# Patient Record
Sex: Male | Born: 1960
Health system: Southern US, Community
[De-identification: ages and names within clinical notes are randomized; demographics above are authoritative.]

## PROBLEM LIST (undated history)

## (undated) ENCOUNTER — Inpatient Hospital Stay: Admission: EM | Payer: Self-pay | Source: Home / Self Care

## (undated) DIAGNOSIS — Z9889 Other specified postprocedural states: Secondary | ICD-10-CM

## (undated) DIAGNOSIS — J301 Allergic rhinitis due to pollen: Secondary | ICD-10-CM

## (undated) DIAGNOSIS — T7840XA Allergy, unspecified, initial encounter: Secondary | ICD-10-CM

## (undated) DIAGNOSIS — Z800 Family history of malignant neoplasm of digestive organs: Secondary | ICD-10-CM

## (undated) DIAGNOSIS — J449 Chronic obstructive pulmonary disease, unspecified: Secondary | ICD-10-CM

## (undated) DIAGNOSIS — R112 Nausea with vomiting, unspecified: Secondary | ICD-10-CM

## (undated) DIAGNOSIS — M1990 Unspecified osteoarthritis, unspecified site: Secondary | ICD-10-CM

## (undated) DIAGNOSIS — K921 Melena: Secondary | ICD-10-CM

## (undated) DIAGNOSIS — E119 Type 2 diabetes mellitus without complications: Secondary | ICD-10-CM

## (undated) DIAGNOSIS — Z803 Family history of malignant neoplasm of breast: Secondary | ICD-10-CM

## (undated) DIAGNOSIS — Z8739 Personal history of other diseases of the musculoskeletal system and connective tissue: Secondary | ICD-10-CM

## (undated) DIAGNOSIS — K219 Gastro-esophageal reflux disease without esophagitis: Secondary | ICD-10-CM

## (undated) DIAGNOSIS — G43909 Migraine, unspecified, not intractable, without status migrainosus: Secondary | ICD-10-CM

## (undated) DIAGNOSIS — Z860100 Personal history of colon polyps, unspecified: Secondary | ICD-10-CM

## (undated) HISTORY — DX: Family history of malignant neoplasm of breast: Z80.3

## (undated) HISTORY — DX: Type 2 diabetes mellitus without complications: E11.9

## (undated) HISTORY — DX: Allergic rhinitis due to pollen: J30.1

## (undated) HISTORY — DX: Migraine, unspecified, not intractable, without status migrainosus: G43.909

## (undated) HISTORY — DX: Personal history of colonic polyps: Z86.010

## (undated) HISTORY — PX: KNEE ARTHROSCOPY: SUR90

## (undated) HISTORY — PX: COLONOSCOPY: SHX174

## (undated) HISTORY — DX: Family history of malignant neoplasm of digestive organs: Z80.0

## (undated) HISTORY — PX: WISDOM TOOTH EXTRACTION: SHX21

## (undated) HISTORY — DX: Allergy, unspecified, initial encounter: T78.40XA

## (undated) HISTORY — DX: Gastro-esophageal reflux disease without esophagitis: K21.9

## (undated) HISTORY — DX: Personal history of colon polyps, unspecified: Z86.0100

## (undated) HISTORY — DX: Melena: K92.1

---

## 1982-06-26 HISTORY — PX: APPENDECTOMY: SHX54

## 2001-08-20 ENCOUNTER — Ambulatory Visit (HOSPITAL_COMMUNITY): Admission: RE | Admit: 2001-08-20 | Discharge: 2001-08-20 | Payer: Self-pay | Admitting: Internal Medicine

## 2003-07-23 ENCOUNTER — Emergency Department (HOSPITAL_COMMUNITY): Admission: EM | Admit: 2003-07-23 | Discharge: 2003-07-23 | Payer: Self-pay | Admitting: Emergency Medicine

## 2006-06-26 HISTORY — PX: NASAL SEPTUM SURGERY: SHX37

## 2008-12-16 ENCOUNTER — Encounter: Payer: Self-pay | Admitting: Family Medicine

## 2010-01-12 ENCOUNTER — Ambulatory Visit: Payer: Self-pay | Admitting: Family Medicine

## 2010-01-12 DIAGNOSIS — Z860100 Personal history of colon polyps, unspecified: Secondary | ICD-10-CM | POA: Insufficient documentation

## 2010-01-12 DIAGNOSIS — J45909 Unspecified asthma, uncomplicated: Secondary | ICD-10-CM | POA: Insufficient documentation

## 2010-01-12 DIAGNOSIS — K219 Gastro-esophageal reflux disease without esophagitis: Secondary | ICD-10-CM

## 2010-01-13 LAB — CONVERTED CEMR LAB
AST: 32 units/L (ref 0–37)
Albumin: 4.5 g/dL (ref 3.5–5.2)
Alkaline Phosphatase: 73 units/L (ref 39–117)
Basophils Relative: 2.1 % (ref 0.0–3.0)
CO2: 26 meq/L (ref 19–32)
Calcium: 9.4 mg/dL (ref 8.4–10.5)
Eosinophils Absolute: 0.2 10*3/uL (ref 0.0–0.7)
Glucose, Bld: 103 mg/dL — ABNORMAL HIGH (ref 70–99)
HCT: 46.1 % (ref 39.0–52.0)
Hemoglobin: 16 g/dL (ref 13.0–17.0)
Lymphocytes Relative: 27.9 % (ref 12.0–46.0)
Lymphs Abs: 1.4 10*3/uL (ref 0.7–4.0)
MCHC: 34.8 g/dL (ref 30.0–36.0)
Monocytes Relative: 9 % (ref 3.0–12.0)
Neutro Abs: 3 10*3/uL (ref 1.4–7.7)
Potassium: 4.5 meq/L (ref 3.5–5.1)
RBC: 5.11 M/uL (ref 4.22–5.81)
Sodium: 136 meq/L (ref 135–145)
TSH: 0.45 microintl units/mL (ref 0.35–5.50)
Total CHOL/HDL Ratio: 6
Total Protein: 7.5 g/dL (ref 6.0–8.3)
Triglycerides: 322 mg/dL — ABNORMAL HIGH (ref 0.0–149.0)

## 2010-02-24 ENCOUNTER — Encounter: Payer: Self-pay | Admitting: Internal Medicine

## 2010-02-25 ENCOUNTER — Ambulatory Visit: Payer: Self-pay | Admitting: Internal Medicine

## 2010-02-25 ENCOUNTER — Ambulatory Visit (HOSPITAL_COMMUNITY): Admission: RE | Admit: 2010-02-25 | Discharge: 2010-02-25 | Payer: Self-pay | Admitting: Internal Medicine

## 2010-03-01 ENCOUNTER — Encounter: Payer: Self-pay | Admitting: Internal Medicine

## 2010-07-26 NOTE — Letter (Signed)
Summary: Records from [facility]   Records from [facility]   Imported By: Maryln Gottron 02/02/2010 12:25:47  _____________________________________________________________________  External Attachment:    Type:   Image     Comment:   External Document

## 2010-07-26 NOTE — Letter (Signed)
Summary: Patient Notice, Colon Biopsy Results  [facility]  [address]   [place], [place] [address]   Phone: [phone]  Fax: [phone]       March 01, 2010   ABDULLA [name] [address] [place], [place]  [address] 02-Jan-1961    Dear [name],  I am pleased to inform you that the biopsies taken during your recent colonoscopy did not show any evidence of cancer upon pathologic examination.  Additional information/recommendations:  You should have a repeat colonoscopy examination  in 5 years.  Please call us if you are having persistent problems or have questions about your condition that have not been fully answered at this time.  Sincerely,    R. Roetta Sessions MD, FACP [facility] Ph: [phone]    Fax: [phone]   Appended Document: Patient Notice, Colon Biopsy Results Letter mailed to pt.  Appended Document: Patient Notice, Colon Biopsy Results Reminder placed in idx.

## 2010-07-26 NOTE — Assessment & Plan Note (Signed)
Summary: BRAND NEW PT/TO EST/PT REQ CPX/COMING IN FASTING/CJR   Vital Signs:  Patient profile:   50 year old male Height:      73.25 inches Weight:      306 pounds BMI:     40.24 Temp:     97.8 degrees F oral Pulse rate:   80 / minute Pulse rhythm:   regular Resp:     12 per minute BP sitting:   120 / 88  (left arm) Cuff size:   large  Vitals Entered By: Sid Falcon LPN (January 12, 2010 10:26 AM)  Nutrition Counseling: Patient's BMI is greater than 25 and therefore counseled on weight management options. CC: New to establish, fasting for labs   History of Present Illness: Patient here to establish care and for complete physical examination.  Past medical history reviewed. History of rare migraine headaches. Reported history of colon polyps. Positive family history of colon cancer in his mother. History of reflux and he takes omeprazole irregularly. Reported history of asthma in childhood. Possibly mild intermittent asthma now. Prior appendectomy 1984. Seasonal allergic rhinitis.  Family history significant for mother colon cancer and breast cancer. Father with type 2 diabetes.  Patient is divorced. Nonsmoker. Occasional alcohol use. Works in [employer]  Alcohol-Tobacco     Smoking Status: quit > 6 months     Year Started: 1975     Year Quit: 1985  Caffeine-Diet-Exercise     Does Patient Exercise: no  Allergies (verified): No Known Drug Allergies  Past History:  Family History: Last updated: 01/12/2010 Mother colon CA, breast CA Maternal grandmother breast CA, colon CA Mothers brothers 6 out of 8 died of colon CA Father diabetes, type ll Paternal grandfather, diabetes brother, type ll diabetes  Social History: Last updated: 01/12/2010 Occupation: Divorced Alcohol use-yes Regular exercise-no past smoker  Risk Factors: Exercise: no (01/12/2010)  Risk Factors: Smoking Status: quit > 6 months  (01/12/2010)  Past Medical History: Asthma Colonic polyps, hx of GERD blood in stool hay fever/allergies migraines  Past Surgical History: Appendectomy  1984 Deviated septum 2008 PMH-FH-SH reviewed for relevance  Family History: Mother colon CA, breast CA Maternal grandmother breast CA, colon CA Mothers brothers 6 out of 8 died of colon CA Father diabetes, type ll Paternal grandfather, diabetes brother, type ll diabetes  Social History: Occupation: Divorced Alcohol use-yes Regular exercise-no past smoker Occupation:  employed Does Patient Exercise:  no Smoking Status:  quit > 6 months  Review of Systems  The patient denies anorexia, fever, weight loss, weight gain, vision loss, decreased hearing, hoarseness, chest pain, syncope, dyspnea on exertion, peripheral edema, prolonged cough, headaches, hemoptysis, abdominal pain, melena, hematochezia, severe indigestion/heartburn, hematuria, incontinence, genital sores, muscle weakness, suspicious skin lesions, transient blindness, difficulty walking, depression, unusual weight change, abnormal bleeding, enlarged lymph nodes, and testicular masses.         patient has some daytime fatigue. Frequent symptoms of dribbling urinary stream. Some nocturia. Frequent knee pain.  Physical Exam  General:  Well-developed,well-nourished,in no acute distress; alert,appropriate and cooperative throughout examination Head:  Normocephalic and atraumatic without obvious abnormalities. No apparent alopecia or balding. Eyes:  No corneal or conjunctival inflammation noted. EOMI. Perrla. Funduscopic exam benign, without hemorrhages, exudates or papilledema. Vision grossly normal. Ears:  External ear exam shows no significant lesions or deformities.  Otoscopic examination reveals clear canals, tympanic membranes are intact bilaterally without bulging, retraction, inflammation or discharge. Hearing is grossly normal bilaterally. Mouth:  Oral mucosa and  oropharynx without lesions or exudates.  Teeth in good repair. Neck:  No deformities, masses, or tenderness noted. Lungs:  Normal respiratory effort, chest expands symmetrically. Lungs are clear to auscultation, no crackles or wheezes. Heart:  Normal rate and regular rhythm. S1 and S2 normal without gallop, murmur, click, rub or other extra sounds. Abdomen:  Bowel sounds positive,abdomen soft and non-tender without masses, organomegaly or hernias noted. Rectal:  No external abnormalities noted. Normal sphincter tone. No rectal masses or tenderness. Prostate:  Prostate gland firm and smooth, no enlargement, nodularity, tenderness, mass, asymmetry or induration. Extremities:  No clubbing, cyanosis, edema, or deformity noted with normal full range of motion of all joints.   Neurologic:  alert & oriented X3, cranial nerves II-XII intact, strength normal in all extremities, and gait normal.   Skin:  multiple skin tags both axillary regions Cervical Nodes:  No lymphadenopathy noted Psych:  normally interactive, good eye contact, not anxious appearing, and not depressed appearing.     Impression & Recommendations:  Problem # 1:  Preventive Health Care (ICD-V70.0) obtain screening labs.  Work on weight loss.  Needs repeat colonoscopy (>5 years and VERY strong FH). Pt to schedule follow up for skin tag removal.  Complete Medication List: 1)  Omeprazole 40 Mg Cpdr (Omeprazole) .... One tab two times a day 2)  Allegra 180 Mg Tabs (Fexofenadine hcl) .... Once daily 3)  Fluticasone Propionate 50 Mcg/act Susp (Fluticasone propionate) .... Two puffs two times a day  Other Orders: TLB-Lipid Panel (80061-LIPID) TLB-BMP (Basic Metabolic Panel-BMET) (80048-METABOL) TLB-CBC Platelet - w/Differential (85025-CBCD) TLB-Hepatic/Liver Function Pnl (80076-HEPATIC) TLB-TSH (Thyroid Stimulating Hormone) (84443-TSH) TLB-PSA (Prostate Specific Antigen) (84153-PSA) Gastroenterology Referral (GI) Venipuncture  ([address]109) Specimen Handling (60454)  Patient Instructions: 1)  It is important that you exercise reguarly at least 20 minutes 5 times a week. If you develop chest pain, have severe difficulty breathing, or feel very tired, stop exercising immediately and seek medical attention.  2)  You need to lose weight. Consider a lower calorie diet and regular exercise.  3)  Schedule followup for skin tag removal  Preventive Care Screening  Last Tetanus Booster:    Date:  06/26/2006    Results:  Historical   Colonoscopy:    Date:  06/27/2003    Results:  Adenomatous Polyp

## 2010-07-26 NOTE — Letter (Signed)
Summary: [facility]  [facility]   Imported By: Cloria Spring LPN 04/54/0981 19:14:78  _____________________________________________________________________  External Attachment:    Type:   Image     Comment:   External Document

## 2010-10-21 ENCOUNTER — Encounter: Payer: Self-pay | Admitting: Family Medicine

## 2010-10-21 ENCOUNTER — Ambulatory Visit (INDEPENDENT_AMBULATORY_CARE_PROVIDER_SITE_OTHER): Payer: 59 | Admitting: Family Medicine

## 2010-10-21 VITALS — BP 122/86 | Temp 98.2°F | Wt 314.0 lb

## 2010-10-21 DIAGNOSIS — R1032 Left lower quadrant pain: Secondary | ICD-10-CM

## 2010-10-21 LAB — POCT URINALYSIS DIPSTICK
Bilirubin, UA: NEGATIVE
Blood, UA: NEGATIVE
Glucose, UA: NEGATIVE
Spec Grav, UA: 1.025

## 2010-10-21 LAB — CBC WITH DIFFERENTIAL/PLATELET
Basophils Relative: 1.7 % (ref 0.0–3.0)
Eosinophils Absolute: 0.2 10*3/uL (ref 0.0–0.7)
HCT: 42.8 % (ref 39.0–52.0)
Hemoglobin: 15 g/dL (ref 13.0–17.0)
Lymphs Abs: 1.6 10*3/uL (ref 0.7–4.0)
MCHC: 35.1 g/dL (ref 30.0–36.0)
MCV: 89.8 fl (ref 78.0–100.0)
Monocytes Absolute: 0.7 10*3/uL (ref 0.1–1.0)
Neutro Abs: 4.5 10*3/uL (ref 1.4–7.7)
RBC: 4.77 Mil/uL (ref 4.22–5.81)

## 2010-10-21 MED ORDER — CIPROFLOXACIN HCL 500 MG PO TABS: 500.0000 mg | ORAL_TABLET | Freq: Two times a day (BID) | ORAL | Status: AC

## 2010-10-21 MED ORDER — OMEPRAZOLE 40 MG PO CPDR: 40.0000 mg | DELAYED_RELEASE_CAPSULE | Freq: Every day | ORAL | Status: DC

## 2010-10-21 MED ORDER — METRONIDAZOLE 500 MG PO TABS: 500.0000 mg | ORAL_TABLET | Freq: Three times a day (TID) | ORAL | Status: AC

## 2010-10-21 NOTE — Progress Notes (Signed)
  Subjective:    Patient ID: [name] [name], male    DOB: 05/26/1961, 50 y.o.   MRN: [id number]  HPI Patient seen with abdominal pain left mid to lower quadrant and started a few days ago. Somewhat progressive. Had colonoscopy last September with some diverticular changes. No prior history of diverticulitis. He has some generalized soreness of moderate severity. No exacerbating features. Had low-grade fever yesterday by palpation but did not take temperature. Had occasional chills. No vomiting or diarrhea. No stool changes. No bloody stools.  Also his history of GERD. Requests refills Prilosec. Not currently on Prilosec and has had recurrent symptoms off medication. Denies any recent appetite or weight changes.   Review of Systems  Constitutional: Positive for fever, chills and fatigue.  Respiratory: Negative for cough and shortness of breath.   Cardiovascular: Negative for chest pain, palpitations and leg swelling.  Gastrointestinal: Positive for abdominal pain. Negative for nausea, vomiting, diarrhea, constipation, blood in stool and rectal pain.  Genitourinary: Negative for dysuria and hematuria.       Objective:   Physical Exam  Constitutional: He appears well-developed and well-nourished. No distress.  HENT:  Head: Normocephalic and atraumatic.  Mouth/Throat: Oropharynx is clear and moist. No oropharyngeal exudate.  Cardiovascular: Normal rate and normal heart sounds.   No murmur heard. Pulmonary/Chest: Effort normal and breath sounds normal. No respiratory distress. He has no wheezes. He has no rales.  Abdominal: Soft. Bowel sounds are normal. He exhibits no distension and no mass. There is tenderness. There is no rebound and no guarding.       Mild tenderness left mid to lower quadrant. No guarding or rebound.  Musculoskeletal: He exhibits no edema.          Assessment & Plan:  New onset left mid to lower quadrant abdominal pain. Suspect acute diverticulitis. Check CBC  and UA. Start Flagyl 500 mg 3 times a day and Cipro 500 mg twice a day for 10 days. Follow up promptly for any worsening pain, fever, or any new symptoms

## 2010-10-21 NOTE — Patient Instructions (Signed)
Follow up promptly for any worsening abdominal pain, vomiting, or increased fever.

## 2010-10-25 NOTE — Progress Notes (Signed)
Quick Note:  Pt informed ______ 

## 2011-05-23 ENCOUNTER — Encounter: Payer: Self-pay | Admitting: Family Medicine

## 2011-05-24 ENCOUNTER — Ambulatory Visit (INDEPENDENT_AMBULATORY_CARE_PROVIDER_SITE_OTHER): Payer: 59 | Admitting: Family Medicine

## 2011-05-24 ENCOUNTER — Encounter: Payer: Self-pay | Admitting: Family Medicine

## 2011-05-24 DIAGNOSIS — R05 Cough: Secondary | ICD-10-CM

## 2011-05-24 DIAGNOSIS — J309 Allergic rhinitis, unspecified: Secondary | ICD-10-CM

## 2011-05-24 MED ORDER — AZELASTINE HCL 0.1 % NA SOLN: 1.0000 | Freq: Two times a day (BID) | NASAL | Status: DC

## 2011-05-24 MED ORDER — MONTELUKAST SODIUM 10 MG PO TABS: 10.0000 mg | ORAL_TABLET | Freq: Every day | ORAL | Status: DC

## 2011-05-24 NOTE — Progress Notes (Addendum)
  Subjective:    Patient ID: [name] [name], male    DOB: 08/09/1960, 50 y.o.   MRN: [id number]  HPI  Persistent cough and nasal congestion. History of allergic rhinitis. Takes Astelin and Singulair which generally control allergy symptoms. Has taken antihistamines previously without much success and also nasal steroids without much improvement. One-month history of mostly dry cough. No fever. Possible wheezing at night. Minimal if any postnasal drip symptoms. No GERD symptoms. Cough worse at night. Feels chest tightening with cough at night. No pleuritic pain. No hemoptysis. No appetite or weight changes. Nonsmoker.  Past Medical History  Diagnosis Date  . Asthma   . Hx of colonic polyps   . GERD (gastroesophageal reflux disease)   . Blood in stool   . Hay fever   . Migraines    Past Surgical History  Procedure Date  . Appendectomy 1984  . Nasal septum surgery 2008    reports that he has quit smoking. He does not have any smokeless tobacco history on file. He reports that he drinks alcohol. He reports that he does not use illicit drugs. family history includes Cancer in his maternal grandmother, maternal uncles, and mother and Diabetes in his brother, father, and paternal grandfather. No Known Allergies    Review of Systems  Constitutional: Negative for fever, chills, appetite change, fatigue and unexpected weight change.  HENT: Positive for congestion.   Respiratory: Positive for cough and wheezing. Negative for shortness of breath.   Cardiovascular: Negative for chest pain.  Neurological: Negative for dizziness and syncope.  Hematological: Negative for adenopathy.       Objective:   Physical Exam  Constitutional: He appears well-developed and well-nourished. No distress.  HENT:  Right Ear: External ear normal.  Left Ear: External ear normal.  Mouth/Throat: Oropharynx is clear and moist.  Neck: Neck supple.  Cardiovascular: Normal rate and regular rhythm.     Pulmonary/Chest: Effort normal and breath sounds normal. No respiratory distress. He has no wheezes. He has no rales.  Musculoskeletal: He exhibits no edema.  Lymphadenopathy:    He has no cervical adenopathy.          Assessment & Plan:  Persistent cough. Differential includes post viral bronchitis, allergy-related, or possible reactive airway component though normal lung exam at this time.  Sample of Ventolin inhaler 2 puffs every 4 hours as needed. Continue Mucinex. Continue Astelin and Singulair. Consider possibly short-term use of steroid inhaler if no improvement in 1-2 weeks.  Pulse oximetry 97% room air Allergic rhinitis. Refilled Singulair and Astelin

## 2011-08-16 ENCOUNTER — Encounter: Payer: Self-pay | Admitting: Family Medicine

## 2011-08-16 ENCOUNTER — Ambulatory Visit (INDEPENDENT_AMBULATORY_CARE_PROVIDER_SITE_OTHER): Payer: 59 | Admitting: Family Medicine

## 2011-08-16 VITALS — BP 115/68 | Temp 98.6°F | Wt 316.0 lb

## 2011-08-16 DIAGNOSIS — J069 Acute upper respiratory infection, unspecified: Secondary | ICD-10-CM

## 2011-08-16 DIAGNOSIS — R05 Cough: Secondary | ICD-10-CM

## 2011-08-16 DIAGNOSIS — R062 Wheezing: Secondary | ICD-10-CM

## 2011-08-16 MED ORDER — AZITHROMYCIN 250 MG PO TABS: ORAL_TABLET | ORAL | Status: AC

## 2011-08-16 NOTE — Progress Notes (Signed)
  Subjective:    Patient ID: [name] [name], male    DOB: 12-15-1960, 51 y.o.   MRN: [id number]  HPI  Patient is seen with cold-like symptoms over the past week. He relates cough off and on for several months. Especially at night.  Almost daily wheezing and dyspnea with activity.  No chest pain.  Never diagnosed with asthma. Recently had type of screening (spirometry) in retail store. He brings a copy. FEV1 48% predicted with FVC 78% predicted. He has significant nasal congestion and greenish nasal mucus and productive cough. He is planning to get married this Saturday. No body aches. No fever. Intermittent sore throat. Has taken over-the-counter Sudafed without much relief.  Past Medical History  Diagnosis Date  . Asthma   . Hx of colonic polyps   . GERD (gastroesophageal reflux disease)   . Blood in stool   . Hay fever   . Migraines    Past Surgical History  Procedure Date  . Appendectomy 1984  . Nasal septum surgery 2008    reports that he has quit smoking. He does not have any smokeless tobacco history on file. He reports that he drinks alcohol. He reports that he does not use illicit drugs. family history includes Cancer in his maternal grandmother, maternal uncles, and mother and Diabetes in his brother, father, and paternal grandfather. No Known Allergies    Review of Systems  Constitutional: Negative for fever, chills and unexpected weight change.  HENT: Positive for congestion and sore throat.   Respiratory: Positive for cough, shortness of breath and wheezing.   Cardiovascular: Negative for chest pain, palpitations and leg swelling.  Gastrointestinal: Negative for abdominal pain.  Skin: Negative for rash.  Neurological: Negative for dizziness and headaches.       Objective:   Physical Exam  Constitutional: He appears well-developed and well-nourished.  HENT:  Right Ear: External ear normal.  Left Ear: External ear normal.  Mouth/Throat: Oropharynx is clear  and moist.  Neck: Neck supple.  Cardiovascular: Normal rate and regular rhythm.   Pulmonary/Chest: Effort normal and breath sounds normal. No respiratory distress. He has no wheezes. He has no rales.  Musculoskeletal: He exhibits no edema.  Lymphadenopathy:    He has no cervical adenopathy.          Assessment & Plan:  #1 URI. Continue Sudafed. Continue plenty of fluids.  Progressive facial pain and productive cough. Start Zithromax #2 chronic cough. Probably secondary to reactive airway disease. Diminished FEV1 by recent testing as above. Start Qvar 80 mcg one puff twice daily and rinse after use. Reassess in one month and repeat spirometry then

## 2011-09-13 ENCOUNTER — Ambulatory Visit (INDEPENDENT_AMBULATORY_CARE_PROVIDER_SITE_OTHER): Payer: 59 | Admitting: Family Medicine

## 2011-09-13 ENCOUNTER — Encounter: Payer: Self-pay | Admitting: Family Medicine

## 2011-09-13 DIAGNOSIS — J45909 Unspecified asthma, uncomplicated: Secondary | ICD-10-CM | POA: Insufficient documentation

## 2011-09-13 MED ORDER — BECLOMETHASONE DIPROPIONATE 80 MCG/ACT IN AERS: 2.0000 | INHALATION_SPRAY | Freq: Two times a day (BID) | RESPIRATORY_TRACT | Status: DC

## 2011-09-13 NOTE — Progress Notes (Signed)
  Subjective:    Patient ID: [name] [name], male    DOB: [birth year], 51 y.o.   MRN: [id number]  HPI  Followup from visit one month ago. Patient had some chronic cough and wheezing mostly at night and almost nightly. He had screening spirometry outside this office with FEV1 of 48%. We started Qvar 80 mcg 2 puffs twice daily. His cough is resolved fully and is not wheezing any at this point. Less dyspnea with activity. No side effects. Continues to take anti-allergy medications as well. Nonsmoker.   Review of Systems  Constitutional: Negative for fever, appetite change and unexpected weight change.  Respiratory: Negative for cough, shortness of breath and wheezing.   Cardiovascular: Negative for chest pain.       Objective:   Physical Exam  Constitutional: He appears well-developed and well-nourished.  Neck: Neck supple. No thyromegaly present.  Cardiovascular: Normal rate and regular rhythm.   Pulmonary/Chest: Effort normal and breath sounds normal. No respiratory distress. He has no wheezes. He has no rales.  Musculoskeletal: He exhibits no edema.  Lymphadenopathy:    He has no cervical adenopathy.          Assessment & Plan:  Reactive airway disease. Symptomatically greatly improved with steroid inhaler. Repeat spirometry today. Continue Qvar and reminder for rinsing mouth after use.  Repeat spirometry today reveals FEV1 83% predicted which is greatly improved from 48%.

## 2011-12-19 ENCOUNTER — Encounter: Payer: Self-pay | Admitting: Family Medicine

## 2011-12-19 ENCOUNTER — Ambulatory Visit (INDEPENDENT_AMBULATORY_CARE_PROVIDER_SITE_OTHER): Payer: 59 | Admitting: Family Medicine

## 2011-12-19 ENCOUNTER — Ambulatory Visit (INDEPENDENT_AMBULATORY_CARE_PROVIDER_SITE_OTHER)
Admission: RE | Admit: 2011-12-19 | Discharge: 2011-12-19 | Disposition: A | Discharge status: Home / Self Care | Payer: 59 | Source: Ambulatory Visit | Attending: Family Medicine | Admitting: Family Medicine

## 2011-12-19 VITALS — BP 122/88 | Temp 97.8°F | Wt 312.0 lb

## 2011-12-19 DIAGNOSIS — M5412 Radiculopathy, cervical region: Secondary | ICD-10-CM

## 2011-12-19 MED ORDER — OXYCODONE-ACETAMINOPHEN 5-325 MG PO TABS: 1.0000 | ORAL_TABLET | Freq: Four times a day (QID) | ORAL | Status: DC | PRN

## 2011-12-19 MED ORDER — PREDNISONE 10 MG PO TABS: ORAL_TABLET | ORAL | Status: DC

## 2011-12-19 NOTE — Progress Notes (Signed)
  Subjective:    Patient ID: [name] [name], male    DOB: 05/04/1961, 51 y.o.   MRN: [id number]  HPI  Six-week history of progressive back pain. Initially had some lumbar back pain but more recently cervical and upper back pain. He has frequent sharp pain which radiates down left upper extremity with some intermittent numbness and tingling sensation left upper extremity all the way to the hand. Possibly some mild weakness.  Saw chiropractor but no prior x-rays. Had deep tissue massage, stretches, and TENS unit without any improvement. Pain is becoming progressive as well. No relief with over-the-counter medications. Has symptoms of shocklike sensation left upper extremity.  Past Medical History  Diagnosis Date  . Asthma   . Hx of colonic polyps   . GERD (gastroesophageal reflux disease)   . Blood in stool   . Hay fever   . Migraines    Past Surgical History  Procedure Date  . Appendectomy 1984  . Nasal septum surgery 2008    reports that he has quit smoking. He does not have any smokeless tobacco history on file. He reports that he drinks alcohol. He reports that he does not use illicit drugs. family history includes Cancer in his maternal grandmother, maternal uncles, and mother and Diabetes in his brother, father, and paternal grandfather. No Known Allergies    Review of Systems  HENT: Positive for neck pain.   Neurological: Positive for weakness and numbness.       Objective:   Physical Exam  Constitutional: He appears well-developed and well-nourished.  Cardiovascular: Normal rate and regular rhythm.   Pulmonary/Chest: Effort normal and breath sounds normal. No respiratory distress. He has no wheezes. He has no rales.  Musculoskeletal: He exhibits no edema.       No upper extremity atrophy. Full-strength. Full range of motion left shoulder. He has some pain with extension of neck and extreme flexion  Neurological:       Deep tendon reflexes symmetric upper  extremities. No sensory impairment. No definite weakness.          Assessment & Plan:  Left cervical radiculopathy pain. Nonfocal exam. Prednisone taper. Cervical spine films given duration of symptoms over 6 weeks. Limited Percocet 5 mg one every 6 hours as needed #30 with no refill. May need cervical MRI to further assess. Reassess in 2 weeks

## 2011-12-19 NOTE — Progress Notes (Signed)
Quick Note:  Pt informed on VM ______ 

## 2011-12-25 ENCOUNTER — Telehealth: Payer: Self-pay | Admitting: Family Medicine

## 2011-12-25 DIAGNOSIS — M5412 Radiculopathy, cervical region: Secondary | ICD-10-CM

## 2011-12-25 NOTE — Telephone Encounter (Signed)
Yes.  OK to see Dr Venetia Maxon.  May need MRI but OK to set up referral.

## 2011-12-25 NOTE — Telephone Encounter (Signed)
Pt was in to see Dr. Caryl Never for back pain. Discussed possible referral. He would like a referral to Dr. Maeola Harman. Wants to know if this is ok with Dr. Caryl Never. If so, please put in referral and have Terri call.

## 2011-12-26 NOTE — Telephone Encounter (Signed)
Patient is already taking Ibuprofen any other suggestions?

## 2011-12-26 NOTE — Telephone Encounter (Signed)
We can refill Percocet if needed.  He may take up to 2 tablets every 6 hours for severe pain.  May refill #40 until f/u with neurosurgeon.

## 2011-12-26 NOTE — Telephone Encounter (Signed)
I did set up referral and informed pt.  He has been experiencing increased sx and pain, "I've not slept 2-3 nights".  He has Percocet and it does help, however he is taking them sparingly as he only got a few. " Any suggestions for something to help me sleep"?

## 2011-12-26 NOTE — Telephone Encounter (Signed)
If not already doing, could supplement with Ibuprofen up to 800 mg q 8 hours prn- if able to tolerate.

## 2011-12-27 MED ORDER — OXYCODONE-ACETAMINOPHEN 5-325 MG PO TABS: 2.0000 | ORAL_TABLET | Freq: Four times a day (QID) | ORAL | Status: AC | PRN

## 2012-01-09 ENCOUNTER — Ambulatory Visit: Payer: 59 | Admitting: Family Medicine

## 2012-02-06 ENCOUNTER — Encounter (HOSPITAL_COMMUNITY): Payer: Self-pay | Admitting: Pharmacy Technician

## 2012-02-06 ENCOUNTER — Other Ambulatory Visit: Payer: Self-pay | Admitting: Neurosurgery

## 2012-02-15 ENCOUNTER — Other Ambulatory Visit (HOSPITAL_COMMUNITY): Payer: 59

## 2012-02-16 ENCOUNTER — Ambulatory Visit (HOSPITAL_COMMUNITY)
Admission: RE | Admit: 2012-02-16 | Discharge: 2012-02-16 | Disposition: A | Discharge status: Home / Self Care | Payer: 59 | Source: Ambulatory Visit | Attending: Neurosurgery | Admitting: Neurosurgery

## 2012-02-16 ENCOUNTER — Encounter (HOSPITAL_COMMUNITY)
Admission: RE | Admit: 2012-02-16 | Discharge: 2012-02-16 | Disposition: A | Discharge status: Home / Self Care | Payer: 59 | Source: Ambulatory Visit | Attending: Neurosurgery | Admitting: Neurosurgery

## 2012-02-16 ENCOUNTER — Encounter (HOSPITAL_COMMUNITY): Payer: Self-pay

## 2012-02-16 DIAGNOSIS — J4489 Other specified chronic obstructive pulmonary disease: Secondary | ICD-10-CM | POA: Insufficient documentation

## 2012-02-16 DIAGNOSIS — Z01810 Encounter for preprocedural cardiovascular examination: Secondary | ICD-10-CM | POA: Insufficient documentation

## 2012-02-16 DIAGNOSIS — J449 Chronic obstructive pulmonary disease, unspecified: Secondary | ICD-10-CM | POA: Insufficient documentation

## 2012-02-16 DIAGNOSIS — Z01812 Encounter for preprocedural laboratory examination: Secondary | ICD-10-CM | POA: Insufficient documentation

## 2012-02-16 DIAGNOSIS — Z01818 Encounter for other preprocedural examination: Secondary | ICD-10-CM | POA: Insufficient documentation

## 2012-02-16 HISTORY — DX: Chronic obstructive pulmonary disease, unspecified: J44.9

## 2012-02-16 HISTORY — DX: Other specified postprocedural states: R11.2

## 2012-02-16 HISTORY — DX: Unspecified osteoarthritis, unspecified site: M19.90

## 2012-02-16 HISTORY — DX: Other specified postprocedural states: Z98.890

## 2012-02-16 LAB — CBC
Hemoglobin: 15.6 g/dL (ref 13.0–17.0)
MCH: 30.2 pg (ref 26.0–34.0)
MCHC: 35 g/dL (ref 30.0–36.0)
MCV: 86.3 fL (ref 78.0–100.0)

## 2012-02-16 LAB — SURGICAL PCR SCREEN: MRSA, PCR: NEGATIVE

## 2012-02-16 NOTE — Pre-Procedure Instructions (Signed)
20 [name] [name]  02/16/2012   Your procedure is scheduled on:  Tuesday August 27  Report to [facility] at 11:15 AM.  Call this number if you have problems the morning of surgery: [phone]   Remember:   Do not eat or drink:After Midnight.    Take these medicines the morning of surgery with A SIP OF WATER: Allegra, Prilosec (omeprazole) if needed, Percocet (pain med) if needed. May use Inhaler and nasal spray.  Stop taking aspirin, Coumadin, Plavix, Effient, and herbal medications today.    Do not wear jewelry, make-up or nail polish.  Do not wear lotions, powders, or perfumes. You may wear deodorant.  Do not shave 48 hours prior to surgery. Men may shave face and neck.  Do not bring valuables to the hospital.  Contacts, dentures or bridgework may not be worn into surgery.  Leave suitcase in the car. After surgery it may be brought to your room.  For patients admitted to the hospital, checkout time is 11:00 AM the day of discharge.   Patients discharged the day of surgery will not be allowed to drive home.  Name and phone number of your driver: NA  Special Instructions: CHG Shower Use Special Wash: 1/2 bottle night before surgery and 1/2 bottle morning of surgery.   Please read over the following fact sheets that you were given: Pain Booklet, Coughing and Deep Breathing and Surgical Site Infection Prevention

## 2012-02-19 MED ORDER — DEXTROSE 5 % IV SOLN
3.0000 g | INTRAVENOUS | Status: AC
  Administered 2012-02-20: 3 g via INTRAVENOUS
  Filled 2012-02-19: qty 3000

## 2012-02-19 NOTE — H&P (Signed)
[name] [name]  #[id number]  DOB:  Dec 08, 1960  02/05/2012:  Marcial Pacas Melott returns today with his wife to review his situation.  He is no better in terms of pain relief with physical therapy.  He said physical therapy did help him with range of motion, but his pain persists.  He took only one Valium and he was scared of taking more than that.  He used [employer] which he says helps him and he uses them twice a day.  He ran out of Percocet that made him sleepy, but did not relieve all of his pain.    His motor examination is exactly the same as it was at the time of his visit with left triceps weakness and left wrist flexion weakness.  He has a positive Spurling maneuver to the left.    I reviewed his MRI and plain radiographs which show a spondylitic spur causing foraminal stenosis at C6-7 on the left.  This was interpreted by the radiologist as a mild disc bulge, but on further evaluation and review of sagittal images, there is clear occlusion of the neural foramen fairly laterally which is causing significant nerve root compression.    I have recommended based on the persistence of his symptoms and failure to improve with conservative management and persistent weakness that he undergo surgery.  This would consist of an anterior cervical decompression and fusion at the C6-7 level.  He will be fitted for a soft cervical collar.  He wishes to go ahead with surgery as soon as possible.  He did not do well with the Percocet and we are going to try Hydromorphone for pain relief.  Risks and benefits were discussed with the patient and he met with Georgiann Cocker who will review his surgery.  I gave him a booklet on surgery and also fitted him for a soft cervical collar.          Danae Orleans. Venetia Maxon, M.D./sv  Ziyad Dyar  #[id number]  DOB:  11-12-1960  01/01/2012:  Marcial Pacas Guinn returns today after his MRI of his cervical spine which shows small broad-based disc protrusions slightly asymmetric to the  left at C6-7 without focal nerve root impingement.  I am not certain that this is correct as he may have some foraminal stenosis on this level and is somewhat limited due to the patient's large body habitus and also the radiologist's interpretation there was some motion.   He worked with a Land and did not get a great deal of relief, but based on this MRI, I would like to see if he can do better with some physical therapy including home traction.  He is going to try that for the next 3 to 4 weeks, come back and see me in 3 to 4 weeks and he is going to work from home.          Danae Orleans. 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  He describes the pain in his left arm as unrelenting and it is far worse in the arm than in the neck. He says that he is not able to get any relief.  He is not able to sleep.  He notes  pain under his left shoulder blade to his left fourth and fifth digits. He does feel that he is weak in his left arm. He says this began on 11/14/2011 and he is not sure he can stand it much longer.  He says it is difficult to focus because of the pain.  He describes that his left biceps feels bruised. He describes the pain is at 7/10 at its worst and 4/10 at its best, but never really goes away.  He says that he was sitting in a chair at work and after about 30 minutes he began to get pain into his left shoulder and left arm.  He is taking Oxycodone 5/325 one after work, one at supper, and one at night, and using Lidoderm patches 5% two per day and he says this helps him more than anything, but the pain medicine does not really help him much.  He does not snoring at night. His wife says that he stops breathing at night.   He is in the process of getting evaluated for sleep apnea and is going to mention that to his primary physician.  He has been taking Aleve three in the morning, Ibuprofen a few twice daily.  He said he took a dosepak which only helped him for the first couple of days. He went to a chiropractor, has undergone massage and TENS unit.  He says none of these give him a great deal of relief.    REVIEW OF SYSTEMS:   A detailed Review of Systems sheet was reviewed with the patient.  Pertinent positives include under musculoskeletal - he notes arm weakness, arm pain, and neck pain.  All other systems are negative; this includes Constitutional symptoms, Eyes, Cardiovascular, Ears, nose, mouth, throat, Endocrine, Respiratory, Gastrointestinal, Genitourinary, Integumentary & Breast, Neurologic, Psychiatric, Hematologic/Lymphatic, Allergic/Immunologic.    PAST MEDICAL HISTORY:      Current Medical Conditions:    Significant for GERD, COPD, and asthma.      Prior Operations and Hospitalizations:   He had an appendectomy in 1984, nasal septum surgery in 2008.    Medications and Allergies:  He denies drug allergies.  Medications are Omeprazole 40 mg. daily for reflux, Montelukast 10 mg. daily for allergies, Patanase 665 mcg. two times daily for allergies, Qvar 80 mcg. daily for COPD, Oxycodone/Acetaminophen 5/325 as needed for neck pain, Lidoderm patches, Aleve, and Ibuprofen.      Height and Weight:     He is currently 6' tall and 312 lbs.  His BMI is 42.3.  FAMILY HISTORY:    Noncontributory.    SOCIAL HISTORY:    He does note a history in the past of smoking, but no longer smokes.  He denies drug use. He is a rare drinker of alcoholic beverages.    DIAGNOSTIC STUDIES:   He had cervical radiographs in the office today which show spondylosis and left-sided foraminal stenosis at C6-7.  The other foramina on the left appear to be patent and similarly on the right side of his neck.  He has a limited excursion with  flexion and extension views.    PHYSICAL EXAMINATION:      General Appearance:   On examination today, Mr. Masini is a pleasant and cooperative, obese man in no acute distress.     HEENT - normocephalic, atraumatic.  The pupils are equal, round and reactive to light.  The extraocular muscles are intact.  Sclerae - white.  Conjunctiva - pink.  Oropharynx benign.  Uvula midline.  Neck - there are no masses, meningismus, deformities, tracheal deviation, jugular vein distention or carotid bruits.  There is normal cervical range of motion.  He keeps his head turned to the right.  He has significant pain when he turns his head to the left.  He has a positive Spurlings' maneuver to the left and positive Lhermitte's sign with axial compression causing left arm pain.      Respiratory - there is normal respiratory effort with good intercostal function.  Lungs are clear to auscultation.  There are no rales, rhonchi or wheezes.      Cardiovascular - the heart has regular rate and rhythm to auscultation.  No murmurs are appreciated.  There is no extremity edema, cyanosis or clubbing.  There are palpable pedal pulses.   Abdomen - obese, soft, nontender, no hepatosplenomegaly appreciated or masses.  There are active bowel sounds.  No guarding or rebound.      Musculoskeletal Examination - he has pain and parascapular pain to the left and keeps his neck flexed.  He has difficulty extending his neck and has significant pain when he tries to do so.    Negative Tinel's and Phalen's signs at both wrists.    NEUROLOGICAL EXAMINATION: The patient is oriented to time, person and place and has good recall of both recent and remote memory with normal attention span and concentration.  The patient speaks with clear and fluent speech and exhibits normal language function and appropriate fund of knowledge.      Cranial Nerve Examination - pupils are equal, round and reactive to light.  Extraocular movements are full.   Visual fields are full to confrontational testing.  Facial sensation and facial movement are symmetric and intact.  Hearing is intact to finger rub.  Palate is upgoing.  Shoulder shrug is symmetric.  Tongue protrudes in the midline.      Motor Examination - motor strength is 5/5 in the bilateral deltoids, biceps, handgrips, wrist extensors, interosseous, 4/5 left triceps strength, 5/5 right triceps, 4/5 left wrist flexion strength.  In the lower extremities motor strength is 5/5 in hip flexion, extension, quadriceps, hamstrings, plantar flexion, dorsiflexion and extensor hallucis longus.      Sensory Examination - he has decreased pin sensation in his left first and second digits.     Deep Tendon Reflexes - trace in the biceps, triceps, and brachioradialis, trace in the knees, trace in the ankles.  The great toes are downgoing to plantar stimulation.    Cerebellar Examination - normal coordination in upper and lower extremities and normal rapid alternating movements.  Romberg test is negative.    IMPRESSION AND RECOMMENDATIONS: Heston Widener is a 51 year old man with a left C7 radiculopathy on examination. He has significant left arm pain and weakness.  He has a C7 radiculopathy on physical examination and has spondylosis at C6-7 on the left.   I have recommended we get an expedited MRI of his cervical spine. I will make further recommendations after that study has been done.  I told him he will likely need something done for this given the severity of his pain and his weakness.  I gave him prescriptions for Lidoderm patches and also for Valium. I advised him to speak with his primary physician, Dr. Caryl Never, about his likely sleep apnea.  He will followup with me after his MRI has been done.    [facility]    Danae Orleans. Venetia Maxon, M.D.

## 2012-02-20 ENCOUNTER — Ambulatory Visit (HOSPITAL_COMMUNITY)
Admission: RE | Admit: 2012-02-20 | Discharge: 2012-02-21 | Disposition: A | Discharge status: Home / Self Care | Payer: 59 | Source: Ambulatory Visit | Attending: Neurosurgery | Admitting: Neurosurgery

## 2012-02-20 ENCOUNTER — Encounter (HOSPITAL_COMMUNITY): Payer: Self-pay | Admitting: Anesthesiology

## 2012-02-20 ENCOUNTER — Encounter (HOSPITAL_COMMUNITY)
Admission: RE | Disposition: A | Discharge status: Home / Self Care | Payer: Self-pay | Source: Ambulatory Visit | Attending: Neurosurgery

## 2012-02-20 ENCOUNTER — Ambulatory Visit (HOSPITAL_COMMUNITY): Payer: 59 | Admitting: Anesthesiology

## 2012-02-20 ENCOUNTER — Encounter (HOSPITAL_COMMUNITY): Payer: Self-pay | Admitting: *Deleted

## 2012-02-20 ENCOUNTER — Ambulatory Visit (HOSPITAL_COMMUNITY): Payer: 59

## 2012-02-20 DIAGNOSIS — M47812 Spondylosis without myelopathy or radiculopathy, cervical region: Secondary | ICD-10-CM | POA: Insufficient documentation

## 2012-02-20 DIAGNOSIS — K219 Gastro-esophageal reflux disease without esophagitis: Secondary | ICD-10-CM | POA: Insufficient documentation

## 2012-02-20 DIAGNOSIS — M5020 Other cervical disc displacement, unspecified cervical region: Secondary | ICD-10-CM | POA: Insufficient documentation

## 2012-02-20 HISTORY — PX: ANTERIOR CERVICAL DECOMP/DISCECTOMY FUSION: SHX1161

## 2012-02-20 SURGERY — ANTERIOR CERVICAL DECOMPRESSION/DISCECTOMY FUSION 1 LEVEL
Anesthesia: General | Site: Neck | Wound class: Clean

## 2012-02-20 MED ORDER — CEFAZOLIN SODIUM 1-5 GM-% IV SOLN
1.0000 g | Freq: Three times a day (TID) | INTRAVENOUS | Status: AC
  Administered 2012-02-20 – 2012-02-21 (×2): 1 g via INTRAVENOUS
  Filled 2012-02-20 (×2): qty 50

## 2012-02-20 MED ORDER — VECURONIUM BROMIDE 10 MG IV SOLR
INTRAVENOUS | Status: DC | PRN
  Administered 2012-02-20: 3 mg via INTRAVENOUS
  Administered 2012-02-20: 1 mg via INTRAVENOUS

## 2012-02-20 MED ORDER — ONDANSETRON HCL 4 MG/2ML IJ SOLN
INTRAMUSCULAR | Status: DC | PRN
  Administered 2012-02-20 (×2): 4 mg via INTRAVENOUS

## 2012-02-20 MED ORDER — DROPERIDOL 2.5 MG/ML IJ SOLN
0.6250 mg | INTRAMUSCULAR | Status: DC | PRN
  Administered 2012-02-20: 0.625 mg via INTRAVENOUS

## 2012-02-20 MED ORDER — BUPIVACAINE HCL (PF) 0.5 % IJ SOLN
INTRAMUSCULAR | Status: DC | PRN
  Administered 2012-02-20: 50 mL

## 2012-02-20 MED ORDER — ROCURONIUM BROMIDE 100 MG/10ML IV SOLN
INTRAVENOUS | Status: DC | PRN
  Administered 2012-02-20: 50 mg via INTRAVENOUS

## 2012-02-20 MED ORDER — FLUTICASONE PROPIONATE HFA 44 MCG/ACT IN AERO
1.0000 | INHALATION_SPRAY | Freq: Two times a day (BID) | RESPIRATORY_TRACT | Status: DC
  Filled 2012-02-20: qty 10.6

## 2012-02-20 MED ORDER — SODIUM CHLORIDE 0.9 % IR SOLN
Status: DC | PRN
  Administered 2012-02-20: 16:00:00

## 2012-02-20 MED ORDER — ACETAMINOPHEN 325 MG PO TABS: 650.0000 mg | ORAL_TABLET | ORAL | Status: DC | PRN

## 2012-02-20 MED ORDER — MENTHOL 3 MG MT LOZG
1.0000 | LOZENGE | OROMUCOSAL | Status: DC | PRN
  Administered 2012-02-20: 3 mg via ORAL
  Filled 2012-02-20: qty 9

## 2012-02-20 MED ORDER — DOCUSATE SODIUM 100 MG PO CAPS
100.0000 mg | ORAL_CAPSULE | Freq: Two times a day (BID) | ORAL | Status: DC
  Administered 2012-02-20: 100 mg via ORAL
  Filled 2012-02-20: qty 1

## 2012-02-20 MED ORDER — PHENYLEPHRINE HCL 10 MG/ML IJ SOLN
INTRAMUSCULAR | Status: DC | PRN
  Administered 2012-02-20 (×3): 40 ug via INTRAVENOUS

## 2012-02-20 MED ORDER — SODIUM CHLORIDE 0.9 % IJ SOLN: 3.0000 mL | INTRAMUSCULAR | Status: DC | PRN

## 2012-02-20 MED ORDER — LORATADINE 10 MG PO TABS
10.0000 mg | ORAL_TABLET | Freq: Every day | ORAL | Status: DC
  Filled 2012-02-20: qty 1

## 2012-02-20 MED ORDER — OXYCODONE-ACETAMINOPHEN 5-325 MG PO TABS: 1.0000 | ORAL_TABLET | ORAL | Status: DC | PRN

## 2012-02-20 MED ORDER — OXYCODONE-ACETAMINOPHEN 5-325 MG PO TABS
1.0000 | ORAL_TABLET | ORAL | Status: DC | PRN
  Administered 2012-02-20 – 2012-02-21 (×3): 2 via ORAL
  Filled 2012-02-20 (×3): qty 2

## 2012-02-20 MED ORDER — METHOCARBAMOL 500 MG PO TABS
500.0000 mg | ORAL_TABLET | Freq: Four times a day (QID) | ORAL | Status: DC | PRN
  Administered 2012-02-21: 500 mg via ORAL
  Filled 2012-02-20 (×2): qty 1

## 2012-02-20 MED ORDER — ZOLPIDEM TARTRATE 5 MG PO TABS: 5.0000 mg | ORAL_TABLET | Freq: Every evening | ORAL | Status: DC | PRN

## 2012-02-20 MED ORDER — BACITRACIN 50000 UNITS IM SOLR
INTRAMUSCULAR | Status: AC
  Filled 2012-02-20: qty 1

## 2012-02-20 MED ORDER — HYDROCODONE-ACETAMINOPHEN 5-325 MG PO TABS
1.0000 | ORAL_TABLET | ORAL | Status: DC | PRN
Start: 2012-02-20 — End: 2012-02-21

## 2012-02-20 MED ORDER — KCL IN DEXTROSE-NACL 20-5-0.45 MEQ/L-%-% IV SOLN
INTRAVENOUS | Status: DC
  Filled 2012-02-20 (×2): qty 1000

## 2012-02-20 MED ORDER — DIAZEPAM 5 MG PO TABS
5.0000 mg | ORAL_TABLET | Freq: Every evening | ORAL | Status: DC | PRN
  Administered 2012-02-20: 5 mg via ORAL
  Filled 2012-02-20: qty 1

## 2012-02-20 MED ORDER — AZELASTINE HCL 0.1 % NA SOLN
1.0000 | Freq: Two times a day (BID) | NASAL | Status: DC
  Administered 2012-02-20: 1 via NASAL
  Filled 2012-02-20: qty 30

## 2012-02-20 MED ORDER — GLYCOPYRROLATE 0.2 MG/ML IJ SOLN
INTRAMUSCULAR | Status: DC | PRN
  Administered 2012-02-20: .8 mg via INTRAVENOUS

## 2012-02-20 MED ORDER — SODIUM CHLORIDE 0.9 % IJ SOLN
3.0000 mL | Freq: Two times a day (BID) | INTRAMUSCULAR | Status: DC
  Administered 2012-02-20: 3 mL via INTRAVENOUS

## 2012-02-20 MED ORDER — METHOCARBAMOL 100 MG/ML IJ SOLN
500.0000 mg | Freq: Four times a day (QID) | INTRAVENOUS | Status: DC | PRN
  Administered 2012-02-20: 500 mg via INTRAVENOUS
  Filled 2012-02-20: qty 5

## 2012-02-20 MED ORDER — THROMBIN 5000 UNITS EX SOLR
CUTANEOUS | Status: DC | PRN
  Administered 2012-02-20 (×2): 5000 [IU] via TOPICAL

## 2012-02-20 MED ORDER — ALBUTEROL SULFATE HFA 108 (90 BASE) MCG/ACT IN AERS
INHALATION_SPRAY | RESPIRATORY_TRACT | Status: DC | PRN
  Administered 2012-02-20: 3 via RESPIRATORY_TRACT

## 2012-02-20 MED ORDER — DEXAMETHASONE SODIUM PHOSPHATE 4 MG/ML IJ SOLN
4.0000 mg | Freq: Four times a day (QID) | INTRAMUSCULAR | Status: DC
  Administered 2012-02-20 – 2012-02-21 (×3): 4 mg via INTRAVENOUS
  Filled 2012-02-20 (×7): qty 1

## 2012-02-20 MED ORDER — ACETAMINOPHEN 650 MG RE SUPP: 650.0000 mg | RECTAL | Status: DC | PRN

## 2012-02-20 MED ORDER — FENTANYL CITRATE 0.05 MG/ML IJ SOLN
25.0000 ug | INTRAMUSCULAR | Status: DC | PRN
  Administered 2012-02-20: 25 ug via INTRAVENOUS

## 2012-02-20 MED ORDER — LACTATED RINGERS IV SOLN
INTRAVENOUS | Status: DC | PRN
  Administered 2012-02-20 (×2): via INTRAVENOUS

## 2012-02-20 MED ORDER — PHENOL 1.4 % MT LIQD
1.0000 | OROMUCOSAL | Status: DC | PRN
  Administered 2012-02-20: 1 via OROMUCOSAL
  Filled 2012-02-20: qty 177

## 2012-02-20 MED ORDER — MIDAZOLAM HCL 5 MG/5ML IJ SOLN
INTRAMUSCULAR | Status: DC | PRN
  Administered 2012-02-20: 2 mg via INTRAVENOUS

## 2012-02-20 MED ORDER — LIDOCAINE-EPINEPHRINE 1 %-1:100000 IJ SOLN
INTRAMUSCULAR | Status: DC | PRN
  Administered 2012-02-20: 20 mL

## 2012-02-20 MED ORDER — LIDOCAINE HCL (CARDIAC) 20 MG/ML IV SOLN
INTRAVENOUS | Status: DC | PRN
  Administered 2012-02-20: 100 mg via INTRAVENOUS

## 2012-02-20 MED ORDER — MORPHINE SULFATE 2 MG/ML IJ SOLN
1.0000 mg | INTRAMUSCULAR | Status: DC | PRN
  Administered 2012-02-20: 2 mg via INTRAVENOUS
  Administered 2012-02-20: 4 mg via INTRAVENOUS
  Filled 2012-02-20: qty 1
  Filled 2012-02-20: qty 2

## 2012-02-20 MED ORDER — PROPOFOL 10 MG/ML IV EMUL
INTRAVENOUS | Status: DC | PRN
  Administered 2012-02-20: 200 mg via INTRAVENOUS

## 2012-02-20 MED ORDER — MUPIROCIN 2 % EX OINT
TOPICAL_OINTMENT | Freq: Two times a day (BID) | CUTANEOUS | Status: DC
  Administered 2012-02-20: 22:00:00 via NASAL
  Filled 2012-02-20: qty 22

## 2012-02-20 MED ORDER — PANTOPRAZOLE SODIUM 40 MG PO TBEC: 40.0000 mg | DELAYED_RELEASE_TABLET | Freq: Every day | ORAL | Status: DC

## 2012-02-20 MED ORDER — FENTANYL CITRATE 0.05 MG/ML IJ SOLN
INTRAMUSCULAR | Status: DC | PRN
  Administered 2012-02-20: 50 ug via INTRAVENOUS
  Administered 2012-02-20: 100 ug via INTRAVENOUS
  Administered 2012-02-20: 50 ug via INTRAVENOUS
  Administered 2012-02-20: 150 ug via INTRAVENOUS
  Administered 2012-02-20: 100 ug via INTRAVENOUS

## 2012-02-20 MED ORDER — FENTANYL CITRATE 0.05 MG/ML IJ SOLN
INTRAMUSCULAR | Status: AC
  Filled 2012-02-20: qty 2

## 2012-02-20 MED ORDER — ONDANSETRON HCL 4 MG/2ML IJ SOLN: 4.0000 mg | INTRAMUSCULAR | Status: DC | PRN

## 2012-02-20 MED ORDER — HYDROMORPHONE HCL 2 MG PO TABS
2.0000 mg | ORAL_TABLET | ORAL | Status: DC | PRN
Start: 2012-02-20 — End: 2012-02-21

## 2012-02-20 MED ORDER — SODIUM CHLORIDE 0.9 % IV SOLN
INTRAVENOUS | Status: AC
  Filled 2012-02-20: qty 500

## 2012-02-20 MED ORDER — MONTELUKAST SODIUM 10 MG PO TABS
10.0000 mg | ORAL_TABLET | Freq: Every day | ORAL | Status: DC
  Filled 2012-02-20: qty 1

## 2012-02-20 MED ORDER — NEOSTIGMINE METHYLSULFATE 1 MG/ML IJ SOLN
INTRAMUSCULAR | Status: DC | PRN
  Administered 2012-02-20: 5 mg via INTRAVENOUS

## 2012-02-20 MED ORDER — 0.9 % SODIUM CHLORIDE (POUR BTL) OPTIME
TOPICAL | Status: DC | PRN
  Administered 2012-02-20: 1000 mL

## 2012-02-20 MED ORDER — SODIUM CHLORIDE 0.9 % IV SOLN: 250.0000 mL | INTRAVENOUS | Status: DC

## 2012-02-20 SURGICAL SUPPLY — 65 items
BAG DECANTER FOR FLEXI CONT (MISCELLANEOUS) ×2 IMPLANT
BANDAGE GAUZE ELAST BULKY 4 IN (GAUZE/BANDAGES/DRESSINGS) IMPLANT
BENZOIN TINCTURE PRP APPL 2/3 (GAUZE/BANDAGES/DRESSINGS) IMPLANT
BIT DRILL 14MM (INSTRUMENTS) ×1 IMPLANT
BIT DRILL NEURO 2X3.1 SFT TUCH (MISCELLANEOUS) ×1 IMPLANT
BLADE ULTRA TIP 2M (BLADE) IMPLANT
BONE CERV LORDOTIC 14.5X12X8 (Bone Implant) ×2 IMPLANT
BUR BARREL STRAIGHT FLUTE 4.0 (BURR) ×2 IMPLANT
CANISTER SUCTION 2500CC (MISCELLANEOUS) ×2 IMPLANT
CLOTH BEACON ORANGE TIMEOUT ST (SAFETY) ×2 IMPLANT
CONT SPEC 4OZ CLIKSEAL STRL BL (MISCELLANEOUS) ×2 IMPLANT
COVER MAYO STAND STRL (DRAPES) ×2 IMPLANT
DERMABOND ADVANCED (GAUZE/BANDAGES/DRESSINGS) ×1
DERMABOND ADVANCED .7 DNX12 (GAUZE/BANDAGES/DRESSINGS) ×1 IMPLANT
DRAPE LAPAROTOMY 100X72 PEDS (DRAPES) ×2 IMPLANT
DRAPE MICROSCOPE LEICA (MISCELLANEOUS) ×2 IMPLANT
DRAPE POUCH INSTRU U-SHP 10X18 (DRAPES) ×2 IMPLANT
DRAPE PROXIMA HALF (DRAPES) IMPLANT
DRESSING TELFA 8X3 (GAUZE/BANDAGES/DRESSINGS) IMPLANT
DRILL 14MM (INSTRUMENTS) ×2
DRILL NEURO 2X3.1 SOFT TOUCH (MISCELLANEOUS) ×2
DURAPREP 6ML APPLICATOR 50/CS (WOUND CARE) ×2 IMPLANT
ELECT COATED BLADE 2.86 ST (ELECTRODE) ×2 IMPLANT
ELECT REM PT RETURN 9FT ADLT (ELECTROSURGICAL) ×2
ELECTRODE REM PT RTRN 9FT ADLT (ELECTROSURGICAL) ×1 IMPLANT
GAUZE SPONGE 4X4 16PLY XRAY LF (GAUZE/BANDAGES/DRESSINGS) IMPLANT
GLOVE BIO SURGEON STRL SZ 6.5 (GLOVE) ×2 IMPLANT
GLOVE BIO SURGEON STRL SZ8 (GLOVE) ×2 IMPLANT
GLOVE BIOGEL PI IND STRL 8 (GLOVE) ×1 IMPLANT
GLOVE BIOGEL PI IND STRL 8.5 (GLOVE) ×1 IMPLANT
GLOVE BIOGEL PI INDICATOR 8 (GLOVE) ×1
GLOVE BIOGEL PI INDICATOR 8.5 (GLOVE) ×1
GLOVE ECLIPSE 8.0 STRL XLNG CF (GLOVE) ×2 IMPLANT
GLOVE EXAM NITRILE LRG STRL (GLOVE) IMPLANT
GLOVE EXAM NITRILE MD LF STRL (GLOVE) IMPLANT
GLOVE EXAM NITRILE XL STR (GLOVE) IMPLANT
GLOVE EXAM NITRILE XS STR PU (GLOVE) IMPLANT
GLOVE INDICATOR 6.5 STRL GRN (GLOVE) ×2 IMPLANT
GOWN BRE IMP SLV AUR LG STRL (GOWN DISPOSABLE) IMPLANT
GOWN BRE IMP SLV AUR XL STRL (GOWN DISPOSABLE) IMPLANT
GOWN STRL REIN 2XL LVL4 (GOWN DISPOSABLE) IMPLANT
HEAD HALTER (SOFTGOODS) ×2 IMPLANT
KIT BASIN OR (CUSTOM PROCEDURE TRAY) ×2 IMPLANT
KIT ROOM TURNOVER OR (KITS) ×2 IMPLANT
NEEDLE HYPO 18GX1.5 BLUNT FILL (NEEDLE) IMPLANT
NEEDLE HYPO 25X1 1.5 SAFETY (NEEDLE) ×2 IMPLANT
NS IRRIG 1000ML POUR BTL (IV SOLUTION) ×2 IMPLANT
PACK LAMINECTOMY NEURO (CUSTOM PROCEDURE TRAY) ×2 IMPLANT
PAD ARMBOARD 7.5X6 YLW CONV (MISCELLANEOUS) ×6 IMPLANT
PIN DISTRACTION 14MM (PIN) ×4 IMPLANT
PLATE 14MM (Plate) ×2 IMPLANT
RUBBERBAND STERILE (MISCELLANEOUS) ×4 IMPLANT
SCREW 14MM (Screw) ×8 IMPLANT
SPONGE GAUZE 4X4 12PLY (GAUZE/BANDAGES/DRESSINGS) IMPLANT
SPONGE INTESTINAL PEANUT (DISPOSABLE) ×2 IMPLANT
SPONGE SURGIFOAM ABS GEL SZ50 (HEMOSTASIS) ×2 IMPLANT
STAPLER SKIN PROX WIDE 3.9 (STAPLE) IMPLANT
STRIP CLOSURE SKIN 1/2X4 (GAUZE/BANDAGES/DRESSINGS) IMPLANT
SUT VIC AB 3-0 SH 8-18 (SUTURE) ×4 IMPLANT
SYR 20ML ECCENTRIC (SYRINGE) ×2 IMPLANT
SYR 3ML LL SCALE MARK (SYRINGE) IMPLANT
TOWEL OR 17X24 6PK STRL BLUE (TOWEL DISPOSABLE) ×2 IMPLANT
TOWEL OR 17X26 10 PK STRL BLUE (TOWEL DISPOSABLE) ×2 IMPLANT
TRAP SPECIMEN MUCOUS 40CC (MISCELLANEOUS) ×2 IMPLANT
WATER STERILE IRR 1000ML POUR (IV SOLUTION) ×2 IMPLANT

## 2012-02-20 NOTE — Progress Notes (Signed)
Patient ID: [name] [name], male   DOB: 08/06/1960, 51 y.o.   MRN: [id number] Called to check on cervical wound. trachea in midline, slight hoarseness, no sob. Slight  Swelling in both supraclavicular areas. No weakness. Plan, observation. Ice  To wound prn.see orders

## 2012-02-20 NOTE — Op Note (Signed)
02/20/2012  5:42 PM  PATIENT:  [name] [name]  51 y.o. male  PRE-OPERATIVE DIAGNOSIS:  Cervical hnp without myelopathy, Cervical spondylosis without myelopathy, Cervical degenerative disc disease, Cervical radiculopathy C6/7  POST-OPERATIVE DIAGNOSIS:  Cervical hnp without myelopathy, Cervical spondylosis without myelopathy, Cervical degenerative disc disease, Cervical radiculopathy C6/7  PROCEDURE:  Procedure(s) (LRB): ANTERIOR CERVICAL DECOMPRESSION/DISCECTOMY FUSION 1 LEVEL with allograft bone graft and anterior cervical plate (N/A)  SURGEON:  Surgeon(s) and Role:    * Maeola Harman, MD - Primary  PHYSICIAN ASSISTANT: Botero, MD  ASSISTANTS: none   ANESTHESIA:   general  EBL:  Total I/O In: 1750 [I.V.:1750] Out: 60 [Blood:60]  BLOOD ADMINISTERED:none  DRAINS: none   LOCAL MEDICATIONS USED:  LIDOCAINE   SPECIMEN:  No Specimen  DISPOSITION OF SPECIMEN:  N/A  COUNTS:  YES  TOURNIQUET:  * No tourniquets in log *  DICTATION: Patient was brought to operating room and following the smooth and uncomplicated induction of general endotracheal anesthesia his head was placed on a horseshoe head holder he was placed in 10 pounds of Holter traction and his anterior neck was prepped and draped in usual sterile fashion. An incision was made on the left side of midline after infiltrating the skin and subcutaneous tissues with local lidocaine. The platysmal layer was incised and subplatysmal dissection was performed exposing the anterior border sternocleidomastoid muscle. Using blunt dissection the carotid sheath was kept lateral and trachea and esophagus kept medial exposing the anterior cervical spine. A bent spinal needle was placed it was felt to be the C3/4 and C4/5 levels and this was confirmed on intraoperative x-ray. Longus coli muscles were taken down from the anterior cervical spine using electrocautery and key elevator and self-retaining retractor was placed. The interspace at  C6/7was incised and a thorough discectomy was performed. Distraction pins were placed. Uncinate spurs and central spondylitic ridges were drilled down with a high-speed drill. The spinal cord dura and both C7 nerve roots were widely decompressed. Multiple fragments of herniated disc material were removed from overlying the left C7 nerve root. Hemostasis was assured. After trial sizing an 8 mm bone allograft wedge was selected and tamped into position. Tamped into position and countersunk appropriately. Distraction weight was removed. A 14 mm trestle luxe anterior cervical plate was affixed to the cervical spine with 14 mm variable-angle screws 2 at C6 and 2 at C7. All screws were well-positioned and locking mechanisms were engaged. Soft tissues were inspected and found to be in good repair. The wound was irrigated. The platysma layer was closed with 3-0 Vicryl stitches and the skin was reapproximated with 3-0 Vicryl subcuticular stitches. The wound was dressed with Dermabond. Counts were correct at the end of the case. Patient was extubated and taken to recovery in stable and satisfactory condition.  PLAN OF CARE: Admit for overnight observation  PATIENT DISPOSITION:  PACU - hemodynamically stable.   Delay start of Pharmacological VTE agent (>24hrs) due to surgical blood loss or risk of bleeding: yes

## 2012-02-20 NOTE — Anesthesia Preprocedure Evaluation (Signed)
Anesthesia Evaluation  Patient identified by MRN, date of birth, ID band Patient awake    Reviewed: Allergy & Precautions, H&P , NPO status , Patient's Chart, lab work & pertinent test results  History of Anesthesia Complications (+) PONV  Airway Mallampati: II TM Distance: >3 FB Neck ROM: Full    Dental  (+) Teeth Intact and Dental Advisory Given   Pulmonary asthma ,  breath sounds clear to auscultation        Cardiovascular negative cardio ROS  Rhythm:Regular Rate:Normal     Neuro/Psych  Headaches,    GI/Hepatic Neg liver ROS, GERD-  Medicated and Controlled,  Endo/Other  Morbid obesity  Renal/GU negative Renal ROS     Musculoskeletal   Abdominal   Peds  Hematology   Anesthesia Other Findings   Reproductive/Obstetrics                           Anesthesia Physical Anesthesia Plan  ASA: II  Anesthesia Plan: General   Post-op Pain Management:    Induction: Intravenous  Airway Management Planned: Oral ETT  Additional Equipment:   Intra-op Plan:   Post-operative Plan: Extubation in OR  Informed Consent: I have reviewed the patients History and Physical, chart, labs and discussed the procedure including the risks, benefits and alternatives for the proposed anesthesia with the patient or authorized representative who has indicated his/her understanding and acceptance.   Dental advisory given  Plan Discussed with: Anesthesiologist, CRNA and Surgeon  Anesthesia Plan Comments:         Anesthesia Quick Evaluation

## 2012-02-20 NOTE — Transfer of Care (Signed)
Immediate Anesthesia Transfer of Care Note  Patient: [name] [name]  Procedure(s) Performed: Procedure(s) (LRB): ANTERIOR CERVICAL DECOMPRESSION/DISCECTOMY FUSION 1 LEVEL (N/A)  Patient Location: PACU  Anesthesia Type: General  Level of Consciousness: awake, alert  and oriented  Airway & Oxygen Therapy: Patient Spontanous Breathing and Patient connected to nasal cannula oxygen  Post-op Assessment: Report given to PACU RN and Post -op Vital signs reviewed and stable  Post vital signs: Reviewed and stable  Complications: No apparent anesthesia complications

## 2012-02-20 NOTE — Interval H&P Note (Signed)
History and Physical Interval Note:  02/20/2012 6:58 AM  [name] [name]  has presented today for surgery, with the diagnosis of Cervical hnp without myelopathy, Cervical spondylosis without myelopathy, Cervical degenerative disc disease, Cervical radiculopathy  The various methods of treatment have been discussed with the patient and family. After consideration of risks, benefits and other options for treatment, the patient has consented to  Procedure(s) (LRB): ANTERIOR CERVICAL DECOMPRESSION/DISCECTOMY FUSION 1 LEVEL (N/A) as a surgical intervention .  The patient's history has been reviewed, patient examined, no change in status, stable for surgery.  I have reviewed the patient's chart and labs.  Questions were answered to the patient's satisfaction.     [name]  Date of Initial H&P: 02/19/2012  History reviewed, patient examined, no change in status, stable for surgery.

## 2012-02-20 NOTE — Plan of Care (Signed)
Problem: Consults Goal: Diagnosis - Spinal Surgery Outcome: Completed/Met Date Met:  02/20/12 Cervical Spine Fusion

## 2012-02-20 NOTE — Progress Notes (Signed)
Awake, Alert, conversant.  Full strength bilateral biceps, triceps, HIs.  MAEW.

## 2012-02-20 NOTE — Anesthesia Procedure Notes (Signed)
Procedure Name: Intubation Date/Time: 02/20/2012 3:13 PM Performed by: Margaree Mackintosh Pre-anesthesia Checklist: Patient identified, Timeout performed, Emergency Drugs available, Suction available and Patient being monitored Patient Re-evaluated:Patient Re-evaluated prior to inductionOxygen Delivery Method: Circle system utilized Preoxygenation: Pre-oxygenation with 100% oxygen Intubation Type: IV induction Ventilation: Mask ventilation without difficulty and Oral airway inserted - appropriate to patient size Laryngoscope Size: Mac and 4 Grade View: Grade II Tube type: Oral Tube size: 7.5 mm Number of attempts: 1 Airway Equipment and Method: Stylet Placement Confirmation: ETT inserted through vocal cords under direct vision,  positive ETCO2 and breath sounds checked- equal and bilateral Secured at: 23 cm Tube secured with: Tape Dental Injury: Teeth and Oropharynx as per pre-operative assessment

## 2012-02-20 NOTE — Preoperative (Signed)
Beta Blockers   Reason not to administer Beta Blockers:Not Applicable 

## 2012-02-21 ENCOUNTER — Encounter (HOSPITAL_COMMUNITY): Payer: Self-pay | Admitting: Neurosurgery

## 2012-02-21 MED ORDER — OXYCODONE-ACETAMINOPHEN 5-325 MG PO TABS: 1.0000 | ORAL_TABLET | ORAL | Status: DC | PRN

## 2012-02-21 MED ORDER — DIAZEPAM 5 MG PO TABS: 5.0000 mg | ORAL_TABLET | Freq: Every evening | ORAL | Status: AC | PRN

## 2012-02-21 NOTE — Progress Notes (Signed)
Pt doing very well following surgery. Pt is OOB ambulating with minimal pain. Pt and wife given D/C instructions with Rx's, both verbalized understanding. Pt D/C'd home via wheelchair with wife @ [phone] per MD order. Rema Fendt, RN

## 2012-02-21 NOTE — Progress Notes (Signed)
Subjective: Patient reports doing great  Objective: Vital signs in last 24 hours: Temp:  [97.4 F (36.3 C)-98.6 F (37 C)] 98.4 F (36.9 C) (08/28 0339) Pulse Rate:  [84-117] 107  (08/28 0339) Resp:  [8-20] 18  (08/28 0339) BP: (96-178)/(51-102) 138/87 mmHg (08/28 0339) SpO2:  [81 %-97 %] 96 % (08/28 0339)  Intake/Output from previous day: 08/27 0701 - 08/28 0700 In: 2450 [P.O.:600; I.V.:1850] Out: 60 [Blood:60] Intake/Output this shift:    Physical Exam: Full strength bilateral upper extremities, no numbness.  Dressing CDI  Lab Results: No results found for this basename: WBC:2,HGB:2,HCT:2,PLT:2 in the last 72 hours BMET No results found for this basename: NA:2,K:2,CL:2,CO2:2,GLUCOSE:2,BUN:2,CREATININE:2,CALCIUM:2 in the last 72 hours  Studies/Results: Dg Cervical Spine 1 View  02/20/2012  *RADIOLOGY REPORT*  Clinical Data: C6-7 ACDF.  DG CERVICAL SPINE - 1 VIEW  Comparison: Cervical spine MRI 01/01/2012.  Findings: Single intraoperative view of the cervical spine in the lateral projection is provided.  Two probes are in place.  The more superior localizes the C3-4 level.  The more inferior appears directed at C4-5.  IMPRESSION: Localization as above.   Original Report Authenticated By: Bernadene Bell. Maricela Curet, M.D.     Assessment/Plan: D/C home    LOS: 1 day    Tahni Porchia D, MD 02/21/2012, 7:02 AM

## 2012-02-21 NOTE — Anesthesia Postprocedure Evaluation (Signed)
  Anesthesia Post-op Note  Patient: [name] [name]  Procedure(s) Performed: Procedure(s) (LRB): ANTERIOR CERVICAL DECOMPRESSION/DISCECTOMY FUSION 1 LEVEL (N/A)  Patient Location: PACU  Anesthesia Type: General  Level of Consciousness: awake and alert   Airway and Oxygen Therapy: Patient Spontanous Breathing  Post-op Pain: mild  Post-op Assessment: Post-op Vital signs reviewed, Patient's Cardiovascular Status Stable, Respiratory Function Stable, Patent Airway, No signs of Nausea or vomiting and Pain level controlled  Post-op Vital Signs: stable  Complications: No apparent anesthesia complications

## 2012-02-21 NOTE — Discharge Summary (Signed)
Physician Discharge Summary  Patient ID: [name] [name] MRN: [id number] DOB/AGE: 02-May-1961 50 y.o.  Admit date: 02/20/2012 Discharge date: 02/21/2012  Admission Diagnoses:  Discharge Diagnoses:  Active Problems:  * No active hospital problems. *    Discharged Condition: good  Hospital Course: Uncomplicated ACDF C6/7  Consults: None  Significant Diagnostic Studies: None  Treatments: surgery:ACDF C6/7   Discharge Exam: Blood pressure 138/87, pulse 107, temperature 98.4 F (36.9 C), temperature source Oral, resp. rate 18, SpO2 96.00%. Neurologic: Alert and oriented X 3, normal strength and tone. Normal symmetric reflexes. Normal coordination and gait Wound:CDI  Disposition: Home   Medication List  As of 02/21/2012  8:14 AM   STOP taking these medications         mupirocin ointment 2 %         TAKE these medications         azelastine 137 MCG/SPRAY nasal spray   Commonly known as: ASTELIN   Place 1 spray into the nose 2 (two) times daily. Use in each nostril as directed      beclomethasone 80 MCG/ACT inhaler   Commonly known as: QVAR   Inhale 2 puffs into the lungs 2 (two) times daily.      diazepam 5 MG tablet   Commonly known as: VALIUM   Take 1 tablet (5 mg total) by mouth at bedtime as needed.      fexofenadine 180 MG tablet   Commonly known as: ALLEGRA   Take 180 mg by mouth daily.      HYDROmorphone 2 MG tablet   Commonly known as: DILAUDID   Take 2 mg by mouth every 4 (four) hours as needed. Q4-6 PRN      lidocaine 5 %   Commonly known as: LIDODERM   Place 1 patch onto the skin daily. Remove & Discard patch within 12 hours or as directed by MD      montelukast 10 MG tablet   Commonly known as: SINGULAIR   Take 1 tablet (10 mg total) by mouth at bedtime.      omeprazole 40 MG capsule   Commonly known as: PRILOSEC   Take 40 mg by mouth daily as needed. For acid reflux      oxyCODONE-acetaminophen 5-325 MG per tablet   Commonly known as:  PERCOCET/ROXICET   Take 1 tablet by mouth every 4 (four) hours as needed.             Signed: Dorian Heckle, MD 02/21/2012, 8:14 AM

## 2012-06-10 ENCOUNTER — Other Ambulatory Visit: Payer: Self-pay | Admitting: Family Medicine

## 2012-09-19 ENCOUNTER — Other Ambulatory Visit: Payer: Self-pay | Admitting: *Deleted

## 2012-09-19 MED ORDER — OMEPRAZOLE 40 MG PO CPDR: 40.0000 mg | DELAYED_RELEASE_CAPSULE | Freq: Every day | ORAL | Status: DC | PRN

## 2013-03-11 ENCOUNTER — Other Ambulatory Visit (INDEPENDENT_AMBULATORY_CARE_PROVIDER_SITE_OTHER): Payer: 59

## 2013-03-11 DIAGNOSIS — Z0000 Encounter for general adult medical examination without abnormal findings: Secondary | ICD-10-CM

## 2013-03-11 LAB — LIPID PANEL
Cholesterol: 216 mg/dL — ABNORMAL HIGH (ref 0–200)
HDL: 34.9 mg/dL — ABNORMAL LOW (ref >39.00)
VLDL: 69.4 mg/dL — ABNORMAL HIGH (ref 0.0–40.0)

## 2013-03-11 LAB — HEPATIC FUNCTION PANEL
AST: 25 U/L (ref 0–37)
Albumin: 4.5 g/dL (ref 3.5–5.2)
Alkaline Phosphatase: 69 U/L (ref 39–117)
Bilirubin, Direct: 0.1 mg/dL (ref 0.0–0.3)

## 2013-03-11 LAB — POCT URINALYSIS DIPSTICK
Blood, UA: NEGATIVE
Glucose, UA: NEGATIVE
Ketones, UA: NEGATIVE
Protein, UA: NEGATIVE
Spec Grav, UA: 1.02
Urobilinogen, UA: 0.2

## 2013-03-11 LAB — BASIC METABOLIC PANEL
BUN: 14 mg/dL (ref 6–23)
Calcium: 9.5 mg/dL (ref 8.4–10.5)
Creatinine, Ser: 1.1 mg/dL (ref 0.4–1.5)
GFR: 72.77 mL/min (ref >60.00)
Glucose, Bld: 135 mg/dL — ABNORMAL HIGH (ref 70–99)
Sodium: 137 mEq/L (ref 135–145)

## 2013-03-11 LAB — CBC WITH DIFFERENTIAL/PLATELET
Basophils Absolute: 0.1 10*3/uL (ref 0.0–0.1)
Lymphocytes Relative: 27.1 % (ref 12.0–46.0)
Monocytes Relative: 7.5 % (ref 3.0–12.0)
Platelets: 198 10*3/uL (ref 150.0–400.0)
RDW: 13.7 % (ref 11.5–14.6)

## 2013-03-11 LAB — TSH: TSH: 0.49 u[IU]/mL (ref 0.35–5.50)

## 2013-03-18 ENCOUNTER — Ambulatory Visit (INDEPENDENT_AMBULATORY_CARE_PROVIDER_SITE_OTHER): Payer: 59 | Admitting: Family Medicine

## 2013-03-18 ENCOUNTER — Encounter: Payer: Self-pay | Admitting: Family Medicine

## 2013-03-18 VITALS — BP 140/70 | HR 97 | Temp 98.4°F | Ht 73.0 in | Wt 312.0 lb

## 2013-03-18 DIAGNOSIS — E8881 Metabolic syndrome: Secondary | ICD-10-CM

## 2013-03-18 DIAGNOSIS — E669 Obesity, unspecified: Secondary | ICD-10-CM | POA: Insufficient documentation

## 2013-03-18 DIAGNOSIS — R739 Hyperglycemia, unspecified: Secondary | ICD-10-CM

## 2013-03-18 DIAGNOSIS — Z23 Encounter for immunization: Secondary | ICD-10-CM

## 2013-03-18 DIAGNOSIS — E785 Hyperlipidemia, unspecified: Secondary | ICD-10-CM

## 2013-03-18 DIAGNOSIS — Z0000 Encounter for general adult medical examination without abnormal findings: Secondary | ICD-10-CM

## 2013-03-18 MED ORDER — BECLOMETHASONE DIPROPIONATE 80 MCG/ACT IN AERS: 2.0000 | INHALATION_SPRAY | Freq: Two times a day (BID) | RESPIRATORY_TRACT | Status: DC

## 2013-03-18 MED ORDER — DESOXIMETASONE 0.25 % EX CREA: TOPICAL_CREAM | Freq: Two times a day (BID) | CUTANEOUS | Status: DC

## 2013-03-18 MED ORDER — AZELASTINE HCL 0.1 % NA SOLN: 1.0000 | Freq: Two times a day (BID) | NASAL | Status: DC

## 2013-03-18 MED ORDER — MONTELUKAST SODIUM 10 MG PO TABS: 10.0000 mg | ORAL_TABLET | Freq: Every day | ORAL | Status: DC

## 2013-03-18 MED ORDER — OMEPRAZOLE 40 MG PO CPDR: 40.0000 mg | DELAYED_RELEASE_CAPSULE | Freq: Every day | ORAL | Status: DC | PRN

## 2013-03-18 MED ORDER — LEVOCETIRIZINE DIHYDROCHLORIDE 5 MG PO TABS: 5.0000 mg | ORAL_TABLET | Freq: Every evening | ORAL | Status: DC

## 2013-03-18 NOTE — Progress Notes (Signed)
Subjective:    Patient ID: [name] [name], male    DOB: June 13, 1961, 52 y.o.   MRN: [id number]  HPI Patient seen for complete physical He has recent upper respiratory infections had dry cough for 3 weeks. He feels is wheezing some off and on. He does have history of asthma and is currently out of some of his regular medications. He has perennial allergies and history of GERD. He's had previous colon polyps. He had colonoscopy 2011.  He takes several medications which are reviewed. He's requesting prescription for levocetirizine which his son had used with good success.  Excessive weight gain over recent years. No consistent exercise. Exercise limited by chronic back and arthritic difficulties.  He has eczema involving left hand and first through third digits. Tried over-the-counter creams without much improvement. Rash starts as blisters that become unroofed and then scaly  He complains of some nocturia. He does drink caffeine at night. No obstructive symptoms. No alcohol use. Recent PSA normal    Past Medical History  Diagnosis Date  . Asthma   . Hx of colonic polyps   . GERD (gastroesophageal reflux disease)   . Blood in stool   . Hay fever   . Migraines   . COPD (chronic obstructive pulmonary disease)   . Arthritis   . PONV (postoperative nausea and vomiting)     also history of motion sickness   Past Surgical History  Procedure Laterality Date  . Appendectomy  1984  . Nasal septum surgery  2008  . Knee arthroscopy      right  . Anterior cervical decomp/discectomy fusion  02/20/2012    Procedure: ANTERIOR CERVICAL DECOMPRESSION/DISCECTOMY FUSION 1 LEVEL;  Surgeon: Maeola Harman, MD;  Location: [facility];  Service: Neurosurgery;  Laterality: N/A;  Cervical six - seven  Anterior cervical decompression/diskectomy/fusion    reports that he has quit smoking. He does not have any smokeless tobacco history on file. He reports that  drinks alcohol. He reports that he does not  use illicit drugs. family history includes Cancer in his maternal grandmother, maternal uncle, maternal uncle, maternal uncle, maternal uncle, maternal uncle, maternal uncle, and mother; Diabetes in his brother, father, and paternal grandfather. No Known Allergies    Review of Systems  Constitutional: Negative for fever, activity change, appetite change and fatigue.  HENT: Positive for congestion. Negative for ear pain and trouble swallowing.   Eyes: Negative for pain and visual disturbance.  Respiratory: Positive for cough and wheezing. Negative for shortness of breath.   Cardiovascular: Negative for chest pain and palpitations.  Gastrointestinal: Negative for nausea, vomiting, abdominal pain, diarrhea, constipation, blood in stool, abdominal distention and rectal pain.  Genitourinary: Positive for frequency. Negative for dysuria, hematuria and testicular pain.  Musculoskeletal: Negative for joint swelling and arthralgias.  Skin: Positive for rash.  Neurological: Negative for dizziness, syncope and headaches.  Hematological: Negative for adenopathy.  Psychiatric/Behavioral: Negative for confusion and dysphoric mood.       Objective:   Physical Exam  Constitutional: He is oriented to person, place, and time. He appears well-developed and well-nourished. No distress.  HENT:  Head: Normocephalic and atraumatic.  Right Ear: External ear normal.  Left Ear: External ear normal.  Mouth/Throat: Oropharynx is clear and moist.  Eyes: Conjunctivae and EOM are normal. Pupils are equal, round, and reactive to light.  Neck: Normal range of motion. Neck supple. No thyromegaly present.  Cardiovascular: Normal rate, regular rhythm and normal heart sounds.   No murmur heard. Pulmonary/Chest:  No respiratory distress. He has no wheezes. He has no rales.  Abdominal: Soft. Bowel sounds are normal. He exhibits no distension and no mass. There is no tenderness. There is no rebound and no guarding.   Musculoskeletal: He exhibits no edema.  Lymphadenopathy:    He has no cervical adenopathy.  Neurological: He is alert and oriented to person, place, and time. He displays normal reflexes. No cranial nerve deficit.  Skin: Rash noted.  Patient has dry scaly rash left hand first second and third digits. No pustules.  Psychiatric: He has a normal mood and affect.          Assessment & Plan:  #1 health maintenance. Labs reviewed. He has significant abnormalities including dyslipidemia along with elevated blood sugar 135. He has evidence for metabolic syndrome with basically all 5 components. We discussed the significance of this with patient and wife. We've recommended weight loss and start regular exercise and reassess in 2 months. Obtain repeat labs then with A1c and fasting glucose along with repeat lipids. #2 dermatitis involving the hand- question dyshidrotic eczema. Topicort 0.25% cream twice a day no longer than 2 weeks continuously #3 perennial allergies. Refill medications. Add levocetirizine 5 mg daily #4 persistent asthma. Refill Qvar and Singulair. Flu vaccine given

## 2013-03-18 NOTE — Patient Instructions (Signed)
Hand Dermatitis Hand dermatitis (dyshidrotic eczema) is a skin condition in which small, itchy, raised dots or fluid-filled blisters form over the palms of the hands. Outbreaks of hand dermatitis can last 3 to 4 weeks. CAUSES  The cause of hand dermatitis is unknown. However, it occurs most often in patients with a history of allergies such as:  Hay fever.  Allergic asthma.  Allergies to latex. Chemical exposure, injuries, and environmental irritants can make hand dermatitis worse. Washing your hands too frequently can remove natural oils, which can dry out the skin and contribute to outbreaks of hand dermatitis. SYMPTOMS  The most common symptom of hand dermatitis is intense itching. Cracks or grooves (fissures) on the fingers can also develop. Affected areas can be painful, especially areas where large blisters have formed. DIAGNOSIS Your caregiver can usually tell what the problem is by doing a physical exam. PREVENTION  Avoid excessive hand washing.  Avoid the use of harsh chemicals.  Wear protective gloves when handling products that can irritate your skin. TREATMENT  Steroid creams and ointments, such as over-the-counter 1% hydrocortisone cream, can reduce inflammation and improve moisture retention. These should be applied at least 2 to 4 times per day. Your caregiver may ask you to use a stronger prescription steroid cream to help speed the healing of blistered and cracked skin. In severe cases, oral steroid medicine may be needed. If you have an infection, antibiotics may be needed. Your caregiver may also prescribe antihistamines. These medicines help reduce itching. HOME CARE INSTRUCTIONS  Only take over-the-counter or prescription medicines as directed by your caregiver.  You may use wet or cold compresses. This can help:  Alleviate itching.  Increase the effectiveness of topical creams.  Minimize blisters. SEEK MEDICAL CARE IF:  The rash is not better after 1 week of  treatment.  Signs of infection develop, such as redness, tenderness, or yellowish-white fluid (pus).  The rash is spreading. Document Released: 06/12/2005 Document Revised: 09/04/2011 Document Reviewed: 11/09/2010 [facility] Patient Information 2014 [place], [place]. Metabolic Syndrome, Adult Metabolic syndrome descibes a group of risk factors for heart disease and diabetes. This syndrome has other names including Insulin Resistance Syndrome. The more risk factors you have, the higher your risk of having a heart attack, stroke, or developing diabetes. These risk factors include:  High blood sugar.  High blood triglyceride (a fat found in the blood) level.  High blood pressure.  Abdominal obesity (your extra weight is around your waist instead of your hips).  Low levels of high-density lipoprotein, HDL (good blood cholesterol). If you have any three of these risk factors, you have metabolic syndrome. If you have even one of these factors, you should make lifestyle changes to improve your health in order to prevent serious health diseases.  In people with metabolic syndrome, the cells do not respond properly to insulin. This can lead to high levels of glucose in the blood, which can interfere with normal body processes. Eventually, this can cause high blood pressure and higher fat levels in the blood, and inflammation of your blood vessels. The result can be heart disease and stroke.  CAUSES   Eating a diet rich in calories and saturated fat.  Too little physical activity.  Being overweight. Other underlying causes are:  Family history (genetics).  Ethnicity (South Asians are at a higher risk).  Older age (your chances of developing metabolic syndrome are higher as you grow older).  Insulin resistance. SYMPTOMS  By itself, metabolic syndrome has no symptoms. However,  you might have symptoms of diabetes (high blood sugar) or high blood pressure, such as:  Increased thirst,  urination, and tiredness.  Dizzy spells.  Dull headaches that are unusual for you.  Blurred vision.  Nosebleeds. DIAGNOSIS  Your caregiver may make a diagnosis of metabolic syndrome if you have at least three of these factors:  If you are overweight mostly around the waist. This means a waistline greater than 40" in men and more than 35" in women. The waistline limits are 31 to 35 inches for women and 37 to 39 inches for men. In those who have certain genetic risk factors, such as having a family history of diabetes or being of Asian descent.  If you have a blood pressure of 130/85 mm Hg or more, or if you are being treated for high blood pressure.  If your blood triglyceride level is 150 mg/dL or more, or you are being treated for high levels of triglyceride.  If the level of HDL in your blood is below 40 mg/dL in men, less than 50 mg/dL in women, or you are receiving treatment for low levels of HDL.  If the level of sugar in your blood is high with fasting blood sugar level of 110 mg/dL or more, or you are under treatment for diabetes. TREATMENT  Your caregiver may have you make lifestyle changes, which may include:  Exercise.  Losing weight.  Maintaining a healthy diet.  Quitting smoking. The lifestyle changes listed above are key in reducing your risk for heart disease and stroke. Medicines may also be prescribed to help your body respond to insulin better and to reduce your blood pressure and blood fat levels. Aspirin may be recommended to reduce risks of heart disease or stroke.  HOME CARE INSTRUCTIONS   Exercise.  Measure your waist at regular intervals just above the hipbones after you have breathed out.  Maintain a healthy diet.  Eat fruits, such as apples, oranges, and pears.  Eat vegetables.  Eat legumes, such as kidney beans, peas, and lentils.  Eat food rich in soluble fiber, such as whole grain cereal, oatmeal, and oat bran.  Use olive or safflower oils  and avoid saturated fats.  Eat nuts.  Limit the amount of salt you eat or add to food.  Limit the amount of alcohol you drink.  Include fish in your diet, if possible.  Stop smoking if you are a smoker.  Maintain regular follow-up appointments.  Follow your caregiver's advice. SEEK MEDICAL CARE IF:   You feel very tired or fatigued.  You develop excessive thirst.  You pass large quantities of urine.  You are putting on weight around your waist rather than losing weight.  You develop headaches over and over again.  You have off-and-on dizzy spells. SEEK IMMEDIATE MEDICAL CARE IF:   You develop nosebleeds.  You develop sudden blurred vision.  You develop sudden dizzy spells.  You develop chest pains, trouble breathing, or feel an abnormal or irregular heart beat.  You have a fainting episode.  You develop any sudden trouble speaking and/or swallowing.  You develop sudden weakness in one arm and/or one leg. MAKE SURE YOU:   Understand these instructions.  Will watch your condition.  Will get help right away if you are not doing well or get worse. Document Released: 09/19/2007 Document Revised: 09/04/2011 Document Reviewed: 09/19/2007 [facility] Patient Information 2014 [place], [place].

## 2013-05-16 ENCOUNTER — Other Ambulatory Visit: Payer: 59

## 2013-07-14 ENCOUNTER — Telehealth: Payer: Self-pay | Admitting: Family Medicine

## 2013-07-14 ENCOUNTER — Ambulatory Visit (INDEPENDENT_AMBULATORY_CARE_PROVIDER_SITE_OTHER): Payer: 59 | Admitting: Family Medicine

## 2013-07-14 ENCOUNTER — Other Ambulatory Visit: Payer: Self-pay

## 2013-07-14 ENCOUNTER — Encounter: Payer: Self-pay | Admitting: Family Medicine

## 2013-07-14 VITALS — BP 130/80 | HR 102 | Temp 98.4°F | Wt 311.9 lb

## 2013-07-14 DIAGNOSIS — B349 Viral infection, unspecified: Secondary | ICD-10-CM

## 2013-07-14 DIAGNOSIS — B9789 Other viral agents as the cause of diseases classified elsewhere: Secondary | ICD-10-CM

## 2013-07-14 MED ORDER — HYDROCODONE-HOMATROPINE 5-1.5 MG/5ML PO SYRP: 5.0000 mL | ORAL_SOLUTION | Freq: Four times a day (QID) | ORAL | Status: AC | PRN

## 2013-07-14 MED ORDER — AZITHROMYCIN 250 MG PO TABS: ORAL_TABLET | ORAL | Status: AC

## 2013-07-14 MED ORDER — AZITHROMYCIN 250 MG PO TABS: ORAL_TABLET | ORAL | Status: DC

## 2013-07-14 NOTE — Telephone Encounter (Signed)
Patient Information:  Caller Name: [name]  Phone: [phone]  Patient: [name] [name]  Gender: Male  DOB: 1960/09/07  Age: 53 Years  PCP: Carolann Littler [facility])  Office Follow Up:  Does the office need to follow up with this patient?: No  Instructions For The Office: N/A  RN Note:  Reports intermittent wheezing.  Has no rescue inhaler. Uses Qvar 2 X/week with improvement to wheezing.  Symptoms  Reason For Call & Symptoms: Severe sore throat when coughs;  Has with chest pain only when coughs.  Reviewed Health History In EMR: Yes  Reviewed Medications In EMR: Yes  Reviewed Allergies In EMR: Yes  Reviewed Surgeries / Procedures: Yes  Date of Onset of Symptoms: 07/13/2013  Treatments Tried: chloreseptic, QVar 2X/week.  Treatments Tried Worked: No  Guideline(s) Used:  Cough  Disposition Per Guideline:   Go to Office Now  Reason For Disposition Reached:   Wheezing is present  Advice Given:  Reassurance  Coughing is the way that our lungs remove irritants and mucus. It helps protect our lungs from getting pneumonia.  Cough Medicines:  OTC Cough Syrups: The most common cough suppressant in OTC cough medications is dextromethorphan. Often the letters "DM" appear in the name.  Home Remedy - Honey: This old home remedy has been shown to help decrease coughing at night. The adult dosage is 2 teaspoons (10 ml) at bedtime. Honey should not be given to infants under one year of age.  Coughing Spasms:  Drink warm fluids. Inhale warm mist (Reason: both relax the airway and loosen up the phlegm).  Suck on cough drops or hard candy to coat the irritated throat.  Prevent Dehydration:  Drink adequate liquids.  This will help soothe an irritated or dry throat and loosen up the phlegm.  Expected Course:   The expected course depends on what is causing the cough.  Viral bronchitis (chest cold) causes a cough that lasts 1 to 3 weeks. Sometimes you may cough up lots of  phlegm (sputum, mucus). The mucus can normally be white, gray, yellow, or green.  Call Back If:  Difficulty breathing  Cough lasts more than 3 weeks  You become worse.  Patient Will Follow Care Advice:  YES  Appointment Scheduled:  07/14/2013 14:45:00 Appointment Scheduled Provider:  Carolann Littler [facility])

## 2013-07-14 NOTE — Patient Instructions (Signed)
Acute Bronchitis Bronchitis is inflammation of the airways that extend from the windpipe into the lungs (bronchi). The inflammation often causes mucus to develop. This leads to a cough, which is the most common symptom of bronchitis.  In acute bronchitis, the condition usually develops suddenly and goes away over time, usually in a couple weeks. Smoking, allergies, and asthma can make bronchitis worse. Repeated episodes of bronchitis may cause further lung problems.  CAUSES Acute bronchitis is most often caused by the same virus that causes a cold. The virus can spread from person to person (contagious).  SIGNS AND SYMPTOMS   Cough.   Fever.   Coughing up mucus.   Body aches.   Chest congestion.   Chills.   Shortness of breath.   Sore throat.  DIAGNOSIS  Acute bronchitis is usually diagnosed through a physical exam. Tests, such as chest X-rays, are sometimes done to rule out other conditions.  TREATMENT  Acute bronchitis usually goes away in a couple weeks. Often times, no medical treatment is necessary. Medicines are sometimes given for relief of fever or cough. Antibiotics are usually not needed but may be prescribed in certain situations. In some cases, an inhaler may be recommended to help reduce shortness of breath and control the cough. A cool mist vaporizer may also be used to help thin bronchial secretions and make it easier to clear the chest.  HOME CARE INSTRUCTIONS  Get plenty of rest.   Drink enough fluids to keep your urine clear or pale yellow (unless you have a medical condition that requires fluid restriction). Increasing fluids may help thin your secretions and will prevent dehydration.   Only take over-the-counter or prescription medicines as directed by your health care provider.   Avoid smoking and secondhand smoke. Exposure to cigarette smoke or irritating chemicals will make bronchitis worse. If you are a smoker, consider using nicotine gum or skin  patches to help control withdrawal symptoms. Quitting smoking will help your lungs heal faster.   Reduce the chances of another bout of acute bronchitis by washing your hands frequently, avoiding people with cold symptoms, and trying not to touch your hands to your mouth, nose, or eyes.   Follow up with your health care provider as directed.  SEEK MEDICAL CARE IF: Your symptoms do not improve after 1 week of treatment.  SEEK IMMEDIATE MEDICAL CARE IF:  You develop an increased fever or chills.   You have chest pain.   You have severe shortness of breath.  You have bloody sputum.   You develop dehydration.  You develop fainting.  You develop repeated vomiting.  You develop a severe headache. MAKE SURE YOU:   Understand these instructions.  Will watch your condition.  Will get help right away if you are not doing well or get worse. Document Released: 07/20/2004 Document Revised: 02/12/2013 Document Reviewed: 12/03/2012 [employer] Patient Information 2014 [employer].  

## 2013-07-14 NOTE — Telephone Encounter (Signed)
FYI! Pt coming to see BB.

## 2013-07-14 NOTE — Progress Notes (Signed)
Pre visit review using our clinic review tool, if applicable. No additional management support is needed unless otherwise documented below in the visit note. 

## 2013-07-14 NOTE — Telephone Encounter (Signed)
Noted  

## 2013-07-14 NOTE — Progress Notes (Signed)
   Subjective:    Patient ID: [name] [name], male    DOB: July 05, 1960, 53 y.o.   MRN: [id number]  HPI Acute illness. Onset yesterday of cough and body aches. Minimal nasal congestion. Has been chilled but no confirmed fevers. Denies any dyspnea. He has history of asthma which is been controlled with steroid inhaler. Has not had any wheezing or shortness of breath. No nausea or vomiting. Cough is been severe at night. Not relieved with over-the-counter medications.  Past Medical History  Diagnosis Date  . Asthma   . Hx of colonic polyps   . GERD (gastroesophageal reflux disease)   . Blood in stool   . Hay fever   . Migraines   . COPD (chronic obstructive pulmonary disease)   . Arthritis   . PONV (postoperative nausea and vomiting)     also history of motion sickness   Past Surgical History  Procedure Laterality Date  . Appendectomy  1984  . Nasal septum surgery  2008  . Knee arthroscopy      right  . Anterior cervical decomp/discectomy fusion  02/20/2012    Procedure: ANTERIOR CERVICAL DECOMPRESSION/DISCECTOMY FUSION 1 LEVEL;  Surgeon: Erline Levine, MD;  Location: [place] [facility];  Service: Neurosurgery;  Laterality: N/A;  Cervical six - seven  Anterior cervical decompression/diskectomy/fusion    reports that he has quit smoking. He does not have any smokeless tobacco history on file. He reports that he drinks alcohol. He reports that he does not use illicit drugs. family history includes Cancer in his maternal grandmother, maternal uncle, maternal uncle, maternal uncle, maternal uncle, maternal uncle, maternal uncle, and mother; Diabetes in his brother, father, and paternal grandfather. No Known Allergies    Review of Systems  Constitutional: Positive for chills and fatigue. Negative for fever.  HENT: Positive for congestion.   Respiratory: Positive for cough. Negative for shortness of breath and wheezing.        Objective:   Physical Exam  Constitutional: He appears  well-developed and well-nourished.  HENT:  Right Ear: External ear normal.  Left Ear: External ear normal.  Mouth/Throat: Oropharynx is clear and moist.  Neck: Neck supple.  Cardiovascular: Normal rate and regular rhythm.   Pulmonary/Chest: Effort normal and breath sounds normal. No respiratory distress. He has no wheezes. He has no rales.  Lymphadenopathy:    He has no cervical adenopathy.          Assessment & Plan:  Acute viral syndrome. Hycodan cough syrup for nighttime use as needed. Have not recommended antibiotics this point but if he develops any fever or worsening respiratory symptoms consider Zithromax.

## 2014-08-25 DIAGNOSIS — E119 Type 2 diabetes mellitus without complications: Secondary | ICD-10-CM

## 2014-08-25 HISTORY — DX: Type 2 diabetes mellitus without complications: E11.9

## 2014-09-03 ENCOUNTER — Other Ambulatory Visit: Payer: Self-pay | Admitting: Family Medicine

## 2014-09-03 ENCOUNTER — Telehealth: Payer: Self-pay | Admitting: Family Medicine

## 2014-09-03 DIAGNOSIS — Z0000 Encounter for general adult medical examination without abnormal findings: Secondary | ICD-10-CM

## 2014-09-03 NOTE — Telephone Encounter (Signed)
Lab is ordered.  

## 2014-09-03 NOTE — Telephone Encounter (Signed)
Yes.  Would think he should be getting that anyway as he is > 50.

## 2014-09-03 NOTE — Telephone Encounter (Signed)
Pt would like to add psa labs to his cpx on Monday. Is that ok?

## 2014-09-07 ENCOUNTER — Other Ambulatory Visit (INDEPENDENT_AMBULATORY_CARE_PROVIDER_SITE_OTHER): Payer: 59

## 2014-09-07 DIAGNOSIS — Z0000 Encounter for general adult medical examination without abnormal findings: Secondary | ICD-10-CM

## 2014-09-07 DIAGNOSIS — R7989 Other specified abnormal findings of blood chemistry: Secondary | ICD-10-CM

## 2014-09-07 LAB — BASIC METABOLIC PANEL
BUN: 13 mg/dL (ref 6–23)
CALCIUM: 10.2 mg/dL (ref 8.4–10.5)
CHLORIDE: 100 meq/L (ref 96–112)
CO2: 28 meq/L (ref 19–32)
Creatinine, Ser: 1.08 mg/dL (ref 0.40–1.50)
GFR: 75.46 mL/min (ref >60.00)
GLUCOSE: 336 mg/dL — AB (ref 70–99)
Potassium: 5.2 mEq/L — ABNORMAL HIGH (ref 3.5–5.1)
SODIUM: 135 meq/L (ref 135–145)

## 2014-09-07 LAB — CBC WITH DIFFERENTIAL/PLATELET
Basophils Absolute: 0.1 10*3/uL (ref 0.0–0.1)
Basophils Relative: 1.7 % (ref 0.0–3.0)
EOS PCT: 3.3 % (ref 0.0–5.0)
Eosinophils Absolute: 0.2 10*3/uL (ref 0.0–0.7)
HEMATOCRIT: 49.4 % (ref 39.0–52.0)
HEMOGLOBIN: 17.1 g/dL — AB (ref 13.0–17.0)
Lymphocytes Relative: 28.7 % (ref 12.0–46.0)
Lymphs Abs: 1.6 10*3/uL (ref 0.7–4.0)
MCHC: 34.6 g/dL (ref 30.0–36.0)
MCV: 87.5 fl (ref 78.0–100.0)
MONOS PCT: 7.2 % (ref 3.0–12.0)
Monocytes Absolute: 0.4 10*3/uL (ref 0.1–1.0)
NEUTROS PCT: 59.1 % (ref 43.0–77.0)
Neutro Abs: 3.4 10*3/uL (ref 1.4–7.7)
PLATELETS: 192 10*3/uL (ref 150.0–400.0)
RBC: 5.65 Mil/uL (ref 4.22–5.81)
RDW: 13.4 % (ref 11.5–15.5)
WBC: 5.7 10*3/uL (ref 4.0–10.5)

## 2014-09-07 LAB — LIPID PANEL
CHOL/HDL RATIO: 5
Cholesterol: 194 mg/dL (ref 0–200)
HDL: 39.3 mg/dL (ref >39.00)
NONHDL: 154.7
Triglycerides: 288 mg/dL — ABNORMAL HIGH (ref 0.0–149.0)
VLDL: 57.6 mg/dL — AB (ref 0.0–40.0)

## 2014-09-07 LAB — TSH: TSH: 0.58 u[IU]/mL (ref 0.35–4.50)

## 2014-09-07 LAB — PSA: PSA: 0.99 ng/mL (ref 0.10–4.00)

## 2014-09-07 LAB — HEPATIC FUNCTION PANEL
ALK PHOS: 95 U/L (ref 39–117)
ALT: 42 U/L (ref 0–53)
AST: 19 U/L (ref 0–37)
Albumin: 4.7 g/dL (ref 3.5–5.2)
BILIRUBIN DIRECT: 0.1 mg/dL (ref 0.0–0.3)
Total Bilirubin: 0.6 mg/dL (ref 0.2–1.2)
Total Protein: 7.3 g/dL (ref 6.0–8.3)

## 2014-09-07 LAB — LDL CHOLESTEROL, DIRECT: LDL DIRECT: 116 mg/dL

## 2014-09-10 ENCOUNTER — Other Ambulatory Visit: Payer: Self-pay | Admitting: Family Medicine

## 2014-09-10 ENCOUNTER — Encounter: Payer: Self-pay | Admitting: Family Medicine

## 2014-09-10 ENCOUNTER — Ambulatory Visit (INDEPENDENT_AMBULATORY_CARE_PROVIDER_SITE_OTHER): Payer: 59 | Admitting: Family Medicine

## 2014-09-10 ENCOUNTER — Other Ambulatory Visit (INDEPENDENT_AMBULATORY_CARE_PROVIDER_SITE_OTHER): Payer: 59

## 2014-09-10 VITALS — BP 126/80 | HR 78 | Temp 97.7°F | Ht 73.0 in | Wt 278.0 lb

## 2014-09-10 DIAGNOSIS — Z0000 Encounter for general adult medical examination without abnormal findings: Secondary | ICD-10-CM

## 2014-09-10 DIAGNOSIS — R739 Hyperglycemia, unspecified: Secondary | ICD-10-CM

## 2014-09-10 DIAGNOSIS — E1165 Type 2 diabetes mellitus with hyperglycemia: Secondary | ICD-10-CM

## 2014-09-10 DIAGNOSIS — N183 Chronic kidney disease, stage 3 unspecified: Secondary | ICD-10-CM | POA: Insufficient documentation

## 2014-09-10 DIAGNOSIS — E785 Hyperlipidemia, unspecified: Secondary | ICD-10-CM

## 2014-09-10 DIAGNOSIS — E119 Type 2 diabetes mellitus without complications: Secondary | ICD-10-CM | POA: Insufficient documentation

## 2014-09-10 DIAGNOSIS — M546 Pain in thoracic spine: Secondary | ICD-10-CM

## 2014-09-10 DIAGNOSIS — IMO0002 Reserved for concepts with insufficient information to code with codable children: Secondary | ICD-10-CM

## 2014-09-10 LAB — GLUCOSE, POCT (MANUAL RESULT ENTRY): POC Glucose: 251 mg/dl — AB (ref 70–99)

## 2014-09-10 LAB — HEMOGLOBIN A1C: Hgb A1c MFr Bld: 11.7 % — ABNORMAL HIGH (ref 4.6–6.5)

## 2014-09-10 MED ORDER — METFORMIN HCL 500 MG PO TABS: 500.0000 mg | ORAL_TABLET | Freq: Two times a day (BID) | ORAL | Status: DC

## 2014-09-10 MED ORDER — TRAMADOL HCL 50 MG PO TABS: 50.0000 mg | ORAL_TABLET | Freq: Four times a day (QID) | ORAL | Status: DC | PRN

## 2014-09-10 MED ORDER — GLUCOSE BLOOD VI STRP: ORAL_STRIP | Status: DC

## 2014-09-10 MED ORDER — ACCU-CHEK SOFTCLIX LANCETS MISC: Status: DC

## 2014-09-10 NOTE — Addendum Note (Signed)
Addended by: Marcina Millard on: 09/10/2014 10:22 AM   Modules accepted: Orders

## 2014-09-10 NOTE — Addendum Note (Signed)
Addended by: Marcina Millard on: 09/10/2014 10:24 AM   Modules accepted: Orders, Medications

## 2014-09-10 NOTE — Patient Instructions (Signed)
Diabetes Mellitus and Food It is important for you to manage your blood sugar (glucose) level. Your blood glucose level can be greatly affected by what you eat. Eating healthier foods in the appropriate amounts throughout the day at about the same time each day will help you control your blood glucose level. It can also help slow or prevent worsening of your diabetes mellitus. Healthy eating may even help you improve the level of your blood pressure and reach or maintain a healthy weight.  HOW CAN FOOD AFFECT ME? Carbohydrates Carbohydrates affect your blood glucose level more than any other type of food. Your dietitian will help you determine how many carbohydrates to eat at each meal and teach you how to count carbohydrates. Counting carbohydrates is important to keep your blood glucose at a healthy level, especially if you are using insulin or taking certain medicines for diabetes mellitus. Alcohol Alcohol can cause sudden decreases in blood glucose (hypoglycemia), especially if you use insulin or take certain medicines for diabetes mellitus. Hypoglycemia can be a life-threatening condition. Symptoms of hypoglycemia (sleepiness, dizziness, and disorientation) are similar to symptoms of having too much alcohol.  If your health care provider has given you approval to drink alcohol, do so in moderation and use the following guidelines:  Women should not have more than one drink per day, and men should not have more than two drinks per day. One drink is equal to:  12 oz of beer.  5 oz of wine.  1 oz of hard liquor.  Do not drink on an empty stomach.  Keep yourself hydrated. Have water, diet soda, or unsweetened iced tea.  Regular soda, juice, and other mixers might contain a lot of carbohydrates and should be counted. WHAT FOODS ARE NOT RECOMMENDED? As you make food choices, it is important to remember that all foods are not the same. Some foods have fewer nutrients per serving than other  foods, even though they might have the same number of calories or carbohydrates. It is difficult to get your body what it needs when you eat foods with fewer nutrients. Examples of foods that you should avoid that are high in calories and carbohydrates but low in nutrients include:  Trans fats (most processed foods list trans fats on the Nutrition Facts label).  Regular soda.  Juice.  Candy.  Sweets, such as cake, pie, doughnuts, and cookies.  Fried foods. WHAT FOODS CAN I EAT? Have nutrient-rich foods, which will nourish your body and keep you healthy. The food you should eat also will depend on several factors, including:  The calories you need.  The medicines you take.  Your weight.  Your blood glucose level.  Your blood pressure level.  Your cholesterol level. You also should eat a variety of foods, including:  Protein, such as meat, poultry, fish, tofu, nuts, and seeds (lean animal proteins are best).  Fruits.  Vegetables.  Dairy products, such as milk, cheese, and yogurt (low fat is best).  Breads, grains, pasta, cereal, rice, and beans.  Fats such as olive oil, trans fat-free margarine, canola oil, avocado, and olives. DOES EVERYONE WITH DIABETES MELLITUS HAVE THE SAME MEAL PLAN? Because every person with diabetes mellitus is different, there is not one meal plan that works for everyone. It is very important that you meet with a dietitian who will help you create a meal plan that is just right for you. Document Released: 03/09/2005 Document Revised: 06/17/2013 Document Reviewed: 05/09/2013 [employer] Patient Information 2015 [employer]. This   information is not intended to replace advice given to you by your health care provider. Make sure you discuss any questions you have with your health care provider. Type 2 Diabetes Mellitus Type 2 diabetes mellitus, often simply referred to as type 2 diabetes, is a long-lasting (chronic) disease. In type 2 diabetes, the  pancreas does not make enough insulin (a hormone), the cells are less responsive to the insulin that is made (insulin resistance), or both. Normally, insulin moves sugars from food into the tissue cells. The tissue cells use the sugars for energy. The lack of insulin or the lack of normal response to insulin causes excess sugars to build up in the blood instead of going into the tissue cells. As a result, high blood sugar (hyperglycemia) develops. The effect of high sugar (glucose) levels can cause many complications. Type 2 diabetes was also previously called adult-onset diabetes, but it can occur at any age.  RISK FACTORS  A person is predisposed to developing type 2 diabetes if someone in the family has the disease and also has one or more of the following primary risk factors:  Overweight.  An inactive lifestyle.  A history of consistently eating high-calorie foods. Maintaining a normal weight and regular physical activity can reduce the chance of developing type 2 diabetes. SYMPTOMS  A person with type 2 diabetes may not show symptoms initially. The symptoms of type 2 diabetes appear slowly. The symptoms include:  Increased thirst (polydipsia).  Increased urination (polyuria).  Increased urination during the night (nocturia).  Weight loss. This weight loss may be rapid.  Frequent, recurring infections.  Tiredness (fatigue).  Weakness.  Vision changes, such as blurred vision.  Fruity smell to your breath.  Abdominal pain.  Nausea or vomiting.  Cuts or bruises which are slow to heal.  Tingling or numbness in the hands or feet. DIAGNOSIS Type 2 diabetes is frequently not diagnosed until complications of diabetes are present. Type 2 diabetes is diagnosed when symptoms or complications are present and when blood glucose levels are increased. Your blood glucose level may be checked by one or more of the following blood tests:  A fasting blood glucose test. You will not be  allowed to eat for at least 8 hours before a blood sample is taken.  A random blood glucose test. Your blood glucose is checked at any time of the day regardless of when you ate.  A hemoglobin A1c blood glucose test. A hemoglobin A1c test provides information about blood glucose control over the previous 3 months.  An oral glucose tolerance test (OGTT). Your blood glucose is measured after you have not eaten (fasted) for 2 hours and then after you drink a glucose-containing beverage. TREATMENT   You may need to take insulin or diabetes medicine daily to keep blood glucose levels in the desired range.  If you use insulin, you may need to adjust the dosage depending on the carbohydrates that you eat with each meal or snack. The treatment goal is to maintain the before meal blood sugar (preprandial glucose) level at 70-130 mg/dL. HOME CARE INSTRUCTIONS   Have your hemoglobin A1c level checked twice a year.  Perform daily blood glucose monitoring as directed by your health care provider.  Monitor urine ketones when you are ill and as directed by your health care provider.  Take your diabetes medicine or insulin as directed by your health care provider to maintain your blood glucose levels in the desired range.  Never run out of diabetes  medicine or insulin. It is needed every day.  If you are using insulin, you may need to adjust the amount of insulin given based on your intake of carbohydrates. Carbohydrates can raise blood glucose levels but need to be included in your diet. Carbohydrates provide vitamins, minerals, and fiber which are an essential part of a healthy diet. Carbohydrates are found in fruits, vegetables, whole grains, dairy products, legumes, and foods containing added sugars.  Eat healthy foods. You should make an appointment to see a registered dietitian to help you create an eating plan that is right for you.  Lose weight if you are overweight.  Carry a medical alert  card or wear your medical alert jewelry.  Carry a 15-gram carbohydrate snack with you at all times to treat low blood glucose (hypoglycemia). Some examples of 15-gram carbohydrate snacks include:  Glucose tablets, 3 or 4.  Glucose gel, 15-gram tube.  Raisins, 2 tablespoons (24 grams).  Jelly beans, 6.  Animal crackers, 8.  Regular pop, 4 ounces (120 mL).  Gummy treats, 9.  Recognize hypoglycemia. Hypoglycemia occurs with blood glucose levels of 70 mg/dL and below. The risk for hypoglycemia increases when fasting or skipping meals, during or after intense exercise, and during sleep. Hypoglycemia symptoms can include:  Tremors or shakes.  Decreased ability to concentrate.  Sweating.  Increased heart rate.  Headache.  Dry mouth.  Hunger.  Irritability.  Anxiety.  Restless sleep.  Altered speech or coordination.  Confusion.  Treat hypoglycemia promptly. If you are alert and able to safely swallow, follow the 15:15 rule:  Take 15-20 grams of rapid-acting glucose or carbohydrate. Rapid-acting options include glucose gel, glucose tablets, or 4 ounces (120 mL) of fruit juice, regular soda, or low-fat milk.  Check your blood glucose level 15 minutes after taking the glucose.  Take 15-20 grams more of glucose if the repeat blood glucose level is still 70 mg/dL or below.  Eat a meal or snack within 1 hour once blood glucose levels return to normal.  Be alert to feeling very thirsty and urinating more frequently than usual, which are early signs of hyperglycemia. An early awareness of hyperglycemia allows for prompt treatment. Treat hyperglycemia as directed by your health care provider.  Engage in at least 150 minutes of moderate-intensity physical activity a week, spread over at least 3 days of the week or as directed by your health care provider. In addition, you should engage in resistance exercise at least 2 times a week or as directed by your health care provider.  Try to spend no more than 90 minutes at one time inactive.  Adjust your medicine and food intake as needed if you start a new exercise or sport.  Follow your sick-day plan anytime you are unable to eat or drink as usual.  Do not use any tobacco products including cigarettes, chewing tobacco, or electronic cigarettes. If you need help quitting, ask your health care provider.  Limit alcohol intake to no more than 1 drink per day for nonpregnant women and 2 drinks per day for men. You should drink alcohol only when you are also eating food. Talk with your health care provider whether alcohol is safe for you. Tell your health care provider if you drink alcohol several times a week.  Keep all follow-up visits as directed by your health care provider. This is important.  Schedule an eye exam soon after the diagnosis of type 2 diabetes and then annually.  Perform daily skin and foot care.  Examine your skin and feet daily for cuts, bruises, redness, nail problems, bleeding, blisters, or sores. A foot exam by a health care provider should be done annually.  Brush your teeth and gums at least twice a day and floss at least once a day. Follow up with your dentist regularly.  Share your diabetes management plan with your workplace or school.  Stay up-to-date with immunizations. It is recommended that people with diabetes who are over 39 years old get the pneumonia vaccine. In some cases, two separate shots may be given. Ask your health care provider if your pneumonia vaccination is up-to-date.  Learn to manage stress.  Obtain ongoing diabetes education and support as needed.  Participate in or seek rehabilitation as needed to maintain or improve independence and quality of life. Request a physical or occupational therapy referral if you are having foot or hand numbness, or difficulties with grooming, dressing, eating, or physical activity. SEEK MEDICAL CARE IF:   You are unable to eat food or drink  fluids for more than 6 hours.  You have nausea and vomiting for more than 6 hours.  Your blood glucose level is over 240 mg/dL.  There is a change in mental status.  You develop an additional serious illness.  You have diarrhea for more than 6 hours.  You have been sick or have had a fever for a couple of days and are not getting better.  You have pain during any physical activity.  SEEK IMMEDIATE MEDICAL CARE IF:  You have difficulty breathing.  You have moderate to large ketone levels. MAKE SURE YOU:  Understand these instructions.  Will watch your condition.  Will get help right away if you are not doing well or get worse. Document Released: 06/12/2005 Document Revised: 10/27/2013 Document Reviewed: 01/09/2012 [facility] Patient Information 2015 [place], [place]. This information is not intended to replace advice given to you by your health care provider. Make sure you discuss any questions you have with your health care provider.

## 2014-09-10 NOTE — Progress Notes (Signed)
Pre visit review using our clinic review tool, if applicable. No additional management support is needed unless otherwise documented below in the visit note. 

## 2014-09-10 NOTE — Progress Notes (Signed)
Subjective:    Patient ID: [name] [name], male    DOB: [birth year], 54 y.o.   MRN: [id number]  HPI Patient here for complete physical. He has history of metabolic syndrome and obesity. He just recently noted some increased thirst. He has lost some weight which he treated his efforts. He has some urine frequency. No blurred vision. Glucose 336 on recent fasting blood work for physical. Very strong family history of type 2 diabetes in both parents as well as siblings.  Family history of colon cancer in his mother. He had most recent colonoscopy September 2011. Will be due later this year.  He's had some ongoing back pains following neck surgery. He's tried to follow-up with neurosurgeon but his had difficulty getting appointment. His current pain that was not cervical but more thoracic area. Tried Aleve and over-the-counter medications without relief. He has significant night pain. No pleuritic pain. No cough. No fevers or chills. No chest pains.  Past Medical History  Diagnosis Date  . Asthma   . Hx of colonic polyps   . GERD (gastroesophageal reflux disease)   . Blood in stool   . Hay fever   . Migraines   . COPD (chronic obstructive pulmonary disease)   . Arthritis   . PONV (postoperative nausea and vomiting)     also history of motion sickness   Past Surgical History  Procedure Laterality Date  . Appendectomy  1984  . Nasal septum surgery  2008  . Knee arthroscopy      right  . Anterior cervical decomp/discectomy fusion  02/20/2012    Procedure: ANTERIOR CERVICAL DECOMPRESSION/DISCECTOMY FUSION 1 LEVEL;  Surgeon: Erline Levine, MD;  Location: [place] [facility];  Service: Neurosurgery;  Laterality: N/A;  Cervical six - seven  Anterior cervical decompression/diskectomy/fusion    reports that he has quit smoking. He does not have any smokeless tobacco history on file. He reports that he drinks alcohol. He reports that he does not use illicit drugs. family history includes Cancer in  his maternal grandmother, maternal uncle, maternal uncle, maternal uncle, maternal uncle, maternal uncle, maternal uncle, and mother; Diabetes in his brother, father, mother, and paternal grandfather. No Known Allergies    Review of Systems  Constitutional: Negative for fever, chills, activity change, appetite change and fatigue.  HENT: Negative for congestion, ear pain and trouble swallowing.   Eyes: Negative for pain and visual disturbance.  Respiratory: Negative for cough, shortness of breath and wheezing.   Cardiovascular: Negative for chest pain and palpitations.  Gastrointestinal: Negative for nausea, vomiting, abdominal pain, diarrhea, constipation, blood in stool, abdominal distention and rectal pain.  Endocrine: Positive for polydipsia and polyuria.  Genitourinary: Negative for dysuria, hematuria and testicular pain.  Musculoskeletal: Positive for back pain. Negative for joint swelling and arthralgias.  Skin: Negative for rash.  Neurological: Negative for dizziness, syncope and headaches.  Hematological: Negative for adenopathy.  Psychiatric/Behavioral: Negative for confusion and dysphoric mood.       Objective:   Physical Exam  Constitutional: He is oriented to person, place, and time. He appears well-developed and well-nourished. No distress.  HENT:  Head: Normocephalic and atraumatic.  Right Ear: External ear normal.  Left Ear: External ear normal.  Mouth/Throat: Oropharynx is clear and moist.  Eyes: Conjunctivae and EOM are normal. Pupils are equal, round, and reactive to light.  Neck: Normal range of motion. Neck supple. No thyromegaly present.  Cardiovascular: Normal rate, regular rhythm and normal heart sounds.   No murmur heard. Pulmonary/Chest:  No respiratory distress. He has no wheezes. He has no rales.  Abdominal: Soft. Bowel sounds are normal. He exhibits no distension and no mass. There is no tenderness. There is no rebound and no guarding.  Musculoskeletal:  He exhibits no edema.  Lymphadenopathy:    He has no cervical adenopathy.  Neurological: He is alert and oriented to person, place, and time. He displays normal reflexes. No cranial nerve deficit.  Skin: No rash noted.  Psychiatric: He has a normal mood and affect.          Assessment & Plan:  #1 Complete physical. Tetanus up-to-date. He will be due for repeat colonoscopy later this year. He is strongly encouraged to lose weight #2 dyslipidemia. Mildly elevated triglycerides. Should improve with blood sugar control #3 type 2 diabetes newly diagnosed. Poor control. Repeat fasting blood sugar today 251. Start metformin 500 mg twice daily. Offer diabetes educator but he declines. Diet discussed in some detail. Reassess one month. Get baseline A1c today #4 back pain. Poorly localized upper back. Not responsive to nonsteroidals. Short-term trial tramadol 50 mg 1-2 every 6 hours as needed.

## 2014-09-14 ENCOUNTER — Other Ambulatory Visit: Payer: Self-pay

## 2014-09-14 ENCOUNTER — Telehealth: Payer: Self-pay | Admitting: Family Medicine

## 2014-09-14 MED ORDER — ACCU-CHEK SOFTCLIX LANCETS MISC: Status: DC

## 2014-09-14 MED ORDER — MONTELUKAST SODIUM 10 MG PO TABS: 10.0000 mg | ORAL_TABLET | Freq: Every day | ORAL | Status: DC

## 2014-09-14 MED ORDER — AZELASTINE HCL 0.1 % NA SOLN: 1.0000 | Freq: Two times a day (BID) | NASAL | Status: DC

## 2014-09-14 MED ORDER — BECLOMETHASONE DIPROPIONATE 80 MCG/ACT IN AERS: 2.0000 | INHALATION_SPRAY | Freq: Two times a day (BID) | RESPIRATORY_TRACT | Status: DC

## 2014-09-14 MED ORDER — TRUEPLUS LANCETS 30G MISC: Status: DC

## 2014-09-14 MED ORDER — LEVOCETIRIZINE DIHYDROCHLORIDE 5 MG PO TABS: 5.0000 mg | ORAL_TABLET | Freq: Every evening | ORAL | Status: DC

## 2014-09-14 MED ORDER — GLUCOSE BLOOD VI STRP
ORAL_STRIP | Status: DC
Start: 2014-09-14 — End: 2016-05-08

## 2014-09-14 MED ORDER — GLUCOSE BLOOD VI STRP: ORAL_STRIP | Status: DC

## 2014-09-14 MED ORDER — OMEPRAZOLE 40 MG PO CPDR: 40.0000 mg | DELAYED_RELEASE_CAPSULE | Freq: Every day | ORAL | Status: DC | PRN

## 2014-09-14 NOTE — Telephone Encounter (Signed)
Pt states he went to pick up medications from [facility] and was informed they only had only recived the rx for metFORMIN (GLUCOPHAGE) 500 MG tablet.  Pt states all of his medications should have been sent to pharmacy.  Please contact pharmacy to confirm receipt of other refills.

## 2014-09-14 NOTE — Telephone Encounter (Signed)
Received a fax from [place] to changed to True Test strips and Lancets due to cheaper price.  Rx changed and sent to pharmacy.

## 2014-09-14 NOTE — Telephone Encounter (Signed)
Rx sent to pharmacy   

## 2014-09-26 ENCOUNTER — Ambulatory Visit: Payer: 59

## 2014-12-31 ENCOUNTER — Telehealth: Payer: Self-pay | Admitting: *Deleted

## 2014-12-31 DIAGNOSIS — E1165 Type 2 diabetes mellitus with hyperglycemia: Secondary | ICD-10-CM

## 2014-12-31 DIAGNOSIS — IMO0002 Reserved for concepts with insufficient information to code with codable children: Secondary | ICD-10-CM

## 2014-12-31 NOTE — Telephone Encounter (Signed)
Spoke with patient and he will call back to schedule a lab appointment. a1c ordered Diabetic bundle

## 2015-01-25 ENCOUNTER — Encounter: Payer: Self-pay | Admitting: Family Medicine

## 2015-01-25 ENCOUNTER — Ambulatory Visit: Payer: 59 | Admitting: Family Medicine

## 2015-01-25 ENCOUNTER — Ambulatory Visit (INDEPENDENT_AMBULATORY_CARE_PROVIDER_SITE_OTHER): Payer: 59 | Admitting: Family Medicine

## 2015-01-25 VITALS — BP 130/80 | HR 82 | Temp 98.4°F | Wt 288.0 lb

## 2015-01-25 DIAGNOSIS — H9192 Unspecified hearing loss, left ear: Secondary | ICD-10-CM

## 2015-01-25 DIAGNOSIS — IMO0002 Reserved for concepts with insufficient information to code with codable children: Secondary | ICD-10-CM

## 2015-01-25 DIAGNOSIS — E1165 Type 2 diabetes mellitus with hyperglycemia: Secondary | ICD-10-CM

## 2015-01-25 MED ORDER — PREDNISONE 10 MG PO TABS: ORAL_TABLET | ORAL | Status: DC

## 2015-01-25 NOTE — Progress Notes (Signed)
Pre visit review using our clinic review tool, if applicable. No additional management support is needed unless otherwise documented below in the visit note. 

## 2015-01-25 NOTE — Progress Notes (Signed)
Subjective:    Patient ID: [name] [name], male    DOB: 1961/02/25, 54 y.o.   MRN: [id number]  HPI Patient seen for acute visit. He states this past Saturday he noticed some acute worsening of ringing in the left ear along with subjective decrease loss of hearing the left ear. 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 He denies any recent vertigo. He has had some sinus congestion and nasal stuffiness past few days. Occasional dry cough. No fevers or chills. No ear pain. No drainage.  Type 2 diabetes diagnosed several months ago. Started metformin 500 milligrams twice daily. Compliant with therapy. Home blood sugars mostly ranging 120-130 fasting. No symptoms of polyuria or polydipsia.  Past Medical History  Diagnosis Date  . Asthma   . Hx of colonic polyps   . GERD (gastroesophageal reflux disease)   . Blood in stool   . Hay fever   . Migraines   . COPD (chronic obstructive pulmonary disease)   . Arthritis   . PONV (postoperative nausea and vomiting)     also history of motion sickness  . Diabetes mellitus without complication 3/54    type 2   Past Surgical History  Procedure Laterality Date  . Appendectomy  1984  . Nasal septum surgery  2008  . Knee arthroscopy      right  . Anterior cervical decomp/discectomy fusion  02/20/2012    Procedure: ANTERIOR CERVICAL DECOMPRESSION/DISCECTOMY FUSION 1 LEVEL;  Surgeon: Erline Levine, MD;  Location: [place] [facility];  Service: Neurosurgery;  Laterality: N/A;  Cervical six - seven  Anterior cervical decompression/diskectomy/fusion    reports that he has quit smoking. He does not have any smokeless tobacco history on file. He reports that he drinks alcohol. He reports that he does not use illicit drugs. family history includes Cancer in his maternal grandmother, maternal uncle, maternal uncle, maternal uncle, maternal uncle, maternal uncle, maternal uncle, and mother; Diabetes in his brother,  father, mother, and paternal grandfather. No Known Allergies    Review of Systems  Constitutional: Negative for fever and chills.  HENT: Positive for congestion and hearing loss. Negative for ear discharge and ear pain.   Respiratory: Positive for cough.   Cardiovascular: Negative for chest pain.  Endocrine: Negative for polydipsia and polyuria.  Neurological: Negative for dizziness, syncope and headaches.       Objective:   Physical Exam  Constitutional: He is oriented to person, place, and time. He appears well-developed and well-nourished.  HENT:  Right Ear: External ear normal.  Left Ear: External ear normal.  Neck: Neck supple. No thyromegaly present.  Lymphadenopathy:    He has no cervical adenopathy.  Neurological: He is alert and oriented to person, place, and time. No cranial nerve deficit.          Assessment & Plan:  #1 question of hearing loss left ear. Using hand-held device he had impairment at higher frequency of 4000 Hz both in the right and left ear but was able to hear at other frequencies-so no obvious changes between the right and left ear today. He has no cerumen and no obvious evidence for effusion. Doubt sudden sensorineural hearing loss but with his significant nasal congestion go ahead and start prednisone taper with caution in this may raise his blood sugars short-term. Consider ENT referral if symptoms not improving over the next few days #2 type 2 diabetes. Diagnosed several months ago. Started on metformin and  he has made some major dietary changes. He has limited sodas. Recheck A1c today and titrate metformin if necessary

## 2015-01-25 NOTE — Patient Instructions (Signed)
Please let me know if ringing and hearing changes are not better in one week.

## 2015-01-26 ENCOUNTER — Encounter: Payer: Self-pay | Admitting: Internal Medicine

## 2015-01-26 LAB — HEMOGLOBIN A1C: HEMOGLOBIN A1C: 7.7 % — AB (ref 4.6–6.5)

## 2015-01-27 ENCOUNTER — Other Ambulatory Visit: Payer: Self-pay | Admitting: Family Medicine

## 2015-01-27 ENCOUNTER — Other Ambulatory Visit: Payer: Self-pay

## 2015-01-27 DIAGNOSIS — IMO0002 Reserved for concepts with insufficient information to code with codable children: Secondary | ICD-10-CM

## 2015-01-27 DIAGNOSIS — E1165 Type 2 diabetes mellitus with hyperglycemia: Secondary | ICD-10-CM

## 2015-01-27 MED ORDER — METFORMIN HCL 500 MG PO TABS: 1000.0000 mg | ORAL_TABLET | Freq: Two times a day (BID) | ORAL | Status: DC

## 2015-03-18 ENCOUNTER — Ambulatory Visit: Payer: 59 | Admitting: Family Medicine

## 2015-04-01 ENCOUNTER — Ambulatory Visit (INDEPENDENT_AMBULATORY_CARE_PROVIDER_SITE_OTHER): Payer: 59 | Admitting: Family Medicine

## 2015-04-01 ENCOUNTER — Encounter: Payer: Self-pay | Admitting: Family Medicine

## 2015-04-01 VITALS — BP 122/84 | HR 94 | Temp 98.0°F | Ht 73.0 in | Wt 291.8 lb

## 2015-04-01 DIAGNOSIS — IMO0002 Reserved for concepts with insufficient information to code with codable children: Secondary | ICD-10-CM

## 2015-04-01 DIAGNOSIS — H8112 Benign paroxysmal vertigo, left ear: Secondary | ICD-10-CM

## 2015-04-01 DIAGNOSIS — H9319 Tinnitus, unspecified ear: Secondary | ICD-10-CM | POA: Diagnosis not present

## 2015-04-01 DIAGNOSIS — Z23 Encounter for immunization: Secondary | ICD-10-CM

## 2015-04-01 DIAGNOSIS — E1165 Type 2 diabetes mellitus with hyperglycemia: Secondary | ICD-10-CM

## 2015-04-01 DIAGNOSIS — IMO0001 Reserved for inherently not codable concepts without codable children: Secondary | ICD-10-CM

## 2015-04-01 MED ORDER — PREDNISONE 10 MG PO TABS: ORAL_TABLET | ORAL | Status: DC

## 2015-04-01 MED ORDER — BECLOMETHASONE DIPROPIONATE 80 MCG/ACT NA AERS: 2.0000 | INHALATION_SPRAY | Freq: Every day | NASAL | Status: DC

## 2015-04-01 MED ORDER — METFORMIN HCL ER 750 MG PO TB24: 750.0000 mg | ORAL_TABLET | Freq: Two times a day (BID) | ORAL | Status: DC

## 2015-04-01 NOTE — Progress Notes (Signed)
Pre visit review using our clinic review tool, if applicable. No additional management support is needed unless otherwise documented below in the visit note. 

## 2015-04-01 NOTE — Progress Notes (Signed)
Subjective:    Patient ID: [name] [name], male    DOB: 1960-09-07, 54 y.o.   MRN: [id number]  HPI Patient here with recurrent vertigo. He thinks this is sinus related. He had episode last week around Wednesday of fairly severe vertigo which was worse with head movement to the left. He's also had some recent ringing in both ears. He is not aware of any obvious hearing changes. His tinnitus has been relatively chronic and intermittent. He's had several year history of exposure to loud noises from [employer] and also from firearms.  He was seen back in August with question of some mild hearing loss left ear. We consider possible sudden sensorineural hearing loss but he was also having significant nasal congestion and sinus pressure. We started low-dose prednisone and his symptoms fully resolved. He states his ringing in the ears also fully resolved with the prednisone. He's tried nasal steroid in the past but had nosebleed with Flonase. He was placed about a year ago by ENT in [place] on [address] but does not recall this helping much. Patient has done Epley maneuvers in the past for his vertigo which have helped slightly  Type 2 diabetes. He takes metformin 1000 g twice a day but has had some problems with loose stools and tendency toward diarrhea and abdominal cramping with immediate release metformin. Last A1c 7.7%. CBGs been stable  Past Medical History  Diagnosis Date  . Asthma   . Hx of colonic polyps   . GERD (gastroesophageal reflux disease)   . Blood in stool   . Hay fever   . Migraines   . COPD (chronic obstructive pulmonary disease) ([place])   . Arthritis   . PONV (postoperative nausea and vomiting)     also history of motion sickness  . Diabetes mellitus without complication ([place]) 9/16    type 2   Past Surgical History  Procedure Laterality Date  . Appendectomy  1984  . Nasal septum surgery  2008  . Knee arthroscopy      right  . Anterior cervical  decomp/discectomy fusion  02/20/2012    Procedure: ANTERIOR CERVICAL DECOMPRESSION/DISCECTOMY FUSION 1 LEVEL;  Surgeon: Erline Levine, MD;  Location: [facility];  Service: Neurosurgery;  Laterality: N/A;  Cervical six - seven  Anterior cervical decompression/diskectomy/fusion    reports that he has quit smoking. He does not have any smokeless tobacco history on file. He reports that he drinks alcohol. He reports that he does not use illicit drugs. family history includes Cancer in his maternal grandmother, maternal uncle, maternal uncle, maternal uncle, maternal uncle, maternal uncle, maternal uncle, and mother; Diabetes in his brother, father, mother, and paternal grandfather. No Known Allergies    Review of Systems  Constitutional: Negative for fatigue.  HENT: Positive for congestion. Negative for hearing loss.   Eyes: Negative for visual disturbance.  Respiratory: Negative for cough, chest tightness and shortness of breath.   Cardiovascular: Negative for chest pain, palpitations and leg swelling.  Gastrointestinal: Negative for abdominal pain.  Neurological: Positive for dizziness. Negative for syncope, weakness, light-headedness and headaches.       Objective:   Physical Exam  Constitutional: He is oriented to person, place, and time. He appears well-developed and well-nourished.  HENT:  Right Ear: External ear normal.  Left Ear: External ear normal.  Neck: Neck supple. No thyromegaly present.  Cardiovascular: Normal rate and regular rhythm.   Pulmonary/Chest: Effort normal and breath sounds normal. No respiratory distress. He has no wheezes.  He has no rales.  Neurological: He is alert and oriented to person, place, and time. No cranial nerve deficit. Coordination normal.  Full strength throughout Patient had reproducible vertigo with lying supine with head to the left          Assessment & Plan:  #1 type 2 diabetes. History of fair control. Tendency toward loose stools  with metformin. Change to metformin extended release 750 mg twice daily. Reassess A1c 2-3 months #2 recurrent vertigo. Suspect benign peripheral positional vertigo to the left. Continue rehabilitation maneuvers as above #3 chronic intermittent tinnitus. Suspect related to chronic exposure to loud noises. Set up ENT referral

## 2015-04-02 ENCOUNTER — Encounter: Payer: Self-pay | Admitting: Family Medicine

## 2015-04-05 ENCOUNTER — Other Ambulatory Visit: Payer: Self-pay | Admitting: Family Medicine

## 2015-04-05 MED ORDER — MECLIZINE HCL 25 MG PO TABS
25.0000 mg | ORAL_TABLET | Freq: Three times a day (TID) | ORAL | Status: DC | PRN
Start: 2015-04-05 — End: 2016-05-08

## 2015-08-04 ENCOUNTER — Ambulatory Visit (INDEPENDENT_AMBULATORY_CARE_PROVIDER_SITE_OTHER): Payer: 59 | Admitting: Family Medicine

## 2015-08-04 VITALS — BP 110/80 | HR 85 | Temp 97.8°F | Ht 73.0 in | Wt 287.0 lb

## 2015-08-04 DIAGNOSIS — S39012A Strain of muscle, fascia and tendon of lower back, initial encounter: Secondary | ICD-10-CM

## 2015-08-04 MED ORDER — METFORMIN HCL ER 750 MG PO TB24: 750.0000 mg | ORAL_TABLET | Freq: Two times a day (BID) | ORAL | Status: DC

## 2015-08-04 MED ORDER — CYCLOBENZAPRINE HCL 5 MG PO TABS: ORAL_TABLET | ORAL | Status: DC

## 2015-08-04 NOTE — Progress Notes (Signed)
Subjective:    Patient ID: [name] [name], male    DOB: 1960/10/28, 55 y.o.   MRN: [id number]  HPI Patient seen for acute visit with right lower lumbar back pain. Onset Monday 2 days ago while sitting in his chair at work. No injury. He has achy pain which sometimes 8-9 out of 10 severity. Worse with sitting and also has noticed worse with sneezing and changing positions. No radiculopathy symptoms. No weakness. Has tried heat and Aleve with minimal relief. He's had previous cervical neck surgery but no history of lumbar problems.  He was initially concerned he may be having some type of hip problem but he has not had any anterior or medial or lateral hip pain.  Past Medical History  Diagnosis Date  . Asthma   . Hx of colonic polyps   . GERD (gastroesophageal reflux disease)   . Blood in stool   . Hay fever   . Migraines   . COPD (chronic obstructive pulmonary disease) ([place])   . Arthritis   . PONV (postoperative nausea and vomiting)     also history of motion sickness  . Diabetes mellitus without complication ([place]) 0000000    type 2   Past Surgical History  Procedure Laterality Date  . Appendectomy  1984  . Nasal septum surgery  2008  . Knee arthroscopy      right  . Anterior cervical decomp/discectomy fusion  02/20/2012    Procedure: ANTERIOR CERVICAL DECOMPRESSION/DISCECTOMY FUSION 1 LEVEL;  Surgeon: Erline Levine, MD;  Location: [facility];  Service: Neurosurgery;  Laterality: N/A;  Cervical six - seven  Anterior cervical decompression/diskectomy/fusion    reports that he has quit smoking. He does not have any smokeless tobacco history on file. He reports that he drinks alcohol. He reports that he does not use illicit drugs. family history includes Cancer in his maternal grandmother, maternal uncle, maternal uncle, maternal uncle, maternal uncle, maternal uncle, maternal uncle, and mother; Diabetes in his brother, father, mother, and paternal grandfather. No Known  Allergies    Review of Systems  Constitutional: Negative for fever, activity change, appetite change and unexpected weight change.  Respiratory: Negative for cough and shortness of breath.   Cardiovascular: Negative for chest pain and leg swelling.  Gastrointestinal: Negative for vomiting and abdominal pain.  Genitourinary: Negative for dysuria, hematuria and flank pain.  Musculoskeletal: Positive for back pain. Negative for joint swelling.  Neurological: Negative for weakness and numbness.       Objective:   Physical Exam  Constitutional: He is oriented to person, place, and time. He appears well-developed and well-nourished. No distress.  Neck: No thyromegaly present.  Cardiovascular: Normal rate, regular rhythm and normal heart sounds.   No murmur heard. Pulmonary/Chest: Effort normal and breath sounds normal. No respiratory distress. He has no wheezes. He has no rales.  Musculoskeletal: He exhibits no edema.  Straight leg raise is negative on the right. No spinal tenderness. He has full range of motion right hip  Neurological: He is alert and oriented to person, place, and time. He has normal reflexes. No cranial nerve deficit.  He has trace reflexes knee and ankle bilaterally. No weakness with plantar flexion or dorsiflexion  Skin: No rash noted.          Assessment & Plan:  Right lumbar back pain. Nonfocal exam neurologically. Suspect muscula strain and less likely disc pathology. We recommended continued heat and/or ice. Continue Aleve. Consider Flexeril 5-10 mg daily at bedtime when necessary.  Follow-up for any persistent pain or any new symptoms such as weakness or numbness or any progressive pain

## 2015-08-04 NOTE — Patient Instructions (Signed)

## 2015-08-04 NOTE — Progress Notes (Signed)
Pre visit review using our clinic review tool, if applicable. No additional management support is needed unless otherwise documented below in the visit note. 

## 2015-10-05 ENCOUNTER — Other Ambulatory Visit: Payer: Self-pay | Admitting: General Practice

## 2015-10-05 MED ORDER — LEVOCETIRIZINE DIHYDROCHLORIDE 5 MG PO TABS: 5.0000 mg | ORAL_TABLET | Freq: Every evening | ORAL | Status: DC

## 2015-10-24 DIAGNOSIS — H524 Presbyopia: Secondary | ICD-10-CM | POA: Diagnosis not present

## 2016-01-30 ENCOUNTER — Telehealth: Payer: 59 | Admitting: Nurse Practitioner

## 2016-01-30 DIAGNOSIS — J0101 Acute recurrent maxillary sinusitis: Secondary | ICD-10-CM

## 2016-01-30 MED ORDER — AZITHROMYCIN 250 MG PO TABS: ORAL_TABLET | ORAL | 0 refills | Status: DC

## 2016-01-30 NOTE — Progress Notes (Signed)

## 2016-05-04 ENCOUNTER — Other Ambulatory Visit (INDEPENDENT_AMBULATORY_CARE_PROVIDER_SITE_OTHER): Payer: 59

## 2016-05-04 DIAGNOSIS — R7989 Other specified abnormal findings of blood chemistry: Secondary | ICD-10-CM

## 2016-05-04 DIAGNOSIS — Z0000 Encounter for general adult medical examination without abnormal findings: Secondary | ICD-10-CM

## 2016-05-04 LAB — HEPATIC FUNCTION PANEL
ALBUMIN: 4.4 g/dL (ref 3.5–5.2)
ALK PHOS: 65 U/L (ref 39–117)
ALT: 50 U/L (ref 0–53)
AST: 27 U/L (ref 0–37)
BILIRUBIN TOTAL: 0.6 mg/dL (ref 0.2–1.2)
Bilirubin, Direct: 0.1 mg/dL (ref 0.0–0.3)
Total Protein: 6.9 g/dL (ref 6.0–8.3)

## 2016-05-04 LAB — CBC WITH DIFFERENTIAL/PLATELET
BASOS ABS: 0.1 10*3/uL (ref 0.0–0.1)
Basophils Relative: 2 % (ref 0.0–3.0)
EOS PCT: 3.9 % (ref 0.0–5.0)
Eosinophils Absolute: 0.2 10*3/uL (ref 0.0–0.7)
HCT: 46.3 % (ref 39.0–52.0)
HEMOGLOBIN: 16 g/dL (ref 13.0–17.0)
Lymphocytes Relative: 27.6 % (ref 12.0–46.0)
Lymphs Abs: 1.4 10*3/uL (ref 0.7–4.0)
MCHC: 34.5 g/dL (ref 30.0–36.0)
MCV: 88 fl (ref 78.0–100.0)
MONOS PCT: 7 % (ref 3.0–12.0)
Monocytes Absolute: 0.3 10*3/uL (ref 0.1–1.0)
NEUTROS PCT: 59.5 % (ref 43.0–77.0)
Neutro Abs: 2.9 10*3/uL (ref 1.4–7.7)
Platelets: 177 10*3/uL (ref 150.0–400.0)
RBC: 5.26 Mil/uL (ref 4.22–5.81)
RDW: 13 % (ref 11.5–15.5)
WBC: 4.9 10*3/uL (ref 4.0–10.5)

## 2016-05-04 LAB — BASIC METABOLIC PANEL
BUN: 13 mg/dL (ref 6–23)
CALCIUM: 9.5 mg/dL (ref 8.4–10.5)
CO2: 27 mEq/L (ref 19–32)
Chloride: 102 mEq/L (ref 96–112)
Creatinine, Ser: 1.05 mg/dL (ref 0.40–1.50)
GFR: 77.49 mL/min (ref >60.00)
Glucose, Bld: 241 mg/dL — ABNORMAL HIGH (ref 70–99)
POTASSIUM: 4.5 meq/L (ref 3.5–5.1)
SODIUM: 136 meq/L (ref 135–145)

## 2016-05-04 LAB — PSA: PSA: 1.14 ng/mL (ref 0.10–4.00)

## 2016-05-04 LAB — LIPID PANEL
CHOL/HDL RATIO: 5
CHOLESTEROL: 180 mg/dL (ref 0–200)
HDL: 36.9 mg/dL — ABNORMAL LOW (ref >39.00)
NonHDL: 143.22
Triglycerides: 253 mg/dL — ABNORMAL HIGH (ref 0.0–149.0)
VLDL: 50.6 mg/dL — AB (ref 0.0–40.0)

## 2016-05-04 LAB — MICROALBUMIN / CREATININE URINE RATIO
Creatinine,U: 176.5 mg/dL
MICROALB/CREAT RATIO: 0.7 mg/g (ref 0.0–30.0)
Microalb, Ur: 1.3 mg/dL (ref 0.0–1.9)

## 2016-05-04 LAB — TSH: TSH: 0.45 u[IU]/mL (ref 0.35–4.50)

## 2016-05-04 LAB — HEMOGLOBIN A1C: HEMOGLOBIN A1C: 9.9 % — AB (ref 4.6–6.5)

## 2016-05-04 LAB — LDL CHOLESTEROL, DIRECT: LDL DIRECT: 118 mg/dL

## 2016-05-08 ENCOUNTER — Encounter: Payer: Self-pay | Admitting: Family Medicine

## 2016-05-08 ENCOUNTER — Ambulatory Visit (INDEPENDENT_AMBULATORY_CARE_PROVIDER_SITE_OTHER): Payer: 59 | Admitting: Family Medicine

## 2016-05-08 VITALS — BP 132/78 | HR 98 | Temp 98.6°F | Ht 73.0 in | Wt 281.0 lb

## 2016-05-08 DIAGNOSIS — Z0000 Encounter for general adult medical examination without abnormal findings: Secondary | ICD-10-CM | POA: Diagnosis not present

## 2016-05-08 DIAGNOSIS — Z23 Encounter for immunization: Secondary | ICD-10-CM | POA: Diagnosis not present

## 2016-05-08 MED ORDER — EMPAGLIFLOZIN 10 MG PO TABS: 10.0000 mg | ORAL_TABLET | Freq: Every day | ORAL | 6 refills | Status: DC

## 2016-05-08 NOTE — Patient Instructions (Signed)
Discontinue the AM dose of Metformin. Continue with weight loss efforts Let's try to get the Jardiance started once daily.

## 2016-05-08 NOTE — Progress Notes (Signed)
Subjective:     Patient ID: [name] [name], male   DOB: 16-Jun-1961, 55 y.o.   MRN: [id number]  HPI  Patient seen for complete physical. His chronic medical problems include history of obesity, type 2 diabetes, dyslipidemia, metabolic syndrome, GERD, asthma, perennial allergies. He's not had prior Pneumovax. He had flu vaccine per work. Tetanus up-to-date. Colonoscopy up-to-date. He's had recent poor compliance with diet and was feeling very poorly several weeks ago and had blood sugars over 300. He scheduled follow-up with dietitian and has made some significant diet changes over recent weeks and now his fasting blood sugars are less than 200. He has lost some weight and feels better overall. He currently takes extended release metformin 750 mgs twice daily but has frequent daytime diarrhea symptoms. With taking this once daily has not seen any severe diarrhea. He is currently only taking metformin for his diabetes.  He is limited in exercise with walking or running or swimming because of arthritis issues. He is considering trying cycling  Past Medical History:  Diagnosis Date  . Arthritis   . Asthma   . Blood in stool   . COPD (chronic obstructive pulmonary disease) ([place])   . Diabetes mellitus without complication ([place]) 0000000   type 2  . GERD (gastroesophageal reflux disease)   . Hay fever   . Hx of colonic polyps   . Migraines   . PONV (postoperative nausea and vomiting)    also history of motion sickness   Past Surgical History:  Procedure Laterality Date  . ANTERIOR CERVICAL DECOMP/DISCECTOMY FUSION  02/20/2012   Procedure: ANTERIOR CERVICAL DECOMPRESSION/DISCECTOMY FUSION 1 LEVEL;  Surgeon: Erline Levine, MD;  Location: [facility];  Service: Neurosurgery;  Laterality: N/A;  Cervical six - seven  Anterior cervical decompression/diskectomy/fusion  . APPENDECTOMY  1984  . KNEE ARTHROSCOPY     right  . NASAL SEPTUM SURGERY  2008    reports that he has quit smoking. He does not  have any smokeless tobacco history on file. He reports that he drinks alcohol. He reports that he does not use drugs. family history includes Cancer in his maternal grandmother, maternal uncle, maternal uncle, maternal uncle, maternal uncle, maternal uncle, maternal uncle, and mother; Diabetes in his brother, father, mother, and paternal grandfather. No Known Allergies  Review of Systems  Constitutional: Negative for activity change, appetite change, fatigue, fever and unexpected weight change.  HENT: Negative for congestion, ear pain and trouble swallowing.   Eyes: Negative for pain and visual disturbance.  Respiratory: Negative for cough, shortness of breath and wheezing.   Cardiovascular: Negative for chest pain and palpitations.  Gastrointestinal: Negative for abdominal distention, abdominal pain, blood in stool, constipation, diarrhea, nausea, rectal pain and vomiting.  Endocrine: Negative for cold intolerance and heat intolerance.  Genitourinary: Negative for dysuria, hematuria and testicular pain.  Musculoskeletal: Positive for arthralgias. Negative for joint swelling.  Skin: Negative for rash.  Neurological: Negative for dizziness, syncope and headaches.  Hematological: Negative for adenopathy.  Psychiatric/Behavioral: Negative for confusion and dysphoric mood.       Objective:   Physical Exam  Constitutional: He is oriented to person, place, and time. He appears well-developed and well-nourished. No distress.  HENT:  Head: Normocephalic and atraumatic.  Right Ear: External ear normal.  Left Ear: External ear normal.  Mouth/Throat: Oropharynx is clear and moist.  Eyes: Conjunctivae and EOM are normal. Pupils are equal, round, and reactive to light.  Neck: Normal range of motion. Neck supple. No thyromegaly  present.  Cardiovascular: Normal rate, regular rhythm and normal heart sounds.   No murmur heard. Pulmonary/Chest: No respiratory distress. He has no wheezes. He has no  rales.  Abdominal: Soft. Bowel sounds are normal. He exhibits no distension and no mass. There is no tenderness. There is no rebound and no guarding.  Musculoskeletal: He exhibits no edema.  Lymphadenopathy:    He has no cervical adenopathy.  Neurological: He is alert and oriented to person, place, and time. He displays normal reflexes. No cranial nerve deficit.  Skin: No rash noted.  Psychiatric: He has a normal mood and affect.       Assessment:     Physical exam. Labs reviewed. He has elevated A1c of 9.9% and dyslipidemia with high triglyceride and low HDL cholesterol. LDL cholesterol greater than 100. He describes some intermittent diarrhea likely related to metformin.      Plan:     -Pneumovax given -Reduce metformin extended release 750 mg to once daily -Start Jardiance 10 mg once daily -Continue dietary changes above and weight loss efforts -We discussed current recommendations to consider statin regardless of cholesterol level in type II diabetics and at this point he declines. He does prefer to recheck lipids at follow-up fasting in a couple months  Eulas Post MD [facility]

## 2016-05-08 NOTE — Addendum Note (Signed)
Addended by: Agnes Lawrence on: 05/08/2016 05:24 PM   Modules accepted: Orders

## 2016-05-09 ENCOUNTER — Encounter: Payer: Self-pay | Admitting: Family Medicine

## 2016-05-10 ENCOUNTER — Other Ambulatory Visit: Payer: Self-pay

## 2016-05-10 MED ORDER — TRUEPLUS LANCETS 33G MISC: 0 refills | Status: DC

## 2016-05-15 ENCOUNTER — Other Ambulatory Visit: Payer: Self-pay

## 2016-05-15 MED ORDER — MONTELUKAST SODIUM 10 MG PO TABS: 10.0000 mg | ORAL_TABLET | Freq: Every day | ORAL | 2 refills | Status: DC

## 2016-05-15 MED ORDER — GLUCOSE BLOOD VI STRP
ORAL_STRIP | 3 refills | Status: DC
Start: 2016-05-15 — End: 2021-11-18

## 2016-05-15 MED ORDER — AZELASTINE-FLUTICASONE 137-50 MCG/ACT NA SUSP: NASAL | 2 refills | Status: DC

## 2016-05-15 MED ORDER — OMEPRAZOLE 40 MG PO CPDR: 40.0000 mg | DELAYED_RELEASE_CAPSULE | Freq: Every day | ORAL | 2 refills | Status: DC | PRN

## 2016-05-15 MED ORDER — METFORMIN HCL ER 750 MG PO TB24: 750.0000 mg | ORAL_TABLET | Freq: Two times a day (BID) | ORAL | 3 refills | Status: DC

## 2016-05-15 MED ORDER — TRUEPLUS LANCETS 33G MISC: 3 refills | Status: DC

## 2016-05-15 MED ORDER — LEVOCETIRIZINE DIHYDROCHLORIDE 5 MG PO TABS: 5.0000 mg | ORAL_TABLET | Freq: Every evening | ORAL | 3 refills | Status: DC

## 2016-05-16 ENCOUNTER — Other Ambulatory Visit: Payer: Self-pay

## 2016-05-16 MED ORDER — TRAMADOL HCL 50 MG PO TABS
50.0000 mg | ORAL_TABLET | Freq: Four times a day (QID) | ORAL | 1 refills | Status: DC | PRN
Start: 2016-05-16 — End: 2020-08-09

## 2016-05-16 MED ORDER — CYCLOBENZAPRINE HCL 5 MG PO TABS: ORAL_TABLET | ORAL | 1 refills | Status: DC

## 2016-07-02 ENCOUNTER — Telehealth: Payer: 59 | Admitting: Family

## 2016-07-02 DIAGNOSIS — J019 Acute sinusitis, unspecified: Secondary | ICD-10-CM | POA: Diagnosis not present

## 2016-07-02 MED ORDER — AMOXICILLIN-POT CLAVULANATE 875-125 MG PO TABS: 1.0000 | ORAL_TABLET | Freq: Two times a day (BID) | ORAL | 0 refills | Status: DC

## 2016-07-02 NOTE — Progress Notes (Signed)

## 2016-11-01 ENCOUNTER — Other Ambulatory Visit: Payer: Self-pay | Admitting: Family Medicine

## 2016-12-28 DIAGNOSIS — H524 Presbyopia: Secondary | ICD-10-CM | POA: Diagnosis not present

## 2017-02-14 ENCOUNTER — Other Ambulatory Visit: Payer: Self-pay | Admitting: Physician Assistant

## 2017-02-14 ENCOUNTER — Encounter: Payer: Self-pay | Admitting: Physician Assistant

## 2017-02-14 ENCOUNTER — Ambulatory Visit (INDEPENDENT_AMBULATORY_CARE_PROVIDER_SITE_OTHER): Payer: 59 | Admitting: Physician Assistant

## 2017-02-14 VITALS — BP 127/89 | HR 83 | Temp 98.4°F | Ht 73.0 in | Wt 276.0 lb

## 2017-02-14 DIAGNOSIS — IMO0001 Reserved for inherently not codable concepts without codable children: Secondary | ICD-10-CM

## 2017-02-14 DIAGNOSIS — J301 Allergic rhinitis due to pollen: Secondary | ICD-10-CM | POA: Diagnosis not present

## 2017-02-14 DIAGNOSIS — J4520 Mild intermittent asthma, uncomplicated: Secondary | ICD-10-CM

## 2017-02-14 DIAGNOSIS — N401 Enlarged prostate with lower urinary tract symptoms: Secondary | ICD-10-CM | POA: Diagnosis not present

## 2017-02-14 DIAGNOSIS — R3914 Feeling of incomplete bladder emptying: Secondary | ICD-10-CM | POA: Diagnosis not present

## 2017-02-14 DIAGNOSIS — E1165 Type 2 diabetes mellitus with hyperglycemia: Secondary | ICD-10-CM | POA: Diagnosis not present

## 2017-02-14 DIAGNOSIS — E8881 Metabolic syndrome: Secondary | ICD-10-CM

## 2017-02-14 DIAGNOSIS — E785 Hyperlipidemia, unspecified: Secondary | ICD-10-CM | POA: Diagnosis not present

## 2017-02-14 DIAGNOSIS — Z800 Family history of malignant neoplasm of digestive organs: Secondary | ICD-10-CM | POA: Diagnosis not present

## 2017-02-14 LAB — BAYER DCA HB A1C WAIVED: HB A1C: 11.2 % — AB (ref <7.0)

## 2017-02-14 MED ORDER — MONTELUKAST SODIUM 10 MG PO TABS: 10.0000 mg | ORAL_TABLET | Freq: Every day | ORAL | 3 refills | Status: DC

## 2017-02-14 MED ORDER — EMPAGLIFLOZIN 10 MG PO TABS: 10.0000 mg | ORAL_TABLET | Freq: Every day | ORAL | 3 refills | Status: DC

## 2017-02-14 MED ORDER — LEVOCETIRIZINE DIHYDROCHLORIDE 5 MG PO TABS: 5.0000 mg | ORAL_TABLET | Freq: Every evening | ORAL | 3 refills | Status: DC

## 2017-02-14 MED ORDER — METFORMIN HCL ER 750 MG PO TB24: 750.0000 mg | ORAL_TABLET | Freq: Two times a day (BID) | ORAL | 3 refills | Status: DC

## 2017-02-14 MED ORDER — TAMSULOSIN HCL 0.4 MG PO CAPS: 0.4000 mg | ORAL_CAPSULE | Freq: Every day | ORAL | 3 refills | Status: DC

## 2017-02-14 NOTE — Patient Instructions (Signed)
In a few days you may receive a survey in the mail or online from [employer] regarding your visit with us today. Please take a moment to fill this out. Your feedback is very important to our whole office. It can help us better understand your needs as well as improve your experience and satisfaction. Thank you for taking your time to complete it. We care about you.  Odai Wimmer, PA-C  

## 2017-02-15 LAB — CBC WITH DIFFERENTIAL/PLATELET
BASOS ABS: 0.1 10*3/uL (ref 0.0–0.2)
BASOS: 2 %
EOS (ABSOLUTE): 0.2 10*3/uL (ref 0.0–0.4)
Eos: 4 %
HEMATOCRIT: 49.6 % (ref 37.5–51.0)
HEMOGLOBIN: 17.1 g/dL (ref 13.0–17.7)
IMMATURE GRANS (ABS): 0 10*3/uL (ref 0.0–0.1)
Immature Granulocytes: 0 %
Lymphocytes Absolute: 1.4 10*3/uL (ref 0.7–3.1)
Lymphs: 26 %
MCH: 29.9 pg (ref 26.6–33.0)
MCHC: 34.5 g/dL (ref 31.5–35.7)
MCV: 87 fL (ref 79–97)
MONOCYTES: 6 %
Monocytes Absolute: 0.3 10*3/uL (ref 0.1–0.9)
NEUTROS ABS: 3.3 10*3/uL (ref 1.4–7.0)
Neutrophils: 62 %
Platelets: 180 10*3/uL (ref 150–379)
RBC: 5.71 x10E6/uL (ref 4.14–5.80)
RDW: 13.4 % (ref 12.3–15.4)
WBC: 5.4 10*3/uL (ref 3.4–10.8)

## 2017-02-15 LAB — CMP14+EGFR
A/G RATIO: 1.6 (ref 1.2–2.2)
ALBUMIN: 4.5 g/dL (ref 3.5–5.5)
ALK PHOS: 85 IU/L (ref 39–117)
ALT: 54 IU/L — ABNORMAL HIGH (ref 0–44)
AST: 36 IU/L (ref 0–40)
BILIRUBIN TOTAL: 0.6 mg/dL (ref 0.0–1.2)
BUN / CREAT RATIO: 13 (ref 9–20)
BUN: 13 mg/dL (ref 6–24)
CHLORIDE: 100 mmol/L (ref 96–106)
CO2: 22 mmol/L (ref 20–29)
Calcium: 9.7 mg/dL (ref 8.7–10.2)
Creatinine, Ser: 1.03 mg/dL (ref 0.76–1.27)
GFR calc non Af Amer: 80 mL/min/{1.73_m2} (ref >59)
GFR, EST AFRICAN AMERICAN: 93 mL/min/{1.73_m2} (ref >59)
GLOBULIN, TOTAL: 2.9 g/dL (ref 1.5–4.5)
GLUCOSE: 279 mg/dL — AB (ref 65–99)
POTASSIUM: 5 mmol/L (ref 3.5–5.2)
SODIUM: 137 mmol/L (ref 134–144)
TOTAL PROTEIN: 7.4 g/dL (ref 6.0–8.5)

## 2017-02-15 LAB — LIPID PANEL
CHOL/HDL RATIO: 5.2 ratio — AB (ref 0.0–5.0)
CHOLESTEROL TOTAL: 193 mg/dL (ref 100–199)
HDL: 37 mg/dL — ABNORMAL LOW (ref >39)
LDL Calculated: 96 mg/dL (ref 0–99)
Triglycerides: 298 mg/dL — ABNORMAL HIGH (ref 0–149)
VLDL CHOLESTEROL CAL: 60 mg/dL — AB (ref 5–40)

## 2017-02-15 LAB — TSH: TSH: 0.473 u[IU]/mL (ref 0.450–4.500)

## 2017-02-16 DIAGNOSIS — R3914 Feeling of incomplete bladder emptying: Secondary | ICD-10-CM

## 2017-02-16 DIAGNOSIS — N401 Enlarged prostate with lower urinary tract symptoms: Secondary | ICD-10-CM | POA: Insufficient documentation

## 2017-02-16 DIAGNOSIS — Z800 Family history of malignant neoplasm of digestive organs: Secondary | ICD-10-CM | POA: Insufficient documentation

## 2017-02-16 NOTE — Progress Notes (Signed)
BP 127/89   Pulse 83   Temp 98.4 F (36.9 C) (Oral)   Ht _0  (1.854 m)   Wt 276 lb (125.2 kg)   BMI 36.41 kg/m    Subjective:    Patient ID: [name] [name], male    DOB: 06-Dec-1960, 56 y.o.   MRN: [id number]  HPI: [name] [name] is a 56 y.o. male presenting on 02/14/2017 for New Patient (Initial Visit) and Establish Care  This patient comes in to to be established, He is a former patient of Dr. Jarold Song. He was in the evening area and works in [place]. This was more convenient area. He has multiple medical problems including type 2 diabetes, asthma, BPH, GERD, degenerative disc with surgical repair not requiring chronic pain medication, allergic rhinitis. He reports overall that he is feeling well. His nocturia is quite significant. He had not been started on any medication for his BPH symptoms. He also has a very strong family history of colon cancer. He has had 5 colonoscopies himself. He had polyps removed in some of them. He is overdue a couple of years for an update. We will plan a referral to Dr. Hilarie Fredrickson for his screening colonoscopy  Relevant past medical, surgical, family and social history reviewed and updated as indicated. Allergies and medications reviewed and updated.  Past Medical History:  Diagnosis Date  . Arthritis   . Asthma   . Blood in stool   . COPD (chronic obstructive pulmonary disease) ([place])   . Diabetes mellitus without complication ([place]) 1/15   type 2  . GERD (gastroesophageal reflux disease)   . Hay fever   . Hx of colonic polyps   . Migraines   . PONV (postoperative nausea and vomiting)    also history of motion sickness    Past Surgical History:  Procedure Laterality Date  . ANTERIOR CERVICAL DECOMP/DISCECTOMY FUSION  02/20/2012   Procedure: ANTERIOR CERVICAL DECOMPRESSION/DISCECTOMY FUSION 1 LEVEL;  Surgeon: Erline Levine, MD;  Location: [facility];  Service: Neurosurgery;  Laterality: N/A;  Cervical six - seven  Anterior cervical  decompression/diskectomy/fusion  . APPENDECTOMY  1984  . KNEE ARTHROSCOPY     right  . NASAL SEPTUM SURGERY  2008    Review of Systems  Constitutional: Negative.  Negative for appetite change and fatigue.  HENT: Negative.   Eyes: Negative.  Negative for pain and visual disturbance.  Respiratory: Negative.  Negative for cough, chest tightness, shortness of breath and wheezing.   Cardiovascular: Negative.  Negative for chest pain, palpitations and leg swelling.  Gastrointestinal: Negative.  Negative for abdominal pain, diarrhea, nausea and vomiting.  Endocrine: Negative.   Genitourinary: Negative.   Musculoskeletal: Negative.   Skin: Negative.  Negative for color change and rash.  Neurological: Negative.  Negative for weakness, numbness and headaches.  Psychiatric/Behavioral: Negative.     Allergies as of 02/14/2017   No Known Allergies     Medication List       Accurate as of 02/14/17 11:59 PM. Always use your most recent med list.          ALEVE 220 MG tablet Generic drug:  naproxen sodium Take 220 mg by mouth 2 (two) times daily with a meal.   Azelastine-Fluticasone 137-50 MCG/ACT Susp Use 2 sprays into each nostril once per day.   beclomethasone 80 MCG/ACT inhaler Commonly known as:  QVAR Inhale 2 puffs into the lungs 2 (two) times daily.   cyclobenzaprine 5 MG tablet Commonly known as:  FLEXERIL  Take one to two at night as needed.   empagliflozin 10 MG Tabs tablet Commonly known as:  JARDIANCE Take 10 mg by mouth daily.   glucose blood test strip True Test Strips. Check blood sugars three times per day. E11.65.   levocetirizine 5 MG tablet Commonly known as:  XYZAL Take 1 tablet (5 mg total) by mouth every evening.   metFORMIN 750 MG 24 hr tablet Commonly known as:  GLUCOPHAGE-XR Take 1 tablet (750 mg total) by mouth 2 (two) times daily.   montelukast 10 MG tablet Commonly known as:  SINGULAIR Take 1 tablet (10 mg total) by mouth at bedtime.     omeprazole 40 MG capsule Commonly known as:  PRILOSEC Take 1 capsule (40 mg total) by mouth daily as needed. For acid reflux   tamsulosin 0.4 MG Caps capsule Commonly known as:  FLOMAX Take 1 capsule (0.4 mg total) by mouth daily after supper.   traMADol 50 MG tablet Commonly known as:  ULTRAM Take 1 tablet (50 mg total) by mouth every 6 (six) hours as needed.   TRUEPLUS LANCETS 33G Misc True Lancets. Check Blood Sugars Three times  Per Day. DX: E11.65            Discharge Care Instructions        Start     Ordered   02/14/17 0000  CBC with Differential/Platelet     02/14/17 1012   02/14/17 0000  CMP14+EGFR     02/14/17 1012   02/14/17 0000  Lipid panel     02/14/17 1012   02/14/17 0000  TSH     02/14/17 1012   02/14/17 0000  Bayer DCA Hb A1c Waived     02/14/17 1012   02/14/17 0000  empagliflozin (JARDIANCE) 10 MG TABS tablet  Daily    Question:  Supervising Provider  Answer:  Timmothy Euler   02/14/17 1012   02/14/17 0000  metFORMIN (GLUCOPHAGE-XR) 750 MG 24 hr tablet  2 times daily    Question:  Supervising Provider  Answer:  Timmothy Euler   02/14/17 1012   02/14/17 0000  montelukast (SINGULAIR) 10 MG tablet  Daily at bedtime    Comments:  Generic OZD:GUYQIHKVQ '10MG'$  (TAB) Generic QVZ:DGLOVFIEP '10MG'$  (TAB)  Question:  Supervising Provider  Answer:  Timmothy Euler   02/14/17 1012   02/14/17 0000  levocetirizine (XYZAL) 5 MG tablet  Every evening    Question:  Supervising Provider  Answer:  Timmothy Euler   02/14/17 1012   02/14/17 0000  tamsulosin (FLOMAX) 0.4 MG CAPS capsule  Daily after supper    Question:  Supervising Provider  Answer:  Timmothy Euler   02/14/17 1012         Objective:    BP 127/89   Pulse 83   Temp 98.4 F (36.9 C) (Oral)   Ht '6\' 1"'$  (1.854 m)   Wt 276 lb (125.2 kg)   BMI 36.41 kg/m   No Known Allergies  Physical Exam  Constitutional: He appears well-developed and well-nourished.  HENT:  Head:  Normocephalic and atraumatic.  Eyes: Pupils are equal, round, and reactive to light. Conjunctivae and EOM are normal.  Neck: Normal range of motion. Neck supple.  Cardiovascular: Normal rate, regular rhythm and normal heart sounds.   Pulmonary/Chest: Effort normal and breath sounds normal.  Abdominal: Soft. Bowel sounds are normal.  Musculoskeletal: Normal range of motion.  Skin: Skin is warm and dry.  Nursing note and vitals reviewed.  Results for orders placed or performed in visit on 02/14/17  CBC with Differential/Platelet  Result Value Ref Range   WBC 5.4 3.4 - 10.8 x10E3/uL   RBC 5.71 4.14 - 5.80 x10E6/uL   Hemoglobin 17.1 13.0 - 17.7 g/dL   Hematocrit 49.6 37.5 - 51.0 %   MCV 87 79 - 97 fL   MCH 29.9 26.6 - 33.0 pg   MCHC 34.5 31.5 - 35.7 g/dL   RDW 13.4 12.3 - 15.4 %   Platelets 180 150 - 379 x10E3/uL   Neutrophils 62 Not Estab. %   Lymphs 26 Not Estab. %   Monocytes 6 Not Estab. %   Eos 4 Not Estab. %   Basos 2 Not Estab. %   Neutrophils Absolute 3.3 1.4 - 7.0 x10E3/uL   Lymphocytes Absolute 1.4 0.7 - 3.1 x10E3/uL   Monocytes Absolute 0.3 0.1 - 0.9 x10E3/uL   EOS (ABSOLUTE) 0.2 0.0 - 0.4 x10E3/uL   Basophils Absolute 0.1 0.0 - 0.2 x10E3/uL   Immature Granulocytes 0 Not Estab. %   Immature Grans (Abs) 0.0 0.0 - 0.1 x10E3/uL  CMP14+EGFR  Result Value Ref Range   Glucose 279 (H) 65 - 99 mg/dL   BUN 13 6 - 24 mg/dL   Creatinine, Ser 1.03 0.76 - 1.27 mg/dL   GFR calc non Af Amer 80 >59 mL/min/1.73   GFR calc Af Amer 93 >59 mL/min/1.73   BUN/Creatinine Ratio 13 9 - 20   Sodium 137 134 - 144 mmol/L   Potassium 5.0 3.5 - 5.2 mmol/L   Chloride 100 96 - 106 mmol/L   CO2 22 20 - 29 mmol/L   Calcium 9.7 8.7 - 10.2 mg/dL   Total Protein 7.4 6.0 - 8.5 g/dL   Albumin 4.5 3.5 - 5.5 g/dL   Globulin, Total 2.9 1.5 - 4.5 g/dL   Albumin/Globulin Ratio 1.6 1.2 - 2.2   Bilirubin Total 0.6 0.0 - 1.2 mg/dL   Alkaline Phosphatase 85 39 - 117 IU/L   AST 36 0 - 40 IU/L   ALT 54  (H) 0 - 44 IU/L  Lipid panel  Result Value Ref Range   Cholesterol, Total 193 100 - 199 mg/dL   Triglycerides 298 (H) 0 - 149 mg/dL   HDL 37 (L) >39 mg/dL   VLDL Cholesterol Cal 60 (H) 5 - 40 mg/dL   LDL Calculated 96 0 - 99 mg/dL   Chol/HDL Ratio 5.2 (H) 0.0 - 5.0 ratio  TSH  Result Value Ref Range   TSH 0.473 0.450 - 4.500 uIU/mL  Bayer DCA Hb A1c Waived  Result Value Ref Range   Bayer DCA Hb A1c Waived 11.2 (H) <7.0 %      Assessment & Plan:   1. Metabolic syndrome - CBC with Differential/Platelet - CMP14+EGFR - Lipid panel - TSH - Bayer DCA Hb A1c Waived  2. Uncontrolled type 2 diabetes mellitus without complication, without long-term current use of insulin (H[facility]) - CBC with Differential/Platelet - CMP14+EGFR - Lipid panel - TSH - Bayer DCA Hb A1c Waived - empagliflozin (JARDIANCE) 10 MG TABS tablet; Take 10 mg by mouth daily.  Dispense: 90 tablet; Refill: 3 - metFORMIN (GLUCOPHAGE-XR) 750 MG 24 hr tablet; Take 1 tablet (750 mg total) by mouth 2 (two) times daily.  Dispense: 180 tablet; Refill: 3  3. Mild intermittent asthma, unspecified whether complicated - beclomethasone (QVAR) 80 MCG/ACT inhaler; Inhale 2 puffs into the lungs 2 (two) times daily. - montelukast (SINGULAIR) 10 MG tablet; Take 1 tablet (  10 mg total) by mouth at bedtime.  Dispense: 90 tablet; Refill: 3  4. Allergic rhinitis due to pollen, unspecified seasonality - levocetirizine (XYZAL) 5 MG tablet; Take 1 tablet (5 mg total) by mouth every evening.  Dispense: 90 tablet; Refill: 3  5. Dyslipidemia - Lipid panel  6. Benign prostatic hyperplasia with incomplete bladder emptying - tamsulosin (FLOMAX) 0.4 MG CAPS capsule; Take 1 capsule (0.4 mg total) by mouth daily after supper.  Dispense: 90 capsule; Refill: 3    Current Outpatient Prescriptions:  .  Azelastine-Fluticasone 137-50 MCG/ACT SUSP, Use 2 sprays into each nostril once per day., Disp: 23 g, Rfl: 2 .  beclomethasone (QVAR) 80 MCG/ACT  inhaler, Inhale 2 puffs into the lungs 2 (two) times daily., Disp: , Rfl:  .  cyclobenzaprine (FLEXERIL) 5 MG tablet, Take one to two at night as needed., Disp: 30 tablet, Rfl: 1 .  empagliflozin (JARDIANCE) 10 MG TABS tablet, Take 10 mg by mouth daily., Disp: 90 tablet, Rfl: 3 .  glucose blood test strip, True Test Strips. Check blood sugars three times per day. E11.65., Disp: 300 each, Rfl: 3 .  levocetirizine (XYZAL) 5 MG tablet, Take 1 tablet (5 mg total) by mouth every evening., Disp: 90 tablet, Rfl: 3 .  metFORMIN (GLUCOPHAGE-XR) 750 MG 24 hr tablet, Take 1 tablet (750 mg total) by mouth 2 (two) times daily., Disp: 180 tablet, Rfl: 3 .  montelukast (SINGULAIR) 10 MG tablet, Take 1 tablet (10 mg total) by mouth at bedtime., Disp: 90 tablet, Rfl: 3 .  naproxen sodium (ALEVE) 220 MG tablet, Take 220 mg by mouth 2 (two) times daily with a meal., Disp: , Rfl:  .  omeprazole (PRILOSEC) 40 MG capsule, Take 1 capsule (40 mg total) by mouth daily as needed. For acid reflux, Disp: 90 capsule, Rfl: 2 .  traMADol (ULTRAM) 50 MG tablet, Take 1 tablet (50 mg total) by mouth every 6 (six) hours as needed., Disp: 60 tablet, Rfl: 1 .  TRUEPLUS LANCETS 33G MISC, True Lancets. Check Blood Sugars Three times  Per Day. DX: E11.65, Disp: 300 each, Rfl: 3 .  tamsulosin (FLOMAX) 0.4 MG CAPS capsule, Take 1 capsule (0.4 mg total) by mouth daily after supper., Disp: 90 capsule, Rfl: 3 Continue all other maintenance medications as listed above.  Follow up plan: Return in about 3 months (around 05/17/2017) for recheck.  Educational handout given for Cook PA-C [facility] [address]  [place], [place] [address] [phone]   02/16/2017, 8:10 AM

## 2017-02-20 ENCOUNTER — Telehealth: Payer: Self-pay | Admitting: Internal Medicine

## 2017-03-06 ENCOUNTER — Encounter: Payer: Self-pay | Admitting: Internal Medicine

## 2017-03-06 NOTE — Telephone Encounter (Signed)
Dr.Pyrtle reviewed records and accepted patient, stating pt is overdue for next colon. Called patient and schedule procedure.

## 2017-03-12 ENCOUNTER — Telehealth: Payer: 59 | Admitting: Family

## 2017-03-12 DIAGNOSIS — J329 Chronic sinusitis, unspecified: Secondary | ICD-10-CM

## 2017-03-12 DIAGNOSIS — B9789 Other viral agents as the cause of diseases classified elsewhere: Secondary | ICD-10-CM | POA: Diagnosis not present

## 2017-03-12 MED ORDER — FLUTICASONE PROPIONATE 50 MCG/ACT NA SUSP: 2.0000 | Freq: Every day | NASAL | 6 refills | Status: DC

## 2017-03-12 NOTE — Progress Notes (Signed)
We are sorry that you are not feeling well.  Here is how we plan to help!  Based on what you have shared with me it looks like you have sinusitis.  Sinusitis is inflammation and infection in the sinus cavities of the head.  Based on your presentation I believe you most likely have Acute Viral Sinusitis.This is an infection most likely caused by a virus. There is not specific treatment for viral sinusitis other than to help you with the symptoms until the infection runs its course.  You may use an oral decongestant such as Mucinex D or if you have glaucoma or high blood pressure use plain Mucinex. Saline nasal spray help and can safely be used as often as needed for congestion, I have prescribed: Fluticasone nasal spray two sprays in each nostril twice a day   Providers prescribe antibiotics to treat infections caused by bacteria. Antibiotics are very powerful in treating bacterial infections when they are used properly. To maintain their effectiveness, they should be used only when necessary. Overuse of antibiotics has resulted in the development of superbugs that are resistant to treatment!    After careful review of your answers, I would not recommend an antibiotic for your condition.  Antibiotics are not effective against viruses and therefore should not be used to treat them. Common examples of infections caused by viruses include colds and flu    Some authorities believe that zinc sprays or the use of Echinacea may shorten the course of your symptoms.  Sinus infections are not as easily transmitted as other respiratory infection, however we still recommend that you avoid close contact with loved ones, especially the very young and elderly.  Remember to wash your hands thoroughly throughout the day as this is the number one way to prevent the spread of infection!  Home Care:  Only take medications as instructed by your medical team.  Complete the entire course of an antibiotic.  Do not take  these medications with alcohol.  A steam or ultrasonic humidifier can help congestion.  You can place a towel over your head and breathe in the steam from hot water coming from a faucet.  Avoid close contacts especially the very young and the elderly.  Cover your mouth when you cough or sneeze.  Always remember to wash your hands.  Get Help Right Away If:  You develop worsening fever or sinus pain.  You develop a severe head ache or visual changes.  Your symptoms persist after you have completed your treatment plan.  Make sure you  Understand these instructions.  Will watch your condition.  Will get help right away if you are not doing well or get worse.  Your e-visit answers were reviewed by a board certified advanced clinical practitioner to complete your personal care plan.  Depending on the condition, your plan could have included both over the counter or prescription medications.  If there is a problem please reply  once you have received a response from your provider.  Your safety is important to [place].  If you have drug allergies check your prescription carefully.    You can use MyChart to ask questions about today's visit, request a non-urgent call back, or ask for a work or school excuse for 24 hours related to this e-Visit. If it has been greater than 24 hours you will need to follow up with your provider, or enter a new e-Visit to address those concerns.  You will get an e-mail in the next  two days asking about your experience.  I hope that your e-visit has been valuable and will speed your recovery. Thank you for using e-visits.

## 2017-03-15 ENCOUNTER — Ambulatory Visit (INDEPENDENT_AMBULATORY_CARE_PROVIDER_SITE_OTHER): Payer: 59 | Admitting: Pediatrics

## 2017-03-15 ENCOUNTER — Encounter: Payer: Self-pay | Admitting: Pediatrics

## 2017-03-15 VITALS — BP 140/91 | HR 89 | Temp 98.3°F | Ht 73.0 in | Wt 274.0 lb

## 2017-03-15 DIAGNOSIS — J189 Pneumonia, unspecified organism: Secondary | ICD-10-CM

## 2017-03-15 MED ORDER — AZITHROMYCIN 250 MG PO TABS: ORAL_TABLET | ORAL | 0 refills | Status: DC

## 2017-03-15 NOTE — Progress Notes (Signed)
  Subjective:   Patient ID: [name], male    DOB: May 26, 1961, 56 y.o.   MRN: [id number] CC: URI  HPI: [name] [name] is a 55 y.o. male presenting for URI  Sick for about 7 days Started with congestion Ears feeling full Now feels "like he has the flu" Several other colleagues at work with URI symptoms around him No sore throat Appetite has been fine Subjective chills at times, no fevers Starting to have more sharp pains with deep breaths middle upper chest No SOB Some , sometimes productive Thinks the cough has gotten slightly worse over the last couple of days Pain not associated with coughing  Feels like he is having a harder time getting air in and out with deep breaths  No h/o blood clots Taking allergy pill daily Has flonase at home, not taking last few days Does have nasal congestion, no pain in face/sinuses  Relevant past medical, surgical, family and social history reviewed. Allergies and medications reviewed and updated. History  Smoking Status  . Former Smoker  Smokeless Tobacco  . Never Used   ROS: Per HPI   Objective:    BP (!) 140/91 (BP Location: Right Arm, Cuff Size: Large)   Pulse 89   Temp 98.3 F (36.8 C) (Oral)   Ht 6\' 1"  (1.854 m)   Wt 274 lb (124.3 kg)   SpO2 90%   BMI 36.15 kg/m   Wt Readings from Last 3 Encounters:  03/15/17 274 lb (124.3 kg)  02/14/17 276 lb (125.2 kg)  05/08/16 281 lb (127.5 kg)    Gen: NAD, alert, cooperative with exam, NCAT EYES: EOMI, no conjunctival injection, or no icterus ENT:  Milky fluid R TM effusion, L TM slightly pink, OP with mild erythema LYMPH: small < 1cm b/l ant cervical LAD CV: NRRR, normal S1/S2, no murmur, distal pulses 2+ b/l Resp: CTABL, no wheezes or rales, normal WOB Abd: +BS, soft, NTND. no guarding or organomegaly Ext: No edema, warm Neuro: Alert and oriented, strength equal b/l UE and LE, coordination grossly normal MSK: normal muscle bulk  Assessment & Plan:  [name]  was seen today for uri.  Diagnoses and all orders for this visit:  Atypical pneumonia Return precautions discussed HR normal No SOB No recent surgeries/travel -     azithromycin (ZITHROMAX) 250 MG tablet; Take 2 the first day and then one each day after.   Follow up plan: Return if symptoms worsen or fail to improve. Assunta Found, MD [place]

## 2017-05-11 ENCOUNTER — Encounter: Payer: Self-pay | Admitting: Physician Assistant

## 2017-05-11 ENCOUNTER — Ambulatory Visit (INDEPENDENT_AMBULATORY_CARE_PROVIDER_SITE_OTHER): Payer: 59 | Admitting: Physician Assistant

## 2017-05-11 ENCOUNTER — Other Ambulatory Visit: Payer: Self-pay | Admitting: Physician Assistant

## 2017-05-11 VITALS — BP 138/85 | HR 83 | Temp 98.5°F | Ht 73.0 in | Wt 274.0 lb

## 2017-05-11 DIAGNOSIS — E785 Hyperlipidemia, unspecified: Principal | ICD-10-CM

## 2017-05-11 DIAGNOSIS — E1169 Type 2 diabetes mellitus with other specified complication: Secondary | ICD-10-CM

## 2017-05-11 DIAGNOSIS — S29019A Strain of muscle and tendon of unspecified wall of thorax, initial encounter: Secondary | ICD-10-CM

## 2017-05-11 DIAGNOSIS — S39012A Strain of muscle, fascia and tendon of lower back, initial encounter: Secondary | ICD-10-CM | POA: Diagnosis not present

## 2017-05-11 MED ORDER — PREDNISONE 10 MG (48) PO TBPK: ORAL_TABLET | ORAL | 0 refills | Status: DC

## 2017-05-11 NOTE — Patient Instructions (Signed)
In a few days you may receive a survey in the mail or online from [employer] regarding your visit with us today. Please take a moment to fill this out. Your feedback is very important to our whole office. It can help us better understand your needs as well as improve your experience and satisfaction. Thank you for taking your time to complete it. We care about you.  Alliah Boulanger, PA-C  

## 2017-05-14 DIAGNOSIS — M546 Pain in thoracic spine: Secondary | ICD-10-CM | POA: Diagnosis not present

## 2017-05-14 DIAGNOSIS — S338XXA Sprain of other parts of lumbar spine and pelvis, initial encounter: Secondary | ICD-10-CM | POA: Diagnosis not present
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
## 2017-05-15 DIAGNOSIS — S338XXA Sprain of other parts of lumbar spine and pelvis, initial encounter: Secondary | ICD-10-CM | POA: Diagnosis not present

## 2017-05-15 DIAGNOSIS — M546 Pain in thoracic spine: Secondary | ICD-10-CM | POA: Diagnosis not present

## 2017-05-21 DIAGNOSIS — S338XXA Sprain of other parts of lumbar spine and pelvis, initial encounter: Secondary | ICD-10-CM | POA: Diagnosis not present

## 2017-05-21 DIAGNOSIS — M546 Pain in thoracic spine: Secondary | ICD-10-CM | POA: Diagnosis not present

## 2017-05-22 ENCOUNTER — Encounter: Payer: Self-pay | Admitting: Physician Assistant

## 2017-05-22 ENCOUNTER — Ambulatory Visit (INDEPENDENT_AMBULATORY_CARE_PROVIDER_SITE_OTHER): Payer: 59 | Admitting: Physician Assistant

## 2017-05-22 VITALS — BP 122/82 | HR 79 | Temp 97.2°F | Ht 73.0 in | Wt 268.8 lb

## 2017-05-22 DIAGNOSIS — Z23 Encounter for immunization: Secondary | ICD-10-CM | POA: Diagnosis not present

## 2017-05-22 DIAGNOSIS — R3914 Feeling of incomplete bladder emptying: Secondary | ICD-10-CM

## 2017-05-22 DIAGNOSIS — Z1159 Encounter for screening for other viral diseases: Secondary | ICD-10-CM

## 2017-05-22 DIAGNOSIS — Z0000 Encounter for general adult medical examination without abnormal findings: Secondary | ICD-10-CM

## 2017-05-22 DIAGNOSIS — N401 Enlarged prostate with lower urinary tract symptoms: Secondary | ICD-10-CM | POA: Diagnosis not present

## 2017-05-22 DIAGNOSIS — Z114 Encounter for screening for human immunodeficiency virus [HIV]: Secondary | ICD-10-CM | POA: Diagnosis not present

## 2017-05-22 DIAGNOSIS — K219 Gastro-esophageal reflux disease without esophagitis: Secondary | ICD-10-CM | POA: Diagnosis not present

## 2017-05-22 DIAGNOSIS — E1165 Type 2 diabetes mellitus with hyperglycemia: Secondary | ICD-10-CM

## 2017-05-22 LAB — BAYER DCA HB A1C WAIVED: HB A1C: 7.6 % — AB (ref <7.0)

## 2017-05-22 MED ORDER — TAMSULOSIN HCL 0.4 MG PO CAPS: 0.8000 mg | ORAL_CAPSULE | Freq: Every day | ORAL | 3 refills | Status: DC

## 2017-05-22 NOTE — Patient Instructions (Signed)

## 2017-05-22 NOTE — Progress Notes (Signed)
BP 122/82   Pulse 79   Temp (!) 97.2 F (36.2 C) (Oral)   Ht 6' 1"  (1.854 m)   Wt 268 lb 12.8 oz (121.9 kg)   BMI 35.46 kg/m    Subjective:    Patient ID: [name] [name], male    DOB: July 02, 1960, 56 y.o.   MRN: [id number]  HPI: [name] [name] is a 56 y.o. male presenting on 05/22/2017 for Follow-up (3 month); Diabetes; and Hyperlipidemia  This patient is diabetes, BPH, GERD.  Overall states he is feeling better.  He has been trying to do very well with his diet.  Blood work today to check his A1c.  He is having increasing BPH symptoms.  Increasing his medications he also would like to the HIV and hepatitis C screening performed today.  Long discussion about diet and exercise for his diabetes.   Relevant past medical, surgical, family and social history reviewed and updated as indicated. Allergies and medications reviewed and updated.  Past Medical History:  Diagnosis Date  . Arthritis   . Asthma   . Blood in stool   . COPD (chronic obstructive pulmonary disease) ([place])   . Diabetes mellitus without complication ([place]) 5/53   type 2  . GERD (gastroesophageal reflux disease)   . Hay fever   . Hx of colonic polyps   . Migraines   . PONV (postoperative nausea and vomiting)    also history of motion sickness    Past Surgical History:  Procedure Laterality Date  . ANTERIOR CERVICAL DECOMP/DISCECTOMY FUSION  02/20/2012   Procedure: ANTERIOR CERVICAL DECOMPRESSION/DISCECTOMY FUSION 1 LEVEL;  Surgeon: Erline Levine, MD;  Location: [place] NEURO ORS;  Service: Neurosurgery;  Laterality: N/A;  Cervical six - seven  Anterior cervical decompression/diskectomy/fusion  . APPENDECTOMY  1984  . KNEE ARTHROSCOPY     right  . NASAL SEPTUM SURGERY  2008    Review of Systems  Constitutional: Positive for fatigue. Negative for appetite change.  HENT: Negative.   Eyes: Negative.  Negative for pain and visual disturbance.  Respiratory: Negative.  Negative for cough, chest tightness,  shortness of breath and wheezing.   Cardiovascular: Negative.  Negative for chest pain, palpitations and leg swelling.  Gastrointestinal: Negative.  Negative for abdominal pain, diarrhea, nausea and vomiting.  Endocrine: Negative.   Genitourinary: Positive for decreased urine volume and difficulty urinating. Negative for testicular pain and urgency.  Musculoskeletal: Positive for arthralgias.  Skin: Negative.  Negative for color change and rash.  Neurological: Negative.  Negative for weakness, numbness and headaches.  Psychiatric/Behavioral: Negative.     Allergies as of 05/22/2017   No Known Allergies     Medication List        Accurate as of 05/22/17 10:39 PM. Always use your most recent med list.          ALEVE 220 MG tablet Generic drug:  naproxen sodium Take 220 mg by mouth 2 (two) times daily with a meal.   Azelastine-Fluticasone 137-50 MCG/ACT Susp Use 2 sprays into each nostril once per day.   beclomethasone 80 MCG/ACT inhaler Commonly known as:  QVAR Inhale 2 puffs into the lungs 2 (two) times daily.   cyclobenzaprine 5 MG tablet Commonly known as:  FLEXERIL Take one to two at night as needed.   empagliflozin 10 MG Tabs tablet Commonly known as:  JARDIANCE Take 10 mg by mouth daily.   fluticasone 50 MCG/ACT nasal spray Commonly known as:  FLONASE Place 2 sprays into both nostrils  daily.   glucose blood test strip True Test Strips. Check blood sugars three times per day. E11.65.   levocetirizine 5 MG tablet Commonly known as:  XYZAL Take 1 tablet (5 mg total) by mouth every evening.   metFORMIN 750 MG 24 hr tablet Commonly known as:  GLUCOPHAGE-XR Take 1 tablet (750 mg total) by mouth 2 (two) times daily.   montelukast 10 MG tablet Commonly known as:  SINGULAIR Take 1 tablet (10 mg total) by mouth at bedtime.   omeprazole 40 MG capsule Commonly known as:  PRILOSEC Take 1 capsule (40 mg total) by mouth daily as needed. For acid reflux     tamsulosin 0.4 MG Caps capsule Commonly known as:  FLOMAX Take 2 capsules (0.8 mg total) by mouth daily.   traMADol 50 MG tablet Commonly known as:  ULTRAM Take 1 tablet (50 mg total) by mouth every 6 (six) hours as needed.   TRUEPLUS LANCETS 33G Misc True Lancets. Check Blood Sugars Three times  Per Day. DX: E11.65          Objective:    BP 122/82   Pulse 79   Temp (!) 97.2 F (36.2 C) (Oral)   Ht 6' 1"  (1.854 m)   Wt 268 lb 12.8 oz (121.9 kg)   BMI 35.46 kg/m   No Known Allergies  Physical Exam  Constitutional: He appears well-developed and well-nourished.  HENT:  Head: Normocephalic and atraumatic.  Eyes: Conjunctivae and EOM are normal. Pupils are equal, round, and reactive to light.  Neck: Normal range of motion. Neck supple.  Cardiovascular: Normal rate, regular rhythm and normal heart sounds.  Pulmonary/Chest: Effort normal and breath sounds normal.  Abdominal: Soft. Bowel sounds are normal.  Musculoskeletal: Normal range of motion.  Skin: Skin is warm and dry.    Diabetic Foot Exam - Simple   Simple Foot Form Diabetic Foot exam was performed with the following findings:  Yes 05/22/2017  9:42 AM  Visual Inspection No deformities, no ulcerations, no other skin breakdown bilaterally:  Yes Sensation Testing Intact to touch and monofilament testing bilaterally:  Yes Pulse Check Posterior Tibialis and Dorsalis pulse intact bilaterally:  Yes Comments         Assessment & Plan:   1. Uncontrolled type 2 diabetes mellitus with hyperglycemia (HCC) - CBC with Differential/Platelet - CMP14+EGFR - Lipid panel - Microalbumin / creatinine urine ratio - TSH - Bayer DCA Hb A1c Waived  2. Benign prostatic hyperplasia with incomplete bladder emptying - PSA - tamsulosin (FLOMAX) 0.4 MG CAPS capsule; Take 2 capsules (0.8 mg total) by mouth daily.  Dispense: 90 capsule; Refill: 3  3. Gastroesophageal reflux disease, esophagitis presence not specified  4. Well  adult exam - CBC with Differential/Platelet - Lipid panel - TSH - Hepatitis C antibody - HIV antibody  5. Screening for human immunodeficiency virus - HIV antibody  6. Encounter for hepatitis C screening test for low risk patient - Hepatitis C antibody  7. Severe obesity (BMI >= 40) (HCC)  8. Need for immunization against influenza - Flu Vaccine QUAD 36+ mos IM    Current Outpatient Medications:  .  Azelastine-Fluticasone 137-50 MCG/ACT SUSP, Use 2 sprays into each nostril once per day., Disp: 23 g, Rfl: 2 .  beclomethasone (QVAR) 80 MCG/ACT inhaler, Inhale 2 puffs into the lungs 2 (two) times daily., Disp: , Rfl:  .  cyclobenzaprine (FLEXERIL) 5 MG tablet, Take one to two at night as needed., Disp: 30 tablet, Rfl: 1 .  empagliflozin (JARDIANCE) 10 MG TABS tablet, Take 10 mg by mouth daily., Disp: 90 tablet, Rfl: 3 .  fluticasone (FLONASE) 50 MCG/ACT nasal spray, Place 2 sprays into both nostrils daily., Disp: 16 g, Rfl: 6 .  glucose blood test strip, True Test Strips. Check blood sugars three times per day. E11.65., Disp: 300 each, Rfl: 3 .  levocetirizine (XYZAL) 5 MG tablet, Take 1 tablet (5 mg total) by mouth every evening., Disp: 90 tablet, Rfl: 3 .  metFORMIN (GLUCOPHAGE-XR) 750 MG 24 hr tablet, Take 1 tablet (750 mg total) by mouth 2 (two) times daily., Disp: 180 tablet, Rfl: 3 .  montelukast (SINGULAIR) 10 MG tablet, Take 1 tablet (10 mg total) by mouth at bedtime., Disp: 90 tablet, Rfl: 3 .  naproxen sodium (ALEVE) 220 MG tablet, Take 220 mg by mouth 2 (two) times daily with a meal., Disp: , Rfl:  .  omeprazole (PRILOSEC) 40 MG capsule, Take 1 capsule (40 mg total) by mouth daily as needed. For acid reflux, Disp: 90 capsule, Rfl: 2 .  tamsulosin (FLOMAX) 0.4 MG CAPS capsule, Take 2 capsules (0.8 mg total) by mouth daily., Disp: 90 capsule, Rfl: 3 .  traMADol (ULTRAM) 50 MG tablet, Take 1 tablet (50 mg total) by mouth every 6 (six) hours as needed., Disp: 60 tablet, Rfl: 1 .   TRUEPLUS LANCETS 33G MISC, True Lancets. Check Blood Sugars Three times  Per Day. DX: E11.65, Disp: 300 each, Rfl: 3 Continue all other maintenance medications as listed above.  Follow up plan: Return in about 3 months (around 08/22/2017) for recheck and lab.  Educational handout given for carb counting  Terald Sleeper PA-C [facility] [place] [address]  [place], [place] [address] [phone]   05/22/2017, 10:39 PM

## 2017-05-23 LAB — CBC WITH DIFFERENTIAL/PLATELET
BASOS: 1 %
Basophils Absolute: 0.1 10*3/uL (ref 0.0–0.2)
EOS (ABSOLUTE): 0.1 10*3/uL (ref 0.0–0.4)
EOS: 1 %
HEMATOCRIT: 51.3 % — AB (ref 37.5–51.0)
Hemoglobin: 17.7 g/dL (ref 13.0–17.7)
Immature Grans (Abs): 0 10*3/uL (ref 0.0–0.1)
Immature Granulocytes: 1 %
LYMPHS ABS: 1.3 10*3/uL (ref 0.7–3.1)
Lymphs: 18 %
MCH: 30.5 pg (ref 26.6–33.0)
MCHC: 34.5 g/dL (ref 31.5–35.7)
MCV: 88 fL (ref 79–97)
MONOS ABS: 0.6 10*3/uL (ref 0.1–0.9)
Monocytes: 7 %
NEUTROS ABS: 5.5 10*3/uL (ref 1.4–7.0)
NEUTROS PCT: 72 %
PLATELETS: 190 10*3/uL (ref 150–379)
RBC: 5.8 x10E6/uL (ref 4.14–5.80)
RDW: 14.1 % (ref 12.3–15.4)
WBC: 7.5 10*3/uL (ref 3.4–10.8)

## 2017-05-23 LAB — CMP14+EGFR
A/G RATIO: 2.1 (ref 1.2–2.2)
ALT: 44 IU/L (ref 0–44)
AST: 16 IU/L (ref 0–40)
Albumin: 4.9 g/dL (ref 3.5–5.5)
Alkaline Phosphatase: 65 IU/L (ref 39–117)
BILIRUBIN TOTAL: 0.7 mg/dL (ref 0.0–1.2)
BUN/Creatinine Ratio: 18 (ref 9–20)
BUN: 20 mg/dL (ref 6–24)
CHLORIDE: 102 mmol/L (ref 96–106)
CO2: 20 mmol/L (ref 20–29)
Calcium: 9.7 mg/dL (ref 8.7–10.2)
Creatinine, Ser: 1.11 mg/dL (ref 0.76–1.27)
GFR calc Af Amer: 85 mL/min/{1.73_m2} (ref >59)
GFR, EST NON AFRICAN AMERICAN: 73 mL/min/{1.73_m2} (ref >59)
GLOBULIN, TOTAL: 2.3 g/dL (ref 1.5–4.5)
Glucose: 147 mg/dL — ABNORMAL HIGH (ref 65–99)
POTASSIUM: 4.8 mmol/L (ref 3.5–5.2)
SODIUM: 141 mmol/L (ref 134–144)
Total Protein: 7.2 g/dL (ref 6.0–8.5)

## 2017-05-23 LAB — LIPID PANEL
CHOL/HDL RATIO: 3.4 ratio (ref 0.0–5.0)
Cholesterol, Total: 186 mg/dL (ref 100–199)
HDL: 55 mg/dL (ref >39)
LDL Calculated: 99 mg/dL (ref 0–99)
TRIGLYCERIDES: 160 mg/dL — AB (ref 0–149)
VLDL Cholesterol Cal: 32 mg/dL (ref 5–40)

## 2017-05-23 LAB — MICROALBUMIN / CREATININE URINE RATIO
CREATININE, UR: 105.3 mg/dL
Microalb/Creat Ratio: 8.5 mg/g creat (ref 0.0–30.0)
Microalbumin, Urine: 8.9 ug/mL

## 2017-05-23 LAB — HIV ANTIBODY (ROUTINE TESTING W REFLEX): HIV SCREEN 4TH GENERATION: NONREACTIVE

## 2017-05-23 LAB — PSA: PROSTATE SPECIFIC AG, SERUM: 1.3 ng/mL (ref 0.0–4.0)

## 2017-05-23 LAB — TSH: TSH: 0.793 u[IU]/mL (ref 0.450–4.500)

## 2017-05-23 LAB — HEPATITIS C ANTIBODY: HEP C VIRUS AB: 0.1 {s_co_ratio} (ref 0.0–0.9)

## 2017-05-24 ENCOUNTER — Encounter: Payer: 59 | Admitting: Internal Medicine

## 2017-05-24 DIAGNOSIS — M546 Pain in thoracic spine: Secondary | ICD-10-CM | POA: Diagnosis not present

## 2017-05-24 DIAGNOSIS — S338XXA Sprain of other parts of lumbar spine and pelvis, initial encounter: Secondary | ICD-10-CM | POA: Diagnosis not present

## 2017-06-01 ENCOUNTER — Encounter: Payer: Self-pay | Admitting: Physician Assistant

## 2017-06-07 NOTE — Telephone Encounter (Signed)
Patient cancelled appointments and has not rescheduled as of yet. Records will be in "records reviewed" folder.

## 2017-07-11 ENCOUNTER — Ambulatory Visit (INDEPENDENT_AMBULATORY_CARE_PROVIDER_SITE_OTHER): Payer: 59 | Admitting: Physician Assistant

## 2017-07-11 ENCOUNTER — Encounter: Payer: Self-pay | Admitting: Physician Assistant

## 2017-07-11 VITALS — BP 131/89 | HR 84 | Temp 96.8°F | Ht 73.0 in | Wt 277.0 lb

## 2017-07-11 DIAGNOSIS — M545 Low back pain: Secondary | ICD-10-CM | POA: Diagnosis not present

## 2017-07-11 DIAGNOSIS — G8929 Other chronic pain: Secondary | ICD-10-CM

## 2017-07-11 DIAGNOSIS — J40 Bronchitis, not specified as acute or chronic: Secondary | ICD-10-CM | POA: Diagnosis not present

## 2017-07-11 MED ORDER — ALBUTEROL SULFATE HFA 108 (90 BASE) MCG/ACT IN AERS: 2.0000 | INHALATION_SPRAY | Freq: Four times a day (QID) | RESPIRATORY_TRACT | 2 refills | Status: DC | PRN

## 2017-07-11 MED ORDER — HYDROCODONE-HOMATROPINE 5-1.5 MG/5ML PO SYRP: 5.0000 mL | ORAL_SOLUTION | Freq: Four times a day (QID) | ORAL | 0 refills | Status: DC | PRN

## 2017-07-11 MED ORDER — DOXYCYCLINE HYCLATE 100 MG PO TABS: 100.0000 mg | ORAL_TABLET | Freq: Two times a day (BID) | ORAL | 0 refills | Status: DC

## 2017-07-11 MED ORDER — PREDNISONE 10 MG (21) PO TBPK: ORAL_TABLET | ORAL | 0 refills | Status: DC

## 2017-07-11 NOTE — Patient Instructions (Signed)
In a few days you may receive a survey in the mail or online from [employer] regarding your visit with us today. Please take a moment to fill this out. Your feedback is very important to our whole office. It can help us better understand your needs as well as improve your experience and satisfaction. Thank you for taking your time to complete it. We care about you.  Lucee Brissett, PA-C  

## 2017-07-13 DIAGNOSIS — G8929 Other chronic pain: Secondary | ICD-10-CM | POA: Insufficient documentation

## 2017-07-13 DIAGNOSIS — M545 Low back pain, unspecified: Secondary | ICD-10-CM | POA: Insufficient documentation
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
## 2017-07-16 ENCOUNTER — Ambulatory Visit (INDEPENDENT_AMBULATORY_CARE_PROVIDER_SITE_OTHER): Payer: 59

## 2017-07-16 ENCOUNTER — Encounter: Payer: Self-pay | Admitting: Family Medicine

## 2017-07-16 ENCOUNTER — Ambulatory Visit: Payer: 59 | Admitting: Family Medicine

## 2017-07-16 VITALS — BP 128/79 | HR 88 | Temp 97.5°F | Ht 73.0 in | Wt 272.0 lb

## 2017-07-16 DIAGNOSIS — R0602 Shortness of breath: Secondary | ICD-10-CM | POA: Diagnosis not present

## 2017-07-16 DIAGNOSIS — R05 Cough: Secondary | ICD-10-CM

## 2017-07-16 DIAGNOSIS — R059 Cough, unspecified: Secondary | ICD-10-CM

## 2017-07-16 NOTE — Progress Notes (Signed)
Subjective: CC: breathing concern PCP: Terald Sleeper, PA-C [id number] [name] [name] is a 57 y.o. male presenting to clinic today for:  1. Cough/ SOB Patient was seen on 07/11/2017 for URI symptoms.  He was discharged with Hycodan, doxycycline and prednisone taper.  Today he returns and notes that he continues to have a cough and sensation of shortness of breath but notes that he is able to take deep breaths in.  He has been using the albuterol inhaler with little improvement in symptoms.  He has been compliant with both oral medications that were prescribed.  He notes that he has not really noticed any difference in his symptoms since initiation of medications except for that he is physically feeling better but breathing is about the same.  He is not been using Mucinex because he was unsure of whether or not he could use this while on the other medications.  No fevers, chills.  No hemoptysis.  Denies orthopnea, lower extremity edema, heart palpitations, shortness of breath at rest.  PMH significant for COPD.   ROS: Per HPI  No Known Allergies Past Medical History:  Diagnosis Date  . Arthritis   . Asthma   . Blood in stool   . COPD (chronic obstructive pulmonary disease) ([place])   . Diabetes mellitus without complication ([place]) 3/71   type 2  . GERD (gastroesophageal reflux disease)   . Hay fever   . Hx of colonic polyps   . Migraines   . PONV (postoperative nausea and vomiting)    also history of motion sickness    Current Outpatient Medications:  .  albuterol (PROVENTIL HFA;VENTOLIN HFA) 108 (90 Base) MCG/ACT inhaler, Inhale 2 puffs into the lungs every 6 (six) hours as needed for wheezing or shortness of breath., Disp: 1 Inhaler, Rfl: 2 .  Azelastine-Fluticasone 137-50 MCG/ACT SUSP, Use 2 sprays into each nostril once per day., Disp: 23 g, Rfl: 2 .  beclomethasone (QVAR) 80 MCG/ACT inhaler, Inhale 2 puffs into the lungs 2 (two) times daily., Disp: , Rfl:  .  cyclobenzaprine  (FLEXERIL) 5 MG tablet, Take one to two at night as needed., Disp: 30 tablet, Rfl: 1 .  doxycycline (VIBRA-TABS) 100 MG tablet, Take 1 tablet (100 mg total) by mouth 2 (two) times daily., Disp: 20 tablet, Rfl: 0 .  empagliflozin (JARDIANCE) 10 MG TABS tablet, Take 10 mg by mouth daily., Disp: 90 tablet, Rfl: 3 .  fluticasone (FLONASE) 50 MCG/ACT nasal spray, Place 2 sprays into both nostrils daily., Disp: 16 g, Rfl: 6 .  glucose blood test strip, True Test Strips. Check blood sugars three times per day. E11.65., Disp: 300 each, Rfl: 3 .  HYDROcodone-homatropine (HYCODAN) 5-1.5 MG/5ML syrup, Take 5-10 mLs by mouth every 6 (six) hours as needed., Disp: 240 mL, Rfl: 0 .  levocetirizine (XYZAL) 5 MG tablet, Take 1 tablet (5 mg total) by mouth every evening., Disp: 90 tablet, Rfl: 3 .  metFORMIN (GLUCOPHAGE-XR) 750 MG 24 hr tablet, Take 1 tablet (750 mg total) by mouth 2 (two) times daily., Disp: 180 tablet, Rfl: 3 .  montelukast (SINGULAIR) 10 MG tablet, Take 1 tablet (10 mg total) by mouth at bedtime., Disp: 90 tablet, Rfl: 3 .  naproxen sodium (ALEVE) 220 MG tablet, Take 220 mg by mouth 2 (two) times daily with a meal., Disp: , Rfl:  .  omeprazole (PRILOSEC) 40 MG capsule, Take 1 capsule (40 mg total) by mouth daily as needed. For acid reflux, Disp: 90 capsule, Rfl: 2 .  predniSONE (STERAPRED UNI-PAK 21 TAB) 10 MG (21) TBPK tablet, Take as directed 6 days, Disp: 21 tablet, Rfl: 0 .  tamsulosin (FLOMAX) 0.4 MG CAPS capsule, Take 2 capsules (0.8 mg total) by mouth daily., Disp: 90 capsule, Rfl: 3 .  traMADol (ULTRAM) 50 MG tablet, Take 1 tablet (50 mg total) by mouth every 6 (six) hours as needed., Disp: 60 tablet, Rfl: 1 .  TRUEPLUS LANCETS 33G MISC, True Lancets. Check Blood Sugars Three times  Per Day. DX: E11.65, Disp: 300 each, Rfl: 3 Social History   Socioeconomic History  . Marital status: Married    Spouse name: Not on file  . Number of children: Not on file  . Years of education: Not on  file  . Highest education level: Not on file  Social Needs  . Financial resource strain: Not on file  . Food insecurity - worry: Not on file  . Food insecurity - inability: Not on file  . Transportation needs - medical: Not on file  . Transportation needs - non-medical: Not on file  Occupational History  . Not on file  Tobacco Use  . Smoking status: Former Research scientist (life sciences)  . Smokeless tobacco: Never Used  Substance and Sexual Activity  . Alcohol use: Yes    Comment: occasional  . Drug use: No  . Sexual activity: Not on file  Other Topics Concern  . Not on file  Social History Narrative  . Not on file   Family History  Problem Relation Age of Onset  . Cancer Maternal Uncle        colon  . Cancer Maternal Uncle   . Cancer Maternal Uncle   . Cancer Maternal Uncle   . Cancer Maternal Uncle   . Cancer Maternal Uncle   . Cancer Mother        colon, breast   . Diabetes Mother   . Diabetes Father        type II  . Cancer Maternal Grandmother        breast, colon   . Diabetes Brother   . Diabetes Paternal Grandfather     Objective: Office vital signs reviewed. BP 128/79   Pulse 88   Temp (!) 97.5 F (36.4 C) (Oral)   Ht 6\' 1"  (1.854 m)   Wt 272 lb (123.4 kg)   SpO2 96%   BMI 35.89 kg/m   Physical Examination:  General: Awake, alert, obese, No acute distress HEENT: Normal    Neck: No masses palpated. No lymphadenopathy; no JVD    Eyes: PERRLA, extraocular membranes intact, sclera white    Nose: nasal turbinates moist, no nasal discharge    Throat: moist mucus membranes, upper airway sounds appreciated Cardio: regular rate and rhythm, S1S2 heard, no murmurs appreciated Pulm: clear to auscultation bilaterally in the lower airways, wheezes appreciated in the upper airways with transmission appreciated upon auscultation to the throat.  No rhonchi or rales; normal work of breathing on room air Ext: No edema  Dg Chest 2 View  Result Date: 07/16/2017 CLINICAL DATA:  Cough.  EXAM: CHEST  2 VIEW COMPARISON:  02/16/2012 FINDINGS: Lateral film under penetrated. Frontal projection shows no focal airspace consolidation, pulmonary edema, or pleural effusion. The cardiopericardial silhouette is within normal limits for size. The visualized bony structures of the thorax are intact. IMPRESSION: No active cardiopulmonary disease. Electronically Signed   By: Misty Stanley M.D.   On: 07/16/2017 14:50   Assessment/ Plan: 57 y.o. male   1. Cough Patient is  well-appearing, afebrile with normal vital signs.  He has normal respiratory rate, effort and pulse ox on room air.  I suspect that he is having bronchospasm related to URI.  Nothing to suggest PE, ACS, CHF.  Chest x-ray was obtained to rule out atypical pneumonia.  This was negative.  I did recommend that he finish the medications that were prescribed by the previous provider.  Add Mucinex for chest decongestion.  Care instructions were reviewed with the patient.  Reasons for emergent evaluation in the emergency department were reviewed.  He voiced good understanding.  If no improvement, I did recommend that he follow-up with PCP, consider CT chest to rule out atypical pathologies. - DG Chest 2 View; Future  2. Shortness of breath   Orders Placed This Encounter  Procedures  . DG Chest 2 View    Standing Status:   Future    Number of Occurrences:   1    Standing Expiration Date:   09/14/2018    Order Specific Question:   Reason for Exam (SYMPTOM  OR DIAGNOSIS REQUIRED)    Answer:   cough    Order Specific Question:   Preferred imaging location?    Answer:   Internal    Order Specific Question:   Radiology Contrast Protocol - do NOT remove file path    Answer:   [email or web address]    Janora Norlander, [place] [phone]

## 2017-07-16 NOTE — Patient Instructions (Signed)
Your chest x-ray was negative for any evidence of pneumonia or acute abnormalities.  I think that you are having bronchospasm and irritation related to your upper respiratory infection.  The wheezes that have been heard appear to be coming from the upper airway rather than the lower lung fields.  I recommend that you continue the medications that is been prescribed and add Mucinex to decongest your chest.  If symptoms are not improving, or worsen, the next step would be consideration for CT scan of your chest.  Most cold symptoms resolve after about 2 weeks.  However, if you feel that symptoms are worsening, you are not able to catch your breath, you cough up blood, please seek immediate medical attention in the emergency department.   Bronchospasm, Adult Bronchospasm is when airways in the lungs get smaller. When this happens, it can be hard to breathe. You may cough. You may also make a whistling sound when you breathe (wheeze). Follow these instructions at home: Medicines  Take over-the-counter and prescription medicines only as told by your doctor.  If you need to use an inhaler or nebulizer to take your medicine, ask your doctor how to use it.  If you were given a spacer, always use it with your inhaler. Lifestyle  Change your heating and air conditioning filter. Do this at least once a month.  Try not to use fireplaces and wood stoves.  Do not  smoke. Do not  allow smoking in your home.  Try not to use things that have a strong smell, like perfume.  Get rid of pests (such as roaches and mice) and their poop.  Remove any mold from your home.  Keep your house clean. Get rid of dust.  Use cleaning products that have no smell.  Replace carpet with wood, tile, or vinyl flooring.  Use allergy-proof pillows, mattress covers, and box spring covers.  Wash bed sheets and blankets every week. Use hot water. Dry them in a dryer.  Use blankets that are made of polyester or  cotton.  Wash your hands often.  Keep pets out of your bedroom.  When you exercise, try not to breathe in cold air. General instructions  Have a plan for getting medical care. Know these things: ? When to call your doctor. ? When to call local emergency services (911 in the [place] ? Where to go in an emergency.  Stay up to date on your shots (immunizations).  When you have an episode: ? Stay calm. ? Relax. ? Breathe slowly. Contact a doctor if:  Your muscles ache.  Your chest hurts.  The color of the mucus you cough up (sputum) changes from clear or white to yellow, green, gray, or bloody.  The mucus you cough up gets thicker.  You have a fever. Get help right away if:  The whistling sound gets worse, even after you take your medicines.  Your coughing gets worse.  You find it even harder to breathe.  Your chest hurts very much. Summary  Bronchospasm is when airways in the lungs get smaller.  When this happens, it can be hard to breathe. You may cough. You may also make a whistling sound when you breathe.  Stay away from things that cause you to have episodes. These include smoke or dust. This information is not intended to replace advice given to you by your health care provider. Make sure you discuss any questions you have with your health care provider. Document Released: 04/09/2009 Document Revised: 06/15/2016  Document Reviewed: 06/15/2016 Elsevier Interactive Patient Education  2017 [employer].

## 2017-07-18 ENCOUNTER — Ambulatory Visit (HOSPITAL_COMMUNITY)
Admission: RE | Admit: 2017-07-18 | Discharge: 2017-07-18 | Disposition: A | Discharge status: Home / Self Care | Payer: 59 | Source: Ambulatory Visit | Attending: Physician Assistant | Admitting: Physician Assistant

## 2017-07-18 ENCOUNTER — Encounter (HOSPITAL_COMMUNITY): Payer: Self-pay

## 2017-07-18 DIAGNOSIS — M545 Low back pain: Principal | ICD-10-CM

## 2017-07-18 DIAGNOSIS — G8929 Other chronic pain: Secondary | ICD-10-CM

## 2017-08-21 ENCOUNTER — Other Ambulatory Visit: Payer: Self-pay | Admitting: Physician Assistant

## 2017-08-21 ENCOUNTER — Ambulatory Visit (HOSPITAL_COMMUNITY)
Admission: RE | Admit: 2017-08-21 | Discharge: 2017-08-21 | Disposition: A | Discharge status: Home / Self Care | Payer: 59 | Source: Ambulatory Visit | Attending: Physician Assistant | Admitting: Physician Assistant

## 2017-08-21 DIAGNOSIS — M5136 Other intervertebral disc degeneration, lumbar region: Secondary | ICD-10-CM | POA: Diagnosis not present

## 2017-08-21 DIAGNOSIS — M545 Low back pain: Secondary | ICD-10-CM | POA: Diagnosis not present

## 2017-08-21 DIAGNOSIS — M4306 Spondylolysis, lumbar region: Secondary | ICD-10-CM | POA: Insufficient documentation

## 2017-08-21 DIAGNOSIS — G8929 Other chronic pain: Secondary | ICD-10-CM | POA: Insufficient documentation

## 2017-08-21 DIAGNOSIS — M48061 Spinal stenosis, lumbar region without neurogenic claudication: Secondary | ICD-10-CM | POA: Diagnosis not present

## 2017-08-21 DIAGNOSIS — M2578 Osteophyte, vertebrae: Secondary | ICD-10-CM | POA: Insufficient documentation

## 2017-08-21 DIAGNOSIS — M5126 Other intervertebral disc displacement, lumbar region: Secondary | ICD-10-CM | POA: Insufficient documentation

## 2017-08-27 ENCOUNTER — Ambulatory Visit: Payer: 59 | Admitting: Physician Assistant

## 2017-08-27 ENCOUNTER — Encounter: Payer: Self-pay | Admitting: Physician Assistant

## 2017-08-27 VITALS — BP 126/87 | HR 81 | Temp 97.1°F | Ht 73.0 in | Wt 275.2 lb

## 2017-08-27 DIAGNOSIS — Z6836 Body mass index (BMI) 36.0-36.9, adult: Secondary | ICD-10-CM | POA: Diagnosis not present

## 2017-08-27 DIAGNOSIS — E1165 Type 2 diabetes mellitus with hyperglycemia: Secondary | ICD-10-CM | POA: Diagnosis not present

## 2017-08-27 DIAGNOSIS — J301 Allergic rhinitis due to pollen: Secondary | ICD-10-CM

## 2017-08-27 DIAGNOSIS — E119 Type 2 diabetes mellitus without complications: Secondary | ICD-10-CM | POA: Diagnosis not present

## 2017-08-27 DIAGNOSIS — E669 Obesity, unspecified: Secondary | ICD-10-CM | POA: Insufficient documentation

## 2017-08-27 NOTE — Patient Instructions (Signed)
In a few days you may receive a survey in the mail or online from [employer] regarding your visit with us today. Please take a moment to fill this out. Your feedback is very important to our whole office. It can help us better understand your needs as well as improve your experience and satisfaction. Thank you for taking your time to complete it. We care about you.  Dnya Hickle, PA-C  

## 2017-08-27 NOTE — Progress Notes (Signed)
BP 126/87   Pulse 81   Temp (!) 97.1 F (36.2 C) (Oral)   Ht _0  (1.854 m)   Wt 275 lb 3.2 oz (124.8 kg)   BMI 36.31 kg/m    Subjective:    Patient ID: [name] [name], male    DOB: Nov 16, 1960, 57 y.o.   MRN: [id number]  HPI: [name] [name] is a 57 y.o. male presenting on 08/27/2017 for Follow-up (3 month )  This patient comes in for periodic recheck on medications and conditions including DM, DDD, allergic rhinitis. No problems at this time.  He has back pain midline that is aggravating at times.  Has hard time taking any pain med because he gets too loopy, for days.  There was an acute finding of L4-5 disc protrusion On his MRI.  All medications are reviewed today. There are no reports of any problems with the medications. All of the medical conditions are reviewed and updated.  Lab work is reviewed and will be ordered as medically necessary. There are no new problems reported with today's visit.   Past Medical History:  Diagnosis Date  . Arthritis   . Asthma   . Blood in stool   . COPD (chronic obstructive pulmonary disease) ([place])   . Diabetes mellitus without complication ([place]) 2/83   type 2  . GERD (gastroesophageal reflux disease)   . Hay fever   . Hx of colonic polyps   . Migraines   . PONV (postoperative nausea and vomiting)    also history of motion sickness   Relevant past medical, surgical, family and social history reviewed and updated as indicated. Interim medical history since our last visit reviewed. Allergies and medications reviewed and updated. DATA REVIEWED: CHART IN EPIC  Family History reviewed for pertinent findings.  Review of Systems  Constitutional: Negative.  Negative for appetite change and fatigue.  HENT: Negative.   Eyes: Negative.  Negative for pain and visual disturbance.  Respiratory: Negative.  Negative for cough, chest tightness, shortness of breath and wheezing.   Cardiovascular: Negative.  Negative for chest pain,  palpitations and leg swelling.  Gastrointestinal: Negative.  Negative for abdominal pain, diarrhea, nausea and vomiting.  Endocrine: Negative.   Genitourinary: Negative.   Musculoskeletal: Positive for back pain and myalgias.  Skin: Negative.  Negative for color change and rash.  Neurological: Negative.  Negative for weakness, numbness and headaches.  Psychiatric/Behavioral: Negative.     Allergies as of 08/27/2017   No Known Allergies     Medication List        Accurate as of 08/27/17  8:26 AM. Always use your most recent med list.          albuterol 108 (90 Base) MCG/ACT inhaler Commonly known as:  PROVENTIL HFA;VENTOLIN HFA Inhale 2 puffs into the lungs every 6 (six) hours as needed for wheezing or shortness of breath.   ALEVE 220 MG tablet Generic drug:  naproxen sodium Take 220 mg by mouth 2 (two) times daily with a meal.   Azelastine-Fluticasone 137-50 MCG/ACT Susp Use 2 sprays into each nostril once per day.   beclomethasone 80 MCG/ACT inhaler Commonly known as:  QVAR Inhale 2 puffs into the lungs 2 (two) times daily.   cyclobenzaprine 5 MG tablet Commonly known as:  FLEXERIL Take one to two at night as needed.   empagliflozin 10 MG Tabs tablet Commonly known as:  JARDIANCE Take 10 mg by mouth daily.   fluticasone 50 MCG/ACT nasal spray Commonly known  as:  FLONASE Place 2 sprays into both nostrils daily.   glucose blood test strip True Test Strips. Check blood sugars three times per day. E11.65.   levocetirizine 5 MG tablet Commonly known as:  XYZAL Take 1 tablet (5 mg total) by mouth every evening.   metFORMIN 750 MG 24 hr tablet Commonly known as:  GLUCOPHAGE-XR Take 1 tablet (750 mg total) by mouth 2 (two) times daily.   montelukast 10 MG tablet Commonly known as:  SINGULAIR Take 1 tablet (10 mg total) by mouth at bedtime.   omeprazole 40 MG capsule Commonly known as:  PRILOSEC Take 1 capsule (40 mg total) by mouth daily as needed. For acid  reflux   tamsulosin 0.4 MG Caps capsule Commonly known as:  FLOMAX Take 2 capsules (0.8 mg total) by mouth daily.   traMADol 50 MG tablet Commonly known as:  ULTRAM Take 1 tablet (50 mg total) by mouth every 6 (six) hours as needed.   TRUEPLUS LANCETS 33G Misc True Lancets. Check Blood Sugars Three times  Per Day. DX: E11.65          Objective:    BP 126/87   Pulse 81   Temp (!) 97.1 F (36.2 C) (Oral)   Ht _0  (1.854 m)   Wt 275 lb 3.2 oz (124.8 kg)   BMI 36.31 kg/m   No Known Allergies  Wt Readings from Last 3 Encounters:  08/27/17 275 lb 3.2 oz (124.8 kg)  07/16/17 272 lb (123.4 kg)  07/11/17 277 lb (125.6 kg)    Physical Exam  Constitutional: He appears well-developed and well-nourished. No distress.  HENT:  Head: Normocephalic and atraumatic.  Eyes: Conjunctivae and EOM are normal. Pupils are equal, round, and reactive to light.  Cardiovascular: Normal rate, regular rhythm and normal heart sounds.  Pulmonary/Chest: Effort normal and breath sounds normal. No respiratory distress.  Skin: Skin is warm and dry.  Psychiatric: He has a normal mood and affect. His behavior is normal.  Nursing note and vitals reviewed.   Results for orders placed or performed in visit on 05/22/17  CBC with Differential/Platelet  Result Value Ref Range   WBC 7.5 3.4 - 10.8 x10E3/uL   RBC 5.80 4.14 - 5.80 x10E6/uL   Hemoglobin 17.7 13.0 - 17.7 g/dL   Hematocrit 51.3 (H) 37.5 - 51.0 %   MCV 88 79 - 97 fL   MCH 30.5 26.6 - 33.0 pg   MCHC 34.5 31.5 - 35.7 g/dL   RDW 14.1 12.3 - 15.4 %   Platelets 190 150 - 379 x10E3/uL   Neutrophils 72 Not Estab. %   Lymphs 18 Not Estab. %   Monocytes 7 Not Estab. %   Eos 1 Not Estab. %   Basos 1 Not Estab. %   Neutrophils Absolute 5.5 1.4 - 7.0 x10E3/uL   Lymphocytes Absolute 1.3 0.7 - 3.1 x10E3/uL   Monocytes Absolute 0.6 0.1 - 0.9 x10E3/uL   EOS (ABSOLUTE) 0.1 0.0 - 0.4 x10E3/uL   Basophils Absolute 0.1 0.0 - 0.2 x10E3/uL   Immature  Granulocytes 1 Not Estab. %   Immature Grans (Abs) 0.0 0.0 - 0.1 x10E3/uL  CMP14+EGFR  Result Value Ref Range   Glucose 147 (H) 65 - 99 mg/dL   BUN 20 6 - 24 mg/dL   Creatinine, Ser 1.11 0.76 - 1.27 mg/dL   GFR calc non Af Amer 73 >59 mL/min/1.73   GFR calc Af Amer 85 >59 mL/min/1.73   BUN/Creatinine Ratio 18 9 -  20   Sodium 141 134 - 144 mmol/L   Potassium 4.8 3.5 - 5.2 mmol/L   Chloride 102 96 - 106 mmol/L   CO2 20 20 - 29 mmol/L   Calcium 9.7 8.7 - 10.2 mg/dL   Total Protein 7.2 6.0 - 8.5 g/dL   Albumin 4.9 3.5 - 5.5 g/dL   Globulin, Total 2.3 1.5 - 4.5 g/dL   Albumin/Globulin Ratio 2.1 1.2 - 2.2   Bilirubin Total 0.7 0.0 - 1.2 mg/dL   Alkaline Phosphatase 65 39 - 117 IU/L   AST 16 0 - 40 IU/L   ALT 44 0 - 44 IU/L  Lipid panel  Result Value Ref Range   Cholesterol, Total 186 100 - 199 mg/dL   Triglycerides 160 (H) 0 - 149 mg/dL   HDL 55 >39 mg/dL   VLDL Cholesterol Cal 32 5 - 40 mg/dL   LDL Calculated 99 0 - 99 mg/dL   Chol/HDL Ratio 3.4 0.0 - 5.0 ratio  Microalbumin / creatinine urine ratio  Result Value Ref Range   Creatinine, Urine 105.3 Not Estab. mg/dL   Microalbumin, Urine 8.9 Not Estab. ug/mL   Microalb/Creat Ratio 8.5 0.0 - 30.0 mg/g creat  TSH  Result Value Ref Range   TSH 0.793 0.450 - 4.500 uIU/mL  Bayer DCA Hb A1c Waived  Result Value Ref Range   Bayer DCA Hb A1c Waived 7.6 (H) <7.0 %  PSA  Result Value Ref Range   Prostate Specific Ag, Serum 1.3 0.0 - 4.0 ng/mL  Hepatitis C antibody  Result Value Ref Range   Hep C Virus Ab 0.1 0.0 - 0.9 s/co ratio  HIV antibody  Result Value Ref Range   HIV Screen 4th Generation wRfx Non Reactive Non Reactive      Assessment & Plan:   1. Uncontrolled type 2 diabetes mellitus with hyperglycemia (HCC)  - CBC with Differential/Platelet - CMP14+EGFR  2. Allergic rhinitis due to pollen, unspecified seasonality   Continue all other maintenance medications as listed above.  Follow up plan: Return in about 6  months (around 02/27/2018) for recheck.  Educational handout given for [place] PA-C [facility] [address]  [place], [place] [address] [phone]   08/27/2017, 8:26 AM

## 2017-08-28 LAB — CMP14+EGFR
A/G RATIO: 2.1 (ref 1.2–2.2)
ALBUMIN: 4.7 g/dL (ref 3.5–5.5)
ALK PHOS: 61 IU/L (ref 39–117)
ALT: 47 IU/L — ABNORMAL HIGH (ref 0–44)
AST: 21 IU/L (ref 0–40)
BILIRUBIN TOTAL: 0.5 mg/dL (ref 0.0–1.2)
BUN / CREAT RATIO: 11 (ref 9–20)
BUN: 11 mg/dL (ref 6–24)
CO2: 19 mmol/L — AB (ref 20–29)
CREATININE: 0.98 mg/dL (ref 0.76–1.27)
Calcium: 9.5 mg/dL (ref 8.7–10.2)
Chloride: 103 mmol/L (ref 96–106)
GFR calc Af Amer: 99 mL/min/{1.73_m2} (ref >59)
GFR calc non Af Amer: 85 mL/min/{1.73_m2} (ref >59)
GLOBULIN, TOTAL: 2.2 g/dL (ref 1.5–4.5)
Glucose: 161 mg/dL — ABNORMAL HIGH (ref 65–99)
POTASSIUM: 5 mmol/L (ref 3.5–5.2)
SODIUM: 140 mmol/L (ref 134–144)
Total Protein: 6.9 g/dL (ref 6.0–8.5)

## 2017-08-28 LAB — CBC WITH DIFFERENTIAL/PLATELET
BASOS: 2 %
Basophils Absolute: 0.1 10*3/uL (ref 0.0–0.2)
EOS (ABSOLUTE): 0.2 10*3/uL (ref 0.0–0.4)
EOS: 4 %
HEMATOCRIT: 50 % (ref 37.5–51.0)
HEMOGLOBIN: 16.5 g/dL (ref 13.0–17.7)
Immature Grans (Abs): 0 10*3/uL (ref 0.0–0.1)
Immature Granulocytes: 0 %
LYMPHS ABS: 1.6 10*3/uL (ref 0.7–3.1)
Lymphs: 28 %
MCH: 30.1 pg (ref 26.6–33.0)
MCHC: 33 g/dL (ref 31.5–35.7)
MCV: 91 fL (ref 79–97)
MONOCYTES: 6 %
MONOS ABS: 0.3 10*3/uL (ref 0.1–0.9)
NEUTROS ABS: 3.3 10*3/uL (ref 1.4–7.0)
Neutrophils: 60 %
Platelets: 185 10*3/uL (ref 150–379)
RBC: 5.48 x10E6/uL (ref 4.14–5.80)
RDW: 14.3 % (ref 12.3–15.4)
WBC: 5.5 10*3/uL (ref 3.4–10.8)

## 2017-08-29 ENCOUNTER — Other Ambulatory Visit: Payer: Self-pay | Admitting: Physician Assistant

## 2017-08-29 ENCOUNTER — Other Ambulatory Visit: Payer: Self-pay | Admitting: Family Medicine

## 2017-08-29 DIAGNOSIS — R3914 Feeling of incomplete bladder emptying: Principal | ICD-10-CM

## 2017-08-29 DIAGNOSIS — N401 Enlarged prostate with lower urinary tract symptoms: Secondary | ICD-10-CM

## 2017-08-29 MED ORDER — OMEPRAZOLE 40 MG PO CPDR: 40.0000 mg | DELAYED_RELEASE_CAPSULE | Freq: Every day | ORAL | 1 refills | Status: DC | PRN

## 2017-09-02 ENCOUNTER — Encounter: Payer: Self-pay | Admitting: Physician Assistant

## 2017-09-03 ENCOUNTER — Other Ambulatory Visit: Payer: Self-pay | Admitting: *Deleted

## 2017-09-03 DIAGNOSIS — E1165 Type 2 diabetes mellitus with hyperglycemia: Secondary | ICD-10-CM

## 2017-09-04 LAB — SPECIMEN STATUS REPORT

## 2017-09-04 LAB — HGB A1C W/O EAG: Hgb A1c MFr Bld: 8.9 % — ABNORMAL HIGH (ref 4.8–5.6)

## 2017-09-06 ENCOUNTER — Encounter: Payer: Self-pay | Admitting: *Deleted

## 2017-09-07 ENCOUNTER — Other Ambulatory Visit: Payer: Self-pay | Admitting: *Deleted

## 2017-09-07 DIAGNOSIS — M5126 Other intervertebral disc displacement, lumbar region: Secondary | ICD-10-CM

## 2017-11-12 ENCOUNTER — Encounter: Payer: Self-pay | Admitting: *Deleted

## 2017-11-30 ENCOUNTER — Encounter: Payer: Self-pay | Admitting: Physician Assistant

## 2017-11-30 DIAGNOSIS — R3914 Feeling of incomplete bladder emptying: Principal | ICD-10-CM

## 2017-11-30 DIAGNOSIS — N401 Enlarged prostate with lower urinary tract symptoms: Secondary | ICD-10-CM

## 2017-11-30 MED ORDER — TAMSULOSIN HCL 0.4 MG PO CAPS: 0.8000 mg | ORAL_CAPSULE | Freq: Every day | ORAL | 0 refills | Status: DC

## 2018-02-27 ENCOUNTER — Ambulatory Visit: Payer: 59 | Admitting: Physician Assistant

## 2018-03-01 ENCOUNTER — Ambulatory Visit: Payer: 59 | Admitting: Physician Assistant

## 2018-03-04 ENCOUNTER — Ambulatory Visit: Payer: 59 | Admitting: Physician Assistant

## 2018-03-04 ENCOUNTER — Encounter: Payer: Self-pay | Admitting: Physician Assistant

## 2018-03-04 VITALS — BP 125/84 | HR 74 | Ht 73.0 in | Wt 274.6 lb

## 2018-03-04 DIAGNOSIS — E1165 Type 2 diabetes mellitus with hyperglycemia: Secondary | ICD-10-CM | POA: Diagnosis not present

## 2018-03-04 DIAGNOSIS — R3914 Feeling of incomplete bladder emptying: Secondary | ICD-10-CM

## 2018-03-04 DIAGNOSIS — J301 Allergic rhinitis due to pollen: Secondary | ICD-10-CM | POA: Diagnosis not present

## 2018-03-04 DIAGNOSIS — N401 Enlarged prostate with lower urinary tract symptoms: Secondary | ICD-10-CM | POA: Diagnosis not present

## 2018-03-04 DIAGNOSIS — IMO0001 Reserved for inherently not codable concepts without codable children: Secondary | ICD-10-CM

## 2018-03-04 LAB — CMP14+EGFR
ALT: 60 IU/L — AB (ref 0–44)
AST: 29 IU/L (ref 0–40)
Albumin/Globulin Ratio: 2 (ref 1.2–2.2)
Albumin: 4.5 g/dL (ref 3.5–5.5)
Alkaline Phosphatase: 71 IU/L (ref 39–117)
BILIRUBIN TOTAL: 0.5 mg/dL (ref 0.0–1.2)
BUN/Creatinine Ratio: 15 (ref 9–20)
BUN: 14 mg/dL (ref 6–24)
CHLORIDE: 101 mmol/L (ref 96–106)
CO2: 20 mmol/L (ref 20–29)
Calcium: 9.5 mg/dL (ref 8.7–10.2)
Creatinine, Ser: 0.96 mg/dL (ref 0.76–1.27)
GFR calc non Af Amer: 87 mL/min/{1.73_m2} (ref >59)
GFR, EST AFRICAN AMERICAN: 100 mL/min/{1.73_m2} (ref >59)
GLUCOSE: 206 mg/dL — AB (ref 65–99)
Globulin, Total: 2.3 g/dL (ref 1.5–4.5)
Potassium: 4.6 mmol/L (ref 3.5–5.2)
Sodium: 138 mmol/L (ref 134–144)
TOTAL PROTEIN: 6.8 g/dL (ref 6.0–8.5)

## 2018-03-04 LAB — LIPID PANEL
CHOL/HDL RATIO: 5.1 ratio — AB (ref 0.0–5.0)
CHOLESTEROL TOTAL: 203 mg/dL — AB (ref 100–199)
HDL: 40 mg/dL (ref >39)
LDL Calculated: 100 mg/dL — ABNORMAL HIGH (ref 0–99)
Triglycerides: 314 mg/dL — ABNORMAL HIGH (ref 0–149)
VLDL Cholesterol Cal: 63 mg/dL — ABNORMAL HIGH (ref 5–40)

## 2018-03-04 LAB — BAYER DCA HB A1C WAIVED: HB A1C: 9.3 % — AB (ref <7.0)

## 2018-03-04 MED ORDER — TAMSULOSIN HCL 0.4 MG PO CAPS: 0.8000 mg | ORAL_CAPSULE | Freq: Every day | ORAL | 3 refills | Status: DC

## 2018-03-04 MED ORDER — EMPAGLIFLOZIN 10 MG PO TABS: 10.0000 mg | ORAL_TABLET | Freq: Every day | ORAL | 3 refills | Status: DC

## 2018-03-04 MED ORDER — MONTELUKAST SODIUM 10 MG PO TABS: 10.0000 mg | ORAL_TABLET | Freq: Every day | ORAL | 3 refills | Status: DC

## 2018-03-04 MED ORDER — METFORMIN HCL ER 750 MG PO TB24: 750.0000 mg | ORAL_TABLET | Freq: Two times a day (BID) | ORAL | 3 refills | Status: DC

## 2018-03-04 MED ORDER — FLUTICASONE PROPIONATE 50 MCG/ACT NA SUSP: 2.0000 | Freq: Every day | NASAL | 6 refills | Status: DC

## 2018-03-04 NOTE — Progress Notes (Signed)
BP 125/84   Pulse 74   Ht 6' 1"  (1.854 m)   Wt 274 lb 9.6 oz (124.6 kg)   BMI 36.23 kg/m    Subjective:    Patient ID: [name] [name], male    DOB: 11-Dec-1960, 57 y.o.   MRN: [id number]  HPI: [name] [name] is a 57 y.o. male presenting on 03/04/2018 for Diabetes (58 month ) and Hyperlipidemia  This patient comes in for 50-monthfollow-up on his chronic medical conditions which do include type 2 diabetes uncontrolled, allergic rhinitis, BPH.  He does not report any problems at this time.  He states overall his medications are doing well.  He states he does not check his sugar very often and does have some dietary indiscretions.  He states the highest that he is in his sugar was in the 200s at one point.  But he knows is related to something he ate.  Past Medical History:  Diagnosis Date  . Arthritis   . Asthma   . Blood in stool   . [place] ([place] obstructive pulmonary disease) [place]   . Diabetes [place] ([place] 31/02  type 2  . GERD (gastroesophageal reflux disease)   . Hay fever   . Hx of colonic polyps   . Migraines   . PONV (postoperative nausea and vomiting)    also history of motion sickness   Relevant past medical, surgical, family and social history reviewed and updated as indicated. Interim medical history since our last visit reviewed. Allergies and medications reviewed and updated. DATA REVIEWED: CHART IN EPIC  Family History reviewed for pertinent findings.  Review of Systems  Constitutional: Negative.  Negative for appetite change and fatigue.  Eyes: Negative for pain and visual disturbance.  Respiratory: Negative.  Negative for cough, chest tightness, shortness of breath and wheezing.   Cardiovascular: Negative.  Negative for chest pain, palpitations and leg swelling.  Gastrointestinal: Negative.  Negative for abdominal pain, diarrhea, nausea and vomiting.  Genitourinary: Negative.   Skin: Negative.  Negative for color change  and rash.  Neurological: Negative.  Negative for weakness, numbness and headaches.  Psychiatric/Behavioral: Negative.     Allergies as of 03/04/2018   No Known Allergies     Medication List        Accurate as of 03/04/18  8:20 AM. Always use your most recent med list.          albuterol 108 (90 Base) MCG/ACT inhaler Commonly known as:  PROVENTIL HFA;VENTOLIN HFA Inhale 2 puffs into the lungs every 6 (six) hours as needed for wheezing or shortness of breath.   ALEVE 220 MG tablet Generic drug:  naproxen sodium Take 220 mg by mouth 2 (two) times daily with a meal.   Azelastine-Fluticasone 137-50 MCG/ACT Susp Use 2 sprays into each nostril once per day.   beclomethasone 80 MCG/ACT inhaler Commonly known as:  QVAR Inhale 2 puffs into the lungs 2 (two) times daily.   cyclobenzaprine 5 MG tablet Commonly known as:  FLEXERIL Take one to two at night as needed.   empagliflozin 10 MG Tabs tablet Commonly known as:  JARDIANCE Take 10 mg by mouth daily.   fluticasone 50 MCG/ACT nasal spray Commonly known as:  FLONASE Place 2 sprays into both nostrils daily.   glucose blood test strip True Test Strips. Check blood sugars three times per day. E11.65.   levocetirizine 5 MG tablet Commonly known as:  XYZAL Take 1 tablet (5 mg total)  by mouth every evening.   metFORMIN 750 MG 24 hr tablet Commonly known as:  GLUCOPHAGE-XR Take 1 tablet (750 mg total) by mouth 2 (two) times daily.   montelukast 10 MG tablet Commonly known as:  SINGULAIR Take 1 tablet (10 mg total) by mouth at bedtime.   omeprazole 40 MG capsule Commonly known as:  PRILOSEC Take 1 capsule (40 mg total) by mouth daily as needed. For acid reflux   tamsulosin 0.4 MG Caps capsule Commonly known as:  FLOMAX Take 2 capsules (0.8 mg total) by mouth daily.   traMADol 50 MG tablet Commonly known as:  ULTRAM Take 1 tablet (50 mg total) by mouth every 6 (six) hours as needed.   TRUEPLUS LANCETS 33G Misc True  Lancets. Check Blood Sugars Three times  Per Day. DX: E11.65          Objective:    BP 125/84   Pulse 74   Ht 6' 1"  (1.854 m)   Wt 274 lb 9.6 oz (124.6 kg)   BMI 36.23 kg/m   No Known Allergies  Wt Readings from Last 3 Encounters:  03/04/18 274 lb 9.6 oz (124.6 kg)  08/27/17 275 lb 3.2 oz (124.8 kg)  07/16/17 272 lb (123.4 kg)    Physical Exam  Constitutional: He appears well-developed and well-nourished. No distress.  HENT:  Head: Normocephalic and atraumatic.  Eyes: Pupils are equal, round, and reactive to light. Conjunctivae and EOM are normal.  Cardiovascular: Normal rate, regular rhythm and normal heart sounds.  Pulmonary/Chest: Effort normal and breath sounds normal. No respiratory distress.  Skin: Skin is warm and dry.  Psychiatric: He has a normal mood and affect. His behavior is normal.  Nursing note and vitals reviewed.   Results for orders placed or performed in visit on 08/27/17  CBC with Differential/Platelet  Result Value Ref Range   WBC 5.5 3.4 - 10.8 x10E3/uL   RBC 5.48 4.14 - 5.80 x10E6/uL   Hemoglobin 16.5 13.0 - 17.7 g/dL   Hematocrit 50.0 37.5 - 51.0 %   MCV 91 79 - 97 fL   MCH 30.1 26.6 - 33.0 pg   MCHC 33.0 31.5 - 35.7 g/dL   RDW 14.3 12.3 - 15.4 %   Platelets 185 150 - 379 x10E3/uL   Neutrophils 60 Not Estab. %   Lymphs 28 Not Estab. %   Monocytes 6 Not Estab. %   Eos 4 Not Estab. %   Basos 2 Not Estab. %   Neutrophils Absolute 3.3 1.4 - 7.0 x10E3/uL   Lymphocytes Absolute 1.6 0.7 - 3.1 x10E3/uL   Monocytes Absolute 0.3 0.1 - 0.9 x10E3/uL   EOS (ABSOLUTE) 0.2 0.0 - 0.4 x10E3/uL   Basophils Absolute 0.1 0.0 - 0.2 x10E3/uL   Immature Granulocytes 0 Not Estab. %   Immature Grans (Abs) 0.0 0.0 - 0.1 x10E3/uL  CMP14+EGFR  Result Value Ref Range   Glucose 161 (H) 65 - 99 mg/dL   BUN 11 6 - 24 mg/dL   Creatinine, Ser 0.98 0.76 - 1.27 mg/dL   GFR calc non Af Amer 85 >59 mL/min/1.73   GFR calc Af Amer 99 >59 mL/min/1.73   BUN/Creatinine  Ratio 11 9 - 20   Sodium 140 134 - 144 mmol/L   Potassium 5.0 3.5 - 5.2 mmol/L   Chloride 103 96 - 106 mmol/L   CO2 19 (L) 20 - 29 mmol/L   Calcium 9.5 8.7 - 10.2 mg/dL   Total Protein 6.9 6.0 - 8.5 g/dL  Albumin 4.7 3.5 - 5.5 g/dL   Globulin, Total 2.2 1.5 - 4.5 g/dL   Albumin/Globulin Ratio 2.1 1.2 - 2.2   Bilirubin Total 0.5 0.0 - 1.2 mg/dL   Alkaline Phosphatase 61 39 - 117 IU/L   AST 21 0 - 40 IU/L   ALT 47 (H) 0 - 44 IU/L  Hgb A1c w/o eAG  Result Value Ref Range   Hgb A1c MFr Bld 8.9 (H) 4.8 - 5.6 %  Specimen status report  Result Value Ref Range   specimen status report Comment       Assessment & Plan:   1. Uncontrolled type 2 diabetes mellitus with hyperglycemia (HCC) - Lipid panel - CMP14+EGFR - Bayer DCA Hb A1c Waived  2. Uncontrolled type 2 diabetes mellitus without complication, without long-term current use of insulin ([facility]) - metFORMIN (GLUCOPHAGE-XR) 750 MG 24 hr tablet; Take 1 tablet (750 mg total) by mouth 2 (two) times daily.  Dispense: 180 tablet; Refill: 3 - empagliflozin (JARDIANCE) 10 MG TABS tablet; Take 10 mg by mouth daily.  Dispense: 90 tablet; Refill: 3  3. Allergic rhinitis due to pollen, unspecified seasonality - montelukast (SINGULAIR) 10 MG tablet; Take 1 tablet (10 mg total) by mouth at bedtime.  Dispense: 90 tablet; Refill: 3  4. Benign prostatic hyperplasia with incomplete bladder emptying - tamsulosin (FLOMAX) 0.4 MG CAPS capsule; Take 2 capsules (0.8 mg total) by mouth daily.  Dispense: 180 capsule; Refill: 3   Continue all other maintenance medications as listed above.  Follow up plan: Return in about 3 months (around 06/03/2018).  Educational handout given for [place] PA-C [facility] [address]  [place], [place] [address] [phone]   03/04/2018, 8:20 AM

## 2018-05-01 DIAGNOSIS — R030 Elevated blood-pressure reading, without diagnosis of hypertension: Secondary | ICD-10-CM | POA: Diagnosis not present

## 2018-05-01 DIAGNOSIS — M5416 Radiculopathy, lumbar region: Secondary | ICD-10-CM | POA: Diagnosis not present

## 2018-05-01 DIAGNOSIS — M545 Low back pain: Secondary | ICD-10-CM | POA: Diagnosis not present

## 2018-05-01 DIAGNOSIS — M5126 Other intervertebral disc displacement, lumbar region: Secondary | ICD-10-CM | POA: Diagnosis not present

## 2018-05-01 DIAGNOSIS — Z6836 Body mass index (BMI) 36.0-36.9, adult: Secondary | ICD-10-CM | POA: Diagnosis not present

## 2018-05-27 ENCOUNTER — Encounter: Payer: Self-pay | Admitting: Family Medicine

## 2018-05-27 ENCOUNTER — Ambulatory Visit: Payer: 59 | Admitting: Family Medicine

## 2018-05-27 VITALS — BP 131/88 | HR 104 | Temp 97.9°F | Ht 73.0 in | Wt 261.0 lb

## 2018-05-27 DIAGNOSIS — J029 Acute pharyngitis, unspecified: Secondary | ICD-10-CM | POA: Diagnosis not present

## 2018-05-27 DIAGNOSIS — J208 Acute bronchitis due to other specified organisms: Secondary | ICD-10-CM

## 2018-05-27 DIAGNOSIS — J9801 Acute bronchospasm: Secondary | ICD-10-CM

## 2018-05-27 DIAGNOSIS — B9689 Other specified bacterial agents as the cause of diseases classified elsewhere: Secondary | ICD-10-CM | POA: Diagnosis not present

## 2018-05-27 LAB — RAPID STREP SCREEN (MED CTR MEBANE ONLY): Strep Gp A Ag, IA W/Reflex: NEGATIVE

## 2018-05-27 LAB — CULTURE, GROUP A STREP

## 2018-05-27 MED ORDER — GUAIFENESIN-CODEINE 100-10 MG/5ML PO SOLN: 5.0000 mL | Freq: Four times a day (QID) | ORAL | 0 refills | Status: DC | PRN

## 2018-05-27 MED ORDER — AZITHROMYCIN 250 MG PO TABS: ORAL_TABLET | ORAL | 0 refills | Status: DC

## 2018-05-27 MED ORDER — BENZONATATE 100 MG PO CAPS: 100.0000 mg | ORAL_CAPSULE | Freq: Three times a day (TID) | ORAL | 0 refills | Status: DC | PRN

## 2018-05-27 MED ORDER — ALBUTEROL SULFATE 108 (90 BASE) MCG/ACT IN AEPB: 2.0000 | INHALATION_SPRAY | RESPIRATORY_TRACT | 0 refills | Status: DC

## 2018-05-27 NOTE — Patient Instructions (Signed)
I have prescribed you a Z-Pak for the bronchitis.  I have also given you 2 different cough medications.  The benzonatate you can take during the day if you need to work and at night, you may use the Robitussin with codeine.  Again, we discussed that the codeine may cause sedation and therefore you should not operate any heavy machinery or drive while taking.  I have also renewed your albuterol.  Use this every 6 hours for the next 2 days then as needed as directed.   If your symptoms worsen, you develop blood in your cough, shortness of breath or high fevers, please seek immediate medical attention.   Acute Bronchitis, Adult Acute bronchitis is when air tubes (bronchi) in the lungs suddenly get swollen. The condition can make it hard to breathe. It can also cause these symptoms:  A cough.  Coughing up clear, yellow, or green mucus.  Wheezing.  Chest congestion.  Shortness of breath.  A fever.  Body aches.  Chills.  A sore throat.  Follow these instructions at home: Medicines  Take over-the-counter and prescription medicines only as told by your doctor.  If you were prescribed an antibiotic medicine, take it as told by your doctor. Do not stop taking the antibiotic even if you start to feel better. General instructions  Rest.  Drink enough fluids to keep your pee (urine) clear or pale yellow.  Avoid smoking and secondhand smoke. If you smoke and you need help quitting, ask your doctor. Quitting will help your lungs heal faster.  Use an inhaler, cool mist vaporizer, or humidifier as told by your doctor.  Keep all follow-up visits as told by your doctor. This is important. How is this prevented? To lower your risk of getting this condition again:  Wash your hands often with soap and water. If you cannot use soap and water, use hand sanitizer.  Avoid contact with people who have cold symptoms.  Try not to touch your hands to your mouth, nose, or eyes.  Make sure to get  the flu shot every year.  Contact a doctor if:  Your symptoms do not get better in 2 weeks. Get help right away if:  You cough up blood.  You have chest pain.  You have very bad shortness of breath.  You become dehydrated.  You faint (pass out) or keep feeling like you are going to pass out.  You keep throwing up (vomiting).  You have a very bad headache.  Your fever or chills gets worse. This information is not intended to replace advice given to you by your health care provider. Make sure you discuss any questions you have with your health care provider. Document Released: 11/29/2007 Document Revised: 01/19/2016 Document Reviewed: 12/01/2015 Elsevier Interactive Patient Education  [employer].

## 2018-05-27 NOTE — Progress Notes (Signed)
Subjective: CC: Cough PCP: Terald Sleeper, PA-C [name] [name] is a 57 y.o. male presenting to clinic today for:  1. Cough Patient reports onset of dry cough on Wednesday.  He reports associated sore throat.  Symptoms have worsened.  Denies any hemoptysis, purulence from the nares.  He does report wheezing and rattling in his chest.  No known sick contacts.  He has been using Mucinex, Delsym, NyQuil, DayQuil with little improvement in symptoms.  He has an associated headache as well.   ROS: Per HPI  No Known Allergies Past Medical History:  Diagnosis Date  . Arthritis   . Asthma   . Blood in stool   . COPD (chronic obstructive pulmonary disease) ([place])   . Diabetes mellitus without complication ([place]) 0/09   type 2  . GERD (gastroesophageal reflux disease)   . Hay fever   . Hx of colonic polyps   . Migraines   . PONV (postoperative nausea and vomiting)    also history of motion sickness    Current Outpatient Medications:  .  albuterol (PROVENTIL HFA;VENTOLIN HFA) 108 (90 Base) MCG/ACT inhaler, Inhale 2 puffs into the lungs every 6 (six) hours as needed for wheezing or shortness of breath., Disp: 1 Inhaler, Rfl: 2 .  cyclobenzaprine (FLEXERIL) 5 MG tablet, Take one to two at night as needed., Disp: 30 tablet, Rfl: 1 .  empagliflozin (JARDIANCE) 10 MG TABS tablet, Take 10 mg by mouth daily., Disp: 90 tablet, Rfl: 3 .  glucose blood test strip, True Test Strips. Check blood sugars three times per day. E11.65., Disp: 300 each, Rfl: 3 .  levocetirizine (XYZAL) 5 MG tablet, Take 1 tablet (5 mg total) by mouth every evening., Disp: 90 tablet, Rfl: 3 .  metFORMIN (GLUCOPHAGE-XR) 750 MG 24 hr tablet, Take 1 tablet (750 mg total) by mouth 2 (two) times daily., Disp: 180 tablet, Rfl: 3 .  montelukast (SINGULAIR) 10 MG tablet, Take 1 tablet (10 mg total) by mouth at bedtime., Disp: 90 tablet, Rfl: 3 .  naproxen sodium (ALEVE) 220 MG tablet, Take 220 mg by mouth 2 (two) times daily  with a meal., Disp: , Rfl:  .  omeprazole (PRILOSEC) 40 MG capsule, Take 1 capsule (40 mg total) by mouth daily as needed. For acid reflux, Disp: 90 capsule, Rfl: 1 .  tamsulosin (FLOMAX) 0.4 MG CAPS capsule, Take 2 capsules (0.8 mg total) by mouth daily., Disp: 180 capsule, Rfl: 3 .  traMADol (ULTRAM) 50 MG tablet, Take 1 tablet (50 mg total) by mouth every 6 (six) hours as needed., Disp: 60 tablet, Rfl: 1 .  TRUEPLUS LANCETS 33G MISC, True Lancets. Check Blood Sugars Three times  Per Day. DX: E11.65, Disp: 300 each, Rfl: 3 .  Azelastine-Fluticasone 137-50 MCG/ACT SUSP, Use 2 sprays into each nostril once per day. (Patient not taking: Reported on 05/27/2018), Disp: 23 g, Rfl: 2 .  beclomethasone (QVAR) 80 MCG/ACT inhaler, Inhale 2 puffs into the lungs 2 (two) times daily., Disp: , Rfl:  .  fluticasone (FLONASE) 50 MCG/ACT nasal spray, Place 2 sprays into both nostrils daily. (Patient not taking: Reported on 05/27/2018), Disp: 16 g, Rfl: 6 Social History   Socioeconomic History  . Marital status: Married    Spouse name: Not on file  . Number of children: Not on file  . Years of education: Not on file  . Highest education level: Not on file  Occupational History  . Not on file  Social Needs  . Financial  resource strain: Not on file  . Food insecurity:    Worry: Not on file    Inability: Not on file  . Transportation needs:    Medical: Not on file    Non-medical: Not on file  Tobacco Use  . Smoking status: Former Research scientist (life sciences)  . Smokeless tobacco: Never Used  Substance and Sexual Activity  . Alcohol use: Yes    Comment: occasional  . Drug use: No  . Sexual activity: Not on file  Lifestyle  . Physical activity:    Days per week: Not on file    Minutes per session: Not on file  . Stress: Not on file  Relationships  . Social connections:    Talks on phone: Not on file    Gets together: Not on file    Attends religious service: Not on file    Active member of club or organization: Not  on file    Attends meetings of clubs or organizations: Not on file    Relationship status: Not on file  . Intimate partner violence:    Fear of current or ex partner: Not on file    Emotionally abused: Not on file    Physically abused: Not on file    Forced sexual activity: Not on file  Other Topics Concern  . Not on file  Social History Narrative  . Not on file   Family History  Problem Relation Age of Onset  . Cancer Maternal Uncle        colon  . Cancer Maternal Uncle   . Cancer Maternal Uncle   . Cancer Maternal Uncle   . Cancer Maternal Uncle   . Cancer Maternal Uncle   . Cancer Mother        colon, breast   . Diabetes Mother   . Diabetes Father        type II  . Cancer Maternal Grandmother        breast, colon   . Diabetes Brother   . Diabetes Paternal Grandfather     Objective: Office vital signs reviewed. BP 131/88   Pulse (!) 104   Temp 97.9 F (36.6 C) (Oral)   Ht 6\' 1"  (1.854 m)   Wt 261 lb (118.4 kg)   SpO2 94%   BMI 34.43 kg/m   Physical Examination:  General: Awake, alert, nontoxic, No acute distress HEENT: Normal    Neck: No masses palpated. No lymphadenopathy    Ears: Tympanic membranes intact, normal light reflex, no erythema, no bulging; there is an effusion behind the right tympanic membrane that is nonpurulent.    Eyes: PERRLA, extraocular membranes intact, sclera white    Nose: nasal turbinates moist, opaque yellow nasal discharge    Throat: moist mucus membranes, moderate oropharyngeal erythema, no tonsillar exudate.  Airway is patent Cardio: regular rate and rhythm, S1S2 heard, no murmurs appreciated Pulm: Mild expiratory wheezes noted.  No rhonchi or rales; normal work of breathing on room air  Assessment/ Plan: 57 y.o. male   1. Acute bacterial bronchitis Patient is afebrile nontoxic-appearing.  Physical exam was notable for mild expiratory wheezes, what appears to be purulent nasal discharge.  I have placed him on an oral  antibiotic, prescribed Tessalon Perles and Robitussin-AC and refilled his albuterol.  Home care instructions were reviewed with the patient.  Reasons return discussed.  Work note provided.  Follow-up PRN. - Albuterol Sulfate (PROAIR RESPICLICK) 034 (90 Base) MCG/ACT AEPB; Inhale 2 puffs into the lungs every 6 (six) weeks.  Dispense: 1 each; Refill: 0  2. Bronchospasm  3. Sore throat Strep negative. - Rapid Strep Screen ([facility] ONLY) - Culture, Group A Strep   Orders Placed This Encounter  Procedures  . Rapid Strep Screen ([facility] ONLY)  . Culture, Group A Strep    Order Specific Question:   Source    Answer:   throat   Meds ordered this encounter  Medications  . guaiFENesin-codeine 100-10 MG/5ML syrup    Sig: Take 5 mLs by mouth every 6 (six) hours as needed for cough.    Dispense:  120 mL    Refill:  0  . azithromycin (ZITHROMAX) 250 MG tablet    Sig: Take 2 tablets today, then take 1 tablet daily until gone.    Dispense:  6 tablet    Refill:  0  . Albuterol Sulfate (PROAIR RESPICLICK) 026 (90 Base) MCG/ACT AEPB    Sig: Inhale 2 puffs into the lungs every 6 (six) weeks.    Dispense:  1 each    Refill:  0  . benzonatate (TESSALON PERLES) 100 MG capsule    Sig: Take 1 capsule (100 mg total) by mouth 3 (three) times daily as needed.    Dispense:  20 capsule    Refill:  0    The Narcotic Database has been reviewed.  There were no red flags.    Janora Norlander, DO [place] [phone]

## 2018-05-29 LAB — CULTURE, GROUP A STREP: Strep A Culture: NEGATIVE

## 2018-06-05 DIAGNOSIS — H524 Presbyopia: Secondary | ICD-10-CM | POA: Diagnosis not present

## 2018-06-11 ENCOUNTER — Encounter: Payer: Self-pay | Admitting: Physician Assistant

## 2018-06-11 ENCOUNTER — Ambulatory Visit: Payer: 59 | Admitting: Physician Assistant

## 2018-06-11 VITALS — BP 122/83 | HR 76 | Temp 98.1°F | Ht 73.0 in | Wt 264.0 lb

## 2018-06-11 DIAGNOSIS — Z23 Encounter for immunization: Secondary | ICD-10-CM

## 2018-06-11 DIAGNOSIS — E1165 Type 2 diabetes mellitus with hyperglycemia: Secondary | ICD-10-CM | POA: Diagnosis not present

## 2018-06-11 DIAGNOSIS — G8929 Other chronic pain: Secondary | ICD-10-CM

## 2018-06-11 DIAGNOSIS — M545 Low back pain, unspecified: Secondary | ICD-10-CM

## 2018-06-11 LAB — LIPID PANEL
CHOL/HDL RATIO: 4.2 ratio (ref 0.0–5.0)
Cholesterol, Total: 188 mg/dL (ref 100–199)
HDL: 45 mg/dL (ref >39)
LDL CALC: 105 mg/dL — AB (ref 0–99)
Triglycerides: 192 mg/dL — ABNORMAL HIGH (ref 0–149)
VLDL Cholesterol Cal: 38 mg/dL (ref 5–40)

## 2018-06-11 LAB — CMP14+EGFR
ALBUMIN: 4.6 g/dL (ref 3.5–5.5)
ALT: 37 IU/L (ref 0–44)
AST: 17 IU/L (ref 0–40)
Albumin/Globulin Ratio: 2.1 (ref 1.2–2.2)
Alkaline Phosphatase: 66 IU/L (ref 39–117)
BUN / CREAT RATIO: 16 (ref 9–20)
BUN: 16 mg/dL (ref 6–24)
Bilirubin Total: 0.6 mg/dL (ref 0.0–1.2)
CALCIUM: 9.6 mg/dL (ref 8.7–10.2)
CO2: 21 mmol/L (ref 20–29)
CREATININE: 1.03 mg/dL (ref 0.76–1.27)
Chloride: 100 mmol/L (ref 96–106)
GFR, EST AFRICAN AMERICAN: 92 mL/min/{1.73_m2} (ref >59)
GFR, EST NON AFRICAN AMERICAN: 80 mL/min/{1.73_m2} (ref >59)
GLOBULIN, TOTAL: 2.2 g/dL (ref 1.5–4.5)
Glucose: 135 mg/dL — ABNORMAL HIGH (ref 65–99)
Potassium: 4.6 mmol/L (ref 3.5–5.2)
SODIUM: 138 mmol/L (ref 134–144)
Total Protein: 6.8 g/dL (ref 6.0–8.5)

## 2018-06-11 LAB — CBC WITH DIFFERENTIAL/PLATELET
Basophils Absolute: 0.1 10*3/uL (ref 0.0–0.2)
Basos: 2 %
EOS (ABSOLUTE): 0.2 10*3/uL (ref 0.0–0.4)
EOS: 3 %
HEMATOCRIT: 51.8 % — AB (ref 37.5–51.0)
HEMOGLOBIN: 17.3 g/dL (ref 13.0–17.7)
Immature Grans (Abs): 0 10*3/uL (ref 0.0–0.1)
Immature Granulocytes: 0 %
LYMPHS ABS: 1.2 10*3/uL (ref 0.7–3.1)
Lymphs: 22 %
MCH: 29.3 pg (ref 26.6–33.0)
MCHC: 33.4 g/dL (ref 31.5–35.7)
MCV: 88 fL (ref 79–97)
MONOCYTES: 7 %
Monocytes Absolute: 0.4 10*3/uL (ref 0.1–0.9)
Neutrophils Absolute: 3.8 10*3/uL (ref 1.4–7.0)
Neutrophils: 66 %
Platelets: 205 10*3/uL (ref 150–450)
RBC: 5.9 x10E6/uL — AB (ref 4.14–5.80)
RDW: 13 % (ref 12.3–15.4)
WBC: 5.7 10*3/uL (ref 3.4–10.8)

## 2018-06-11 LAB — BAYER DCA HB A1C WAIVED: HB A1C (BAYER DCA - WAIVED): 7.6 % — ABNORMAL HIGH (ref <7.0)

## 2018-06-11 MED ORDER — ALBUTEROL SULFATE HFA 108 (90 BASE) MCG/ACT IN AERS: 2.0000 | INHALATION_SPRAY | Freq: Four times a day (QID) | RESPIRATORY_TRACT | 2 refills | Status: DC | PRN

## 2018-06-11 NOTE — Progress Notes (Signed)
BP 122/83   Pulse 76   Temp 98.1 F (36.7 C) (Oral)   Ht 6' 1"  (1.854 m)   Wt 264 lb (119.7 kg)   BMI 34.83 kg/m    Subjective:    Patient ID: [name] [name], male    DOB: February 29, 1961, 57 y.o.   MRN: [id number]  HPI: [name] is a 57 y.o. male presenting on 06/11/2018 for Diabetes (3 month follow up ) and Hyperlipidemia  This patient comes in for periodic recheck on medications and conditions including uncontrolled diabetes, chronic low back pain due to degenerative disc.  Most recent MRI did show some changes.  He needs to continue with his neurosurgeon.  All of his medications are reviewed.  We have discussed possibly adding something like Trulicity or Ozempic to his regimen after his labs come back..   Shallow left subarticular disc protrusion at L4-5, contacting and potentially irritating the descending left L5 nerve root. 2. Left eccentric disc osteophyte at L3-4 with resultant mild left lateral recess and L3 foraminal stenosis. 3. Additional mild degenerative spondylolysis at L1-2 and L2-3 without significant stenosis or neural impingement. All medications are reviewed today. There are no reports of any problems with the medications. All of the medical conditions are reviewed and updated.  Lab work is reviewed and will be ordered as medically necessary. There are no new problems reported with today's visit.   Past Medical History:  Diagnosis Date  . Arthritis   . Asthma   . Blood in stool   . COPD (chronic obstructive pulmonary disease) ([place])   . Diabetes mellitus without complication ([place]) 7/26   type 2  . GERD (gastroesophageal reflux disease)   . Hay fever   . Hx of colonic polyps   . Migraines   . PONV (postoperative nausea and vomiting)    also history of motion sickness   Relevant past medical, surgical, family and social history reviewed and updated as indicated. Interim medical history since our last visit reviewed. Allergies and  medications reviewed and updated. DATA REVIEWED: CHART IN EPIC  Family History reviewed for pertinent findings.  Review of Systems  Constitutional: Negative.  Negative for appetite change and fatigue.  HENT: Negative.   Eyes: Negative.  Negative for pain and visual disturbance.  Respiratory: Negative.  Negative for cough, chest tightness, shortness of breath and wheezing.   Cardiovascular: Negative.  Negative for chest pain, palpitations and leg swelling.  Gastrointestinal: Negative.  Negative for abdominal pain, diarrhea, nausea and vomiting.  Endocrine: Negative.   Genitourinary: Negative.   Musculoskeletal: Positive for arthralgias, back pain and gait problem.  Skin: Negative.  Negative for color change and rash.  Neurological: Negative for weakness, numbness and headaches.  Psychiatric/Behavioral: Negative.     Allergies as of 06/11/2018   No Known Allergies     Medication List       Accurate as of June 11, 2018 11:59 PM. Always use your most recent med list.        albuterol 108 (90 Base) MCG/ACT inhaler Commonly known as:  PROVENTIL HFA;VENTOLIN HFA Inhale 2 puffs into the lungs every 6 (six) hours as needed for wheezing or shortness of breath.   ALEVE 220 MG tablet Generic drug:  naproxen sodium Take 220 mg by mouth 2 (two) times daily with a meal.   Azelastine-Fluticasone 137-50 MCG/ACT Susp Use 2 sprays into each nostril once per day.   beclomethasone 80 MCG/ACT inhaler Commonly known as:  QVAR Inhale  2 puffs into the lungs 2 (two) times daily.   cyclobenzaprine 5 MG tablet Commonly known as:  FLEXERIL Take one to two at night as needed.   empagliflozin 10 MG Tabs tablet Commonly known as:  JARDIANCE Take 10 mg by mouth daily.   fluticasone 50 MCG/ACT nasal spray Commonly known as:  FLONASE Place 2 sprays into both nostrils daily.   glucose blood test strip True Test Strips. Check blood sugars three times per day. E11.65.   levocetirizine 5 MG  tablet Commonly known as:  XYZAL Take 1 tablet (5 mg total) by mouth every evening.   metFORMIN 750 MG 24 hr tablet Commonly known as:  GLUCOPHAGE-XR Take 1 tablet (750 mg total) by mouth 2 (two) times daily.   montelukast 10 MG tablet Commonly known as:  SINGULAIR Take 1 tablet (10 mg total) by mouth at bedtime.   omeprazole 40 MG capsule Commonly known as:  PRILOSEC Take 1 capsule (40 mg total) by mouth daily as needed. For acid reflux   tamsulosin 0.4 MG Caps capsule Commonly known as:  FLOMAX Take 2 capsules (0.8 mg total) by mouth daily.   traMADol 50 MG tablet Commonly known as:  ULTRAM Take 1 tablet (50 mg total) by mouth every 6 (six) hours as needed.   TRUEPLUS LANCETS 33G Misc True Lancets. Check Blood Sugars Three times  Per Day. DX: E11.65          Objective:    BP 122/83   Pulse 76   Temp 98.1 F (36.7 C) (Oral)   Ht 6' 1"  (1.854 m)   Wt 264 lb (119.7 kg)   BMI 34.83 kg/m   No Known Allergies  Wt Readings from Last 3 Encounters:  06/11/18 264 lb (119.7 kg)  05/27/18 261 lb (118.4 kg)  03/04/18 274 lb 9.6 oz (124.6 kg)    Physical Exam Vitals signs and nursing note reviewed.  Constitutional:      General: He is not in acute distress.    Appearance: He is well-developed.  HENT:     Head: Normocephalic and atraumatic.  Eyes:     Conjunctiva/sclera: Conjunctivae normal.     Pupils: Pupils are equal, round, and reactive to light.  Cardiovascular:     Rate and Rhythm: Normal rate and regular rhythm.     Heart sounds: Normal heart sounds.  Pulmonary:     Effort: Pulmonary effort is normal. No respiratory distress.     Breath sounds: Normal breath sounds.  Musculoskeletal:     Lumbar back: He exhibits decreased range of motion, pain and spasm.       Back:  Skin:    General: Skin is warm and dry.  Psychiatric:        Behavior: Behavior normal.     Results for orders placed or performed in visit on 06/11/18  CBC with Differential/Platelet   Result Value Ref Range   WBC 5.7 3.4 - 10.8 x10E3/uL   RBC 5.90 (H) 4.14 - 5.80 x10E6/uL   Hemoglobin 17.3 13.0 - 17.7 g/dL   Hematocrit 51.8 (H) 37.5 - 51.0 %   MCV 88 79 - 97 fL   MCH 29.3 26.6 - 33.0 pg   MCHC 33.4 31.5 - 35.7 g/dL   RDW 13.0 12.3 - 15.4 %   Platelets 205 150 - 450 x10E3/uL   Neutrophils 66 Not Estab. %   Lymphs 22 Not Estab. %   Monocytes 7 Not Estab. %   Eos 3 Not Estab. %  Basos 2 Not Estab. %   Neutrophils Absolute 3.8 1.4 - 7.0 x10E3/uL   Lymphocytes Absolute 1.2 0.7 - 3.1 x10E3/uL   Monocytes Absolute 0.4 0.1 - 0.9 x10E3/uL   EOS (ABSOLUTE) 0.2 0.0 - 0.4 x10E3/uL   Basophils Absolute 0.1 0.0 - 0.2 x10E3/uL   Immature Granulocytes 0 Not Estab. %   Immature Grans (Abs) 0.0 0.0 - 0.1 x10E3/uL  CMP14+EGFR  Result Value Ref Range   Glucose 135 (H) 65 - 99 mg/dL   BUN 16 6 - 24 mg/dL   Creatinine, Ser 1.03 0.76 - 1.27 mg/dL   GFR calc non Af Amer 80 >59 mL/min/1.73   GFR calc Af Amer 92 >59 mL/min/1.73   BUN/Creatinine Ratio 16 9 - 20   Sodium 138 134 - 144 mmol/L   Potassium 4.6 3.5 - 5.2 mmol/L   Chloride 100 96 - 106 mmol/L   CO2 21 20 - 29 mmol/L   Calcium 9.6 8.7 - 10.2 mg/dL   Total Protein 6.8 6.0 - 8.5 g/dL   Albumin 4.6 3.5 - 5.5 g/dL   Globulin, Total 2.2 1.5 - 4.5 g/dL   Albumin/Globulin Ratio 2.1 1.2 - 2.2   Bilirubin Total 0.6 0.0 - 1.2 mg/dL   Alkaline Phosphatase 66 39 - 117 IU/L   AST 17 0 - 40 IU/L   ALT 37 0 - 44 IU/L  Lipid panel  Result Value Ref Range   Cholesterol, Total 188 100 - 199 mg/dL   Triglycerides 192 (H) 0 - 149 mg/dL   HDL 45 >39 mg/dL   VLDL Cholesterol Cal 38 5 - 40 mg/dL   LDL Calculated 105 (H) 0 - 99 mg/dL   Chol/HDL Ratio 4.2 0.0 - 5.0 ratio  Bayer DCA Hb A1c Waived  Result Value Ref Range   HB A1C (BAYER DCA - WAIVED) 7.6 (H) <7.0 %      Assessment & Plan:   1. Uncontrolled type 2 diabetes mellitus with hyperglycemia (HCC) - CBC with Differential/Platelet - CMP14+EGFR - Lipid panel - Bayer DCA  Hb A1c Waived  2. Chronic low back pain, unspecified back pain laterality, unspecified whether sciatica present continue medications  3. Need for immunization against influenza - Flu Vaccine QUAD 36+ mos IM   Continue all other maintenance medications as listed above.  Follow up plan: Return in about 3 months (around 09/10/2018).  Educational handout given for [place] PA-C [facility] [address]  [place], [place] [address] [phone]   06/16/2018, 10:14 PM

## 2018-06-12 ENCOUNTER — Other Ambulatory Visit: Payer: Self-pay | Admitting: Physician Assistant

## 2018-06-12 MED ORDER — DULAGLUTIDE 0.75 MG/0.5ML ~~LOC~~ SOAJ: 0.7500 mg | SUBCUTANEOUS | 2 refills | Status: DC

## 2018-06-13 ENCOUNTER — Encounter: Payer: Self-pay | Admitting: Physician Assistant

## 2018-06-14 MED ORDER — DULAGLUTIDE 0.75 MG/0.5ML ~~LOC~~ SOAJ: 0.7500 mg | SUBCUTANEOUS | 2 refills | Status: DC

## 2018-06-14 NOTE — Addendum Note (Signed)
Addended by: Thana Ates on: 06/14/2018 10:27 AM   Modules accepted: Orders

## 2018-07-31 ENCOUNTER — Telehealth: Payer: Self-pay

## 2018-07-31 ENCOUNTER — Other Ambulatory Visit: Payer: Self-pay | Admitting: Physician Assistant

## 2018-07-31 DIAGNOSIS — M545 Low back pain, unspecified: Secondary | ICD-10-CM

## 2018-07-31 DIAGNOSIS — G8929 Other chronic pain: Secondary | ICD-10-CM

## 2018-07-31 DIAGNOSIS — M5126 Other intervertebral disc displacement, lumbar region: Secondary | ICD-10-CM

## 2018-07-31 NOTE — Telephone Encounter (Signed)
Order placed

## 2018-07-31 NOTE — Telephone Encounter (Signed)
Wife said he was suppose to be referred to [facility] about a month ago back pain

## 2018-08-07 DIAGNOSIS — M5136 Other intervertebral disc degeneration, lumbar region: Secondary | ICD-10-CM | POA: Insufficient documentation

## 2018-09-02 ENCOUNTER — Encounter: Payer: Self-pay | Admitting: Physician Assistant

## 2018-09-02 ENCOUNTER — Ambulatory Visit: Payer: No Typology Code available for payment source | Admitting: Physician Assistant

## 2018-09-02 VITALS — BP 115/80 | HR 85 | Ht 73.0 in | Wt 271.0 lb

## 2018-09-02 DIAGNOSIS — E1165 Type 2 diabetes mellitus with hyperglycemia: Secondary | ICD-10-CM | POA: Diagnosis not present

## 2018-09-02 DIAGNOSIS — J301 Allergic rhinitis due to pollen: Secondary | ICD-10-CM

## 2018-09-02 DIAGNOSIS — R3914 Feeling of incomplete bladder emptying: Secondary | ICD-10-CM

## 2018-09-02 DIAGNOSIS — N401 Enlarged prostate with lower urinary tract symptoms: Secondary | ICD-10-CM

## 2018-09-02 DIAGNOSIS — E785 Hyperlipidemia, unspecified: Secondary | ICD-10-CM

## 2018-09-02 DIAGNOSIS — Z0000 Encounter for general adult medical examination without abnormal findings: Secondary | ICD-10-CM

## 2018-09-02 LAB — BAYER DCA HB A1C WAIVED: HB A1C: 6.8 % (ref <7.0)

## 2018-09-02 MED ORDER — DULAGLUTIDE 0.75 MG/0.5ML ~~LOC~~ SOAJ: 0.7500 mg | SUBCUTANEOUS | 3 refills | Status: DC

## 2018-09-02 MED ORDER — LEVOCETIRIZINE DIHYDROCHLORIDE 5 MG PO TABS: 5.0000 mg | ORAL_TABLET | Freq: Every evening | ORAL | 3 refills | Status: DC

## 2018-09-02 MED ORDER — FLUTICASONE PROPIONATE 50 MCG/ACT NA SUSP: 2.0000 | Freq: Every day | NASAL | 3 refills | Status: DC

## 2018-09-02 NOTE — Progress Notes (Signed)
Notes recorded by Terald Sleeper, PA-C on 06/12/2018 at 4:17 PM EST Trulicity to the pharmacy. Start 0.75 mg injection once weekly. The pain comes with a preloaded needle. Please let us know if you have any questions. We will recheck labs in 3 months and at that time we will probably discontinue an oral medicine and increase the Trulicity.    BP 115/80   Pulse 85   Ht 6' 1" (1.854 m)   Wt 271 lb (122.9 kg)   BMI 35.75 kg/m    Subjective:    Patient ID: [name] [name], male    DOB: 07/27/1960, 58 y.o.   MRN: [id number]  HPI: [name] [name] is a 58 y.o. male presenting on 09/02/2018 for Diabetes (6 month ) and Hyperlipidemia  This patient comes in for three 55-monthrecheck on his diabetes and hyperlipidemia.  He was started on Trulicity 05.63mg at his last visit.  It is a once weekly injection.  He states he is tolerating it very well.  He has not had any change in his weight.  We will have labs drawn today to see if there is a change.  Reports overall he is feeling good.  His allergies are flaring up and he does need refills on some of his medications.  He will also come back in a few months and have labs performed for his annual labs.  Past Medical History:  Diagnosis Date  . Arthritis   . Asthma   . Blood in stool   . [place] disease) [place]   . Diabetes [place] complication ([place] 31/49  type 2  . GERD (gastroesophageal reflux disease)   . Hay fever   . Hx of colonic polyps   . Migraines   . PONV (postoperative nausea and vomiting)    also history of motion sickness   Relevant past medical, surgical, family and social history reviewed and updated as indicated. Interim medical history since our last visit reviewed. Allergies and medications reviewed and updated. DATA REVIEWED: CHART IN EPIC  Family History reviewed for pertinent findings.  Review of Systems  Constitutional: Negative.  Negative for appetite change and fatigue.    HENT: Negative.   Eyes: Negative.  Negative for pain and visual disturbance.  Respiratory: Negative.  Negative for cough, chest tightness, shortness of breath and wheezing.   Cardiovascular: Negative.  Negative for chest pain, palpitations and leg swelling.  Gastrointestinal: Negative.  Negative for abdominal pain, diarrhea, nausea and vomiting.  Endocrine: Negative.   Genitourinary: Negative.   Musculoskeletal: Positive for arthralgias and back pain.  Skin: Negative.  Negative for color change and rash.  Neurological: Negative.  Negative for weakness, numbness and headaches.  Psychiatric/Behavioral: Negative.     Allergies as of 09/02/2018   No Known Allergies     Medication List       Accurate as of September 02, 2018  9:58 PM. Always use your most recent med list.        albuterol 108 (90 Base) MCG/ACT inhaler Commonly known as:  PROVENTIL HFA;VENTOLIN HFA Inhale 2 puffs into the lungs every 6 (six) hours as needed for wheezing or shortness of breath.   Aleve 220 MG tablet Generic drug:  naproxen sodium Take 220 mg by mouth 2 (two) times daily with a meal.   Azelastine-Fluticasone 137-50 MCG/ACT Susp Use 2 sprays into each nostril once per day.   beclomethasone 80 MCG/ACT inhaler Commonly known as:  QVAR Inhale 2 puffs  into the lungs 2 (two) times daily.   cyclobenzaprine 5 MG tablet Commonly known as:  FLEXERIL Take one to two at night as needed.   Dulaglutide 0.75 MG/0.5ML Sopn Commonly known as:  Trulicity Inject 3.53 mg into the skin once a week.   empagliflozin 10 MG Tabs tablet Commonly known as:  Jardiance Take 10 mg by mouth daily.   fluticasone 50 MCG/ACT nasal spray Commonly known as:  FLONASE Place 2 sprays into both nostrils daily.   glucose blood test strip True Test Strips. Check blood sugars three times per day. E11.65.   levocetirizine 5 MG tablet Commonly known as:  Xyzal Take 1 tablet (5 mg total) by mouth every evening.   metFORMIN 750 MG  24 hr tablet Commonly known as:  GLUCOPHAGE-XR Take 1 tablet (750 mg total) by mouth 2 (two) times daily.   montelukast 10 MG tablet Commonly known as:  SINGULAIR Take 1 tablet (10 mg total) by mouth at bedtime.   omeprazole 40 MG capsule Commonly known as:  PRILOSEC Take 1 capsule (40 mg total) by mouth daily as needed. For acid reflux   tamsulosin 0.4 MG Caps capsule Commonly known as:  FLOMAX Take 2 capsules (0.8 mg total) by mouth daily.   traMADol 50 MG tablet Commonly known as:  ULTRAM Take 1 tablet (50 mg total) by mouth every 6 (six) hours as needed.   TRUEplus Lancets 33G Misc True Lancets. Check Blood Sugars Three times  Per Day. DX: E11.65          Objective:    BP 115/80   Pulse 85   Ht 6' 1" (1.854 m)   Wt 271 lb (122.9 kg)   BMI 35.75 kg/m   No Known Allergies  Wt Readings from Last 3 Encounters:  09/02/18 271 lb (122.9 kg)  06/11/18 264 lb (119.7 kg)  05/27/18 261 lb (118.4 kg)    Physical Exam Vitals signs and nursing note reviewed.  Constitutional:      General: He is not in acute distress.    Appearance: He is well-developed.  HENT:     Head: Normocephalic and atraumatic.  Eyes:     Conjunctiva/sclera: Conjunctivae normal.     Pupils: Pupils are equal, round, and reactive to light.  Cardiovascular:     Rate and Rhythm: Normal rate and regular rhythm.     Heart sounds: Normal heart sounds.  Pulmonary:     Effort: Pulmonary effort is normal. No respiratory distress.     Breath sounds: Normal breath sounds.  Skin:    General: Skin is warm and dry.  Psychiatric:        Behavior: Behavior normal.     Results for orders placed or performed in visit on 09/02/18  Bayer DCA Hb A1c Waived  Result Value Ref Range   HB A1C (BAYER DCA - WAIVED) 6.8 <7.0 %      Assessment & Plan:   1. Allergic rhinitis due to pollen, unspecified seasonality - fluticasone (FLONASE) 50 MCG/ACT nasal spray; Place 2 sprays into both nostrils daily.  Dispense:  48 g; Refill: 3 - levocetirizine (XYZAL) 5 MG tablet; Take 1 tablet (5 mg total) by mouth every evening.  Dispense: 90 tablet; Refill: 3  2. Uncontrolled type 2 diabetes mellitus with hyperglycemia (HCC) - Dulaglutide (TRULICITY) 6.14 ER/1.5QM SOPN; Inject 0.75 mg into the skin once a week.  Dispense: 12 pen; Refill: 3 - CMP14+EGFR - Lipid panel - Bayer DCA Hb A1c Waived - Microalbumin /  creatinine urine ratio - CMP14+EGFR; Standing - Lipid panel; Standing - Bayer DCA Hb A1c Waived; Standing - TSH; Standing - PSA; Standing - CBC with Differential; Standing  3. Dyslipidemia - CMP14+EGFR - Lipid panel - Bayer DCA Hb A1c Waived - Microalbumin / creatinine urine ratio - CMP14+EGFR; Standing - Lipid panel; Standing - Bayer DCA Hb A1c Waived; Standing  4. Benign prostatic hyperplasia with incomplete bladder emptying - PSA - PSA; Standing  5. Well adult exam - TSH; Standing - PSA; Standing - CBC with Differential; Standing   Continue all other maintenance medications as listed above.  Follow up plan: Return in about 4 months (around 01/02/2019) for recheck.  Educational handout given for [place] PA-C [facility] [address]  [place], [place] [address] [phone]   09/02/2018, 9:58 PM

## 2018-09-03 ENCOUNTER — Other Ambulatory Visit: Payer: Self-pay | Admitting: Physician Assistant

## 2018-09-03 ENCOUNTER — Encounter: Payer: Self-pay | Admitting: Physician Assistant

## 2018-09-03 DIAGNOSIS — E1165 Type 2 diabetes mellitus with hyperglycemia: Secondary | ICD-10-CM

## 2018-09-03 LAB — CMP14+EGFR
A/G RATIO: 1.7 (ref 1.2–2.2)
ALBUMIN: 4.5 g/dL (ref 3.8–4.9)
ALT: 52 IU/L — ABNORMAL HIGH (ref 0–44)
AST: 30 IU/L (ref 0–40)
Alkaline Phosphatase: 65 IU/L (ref 39–117)
BILIRUBIN TOTAL: 0.5 mg/dL (ref 0.0–1.2)
BUN / CREAT RATIO: 14 (ref 9–20)
BUN: 16 mg/dL (ref 6–24)
CHLORIDE: 102 mmol/L (ref 96–106)
CO2: 20 mmol/L (ref 20–29)
Calcium: 9.3 mg/dL (ref 8.7–10.2)
Creatinine, Ser: 1.13 mg/dL (ref 0.76–1.27)
GFR calc non Af Amer: 71 mL/min/{1.73_m2} (ref >59)
GFR, EST AFRICAN AMERICAN: 82 mL/min/{1.73_m2} (ref >59)
Globulin, Total: 2.7 g/dL (ref 1.5–4.5)
Glucose: 155 mg/dL — ABNORMAL HIGH (ref 65–99)
POTASSIUM: 4.9 mmol/L (ref 3.5–5.2)
Sodium: 139 mmol/L (ref 134–144)
TOTAL PROTEIN: 7.2 g/dL (ref 6.0–8.5)

## 2018-09-03 LAB — LIPID PANEL
Chol/HDL Ratio: 4.8 ratio (ref 0.0–5.0)
Cholesterol, Total: 189 mg/dL (ref 100–199)
HDL: 39 mg/dL — ABNORMAL LOW (ref >39)
LDL Calculated: 89 mg/dL (ref 0–99)
Triglycerides: 307 mg/dL — ABNORMAL HIGH (ref 0–149)
VLDL CHOLESTEROL CAL: 61 mg/dL — AB (ref 5–40)

## 2018-09-03 LAB — MICROALBUMIN / CREATININE URINE RATIO
CREATININE, UR: 172.9 mg/dL
MICROALB/CREAT RATIO: 7 mg/g{creat} (ref 0–29)
MICROALBUM., U, RANDOM: 12.2 ug/mL

## 2018-09-03 LAB — PSA: PROSTATE SPECIFIC AG, SERUM: 1.4 ng/mL (ref 0.0–4.0)

## 2018-09-03 MED ORDER — DULAGLUTIDE 1.5 MG/0.5ML ~~LOC~~ SOAJ: 1.5000 mg | SUBCUTANEOUS | 11 refills | Status: DC

## 2018-12-04 ENCOUNTER — Ambulatory Visit: Payer: No Typology Code available for payment source | Admitting: Physician Assistant

## 2019-01-22 ENCOUNTER — Encounter: Payer: Self-pay | Admitting: Physician Assistant

## 2019-01-23 ENCOUNTER — Other Ambulatory Visit: Payer: Self-pay | Admitting: Family

## 2019-01-23 MED ORDER — JARDIANCE 10 MG PO TABS: 10.0000 mg | ORAL_TABLET | Freq: Every day | ORAL | 1 refills | Status: DC

## 2019-01-24 ENCOUNTER — Telehealth: Payer: Self-pay | Admitting: Physician Assistant

## 2019-01-24 ENCOUNTER — Other Ambulatory Visit: Payer: Self-pay | Admitting: Physician Assistant

## 2019-01-24 MED ORDER — METFORMIN HCL 500 MG PO TABS: 500.0000 mg | ORAL_TABLET | Freq: Two times a day (BID) | ORAL | 3 refills | Status: DC

## 2019-01-24 NOTE — Telephone Encounter (Signed)
New script of immediate release has been sent Extend release was recalled Also restart Januvia Continue Trulicity

## 2019-01-24 NOTE — Telephone Encounter (Signed)
Patient aware.

## 2019-03-18 ENCOUNTER — Other Ambulatory Visit: Payer: Self-pay | Admitting: Family

## 2019-03-18 ENCOUNTER — Other Ambulatory Visit: Payer: Self-pay | Admitting: Physician Assistant

## 2019-03-18 NOTE — Telephone Encounter (Signed)
Dosage in chart is different then rx requested. Please advise

## 2019-03-19 ENCOUNTER — Ambulatory Visit (INDEPENDENT_AMBULATORY_CARE_PROVIDER_SITE_OTHER): Payer: Commercial Managed Care - PPO | Admitting: Physician Assistant

## 2019-03-19 DIAGNOSIS — E1165 Type 2 diabetes mellitus with hyperglycemia: Secondary | ICD-10-CM | POA: Diagnosis not present

## 2019-03-19 DIAGNOSIS — J301 Allergic rhinitis due to pollen: Secondary | ICD-10-CM | POA: Diagnosis not present

## 2019-03-19 DIAGNOSIS — R3914 Feeling of incomplete bladder emptying: Secondary | ICD-10-CM

## 2019-03-19 DIAGNOSIS — N401 Enlarged prostate with lower urinary tract symptoms: Secondary | ICD-10-CM | POA: Diagnosis not present

## 2019-03-19 DIAGNOSIS — E785 Hyperlipidemia, unspecified: Secondary | ICD-10-CM | POA: Diagnosis not present

## 2019-03-19 MED ORDER — FLUTICASONE PROPIONATE 50 MCG/ACT NA SUSP: 2.0000 | Freq: Every day | NASAL | 3 refills | Status: DC

## 2019-03-19 MED ORDER — METFORMIN HCL 500 MG PO TABS: 500.0000 mg | ORAL_TABLET | Freq: Two times a day (BID) | ORAL | 3 refills | Status: DC

## 2019-03-19 MED ORDER — OMEPRAZOLE 40 MG PO CPDR: 40.0000 mg | DELAYED_RELEASE_CAPSULE | Freq: Every day | ORAL | 1 refills | Status: DC | PRN

## 2019-03-19 MED ORDER — JARDIANCE 25 MG PO TABS: 25.0000 mg | ORAL_TABLET | Freq: Every day | ORAL | 3 refills | Status: DC

## 2019-03-19 MED ORDER — TAMSULOSIN HCL 0.4 MG PO CAPS: 0.8000 mg | ORAL_CAPSULE | Freq: Every day | ORAL | 3 refills | Status: DC

## 2019-03-19 MED ORDER — TRULICITY 1.5 MG/0.5ML ~~LOC~~ SOAJ: 1.5000 mg | SUBCUTANEOUS | 3 refills | Status: DC

## 2019-03-19 MED ORDER — MONTELUKAST SODIUM 10 MG PO TABS: 10.0000 mg | ORAL_TABLET | Freq: Every day | ORAL | 3 refills | Status: DC

## 2019-03-20 ENCOUNTER — Encounter: Payer: Self-pay | Admitting: Physician Assistant

## 2019-03-20 NOTE — Progress Notes (Signed)
Telephone visit  Subjective: CC: Recheck on chronic medical conditions PCP: Terald Sleeper, PA-C DTO:[name] [name] is a 58 y.o. male calls for telephone consult today. Patient provides verbal consent for consult held via phone.  Patient is identified with 2 separate identifiers.  At this time the entire area is on COVID-19 social distancing and stay home orders are in place.  Patient is of higher risk and therefore we are performing this by a virtual method.  Location of patient: Home Location of provider: HOME Others present for call: no  This patient is having.  Recheck on his chronic medical condition  Diabetes: He has had not very good control he admits he has not been doing very good with his food choices.  He wants to try to work on that.  He denies any lows happening whatsoever but he definitely has highs.  He is currently taking metformin, Jardiance, Trulicity  BPH: He has had good control with this medication.  He will continue with medications his PSA was normal when it was rechecked.  He reports that his allergies have been fairly well controlled and his medications are doing well he does need some refills.  He does have recurrent days of back and neck pain related to his severe degenerative disc disease and nerve impingement.   ROS: Per HPI  No Known Allergies Past Medical History:  Diagnosis Date  . Arthritis   . Asthma   . Blood in stool   . COPD (chronic obstructive pulmonary disease) ([place])   . Diabetes mellitus without complication ([place]) 0/99   type 2  . GERD (gastroesophageal reflux disease)   . Hay fever   . Hx of colonic polyps   . Migraines   . PONV (postoperative nausea and vomiting)    also history of motion sickness    Current Outpatient Medications:  .  albuterol (PROVENTIL HFA;VENTOLIN HFA) 108 (90 Base) MCG/ACT inhaler, Inhale 2 puffs into the lungs every 6 (six) hours as needed for wheezing or shortness of breath., Disp: 18 g, Rfl: 2  .  Azelastine-Fluticasone 137-50 MCG/ACT SUSP, Use 2 sprays into each nostril once per day., Disp: 23 g, Rfl: 2 .  beclomethasone (QVAR) 80 MCG/ACT inhaler, Inhale 2 puffs into the lungs 2 (two) times daily., Disp: , Rfl:  .  cyclobenzaprine (FLEXERIL) 5 MG tablet, Take one to two at night as needed., Disp: 30 tablet, Rfl: 1 .  Dulaglutide (TRULICITY) 1.5 IP/3.8SN SOPN, Inject 1.5 mg into the skin once a week., Disp: 12 pen, Rfl: 3 .  empagliflozin (JARDIANCE) 25 MG TABS tablet, Take 25 mg by mouth daily before breakfast., Disp: 90 tablet, Rfl: 3 .  fluticasone (FLONASE) 50 MCG/ACT nasal spray, Place 2 sprays into both nostrils daily., Disp: 48 g, Rfl: 3 .  glucose blood test strip, True Test Strips. Check blood sugars three times per day. E11.65., Disp: 300 each, Rfl: 3 .  levocetirizine (XYZAL) 5 MG tablet, Take 1 tablet (5 mg total) by mouth every evening., Disp: 90 tablet, Rfl: 3 .  metFORMIN (GLUCOPHAGE) 500 MG tablet, Take 1 tablet (500 mg total) by mouth 2 (two) times daily with a meal., Disp: 180 tablet, Rfl: 3 .  montelukast (SINGULAIR) 10 MG tablet, Take 1 tablet (10 mg total) by mouth at bedtime., Disp: 90 tablet, Rfl: 3 .  naproxen sodium (ALEVE) 220 MG tablet, Take 220 mg by mouth 2 (two) times daily with a meal., Disp: , Rfl:  .  omeprazole (PRILOSEC)  40 MG capsule, Take 1 capsule (40 mg total) by mouth daily as needed. For acid reflux, Disp: 90 capsule, Rfl: 1 .  tamsulosin (FLOMAX) 0.4 MG CAPS capsule, Take 2 capsules (0.8 mg total) by mouth daily., Disp: 180 capsule, Rfl: 3 .  traMADol (ULTRAM) 50 MG tablet, Take 1 tablet (50 mg total) by mouth every 6 (six) hours as needed., Disp: 60 tablet, Rfl: 1 .  TRUEPLUS LANCETS 33G MISC, True Lancets. Check Blood Sugars Three times  Per Day. DX: E11.65, Disp: 300 each, Rfl: 3  Assessment/ Plan: 58 y.o. male   1. Allergic rhinitis due to pollen, unspecified seasonality - montelukast (SINGULAIR) 10 MG tablet; Take 1 tablet (10 mg total) by  mouth at bedtime.  Dispense: 90 tablet; Refill: 3 - fluticasone (FLONASE) 50 MCG/ACT nasal spray; Place 2 sprays into both nostrils daily.  Dispense: 48 g; Refill: 3  2. Benign prostatic hyperplasia with incomplete bladder emptying - tamsulosin (FLOMAX) 0.4 MG CAPS capsule; Take 2 capsules (0.8 mg total) by mouth daily.  Dispense: 180 capsule; Refill: 3  3. Uncontrolled type 2 diabetes mellitus with hyperglycemia (HCC) - metFORMIN (GLUCOPHAGE) 500 MG tablet; Take 1 tablet (500 mg total) by mouth 2 (two) times daily with a meal.  Dispense: 180 tablet; Refill: 3 - Dulaglutide (TRULICITY) 1.5 AV/4.0JW SOPN; Inject 1.5 mg into the skin once a week.  Dispense: 12 pen; Refill: 3 - empagliflozin (JARDIANCE) 25 MG TABS tablet; Take 25 mg by mouth daily before breakfast.  Dispense: 90 tablet; Refill: 3 - CBC with Differential/Platelet; Future - CMP14+EGFR; Future - Lipid panel; Future - Bayer DCA Hb A1c Waived; Future  4. Dyslipidemia - CBC with Differential/Platelet; Future - CMP14+EGFR; Future - Lipid panel; Future   No follow-ups on file.  Continue all other maintenance medications as listed above.  Start time: 9:02 AM End time: 9:20 AM  Meds ordered this encounter  Medications  . montelukast (SINGULAIR) 10 MG tablet    Sig: Take 1 tablet (10 mg total) by mouth at bedtime.    Dispense:  90 tablet    Refill:  3    Generic [id number] '10MG'$  (TAB) Generic ZHY:QMVHQIONG '10MG'$  (TAB)    Order Specific Question:   Supervising Provider    Answer:   Janora Norlander [[id number]]  . tamsulosin (FLOMAX) 0.4 MG CAPS capsule    Sig: Take 2 capsules (0.8 mg total) by mouth daily.    Dispense:  180 capsule    Refill:  3    CAN WE GET SCRIPT FOR QTY 180/90 DAYS PLEASE    Order Specific Question:   Supervising Provider    Answer:   Janora Norlander [[id number]]  . metFORMIN (GLUCOPHAGE) 500 MG tablet    Sig: Take 1 tablet (500 mg total) by mouth 2 (two) times daily with a meal.    Dispense:   180 tablet    Refill:  3    Order Specific Question:   Supervising Provider    Answer:   Janora Norlander [[id number]]  . omeprazole (PRILOSEC) 40 MG capsule    Sig: Take 1 capsule (40 mg total) by mouth daily as needed. For acid reflux    Dispense:  90 capsule    Refill:  1    Order Specific Question:   Supervising Provider    Answer:   Janora Norlander [[id number]]  . Dulaglutide (TRULICITY) 1.5 QV/9.5GL SOPN    Sig: Inject 1.5 mg into the skin once a week.    Dispense:  12 pen    Refill:  3    Order Specific Question:   Supervising Provider    Answer:   Janora Norlander [[id number]]  . fluticasone (FLONASE) 50 MCG/ACT nasal spray    Sig: Place 2 sprays into both nostrils daily.    Dispense:  48 g    Refill:  3    Order Specific Question:   Supervising Provider    Answer:   Janora Norlander [[id number]]  . empagliflozin (JARDIANCE) 25 MG TABS tablet    Sig: Take 25 mg by mouth daily before breakfast.    Dispense:  90 tablet    Refill:  3    Order Specific Question:   Supervising Provider    Answer:   Janora Norlander [[id number]]    Particia Nearing PA-C [place] [phone]

## 2019-03-21 ENCOUNTER — Other Ambulatory Visit: Payer: Self-pay | Admitting: Physician Assistant

## 2019-03-21 NOTE — Telephone Encounter (Signed)
He is on regular, not XR

## 2019-04-02 DIAGNOSIS — M47816 Spondylosis without myelopathy or radiculopathy, lumbar region: Secondary | ICD-10-CM | POA: Insufficient documentation

## 2019-10-28 ENCOUNTER — Other Ambulatory Visit: Payer: Self-pay

## 2019-10-28 ENCOUNTER — Ambulatory Visit: Payer: Commercial Managed Care - PPO | Admitting: Nurse Practitioner

## 2019-10-28 ENCOUNTER — Encounter: Payer: Self-pay | Admitting: Nurse Practitioner

## 2019-10-28 VITALS — BP 116/77 | HR 88 | Temp 98.0°F | Resp 20 | Ht 73.0 in | Wt 269.0 lb

## 2019-10-28 DIAGNOSIS — J301 Allergic rhinitis due to pollen: Secondary | ICD-10-CM

## 2019-10-28 DIAGNOSIS — E785 Hyperlipidemia, unspecified: Secondary | ICD-10-CM

## 2019-10-28 DIAGNOSIS — N401 Enlarged prostate with lower urinary tract symptoms: Secondary | ICD-10-CM

## 2019-10-28 DIAGNOSIS — E1165 Type 2 diabetes mellitus with hyperglycemia: Secondary | ICD-10-CM

## 2019-10-28 DIAGNOSIS — R3914 Feeling of incomplete bladder emptying: Secondary | ICD-10-CM

## 2019-10-28 LAB — CMP14+EGFR
ALT: 33 IU/L (ref 0–44)
AST: 19 IU/L (ref 0–40)
Albumin/Globulin Ratio: 2 (ref 1.2–2.2)
Albumin: 4.7 g/dL (ref 3.8–4.9)
Alkaline Phosphatase: 70 IU/L (ref 39–117)
BUN/Creatinine Ratio: 13 (ref 10–24)
BUN: 14 mg/dL (ref 8–27)
Bilirubin Total: 0.6 mg/dL (ref 0.0–1.2)
CO2: 22 mmol/L (ref 20–29)
Calcium: 9.4 mg/dL (ref 8.6–10.2)
Chloride: 102 mmol/L (ref 96–106)
Creatinine, Ser: 1.06 mg/dL (ref 0.76–1.27)
GFR calc Af Amer: 88 mL/min/{1.73_m2} (ref >59)
GFR calc non Af Amer: 76 mL/min/{1.73_m2} (ref >59)
Globulin, Total: 2.3 g/dL (ref 1.5–4.5)
Glucose: 130 mg/dL — ABNORMAL HIGH (ref 65–99)
Potassium: 4.5 mmol/L (ref 3.5–5.2)
Sodium: 139 mmol/L (ref 134–144)
Total Protein: 7 g/dL (ref 6.0–8.5)

## 2019-10-28 LAB — LIPID PANEL
Chol/HDL Ratio: 4.5 ratio (ref 0.0–5.0)
Cholesterol, Total: 178 mg/dL (ref 100–199)
HDL: 40 mg/dL (ref >39)
LDL Chol Calc (NIH): 99 mg/dL (ref 0–99)
Triglycerides: 227 mg/dL — ABNORMAL HIGH (ref 0–149)
VLDL Cholesterol Cal: 39 mg/dL (ref 5–40)

## 2019-10-28 LAB — BAYER DCA HB A1C WAIVED: HB A1C (BAYER DCA - WAIVED): 7.1 % — ABNORMAL HIGH (ref <7.0)

## 2019-10-28 MED ORDER — FLUTICASONE PROPIONATE 50 MCG/ACT NA SUSP: 2.0000 | Freq: Every day | NASAL | 1 refills | Status: DC

## 2019-10-28 MED ORDER — JARDIANCE 25 MG PO TABS: 25.0000 mg | ORAL_TABLET | Freq: Every day | ORAL | 1 refills | Status: DC

## 2019-10-28 MED ORDER — METFORMIN HCL 500 MG PO TABS: 500.0000 mg | ORAL_TABLET | Freq: Two times a day (BID) | ORAL | 1 refills | Status: DC

## 2019-10-28 MED ORDER — LEVOCETIRIZINE DIHYDROCHLORIDE 5 MG PO TABS: 5.0000 mg | ORAL_TABLET | Freq: Every evening | ORAL | 1 refills | Status: DC

## 2019-10-28 MED ORDER — TRULICITY 1.5 MG/0.5ML ~~LOC~~ SOAJ: 1.5000 mg | SUBCUTANEOUS | 1 refills | Status: DC

## 2019-10-28 MED ORDER — MONTELUKAST SODIUM 10 MG PO TABS: 10.0000 mg | ORAL_TABLET | Freq: Every day | ORAL | 1 refills | Status: DC

## 2019-10-28 MED ORDER — TAMSULOSIN HCL 0.4 MG PO CAPS: 0.8000 mg | ORAL_CAPSULE | Freq: Every day | ORAL | 1 refills | Status: DC

## 2019-10-28 NOTE — Assessment & Plan Note (Signed)
Patients allergies are well controlled on current medication no changes to medication. Rx sent to pharmacy.

## 2019-10-28 NOTE — Assessment & Plan Note (Signed)
Patient is well managed on current medication no changes to current dose. Medication refill sent to pharmacy. Patient will follow up on next appointment in 3 months.

## 2019-10-28 NOTE — Assessment & Plan Note (Addendum)
Patient's diabetes is well controlled on current regimen no changes to current doses, patient has not been in clinic for one year and has not had blood work or macroalbumin checked. He is scheduled for a complete  Physical in 3 months. Labs collected today. Provided diabetes education and complete foot exam. Medication refill sent to pharmacy. Patient will continue to work on diet and exercise.

## 2019-10-28 NOTE — Progress Notes (Signed)
Established Patient Office Visit  Subjective:  Patient ID: [name] [name], male    DOB: 01/13/1961  Age: 59 y.o. MRN: [id number]  CC:  Chief Complaint  Patient presents with  . Medical Management of Chronic Issues  . Hyperlipidemia  . Diabetes    HPI [name] [name] presents for diabetes (DM2) and  Hyperlipidemia. Patient has not been seen by a PCP in the last one year, He did report that his diabetes has been stables and blood sugars range from 140-150 when checked at home, He has been compliant with medication and has not had any side effects from medication or signs and symptoms of hyper or hypo glycemia. Patient says he is able to tell when his blood sugars are low and knows when to come to clinic. No changes to diabetic neuropathy. Patient says he checks his feet daily at home. Concerning his hyperlipidemia there is no way to measure cholesterol levels however patient says he has been compliant with his medication and will schedule  appointment with his eye doctor next week.  Past Medical History:  Diagnosis Date  . Arthritis   . Asthma   . Blood in stool   . COPD (chronic obstructive pulmonary disease) ([place])   . Diabetes mellitus without complication ([place]) 4/48   type 2  . GERD (gastroesophageal reflux disease)   . Hay fever   . Hx of colonic polyps   . Migraines   . PONV (postoperative nausea and vomiting)    also history of motion sickness    Past Surgical History:  Procedure Laterality Date  . ANTERIOR CERVICAL DECOMP/DISCECTOMY FUSION  02/20/2012   Procedure: ANTERIOR CERVICAL DECOMPRESSION/DISCECTOMY FUSION 1 LEVEL;  Surgeon: Erline Levine, MD;  Location: [facility];  Service: Neurosurgery;  Laterality: N/A;  Cervical six - seven  Anterior cervical decompression/diskectomy/fusion  . APPENDECTOMY  1984  . KNEE ARTHROSCOPY     right  . NASAL SEPTUM SURGERY  2008    Family History  Problem Relation Age of Onset  . Cancer Maternal Uncle        colon  .  Cancer Maternal Uncle   . Cancer Maternal Uncle   . Cancer Maternal Uncle   . Cancer Maternal Uncle   . Cancer Maternal Uncle   . Cancer Mother        colon, breast   . Diabetes Mother   . Diabetes Father        type II  . Cancer Maternal Grandmother        breast, colon   . Diabetes Brother   . Diabetes Paternal Grandfather     Social History   Socioeconomic History  . Marital status: Married    Spouse name: Not on file  . Number of children: Not on file  . Years of education: Not on file  . Highest education level: Not on file  Occupational History  . Not on file  Tobacco Use  . Smoking status: Former Research scientist (life sciences)  . Smokeless tobacco: Never Used  Substance and Sexual Activity  . Alcohol use: Yes    Comment: occasional  . Drug use: No  . Sexual activity: Not on file  Other Topics Concern  . Not on file  Social History Narrative  . Not on file   Social Determinants of Health   Financial Resource Strain:   . Difficulty of Paying Living Expenses:   Food Insecurity:   . Worried About Charity fundraiser in the Last Year:   .  Ran Out of Food in the Last Year:   Transportation Needs:   . Film/video editor (Medical):   Marland Kitchen Lack of Transportation (Non-Medical):   Physical Activity:   . Days of Exercise per Week:   . Minutes of Exercise per Session:   Stress:   . Feeling of Stress :   Social Connections:   . Frequency of Communication with Friends and Family:   . Frequency of Social Gatherings with Friends and Family:   . Attends Religious Services:   . Active Member of Clubs or Organizations:   . Attends Archivist Meetings:   Marland Kitchen Marital Status:   Intimate Partner Violence:   . Fear of Current or Ex-Partner:   . Emotionally Abused:   Marland Kitchen Physically Abused:   . Sexually Abused:     Outpatient Medications Prior to Visit  Medication Sig Dispense Refill  . albuterol (PROVENTIL HFA;VENTOLIN HFA) 108 (90 Base) MCG/ACT inhaler Inhale 2 puffs into the lungs  every 6 (six) hours as needed for wheezing or shortness of breath. 18 g 2  . glucose blood test strip True Test Strips. Check blood sugars three times per day. E11.65. 300 each 3  . naproxen sodium (ALEVE) 220 MG tablet Take 220 mg by mouth 2 (two) times daily with a meal.    . omeprazole (PRILOSEC) 40 MG capsule Take 1 capsule (40 mg total) by mouth daily as needed. For acid reflux 90 capsule 1  . TRUEPLUS LANCETS 33G MISC True Lancets. Check Blood Sugars Three times  Per Day. DX: E11.65 300 each 3  . Dulaglutide (TRULICITY) 1.5 XT/0.5WP SOPN Inject 1.5 mg into the skin once a week. 12 pen 3  . empagliflozin (JARDIANCE) 25 MG TABS tablet Take 25 mg by mouth daily before breakfast. 90 tablet 3  . fluticasone (FLONASE) 50 MCG/ACT nasal spray Place 2 sprays into both nostrils daily. 48 g 3  . levocetirizine (XYZAL) 5 MG tablet Take 1 tablet (5 mg total) by mouth every evening. 90 tablet 3  . metFORMIN (GLUCOPHAGE) 500 MG tablet Take 1 tablet (500 mg total) by mouth 2 (two) times daily with a meal. 180 tablet 3  . montelukast (SINGULAIR) 10 MG tablet Take 1 tablet (10 mg total) by mouth at bedtime. 90 tablet 3  . tamsulosin (FLOMAX) 0.4 MG CAPS capsule Take 2 capsules (0.8 mg total) by mouth daily. 180 capsule 3  . beclomethasone (QVAR) 80 MCG/ACT inhaler Inhale 2 puffs into the lungs 2 (two) times daily.    . cyclobenzaprine (FLEXERIL) 5 MG tablet Take one to two at night as needed. (Patient not taking: Reported on 10/28/2019) 30 tablet 1  . traMADol (ULTRAM) 50 MG tablet Take 1 tablet (50 mg total) by mouth every 6 (six) hours as needed. (Patient not taking: Reported on 10/28/2019) 60 tablet 1  . Azelastine-Fluticasone 137-50 MCG/ACT SUSP Use 2 sprays into each nostril once per day. 23 g 2   No facility-administered medications prior to visit.    Allergies  Allergen Reactions  . Other Swelling    hayfever    ROS Review of Systems  Constitutional: Negative for activity change, appetite change  and fatigue.  Eyes: Negative for redness.  Respiratory: Negative.   Cardiovascular: Negative.   Endocrine: Negative for polydipsia, polyphagia and polyuria.  Genitourinary: Negative for decreased urine volume and difficulty urinating.  Musculoskeletal: Negative for arthralgias and myalgias.  Skin: Negative for color change.  Neurological: Negative.       Objective:  Physical Exam  Constitutional: He is oriented to person, place, and time. He appears well-developed and well-nourished.  HENT:  Head: Normocephalic.  Mouth/Throat: Oropharynx is clear and moist.  Eyes: Conjunctivae are normal.  Cardiovascular: Normal rate and regular rhythm.  Pulmonary/Chest: Effort normal and breath sounds normal.  Abdominal: Soft. Bowel sounds are normal.  Musculoskeletal:     Cervical back: Neck supple.  Neurological: He is alert and oriented to person, place, and time.  Skin: Skin is warm. No rash noted.  Psychiatric: He has a normal mood and affect.    BP 116/77   Pulse 88   Temp 98 F (36.7 C)   Resp 20   Ht 6' 1"  (1.854 m)   Wt 269 lb (122 kg)   SpO2 95%   BMI 35.49 kg/m  Wt Readings from Last 3 Encounters:  10/28/19 269 lb (122 kg)  09/02/18 271 lb (122.9 kg)  06/11/18 264 lb (119.7 kg)     Health Maintenance Due  Topic Date Due  . OPHTHALMOLOGY EXAM  Never done  . COVID-19 Vaccine (1) Never done  . FOOT EXAM  05/22/2018  . HEMOGLOBIN A1C  03/05/2019  . URINE MICROALBUMIN  09/02/2019    There are no preventive care reminders to display for this patient.  Lab Results  Component Value Date   TSH 0.793 05/22/2017   Lab Results  Component Value Date   WBC 5.7 06/11/2018   HGB 17.3 06/11/2018   HCT 51.8 (H) 06/11/2018   MCV 88 06/11/2018   PLT 205 06/11/2018   Lab Results  Component Value Date   NA 139 09/02/2018   K 4.9 09/02/2018   CO2 20 09/02/2018   GLUCOSE 155 (H) 09/02/2018   BUN 16 09/02/2018   CREATININE 1.13 09/02/2018   BILITOT 0.5 09/02/2018    ALKPHOS 65 09/02/2018   AST 30 09/02/2018   ALT 52 (H) 09/02/2018   PROT 7.2 09/02/2018   ALBUMIN 4.5 09/02/2018   CALCIUM 9.3 09/02/2018   GFR 77.49 05/04/2016   Lab Results  Component Value Date   CHOL 189 09/02/2018   Lab Results  Component Value Date   HDL 39 (L) 09/02/2018   Lab Results  Component Value Date   LDLCALC 89 09/02/2018   Lab Results  Component Value Date   TRIG 307 (H) 09/02/2018   Lab Results  Component Value Date   CHOLHDL 4.8 09/02/2018   Lab Results  Component Value Date   HGBA1C 6.8 09/02/2018      Assessment & Plan:   Problem List Items Addressed This Visit      Respiratory   Allergic rhinitis    Patients allergies are well controlled on current medication no changes to medication. Rx sent to pharmacy.         Endocrine   Diabetes type 2, uncontrolled ([place]) - Primary    Patient's diabetes is well controlled on current regimen no changes to current doses, patient has not been in clinic for one year and has not had blood work or macroalbumin checked. He is scheduled for a complete  Physical in 3 months. Labs collected today. Provided diabetes education and complete foot exam. Medication refill sent to pharmacy. Patient will continue to work on diet and exercise.      Relevant Orders   CMP14+EGFR   Bayer DCA Hb A1c Waived   Microalbumin / creatinine urine ratio     Genitourinary   Benign prostatic hyperplasia with incomplete bladder emptying  Patient is well managed on current medication no changes to current dose. Medication refill sent to pharmacy. Patient will follow up on next appointment in 3 months.        Other   Dyslipidemia    No changes to current medication, no labs drawn in the last year, patients medication is refilled on current dose pending lab results. Medication refill sent to [facility]      Relevant Orders   Lipid panel      Meds ordered this encounter  Medications  . montelukast (SINGULAIR) 10 MG tablet      Sig: Take 1 tablet (10 mg total) by mouth at bedtime.    Dispense:  90 tablet    Refill:  1    Generic [id number] 10MG (TAB) Generic [id number] 10MG (TAB)  . levocetirizine (XYZAL) 5 MG tablet    Sig: Take 1 tablet (5 mg total) by mouth every evening.    Dispense:  90 tablet    Refill:  1  . fluticasone (FLONASE) 50 MCG/ACT nasal spray    Sig: Place 2 sprays into both nostrils daily.    Dispense:  48 g    Refill:  1  . tamsulosin (FLOMAX) 0.4 MG CAPS capsule    Sig: Take 2 capsules (0.8 mg total) by mouth daily.    Dispense:  180 capsule    Refill:  1    CAN WE GET SCRIPT FOR QTY 180/90 DAYS PLEASE  . metFORMIN (GLUCOPHAGE) 500 MG tablet    Sig: Take 1 tablet (500 mg total) by mouth 2 (two) times daily with a meal.    Dispense:  180 tablet    Refill:  1  . empagliflozin (JARDIANCE) 25 MG TABS tablet    Sig: Take 25 mg by mouth daily before breakfast.    Dispense:  90 tablet    Refill:  1  . Dulaglutide (TRULICITY) 1.5 YS/1.6OH SOPN    Sig: Inject 1.5 mg into the skin once a week.    Dispense:  12 pen    Refill:  1    Follow-up: Return in about 3 months (around 01/28/2020) for Physical.    Ivy Lynn, NP

## 2019-10-28 NOTE — Patient Instructions (Addendum)
Patient's diabetes is well controlled on current regimen no changes to current doses, patient has not been in clinic for one year and has not had blood work or macroalbumin checked. He is scheduled for a complete  Physical in 3 months. Labs collected today. Provided diabetes education and complete foot exam.   Diabetes Basics  Diabetes (diabetes mellitus) is a long-term (chronic) disease. It occurs when the body does not properly use sugar (glucose) that is released from food after you eat. Diabetes may be caused by one or both of these problems:  Your pancreas does not make enough of a hormone called insulin.  Your body does not react in a normal way to insulin that it makes. Insulin lets sugars (glucose) go into cells in your body. This gives you energy. If you have diabetes, sugars cannot get into cells. This causes high blood sugar (hyperglycemia). Follow these instructions at home: How is diabetes treated? You may need to take insulin or other diabetes medicines daily to keep your blood sugar in balance. Take your diabetes medicines every day as told by your doctor. List your diabetes medicines here: Diabetes medicines  Name of medicine: ______________________________ ? Amount (dose): _______________ Time (a.m./p.m.): _______________ Notes: ___________________________________  Name of medicine: ______________________________ ? Amount (dose): _______________ Time (a.m./p.m.): _______________ Notes: ___________________________________  Name of medicine: ______________________________ ? Amount (dose): _______________ Time (a.m./p.m.): _______________ Notes: ___________________________________ If you use insulin, you will learn how to give yourself insulin by injection. You may need to adjust the amount based on the food that you eat. List the types of insulin you use here: Insulin  Insulin type: ______________________________ ? Amount (dose): _______________ Time (a.m./p.m.):  _______________ Notes: ___________________________________  Insulin type: ______________________________ ? Amount (dose): _______________ Time (a.m./p.m.): _______________ Notes: ___________________________________  Insulin type: ______________________________ ? Amount (dose): _______________ Time (a.m./p.m.): _______________ Notes: ___________________________________  Insulin type: ______________________________ ? Amount (dose): _______________ Time (a.m./p.m.): _______________ Notes: ___________________________________  Insulin type: ______________________________ ? Amount (dose): _______________ Time (a.m./p.m.): _______________ Notes: ___________________________________ How do I manage my blood sugar?  Check your blood sugar levels using a blood glucose monitor as directed by your doctor. Your doctor will set treatment goals for you. Generally, you should have these blood sugar levels:  Before meals (preprandial): 80-130 mg/dL (4.4-7.2 mmol/L).  After meals (postprandial): below 180 mg/dL (10 mmol/L).  A1c level: less than 7%. Write down the times that you will check your blood sugar levels: Blood sugar checks  Time: _______________ Notes: ___________________________________  Time: _______________ Notes: ___________________________________  Time: _______________ Notes: ___________________________________  Time: _______________ Notes: ___________________________________  Time: _______________ Notes: ___________________________________  Time: _______________ Notes: ___________________________________  What do I need to know about low blood sugar? Low blood sugar is called hypoglycemia. This is when blood sugar is at or below 70 mg/dL (3.9 mmol/L). Symptoms may include:  Feeling: ? Hungry. ? Worried or nervous (anxious). ? Sweaty and clammy. ? Confused. ? Dizzy. ? Sleepy. ? Sick to your stomach (nauseous).  Having: ? A fast heartbeat. ? A headache. ? A change in  your vision. ? Tingling or no feeling (numbness) around the mouth, lips, or tongue. ? Jerky movements that you cannot control (seizure).  Having trouble with: ? Moving (coordination). ? Sleeping. ? Passing out (fainting). ? Getting upset easily (irritability). Treating low blood sugar To treat low blood sugar, eat or drink something sugary right away. If you can think clearly and swallow safely, follow the 15:15 rule:  Take 15 grams of a fast-acting carb (carbohydrate). Talk with your  doctor about how much you should take.  Some fast-acting carbs are: ? Sugar tablets (glucose pills). Take 3-4 glucose pills. ? 6-8 pieces of hard candy. ? 4-6 oz (120-150 mL) of fruit juice. ? 4-6 oz (120-150 mL) of regular (not diet) soda. ? 1 Tbsp (15 mL) honey or sugar.  Check your blood sugar 15 minutes after you take the carb.  If your blood sugar is still at or below 70 mg/dL (3.9 mmol/L), take 15 grams of a carb again.  If your blood sugar does not go above 70 mg/dL (3.9 mmol/L) after 3 tries, get help right away.  After your blood sugar goes back to normal, eat a meal or a snack within 1 hour. Treating very low blood sugar If your blood sugar is at or below 54 mg/dL (3 mmol/L), you have very low blood sugar (severe hypoglycemia). This is an emergency. Do not wait to see if the symptoms will go away. Get medical help right away. Call your local emergency services (911 in the [place] Do not drive yourself to the hospital. Questions to ask your health care provider  Do I need to meet with a diabetes educator?  What equipment will I need to care for myself at home?  What diabetes medicines do I need? When should I take them?  How often do I need to check my blood sugar?  What number can I call if I have questions?  When is my next doctor's visit?  Where can I find a support group for people with diabetes? Where to find more information  American Diabetes Association:  www.diabetes.org  American Association of Diabetes Educators: www.diabeteseducator.org/patient-resources Contact a doctor if:  Your blood sugar is at or above 240 mg/dL (13.3 mmol/L) for 2 days in a row.  You have been sick or have had a fever for 2 days or more, and you are not getting better.  You have any of these problems for more than 6 hours: ? You cannot eat or drink. ? You feel sick to your stomach (nauseous). ? You throw up (vomit). ? You have watery poop (diarrhea). Get help right away if:  Your blood sugar is lower than 54 mg/dL (3 mmol/L).  You get confused.  You have trouble: ? Thinking clearly. ? Breathing. Summary  Diabetes (diabetes mellitus) is a long-term (chronic) disease. It occurs when the body does not properly use sugar (glucose) that is released from food after digestion.  Take insulin and diabetes medicines as told.  Check your blood sugar every day, as often as told.  Keep all follow-up visits as told by your doctor. This is important. This information is not intended to replace advice given to you by your health care provider. Make sure you discuss any questions you have with your health care provider. Document Revised: 03/05/2019 Document Reviewed: 09/14/2017 Elsevier Patient Education  [place].

## 2019-10-28 NOTE — Assessment & Plan Note (Signed)
No changes to current medication, no labs drawn in the last year, patients medication is refilled on current dose pending lab results. Medication refill sent to [facility]

## 2019-10-29 LAB — MICROALBUMIN / CREATININE URINE RATIO
Creatinine, Urine: 152.9 mg/dL
Microalb/Creat Ratio: 7 mg/g creat (ref 0–29)
Microalbumin, Urine: 10.8 ug/mL

## 2020-02-02 ENCOUNTER — Encounter: Payer: Commercial Managed Care - PPO | Admitting: Family

## 2020-02-03 ENCOUNTER — Encounter: Payer: Commercial Managed Care - PPO | Admitting: Family

## 2020-02-06 ENCOUNTER — Ambulatory Visit (INDEPENDENT_AMBULATORY_CARE_PROVIDER_SITE_OTHER): Payer: Commercial Managed Care - PPO | Admitting: Family

## 2020-02-06 ENCOUNTER — Encounter: Payer: Self-pay | Admitting: Family

## 2020-02-06 ENCOUNTER — Other Ambulatory Visit: Payer: Self-pay

## 2020-02-06 VITALS — BP 129/86 | HR 89 | Temp 97.6°F | Ht 73.0 in | Wt 266.0 lb

## 2020-02-06 DIAGNOSIS — N401 Enlarged prostate with lower urinary tract symptoms: Secondary | ICD-10-CM

## 2020-02-06 DIAGNOSIS — M5136 Other intervertebral disc degeneration, lumbar region: Secondary | ICD-10-CM

## 2020-02-06 DIAGNOSIS — G8929 Other chronic pain: Secondary | ICD-10-CM

## 2020-02-06 DIAGNOSIS — E785 Hyperlipidemia, unspecified: Secondary | ICD-10-CM

## 2020-02-06 DIAGNOSIS — Z6836 Body mass index (BMI) 36.0-36.9, adult: Secondary | ICD-10-CM

## 2020-02-06 DIAGNOSIS — Z5329 Procedure and treatment not carried out because of patient's decision for other reasons: Secondary | ICD-10-CM | POA: Insufficient documentation

## 2020-02-06 DIAGNOSIS — Z0000 Encounter for general adult medical examination without abnormal findings: Secondary | ICD-10-CM

## 2020-02-06 DIAGNOSIS — J4520 Mild intermittent asthma, uncomplicated: Secondary | ICD-10-CM

## 2020-02-06 DIAGNOSIS — Z5320 Procedure and treatment not carried out because of patient's decision for unspecified reasons: Secondary | ICD-10-CM | POA: Insufficient documentation

## 2020-02-06 DIAGNOSIS — E1165 Type 2 diabetes mellitus with hyperglycemia: Secondary | ICD-10-CM

## 2020-02-06 DIAGNOSIS — M545 Low back pain, unspecified: Secondary | ICD-10-CM

## 2020-02-06 DIAGNOSIS — K219 Gastro-esophageal reflux disease without esophagitis: Secondary | ICD-10-CM

## 2020-02-06 LAB — BAYER DCA HB A1C WAIVED: HB A1C (BAYER DCA - WAIVED): 7.1 % — ABNORMAL HIGH (ref <7.0)

## 2020-02-06 MED ORDER — LISINOPRIL 10 MG PO TABS: 10.0000 mg | ORAL_TABLET | Freq: Every day | ORAL | 3 refills | Status: DC

## 2020-02-06 NOTE — Patient Instructions (Signed)
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
## 2020-02-07 LAB — BMP8+EGFR
BUN/Creatinine Ratio: 15 (ref 10–24)
BUN: 15 mg/dL (ref 8–27)
CO2: 20 mmol/L (ref 20–29)
Calcium: 9.9 mg/dL (ref 8.6–10.2)
Chloride: 102 mmol/L (ref 96–106)
Creatinine, Ser: 0.99 mg/dL (ref 0.76–1.27)
GFR calc Af Amer: 95 mL/min/{1.73_m2} (ref >59)
GFR calc non Af Amer: 82 mL/min/{1.73_m2} (ref >59)
Glucose: 130 mg/dL — ABNORMAL HIGH (ref 65–99)
Potassium: 5 mmol/L (ref 3.5–5.2)
Sodium: 138 mmol/L (ref 134–144)

## 2020-02-07 LAB — PSA, TOTAL AND FREE
PSA, Free Pct: 22.4 %
PSA, Free: 0.38 ng/mL
Prostate Specific Ag, Serum: 1.7 ng/mL (ref 0.0–4.0)

## 2020-02-07 LAB — CBC WITH DIFFERENTIAL/PLATELET
Basophils Absolute: 0.1 10*3/uL (ref 0.0–0.2)
Basos: 3 %
EOS (ABSOLUTE): 0.2 10*3/uL (ref 0.0–0.4)
Eos: 4 %
Hematocrit: 54.5 % — ABNORMAL HIGH (ref 37.5–51.0)
Hemoglobin: 18.5 g/dL — ABNORMAL HIGH (ref 13.0–17.7)
Immature Grans (Abs): 0 10*3/uL (ref 0.0–0.1)
Immature Granulocytes: 0 %
Lymphocytes Absolute: 1.4 10*3/uL (ref 0.7–3.1)
Lymphs: 26 %
MCH: 30 pg (ref 26.6–33.0)
MCHC: 33.9 g/dL (ref 31.5–35.7)
MCV: 89 fL (ref 79–97)
Monocytes Absolute: 0.4 10*3/uL (ref 0.1–0.9)
Monocytes: 8 %
Neutrophils Absolute: 3.3 10*3/uL (ref 1.4–7.0)
Neutrophils: 59 %
Platelets: 194 10*3/uL (ref 150–450)
RBC: 6.16 x10E6/uL — ABNORMAL HIGH (ref 4.14–5.80)
RDW: 13 % (ref 11.6–15.4)
WBC: 5.5 10*3/uL (ref 3.4–10.8)

## 2020-02-07 LAB — TSH: TSH: 0.54 u[IU]/mL (ref 0.450–4.500)

## 2020-04-20 ENCOUNTER — Other Ambulatory Visit: Payer: Self-pay | Admitting: *Deleted

## 2020-04-20 DIAGNOSIS — E1165 Type 2 diabetes mellitus with hyperglycemia: Secondary | ICD-10-CM

## 2020-04-20 MED ORDER — TRULICITY 1.5 MG/0.5ML ~~LOC~~ SOAJ: 1.5000 mg | SUBCUTANEOUS | 0 refills | Status: DC

## 2020-05-11 ENCOUNTER — Other Ambulatory Visit: Payer: Self-pay | Admitting: Family

## 2020-05-11 DIAGNOSIS — E1165 Type 2 diabetes mellitus with hyperglycemia: Secondary | ICD-10-CM

## 2020-06-03 ENCOUNTER — Other Ambulatory Visit: Payer: Self-pay

## 2020-06-03 DIAGNOSIS — E1165 Type 2 diabetes mellitus with hyperglycemia: Secondary | ICD-10-CM

## 2020-06-03 MED ORDER — TRULICITY 1.5 MG/0.5ML ~~LOC~~ SOAJ: 1.5000 mg | SUBCUTANEOUS | 2 refills | Status: DC

## 2020-08-09 ENCOUNTER — Ambulatory Visit: Payer: Commercial Managed Care - PPO | Admitting: Family

## 2020-08-09 ENCOUNTER — Other Ambulatory Visit: Payer: Self-pay

## 2020-08-09 ENCOUNTER — Encounter: Payer: Self-pay | Admitting: Family

## 2020-08-09 VITALS — BP 125/82 | HR 83 | Temp 97.3°F | Ht 73.0 in | Wt 264.6 lb

## 2020-08-09 DIAGNOSIS — J4520 Mild intermittent asthma, uncomplicated: Secondary | ICD-10-CM

## 2020-08-09 DIAGNOSIS — J301 Allergic rhinitis due to pollen: Secondary | ICD-10-CM | POA: Diagnosis not present

## 2020-08-09 DIAGNOSIS — M5136 Other intervertebral disc degeneration, lumbar region: Secondary | ICD-10-CM

## 2020-08-09 DIAGNOSIS — E785 Hyperlipidemia, unspecified: Secondary | ICD-10-CM

## 2020-08-09 DIAGNOSIS — G8929 Other chronic pain: Secondary | ICD-10-CM

## 2020-08-09 DIAGNOSIS — E8881 Metabolic syndrome: Secondary | ICD-10-CM

## 2020-08-09 DIAGNOSIS — E1165 Type 2 diabetes mellitus with hyperglycemia: Secondary | ICD-10-CM

## 2020-08-09 DIAGNOSIS — N401 Enlarged prostate with lower urinary tract symptoms: Secondary | ICD-10-CM

## 2020-08-09 DIAGNOSIS — K219 Gastro-esophageal reflux disease without esophagitis: Secondary | ICD-10-CM | POA: Diagnosis not present

## 2020-08-09 DIAGNOSIS — Z23 Encounter for immunization: Secondary | ICD-10-CM | POA: Diagnosis not present

## 2020-08-09 DIAGNOSIS — R3914 Feeling of incomplete bladder emptying: Secondary | ICD-10-CM

## 2020-08-09 DIAGNOSIS — M545 Low back pain, unspecified: Secondary | ICD-10-CM

## 2020-08-09 LAB — BAYER DCA HB A1C WAIVED: HB A1C (BAYER DCA - WAIVED): 6.8 % (ref <7.0)

## 2020-08-09 MED ORDER — TRUEPLUS LANCETS 33G MISC: 3 refills | Status: DC

## 2020-08-09 MED ORDER — TAMSULOSIN HCL 0.4 MG PO CAPS: 0.8000 mg | ORAL_CAPSULE | Freq: Every day | ORAL | 1 refills | Status: DC

## 2020-08-09 MED ORDER — OMEPRAZOLE 40 MG PO CPDR: 40.0000 mg | DELAYED_RELEASE_CAPSULE | Freq: Every day | ORAL | 1 refills | Status: DC | PRN

## 2020-08-09 MED ORDER — MONTELUKAST SODIUM 10 MG PO TABS: 10.0000 mg | ORAL_TABLET | Freq: Every day | ORAL | 1 refills | Status: DC

## 2020-08-09 MED ORDER — METFORMIN HCL 500 MG PO TABS: 500.0000 mg | ORAL_TABLET | Freq: Two times a day (BID) | ORAL | 1 refills | Status: DC

## 2020-08-09 MED ORDER — FLUTICASONE PROPIONATE 50 MCG/ACT NA SUSP: 2.0000 | Freq: Every day | NASAL | 1 refills | Status: DC

## 2020-08-09 MED ORDER — TRULICITY 1.5 MG/0.5ML ~~LOC~~ SOAJ: 1.5000 mg | SUBCUTANEOUS | 2 refills | Status: DC

## 2020-08-09 MED ORDER — ALBUTEROL SULFATE HFA 108 (90 BASE) MCG/ACT IN AERS
2.0000 | INHALATION_SPRAY | Freq: Four times a day (QID) | RESPIRATORY_TRACT | 2 refills | Status: DC | PRN
Start: 2020-08-09 — End: 2022-10-03

## 2020-08-09 MED ORDER — BECLOMETHASONE DIPROPIONATE 80 MCG/ACT IN AERS: 2.0000 | INHALATION_SPRAY | Freq: Two times a day (BID) | RESPIRATORY_TRACT | 6 refills | Status: DC

## 2020-08-09 MED ORDER — EMPAGLIFLOZIN 25 MG PO TABS: 25.0000 mg | ORAL_TABLET | Freq: Every day | ORAL | 1 refills | Status: DC

## 2020-08-09 MED ORDER — LEVOCETIRIZINE DIHYDROCHLORIDE 5 MG PO TABS: 5.0000 mg | ORAL_TABLET | Freq: Every evening | ORAL | 1 refills | Status: DC

## 2020-08-09 MED ORDER — LISINOPRIL 10 MG PO TABS: 10.0000 mg | ORAL_TABLET | Freq: Every day | ORAL | 3 refills | Status: DC

## 2020-08-09 NOTE — Patient Instructions (Signed)
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
## 2020-08-10 ENCOUNTER — Other Ambulatory Visit: Payer: Self-pay | Admitting: Family

## 2020-08-10 ENCOUNTER — Telehealth: Payer: Self-pay | Admitting: *Deleted

## 2020-08-10 LAB — CBC WITH DIFFERENTIAL/PLATELET
Basophils Absolute: 0.1 10*3/uL (ref 0.0–0.2)
Basos: 2 %
EOS (ABSOLUTE): 0.2 10*3/uL (ref 0.0–0.4)
Eos: 4 %
Hematocrit: 51.7 % — ABNORMAL HIGH (ref 37.5–51.0)
Hemoglobin: 17.4 g/dL (ref 13.0–17.7)
Immature Grans (Abs): 0 10*3/uL (ref 0.0–0.1)
Immature Granulocytes: 0 %
Lymphocytes Absolute: 1.2 10*3/uL (ref 0.7–3.1)
Lymphs: 24 %
MCH: 30.3 pg (ref 26.6–33.0)
MCHC: 33.7 g/dL (ref 31.5–35.7)
MCV: 90 fL (ref 79–97)
Monocytes Absolute: 0.4 10*3/uL (ref 0.1–0.9)
Monocytes: 8 %
Neutrophils Absolute: 3.3 10*3/uL (ref 1.4–7.0)
Neutrophils: 62 %
Platelets: 194 10*3/uL (ref 150–450)
RBC: 5.74 x10E6/uL (ref 4.14–5.80)
RDW: 13.1 % (ref 11.6–15.4)
WBC: 5.2 10*3/uL (ref 3.4–10.8)

## 2020-08-10 LAB — CMP14+EGFR
ALT: 34 IU/L (ref 0–44)
AST: 20 IU/L (ref 0–40)
Albumin/Globulin Ratio: 2 (ref 1.2–2.2)
Albumin: 4.7 g/dL (ref 3.8–4.9)
Alkaline Phosphatase: 68 IU/L (ref 44–121)
BUN/Creatinine Ratio: 12 (ref 10–24)
BUN: 13 mg/dL (ref 8–27)
Bilirubin Total: 0.6 mg/dL (ref 0.0–1.2)
CO2: 19 mmol/L — ABNORMAL LOW (ref 20–29)
Calcium: 9.4 mg/dL (ref 8.6–10.2)
Chloride: 102 mmol/L (ref 96–106)
Creatinine, Ser: 1.11 mg/dL (ref 0.76–1.27)
GFR calc Af Amer: 83 mL/min/{1.73_m2} (ref >59)
GFR calc non Af Amer: 72 mL/min/{1.73_m2} (ref >59)
Globulin, Total: 2.3 g/dL (ref 1.5–4.5)
Glucose: 140 mg/dL — ABNORMAL HIGH (ref 65–99)
Potassium: 5 mmol/L (ref 3.5–5.2)
Sodium: 137 mmol/L (ref 134–144)
Total Protein: 7 g/dL (ref 6.0–8.5)

## 2020-08-10 LAB — LIPID PANEL
Chol/HDL Ratio: 5.1 ratio — ABNORMAL HIGH (ref 0.0–5.0)
Cholesterol, Total: 202 mg/dL — ABNORMAL HIGH (ref 100–199)
HDL: 40 mg/dL (ref >39)
LDL Chol Calc (NIH): 117 mg/dL — ABNORMAL HIGH (ref 0–99)
Triglycerides: 259 mg/dL — ABNORMAL HIGH (ref 0–149)
VLDL Cholesterol Cal: 45 mg/dL — ABNORMAL HIGH (ref 5–40)

## 2020-08-10 MED ORDER — ROSUVASTATIN CALCIUM 10 MG PO TABS: 10.0000 mg | ORAL_TABLET | Freq: Every day | ORAL | 3 refills | Status: DC

## 2020-08-10 MED ORDER — QVAR REDIHALER 80 MCG/ACT IN AERB: 2.0000 | INHALATION_SPRAY | Freq: Two times a day (BID) | RESPIRATORY_TRACT | 2 refills | Status: DC

## 2020-08-10 NOTE — Addendum Note (Signed)
Addended by: Evelina Dun A on: 08/10/2020 01:21 PM   Modules accepted: Orders

## 2020-08-10 NOTE — Telephone Encounter (Signed)
Fax from [facility] is no longer available - only available as Qvar Redihaler Please send for Rx for Redihaler if appropriate

## 2020-08-10 NOTE — Telephone Encounter (Signed)
Prescription sent to pharmacy.

## 2020-08-16 LAB — MICROALBUMIN / CREATININE URINE RATIO
Creatinine, Urine: 112.7 mg/dL
Microalb/Creat Ratio: 10 mg/g creat (ref 0–29)
Microalbumin, Urine: 11.1 ug/mL

## 2020-09-13 ENCOUNTER — Encounter: Payer: Self-pay | Admitting: *Deleted

## 2020-10-29 ENCOUNTER — Encounter: Payer: Self-pay | Admitting: Family Medicine

## 2020-11-03 ENCOUNTER — Encounter: Payer: Self-pay | Admitting: Family Medicine

## 2021-02-07 ENCOUNTER — Other Ambulatory Visit: Payer: Self-pay

## 2021-02-07 ENCOUNTER — Ambulatory Visit (INDEPENDENT_AMBULATORY_CARE_PROVIDER_SITE_OTHER): Payer: Commercial Managed Care - PPO | Admitting: Family

## 2021-02-07 ENCOUNTER — Encounter: Payer: Self-pay | Admitting: Family

## 2021-02-07 VITALS — BP 137/86 | HR 99 | Temp 97.6°F | Ht 73.0 in | Wt 267.8 lb

## 2021-02-07 DIAGNOSIS — Z1211 Encounter for screening for malignant neoplasm of colon: Secondary | ICD-10-CM

## 2021-02-07 DIAGNOSIS — Z0001 Encounter for general adult medical examination with abnormal findings: Secondary | ICD-10-CM

## 2021-02-07 DIAGNOSIS — E785 Hyperlipidemia, unspecified: Secondary | ICD-10-CM

## 2021-02-07 DIAGNOSIS — Z01818 Encounter for other preprocedural examination: Secondary | ICD-10-CM | POA: Diagnosis not present

## 2021-02-07 DIAGNOSIS — K219 Gastro-esophageal reflux disease without esophagitis: Secondary | ICD-10-CM | POA: Diagnosis not present

## 2021-02-07 DIAGNOSIS — E1165 Type 2 diabetes mellitus with hyperglycemia: Secondary | ICD-10-CM

## 2021-02-07 DIAGNOSIS — Z0000 Encounter for general adult medical examination without abnormal findings: Secondary | ICD-10-CM

## 2021-02-07 DIAGNOSIS — M545 Low back pain, unspecified: Secondary | ICD-10-CM

## 2021-02-07 DIAGNOSIS — E669 Obesity, unspecified: Secondary | ICD-10-CM

## 2021-02-07 DIAGNOSIS — G8929 Other chronic pain: Secondary | ICD-10-CM

## 2021-02-07 DIAGNOSIS — J4520 Mild intermittent asthma, uncomplicated: Secondary | ICD-10-CM | POA: Diagnosis not present

## 2021-02-07 LAB — BAYER DCA HB A1C WAIVED: HB A1C (BAYER DCA - WAIVED): 7.1 % — ABNORMAL HIGH (ref <7.0)

## 2021-02-07 NOTE — Progress Notes (Signed)
 Subjective:    Patient ID: [name] [name], male    DOB: 03/06/1961, 60 y.o.   MRN: [id number]  Chief Complaint  Patient presents with   Annual Exam   Pre-op Exam   Pt presents to the office today for CPE and surgerical clearance. He is scheduled for Spinal Cord stimulator.   Diabetes He presents for his follow-up diabetic visit. He has type 2 diabetes mellitus. There are no hypoglycemic associated symptoms. Pertinent negatives for diabetes include no blurred vision and no foot paresthesias. There are no hypoglycemic complications. Symptoms are stable. Diabetic complications include heart disease. Risk factors for coronary artery disease include dyslipidemia, diabetes mellitus, male sex, hypertension and sedentary lifestyle. He is following a generally healthy diet. He has had a previous visit with a dietitian. His overall blood glucose range is 110-130 mg/dl. An ACE inhibitor/angiotensin II receptor blocker is being taken. Eye exam is not current.  Asthma Associated symptoms include heartburn. His past medical history is significant for asthma.  Gastroesophageal Reflux He complains of belching and heartburn. This is a chronic problem. The current episode started more than 1 year ago. The problem occurs frequently. The problem has been waxing and waning. The symptoms are aggravated by certain foods. Risk factors include obesity. He has tried a PPI for the symptoms. The treatment provided moderate relief.  Back Pain The current episode started more than 1 year ago. The pain is present in the lumbar spine. The pain is at a severity of 8/10. The pain is moderate. He has tried analgesics, muscle relaxant and NSAIDs for the symptoms. The treatment provided mild relief.  Hyperlipidemia This is a chronic problem. The current episode started more than 1 year ago. Exacerbating diseases include obesity. Current antihyperlipidemic treatment includes statins. The current treatment provides moderate  improvement of lipids. Risk factors for coronary artery disease include dyslipidemia, diabetes mellitus, male sex, hypertension and a sedentary lifestyle.     Review of Systems  Eyes:  Negative for blurred vision.  Gastrointestinal:  Positive for heartburn.  Musculoskeletal:  Positive for back pain.  All other systems reviewed and are negative.     Objective:   Physical Exam Vitals reviewed.  Constitutional:      General: He is not in acute distress.    Appearance: He is well-developed. He is obese.  HENT:     Head: Normocephalic.     Right Ear: Tympanic membrane normal.     Left Ear: Tympanic membrane normal.  Eyes:     General:        Right eye: No discharge.        Left eye: No discharge.     Pupils: Pupils are equal, round, and reactive to light.  Neck:     Thyroid: No thyromegaly.  Cardiovascular:     Rate and Rhythm: Normal rate and regular rhythm.     Heart sounds: Normal heart sounds. No murmur heard. Pulmonary:     Effort: Pulmonary effort is normal. No respiratory distress.     Breath sounds: Normal breath sounds. No wheezing.  Abdominal:     General: Bowel sounds are normal. There is no distension.     Palpations: Abdomen is soft.     Tenderness: There is no abdominal tenderness.  Musculoskeletal:        General: No tenderness. Normal range of motion.     Cervical back: Normal range of motion and neck supple.  Skin:    General: Skin is warm and dry.       Findings: No erythema or rash.  Neurological:     Mental Status: He is alert and oriented to person, place, and time.     Cranial Nerves: No cranial nerve deficit.     Deep Tendon Reflexes: Reflexes are normal and symmetric.  Psychiatric:        Behavior: Behavior normal.        Thought Content: Thought content normal.        Judgment: Judgment normal.      BP 137/86   Pulse 99   Temp 97.6 F (36.4 C)   Ht 6' 1" (1.854 m)   Wt 267 lb 12.8 oz (121.5 kg)   SpO2 95%   BMI 35.33 kg/m       Assessment & Plan:  [name] [name] comes in today with chief complaint of Annual Exam and Pre-op Exam   Diagnosis and orders addressed:  1. Pre-op evaluation - EKG 12-Lead - CBC with Differential/Platelet - CMP14+EGFR  2. Mild intermittent asthma, unspecified whether complicated - CBC with Differential/Platelet - CMP14+EGFR  3. Gastroesophageal reflux disease, unspecified whether esophagitis present - CBC with Differential/Platelet - CMP14+EGFR  4. Uncontrolled type 2 diabetes mellitus with hyperglycemia (HCC) - Bayer DCA Hb A1c Waived - CBC with Differential/Platelet - CMP14+EGFR  5. Obesity (BMI 30-39.9) - CBC with Differential/Platelet - CMP14+EGFR  6. Dyslipidemia - CBC with Differential/Platelet - CMP14+EGFR - Lipid panel  7. Chronic low back pain, unspecified back pain laterality, unspecified whether sciatica present - CBC with Differential/Platelet - CMP14+EGFR  8. Annual physical exam - Bayer DCA Hb A1c Waived - CBC with Differential/Platelet - CMP14+EGFR - Lipid panel - TSH - Ambulatory referral to Gastroenterology  9. Colon cancer screening - CBC with Differential/Platelet - CMP14+EGFR - Ambulatory referral to Gastroenterology   Labs pending Health Maintenance reviewed Diet and exercise encouraged  Follow up plan: 3 months    Christy Hawks, FNP    

## 2021-02-07 NOTE — Patient Instructions (Signed)
Health Maintenance, Male Adopting a healthy lifestyle and getting preventive care are important in promoting health and wellness. Ask your health care provider about: The right schedule for you to have regular tests and exams. Things you can do on your own to prevent diseases and keep yourself healthy. What should I know about diet, weight, and exercise? Eat a healthy diet  Eat a diet that includes plenty of vegetables, fruits, low-fat dairy products, and lean protein. Do not eat a lot of foods that are high in solid fats, added sugars, or sodium.  Maintain a healthy weight Body mass index (BMI) is a measurement that can be used to identify possible weight problems. It estimates body fat based on height and weight. Your health care provider can help determine your BMI and help you achieve or maintain ahealthy weight. Get regular exercise Get regular exercise. This is one of the most important things you can do for your health. Most adults should: Exercise for at least 150 minutes each week. The exercise should increase your heart rate and make you sweat (moderate-intensity exercise). Do strengthening exercises at least twice a week. This is in addition to the moderate-intensity exercise. Spend less time sitting. Even light physical activity can be beneficial. Watch cholesterol and blood lipids Have your blood tested for lipids and cholesterol at 61 years of age, then havethis test every 5 years. You may need to have your cholesterol levels checked more often if: Your lipid or cholesterol levels are high. You are older than 61 years of age. You are at high risk for heart disease. What should I know about cancer screening? Many types of cancers can be detected early and may often be prevented. Depending on your health history and family history, you may need to have cancer screening at various ages. This may include screening for: Colorectal cancer. Prostate cancer. Skin cancer. Lung  cancer. What should I know about heart disease, diabetes, and high blood pressure? Blood pressure and heart disease High blood pressure causes heart disease and increases the risk of stroke. This is more likely to develop in people who have high blood pressure readings, are of African descent, or are overweight. Talk with your health care provider about your target blood pressure readings. Have your blood pressure checked: Every 3-5 years if you are 18-39 years of age. Every year if you are 40 years old or older. If you are between the ages of 65 and 75 and are a current or former smoker, ask your health care provider if you should have a one-time screening for abdominal aortic aneurysm (AAA). Diabetes Have regular diabetes screenings. This checks your fasting blood sugar level. Have the screening done: Once every three years after age 45 if you are at a normal weight and have a low risk for diabetes. More often and at a younger age if you are overweight or have a high risk for diabetes. What should I know about preventing infection? Hepatitis B If you have a higher risk for hepatitis B, you should be screened for this virus. Talk with your health care provider to find out if you are at risk forhepatitis B infection. Hepatitis C Blood testing is recommended for: Everyone born from 1945 through 1965. Anyone with known risk factors for hepatitis C. Sexually transmitted infections (STIs) You should be screened each year for STIs, including gonorrhea and chlamydia, if: You are sexually active and are younger than 61 years of age. You are older than 61 years of age   and your health care provider tells you that you are at risk for this type of infection. Your sexual activity has changed since you were last screened, and you are at increased risk for chlamydia or gonorrhea. Ask your health care provider if you are at risk. Ask your health care provider about whether you are at high risk for HIV.  Your health care provider may recommend a prescription medicine to help prevent HIV infection. If you choose to take medicine to prevent HIV, you should first get tested for HIV. You should then be tested every 3 months for as long as you are taking the medicine. Follow these instructions at home: Lifestyle Do not use any products that contain nicotine or tobacco, such as cigarettes, e-cigarettes, and chewing tobacco. If you need help quitting, ask your health care provider. Do not use street drugs. Do not share needles. Ask your health care provider for help if you need support or information about quitting drugs. Alcohol use Do not drink alcohol if your health care provider tells you not to drink. If you drink alcohol: Limit how much you have to 0-2 drinks a day. Be aware of how much alcohol is in your drink. In the [place] one drink equals one 12 oz bottle of beer (355 mL), one 5 oz glass of wine (148 mL), or one 1 oz glass of hard liquor (44 mL). General instructions Schedule regular health, dental, and eye exams. Stay current with your vaccines. Tell your health care provider if: You often feel depressed. You have ever been abused or do not feel safe at home. Summary Adopting a healthy lifestyle and getting preventive care are important in promoting health and wellness. Follow your health care provider's instructions about healthy diet, exercising, and getting tested or screened for diseases. Follow your health care provider's instructions on monitoring your cholesterol and blood pressure. This information is not intended to replace advice given to you by your health care provider. Make sure you discuss any questions you have with your healthcare provider. Document Revised: 06/05/2018 Document Reviewed: 06/05/2018 Elsevier Patient Education  2022 Elsevier Inc.  

## 2021-02-08 ENCOUNTER — Ambulatory Visit: Payer: Self-pay | Admitting: Orthopedic Surgery

## 2021-02-09 LAB — CBC WITH DIFFERENTIAL/PLATELET
Basophils Absolute: 0.1 10*3/uL (ref 0.0–0.2)
Basos: 2 %
EOS (ABSOLUTE): 0.3 10*3/uL (ref 0.0–0.4)
Eos: 5 %
Hematocrit: 52.4 % — ABNORMAL HIGH (ref 37.5–51.0)
Hemoglobin: 17.9 g/dL — ABNORMAL HIGH (ref 13.0–17.7)
Immature Grans (Abs): 0 10*3/uL (ref 0.0–0.1)
Immature Granulocytes: 0 %
Lymphocytes Absolute: 1.4 10*3/uL (ref 0.7–3.1)
Lymphs: 27 %
MCH: 29.7 pg (ref 26.6–33.0)
MCHC: 34.2 g/dL (ref 31.5–35.7)
MCV: 87 fL (ref 79–97)
Monocytes Absolute: 0.5 10*3/uL (ref 0.1–0.9)
Monocytes: 8 %
Neutrophils Absolute: 3.1 10*3/uL (ref 1.4–7.0)
Neutrophils: 58 %
Platelets: 171 10*3/uL (ref 150–450)
RBC: 6.03 x10E6/uL — ABNORMAL HIGH (ref 4.14–5.80)
RDW: 13.2 % (ref 11.6–15.4)
WBC: 5.4 10*3/uL (ref 3.4–10.8)

## 2021-02-09 LAB — CMP14+EGFR
ALT: 40 IU/L (ref 0–44)
AST: 23 IU/L (ref 0–40)
Albumin/Globulin Ratio: 2.1 (ref 1.2–2.2)
Albumin: 5 g/dL — ABNORMAL HIGH (ref 3.8–4.8)
Alkaline Phosphatase: 76 IU/L (ref 44–121)
BUN/Creatinine Ratio: 15 (ref 10–24)
BUN: 16 mg/dL (ref 8–27)
Bilirubin Total: 0.5 mg/dL (ref 0.0–1.2)
CO2: 20 mmol/L (ref 20–29)
Calcium: 9.8 mg/dL (ref 8.6–10.2)
Chloride: 102 mmol/L (ref 96–106)
Creatinine, Ser: 1.05 mg/dL (ref 0.76–1.27)
Globulin, Total: 2.4 g/dL (ref 1.5–4.5)
Glucose: 135 mg/dL — ABNORMAL HIGH (ref 65–99)
Potassium: 4.9 mmol/L (ref 3.5–5.2)
Sodium: 139 mmol/L (ref 134–144)
Total Protein: 7.4 g/dL (ref 6.0–8.5)
eGFR: 81 mL/min/{1.73_m2} (ref >59)

## 2021-02-09 LAB — LIPID PANEL
Chol/HDL Ratio: 3.5 ratio (ref 0.0–5.0)
Cholesterol, Total: 154 mg/dL (ref 100–199)
HDL: 44 mg/dL (ref >39)
LDL Chol Calc (NIH): 77 mg/dL (ref 0–99)
Triglycerides: 200 mg/dL — ABNORMAL HIGH (ref 0–149)
VLDL Cholesterol Cal: 33 mg/dL (ref 5–40)

## 2021-02-09 LAB — TSH: TSH: 0.66 u[IU]/mL (ref 0.450–4.500)

## 2021-02-17 DIAGNOSIS — E1165 Type 2 diabetes mellitus with hyperglycemia: Secondary | ICD-10-CM

## 2021-02-22 ENCOUNTER — Other Ambulatory Visit: Payer: Self-pay | Admitting: Family

## 2021-02-22 MED ORDER — FREESTYLE LIBRE 2 SENSOR MISC: 1.0000 | Freq: Four times a day (QID) | 2 refills | Status: DC

## 2021-02-22 NOTE — Progress Notes (Signed)
Surgical Instructions    Your procedure is scheduled on 03/10/21.  Report to [facility] Main Entrance "A" at 5:30 A.M., then check in with the Admitting office.  Call this number if you have problems the morning of surgery:  [phone]   If you have any questions prior to your surgery date call [phone]: Open Monday-Friday 8am-4pm    Remember:  Do not eat after midnight the night before your surgery  You may drink clear liquids until 4:30 the morning of your surgery.   Clear liquids allowed are: Water, Non-Citrus Juices (without pulp), Carbonated Beverages, Clear Tea, Black Coffee ONLY (NO MILK, CREAM OR POWDERED CREAMER of any kind), and Gatorade.  Please drink diet or low sugar drinks.    Take these medicines the morning of surgery with A SIP OF WATER:  rosuvastatin (CRESTOR) tamsulosin (FLOMAX)  If Needed: albuterol (VENTOLIN HFA) - bring day of surgery beclomethasone (QVAR REDIHALER) - bring day of surgery fluticasone (FLONASE) levocetirizine (XYZAL) omeprazole (PRILOSEC)   As of today, STOP taking any Aspirin (unless otherwise instructed by your surgeon) Aleve, Naproxen, Ibuprofen, Motrin, Advil, Goody's, BC's, all herbal medications, fish oil, and all vitamins.  WHAT DO I DO ABOUT MY DIABETES MEDICATION?   Do not take oral diabetes medicines (pills) the morning of surgery.  Do not take JARDIANCE the day before surgery or the morining of surgery.  Do not take METFORMIN the day of surgery.   The day of surgery, do not take other diabetes injectables, including Byetta (exenatide), Bydureon (exenatide ER), Victoza (liraglutide), or Trulicity (dulaglutide).  HOW TO MANAGE YOUR DIABETES BEFORE AND AFTER SURGERY  Why is it important to control my blood sugar before and after surgery? Improving blood sugar levels before and after surgery helps healing and can limit problems. A way of improving blood sugar control is eating a healthy diet by:  Eating less sugar and  carbohydrates  Increasing activity/exercise  Talking with your doctor about reaching your blood sugar goals High blood sugars (greater than 180 mg/dL) can raise your risk of infections and slow your recovery, so you will need to focus on controlling your diabetes during the weeks before surgery. Make sure that the doctor who takes care of your diabetes knows about your planned surgery including the date and location.  How do I manage my blood sugar before surgery? Check your blood sugar at least 4 times a day, starting 2 days before surgery, to make sure that the level is not too high or low.  Check your blood sugar the morning of your surgery when you wake up and every 2 hours until you get to the Short Stay unit.  If your blood sugar is less than 70 mg/dL, you will need to treat for low blood sugar: Do not take insulin. Treat a low blood sugar (less than 70 mg/dL) with  cup of clear juice (cranberry or apple), 4 glucose tablets, OR glucose gel. Recheck blood sugar in 15 minutes after treatment (to make sure it is greater than 70 mg/dL). If your blood sugar is not greater than 70 mg/dL on recheck, call [phone] for further instructions. Report your blood sugar to the short stay nurse when you get to Short Stay.  If you are admitted to the hospital after surgery: Your blood sugar will be checked by the staff and you will probably be given insulin after surgery (instead of oral diabetes medicines) to make sure you have good blood sugar levels. The goal for blood sugar control  after surgery is 80-180 mg/dL.           Do not wear jewelry  Do not wear lotions, powders, colognes, or deodorant. Do not shave 48 hours prior to surgery.  Men may shave face and neck. Do not bring valuables to the hospital.              [facility] is not responsible for any belongings or valuables.  Do NOT Smoke (Tobacco/Vaping) or drink Alcohol 24 hours prior to your procedure If you use a CPAP at night,  you may bring all equipment for your overnight stay.   Contacts, glasses, dentures or bridgework may not be worn into surgery, please bring cases for these belongings   For patients admitted to the hospital, discharge time will be determined by your treatment team.   Patients discharged the day of surgery will not be allowed to drive home, and someone needs to stay with them for 24 hours.  ONLY 1 SUPPORT PERSON MAY BE PRESENT WHILE YOU ARE IN SURGERY. IF YOU ARE TO BE ADMITTED ONCE YOU ARE IN YOUR ROOM YOU WILL BE ALLOWED TWO (2) VISITORS.  Minor children may have two parents present. Special consideration for safety and communication needs will be reviewed on a case by case basis.  Special instructions:    Oral Hygiene is also important to reduce your risk of infection.  Remember - BRUSH YOUR TEETH THE MORNING OF SURGERY WITH YOUR REGULAR TOOTHPASTE   [facility]- Preparing For Surgery  Before surgery, you can play an important role. Because skin is not sterile, your skin needs to be as free of germs as possible. You can reduce the number of germs on your skin by washing with CHG (chlorahexidine gluconate) Soap before surgery.  CHG is an antiseptic cleaner which kills germs and bonds with the skin to continue killing germs even after washing.     Please do not use if you have an allergy to CHG or antibacterial soaps. If your skin becomes reddened/irritated stop using the CHG.  Do not shave (including legs and underarms) for at least 48 hours prior to first CHG shower. It is OK to shave your face.  Please follow these instructions carefully.     Shower the NIGHT BEFORE SURGERY and the MORNING OF SURGERY with CHG Soap.   If you chose to wash your hair, wash your hair first as usual with your normal shampoo. After you shampoo, rinse your hair and body thoroughly to remove the shampoo.  Then [employer] and genitals (private parts) with your normal soap and rinse thoroughly to remove  soap.  After that Use CHG Soap as you would any other liquid soap. You can apply CHG directly to the skin and wash gently with a scrungie or a clean washcloth.   Apply the CHG Soap to your body ONLY FROM THE NECK DOWN.  Do not use on open wounds or open sores. Avoid contact with your eyes, ears, mouth and genitals (private parts). Wash Face and genitals (private parts)  with your normal soap.   Wash thoroughly, paying special attention to the area where your surgery will be performed.  Thoroughly rinse your body with warm water from the neck down.  DO NOT shower/wash with your normal soap after using and rinsing off the CHG Soap.  Pat yourself dry with a CLEAN TOWEL.  Wear CLEAN PAJAMAS to bed the night before surgery  Place CLEAN SHEETS on your bed the night before your  surgery  DO NOT SLEEP WITH PETS.   Day of Surgery:  Take a shower with CHG soap. Wear Clean/Comfortable clothing the morning of surgery Do not apply any deodorants/lotions.   Remember to brush your teeth WITH YOUR REGULAR TOOTHPASTE.   Please read over the following fact sheets that you were given.

## 2021-02-23 ENCOUNTER — Other Ambulatory Visit: Payer: Self-pay

## 2021-02-23 ENCOUNTER — Encounter (HOSPITAL_COMMUNITY): Payer: Self-pay

## 2021-02-23 ENCOUNTER — Encounter (HOSPITAL_COMMUNITY)
Admission: RE | Admit: 2021-02-23 | Discharge: 2021-02-23 | Disposition: A | Discharge status: Home / Self Care | Payer: Commercial Managed Care - PPO | Source: Ambulatory Visit | Attending: Orthopedic Surgery | Admitting: Orthopedic Surgery

## 2021-02-23 ENCOUNTER — Telehealth: Payer: Self-pay | Admitting: Family Medicine

## 2021-02-23 DIAGNOSIS — Z01812 Encounter for preprocedural laboratory examination: Secondary | ICD-10-CM | POA: Insufficient documentation

## 2021-02-23 LAB — CBC
HCT: 54.3 % — ABNORMAL HIGH (ref 39.0–52.0)
Hemoglobin: 17.5 g/dL — ABNORMAL HIGH (ref 13.0–17.0)
MCH: 30 pg (ref 26.0–34.0)
MCHC: 32.2 g/dL (ref 30.0–36.0)
MCV: 93 fL (ref 80.0–100.0)
Platelets: 157 10*3/uL (ref 150–400)
RBC: 5.84 MIL/uL — ABNORMAL HIGH (ref 4.22–5.81)
RDW: 13.2 % (ref 11.5–15.5)
WBC: 5.3 10*3/uL (ref 4.0–10.5)
nRBC: 0 % (ref 0.0–0.2)

## 2021-02-23 LAB — URINALYSIS, ROUTINE W REFLEX MICROSCOPIC
Bacteria, UA: NONE SEEN
Bilirubin Urine: NEGATIVE
Glucose, UA: >=500 mg/dL — AB
Hgb urine dipstick: NEGATIVE
Ketones, ur: NEGATIVE mg/dL
Leukocytes,Ua: NEGATIVE
Nitrite: NEGATIVE
Protein, ur: NEGATIVE mg/dL
Specific Gravity, Urine: 1.037 — ABNORMAL HIGH (ref 1.005–1.030)
pH: 5 (ref 5.0–8.0)

## 2021-02-23 LAB — BASIC METABOLIC PANEL
Anion gap: 9 (ref 5–15)
BUN: 14 mg/dL (ref 8–23)
CO2: 23 mmol/L (ref 22–32)
Calcium: 9.3 mg/dL (ref 8.9–10.3)
Chloride: 104 mmol/L (ref 98–111)
Creatinine, Ser: 0.93 mg/dL (ref 0.61–1.24)
GFR, Estimated: >60 mL/min (ref >60)
Glucose, Bld: 171 mg/dL — ABNORMAL HIGH (ref 70–99)
Potassium: 4.2 mmol/L (ref 3.5–5.1)
Sodium: 136 mmol/L (ref 135–145)

## 2021-02-23 LAB — PROTIME-INR
INR: 1 (ref 0.8–1.2)
Prothrombin Time: 13 seconds (ref 11.4–15.2)

## 2021-02-23 LAB — GLUCOSE, CAPILLARY: Glucose-Capillary: 210 mg/dL — ABNORMAL HIGH (ref 70–99)

## 2021-02-23 LAB — SURGICAL PCR SCREEN
MRSA, PCR: NEGATIVE
Staphylococcus aureus: POSITIVE — AB

## 2021-02-23 LAB — APTT: aPTT: 31 seconds (ref 24–36)

## 2021-02-23 NOTE — Progress Notes (Signed)
PCP - Dr. Loletha Grayer. [place] as Dealer - Denies  EP-Denies  Endocrine- Denies  Pulm-Denies  Chest x-ray - Denies  EKG - 02/07/21 (E)  Stress Test - Denies  ECHO - Denies  Cardiac Cath - Denies  AICD-na PM-na LOOP-na  Dialysis-Denies  Sleep Study - Denies CPAP - Denies  LABS- 02/23/21: CBC, BMP, PT, PCR, UA 03/07/21: COVID  ASA- Denies  ERAS- Yes- no drink  HA1C- 02/07/21 (E): 7.1 Fasting Blood Sugar - 90-210, today 210 Checks Blood Sugar _2-3____ times a month. Pt states his pcp has ordered the Freestyle continuous meter, and he should have it by the time of surgery.  Anesthesia- Yes- per order  Pt denies having chest pain, sob, or fever at this time. All instructions explained to the pt, with a verbal understanding of the material. Pt agrees to go over the instructions while at home for a better understanding. Pt also instructed to self quarantine after being tested for COVID-19. The opportunity to ask questions was provided.    Coronavirus Screening  Have you experienced the following symptoms:  Cough yes/no: No Fever (>100.9F)  yes/no: No Runny nose yes/no: No Sore throat yes/no: No Difficulty breathing/shortness of breath  yes/no: No  Have you or a family member traveled in the last 14 days and where? yes/no: No   If the patient indicates "YES" to the above questions, their PAT will be rescheduled to limit the exposure to others and, the surgeon will be notified. THE PATIENT WILL NEED TO BE ASYMPTOMATIC FOR 14 DAYS.   If the patient is not experiencing any of these symptoms, the PAT nurse will instruct them to NOT bring anyone with them to their appointment since they may have these symptoms or traveled as well.   Please remind your patients and families that hospital visitation restrictions are in effect and the importance of the restrictions.

## 2021-02-24 NOTE — Progress Notes (Signed)
Anesthesia Chart Review:  Case: [id number] Date/Time: 03/10/21 0715   Procedure: SPINAL CORD STIMULATOR PLACEMENT - 2.5 HRS 3C-BED   Anesthesia type: General   Pre-op diagnosis: Chronic lumbar back pain   Location: [facility] OR ROOM 04 / [facility]   Surgeons: Melina Schools, MD       DISCUSSION: Patient is a 60 year old male scheduled for the above procedure.  History includes former smoker, post-operative N/V, COPD, asthma (hay fever), GERD, DM2, spinal surgery (C6-7 ACDF 02/20/12).  BMI is consistent with obesity.  He had preoperative evaluation by PCP Sharion Balloon, FNP on 02/07/21.  She signed a clearance letter for surgery (see Media tab).  A1c 7.1% on 02/07/21. He has a Freestyle continuous glucose meter on order.  Preoperative COVID-19 testing is scheduled for 03/07/2021.  Anesthesia team to evaluate on the day of surgery.   VS: BP 129/82   Pulse 95   Temp 36.7 C (Oral)   Resp 18   Ht 6' (1.829 m)   Wt 122.7 kg   SpO2 97%   BMI 36.69 kg/m   PROVIDERS: Sharion Balloon, FNP is PCP    LABS: Labs reviewed: Acceptable for surgery.  A1c 7.1% and AST/ALT normal on 02/07/21 (all labs ordered are listed, but only abnormal results are displayed)  Labs Reviewed  SURGICAL PCR SCREEN - Abnormal; Notable for the following components:      Result Value   Staphylococcus aureus POSITIVE (*)    All other components within normal limits  GLUCOSE, CAPILLARY - Abnormal; Notable for the following components:   Glucose-Capillary 210 (*)    All other components within normal limits  CBC - Abnormal; Notable for the following components:   RBC 5.84 (*)    Hemoglobin 17.5 (*)    HCT 54.3 (*)    All other components within normal limits  BASIC METABOLIC PANEL - Abnormal; Notable for the following components:   Glucose, Bld 171 (*)    All other components within normal limits  URINALYSIS, ROUTINE W REFLEX MICROSCOPIC - Abnormal; Notable for the following components:   Specific Gravity, Urine  1.037 (*)    Glucose, UA >=500 (*)    All other components within normal limits  PROTIME-INR  APTT   Spirometry greater than 5 years ago.   EKG: 02/07/21:  Sinus  Rhythm  Low voltage in limb leads.    CV: N/A  Past Medical History:  Diagnosis Date   Arthritis    Asthma    from hayfever   Blood in stool    COPD (chronic obstructive pulmonary disease) (HCC)    Diabetes mellitus without complication ([facility]) 99991111   type 2   GERD (gastroesophageal reflux disease)    Hay fever    Hx of colonic polyps    Migraines    PONV (postoperative nausea and vomiting)    also history of motion sickness    Past Surgical History:  Procedure Laterality Date   ANTERIOR CERVICAL DECOMP/DISCECTOMY FUSION  02/20/2012   Procedure: ANTERIOR CERVICAL DECOMPRESSION/DISCECTOMY FUSION 1 LEVEL;  Surgeon: Erline Levine, MD;  Location: [facility] ORS;  Service: Neurosurgery;  Laterality: N/A;  Cervical six - seven  Anterior cervical decompression/diskectomy/fusion   APPENDECTOMY  1984   KNEE ARTHROSCOPY     right   NASAL SEPTUM SURGERY  2008    MEDICATIONS:  albuterol (VENTOLIN HFA) 108 (90 Base) MCG/ACT inhaler   beclomethasone (QVAR REDIHALER) 80 MCG/ACT inhaler   Continuous Blood Gluc Sensor (FREESTYLE LIBRE 2 SENSOR) MISC  Dulaglutide (TRULICITY) 1.5 0000000 SOPN   empagliflozin (JARDIANCE) 25 MG TABS tablet   fluticasone (FLONASE) 50 MCG/ACT nasal spray   glucose blood test strip   levocetirizine (XYZAL) 5 MG tablet   lisinopril (ZESTRIL) 10 MG tablet   metFORMIN (GLUCOPHAGE) 500 MG tablet   montelukast (SINGULAIR) 10 MG tablet   naproxen sodium (ALEVE) 220 MG tablet   omeprazole (PRILOSEC) 40 MG capsule   OVER THE COUNTER MEDICATION   rosuvastatin (CRESTOR) 10 MG tablet   tamsulosin (FLOMAX) 0.4 MG CAPS capsule   TRUEplus Lancets 33G MISC   No current facility-administered medications for this encounter.    Myra Gianotti, PA-C Surgical Short Stay/Anesthesiology [facility] Phone  [phone] [facility] Phone [phone] 02/24/2021 5:54 PM

## 2021-03-04 ENCOUNTER — Ambulatory Visit: Payer: Self-pay | Admitting: Orthopedic Surgery

## 2021-03-04 NOTE — H&P (Signed)
Subjective:  MRI Results for neck. Preop H&P for placement of spinal cord stimulator for chronic back pain.  Patient Active Problem List   Diagnosis Date Noted   Refusal of statin medication by patient 02/06/2020   Lumbar spondylosis 04/02/2019   Degeneration of lumbar intervertebral disc 08/07/2018   Obesity (BMI 30-39.9) 08/27/2017   Chronic low back pain 07/13/2017   Benign prostatic hyperplasia with incomplete bladder emptying 02/16/2017   Family history of colon cancer 02/16/2017   Diabetes type 2, uncontrolled ([place]) 09/10/2014   Dyslipidemia 123456   Metabolic syndrome [id number]   Asthma 09/13/2011   Allergic rhinitis 05/24/2011   GERD 01/12/2010   COLONIC POLYPS, HX OF 01/12/2010   Past Medical History:  Diagnosis Date   Arthritis    Asthma    from hayfever   Blood in stool    COPD (chronic obstructive pulmonary disease) ([place])    Diabetes mellitus without complication ([place]) 99991111   type 2   GERD (gastroesophageal reflux disease)    Hay fever    Hx of colonic polyps    Migraines    PONV (postoperative nausea and vomiting)    also history of motion sickness    Past Surgical History:  Procedure Laterality Date   ANTERIOR CERVICAL DECOMP/DISCECTOMY FUSION  02/20/2012   Procedure: ANTERIOR CERVICAL DECOMPRESSION/DISCECTOMY FUSION 1 LEVEL;  Surgeon: Erline Levine, MD;  Location: [facility];  Service: Neurosurgery;  Laterality: N/A;  Cervical six - seven  Anterior cervical decompression/diskectomy/fusion   APPENDECTOMY  1984   KNEE ARTHROSCOPY     right   NASAL SEPTUM SURGERY  2008    Current Outpatient Medications  Medication Sig Dispense Refill Last Dose   albuterol (VENTOLIN HFA) 108 (90 Base) MCG/ACT inhaler Inhale 2 puffs into the lungs every 6 (six) hours as needed for wheezing or shortness of breath. 18 g 2    beclomethasone (QVAR REDIHALER) 80 MCG/ACT inhaler Inhale 2 puffs into the lungs 2 (two) times daily. (Patient taking differently: Inhale 2  puffs into the lungs 2 (two) times daily as needed (shortness of breath or wheezing).) 1 each 2    Continuous Blood Gluc Sensor (FREESTYLE LIBRE 2 SENSOR) MISC 1 each by Does not apply route 4 (four) times daily. 1 each 2    Dulaglutide (TRULICITY) 1.5 0000000 SOPN Inject 1.5 mg into the skin once a week. 4 mL 2    empagliflozin (JARDIANCE) 25 MG TABS tablet Take 1 tablet (25 mg total) by mouth daily before breakfast. 90 tablet 1    fluticasone (FLONASE) 50 MCG/ACT nasal spray Place 2 sprays into both nostrils daily. (Patient taking differently: Place 2 sprays into both nostrils daily as needed for allergies.) 48 g 1    glucose blood test strip True Test Strips. Check blood sugars three times per day. E11.65. 300 each 3    levocetirizine (XYZAL) 5 MG tablet Take 1 tablet (5 mg total) by mouth every evening. (Patient taking differently: Take 5 mg by mouth daily as needed for allergies.) 90 tablet 1    lisinopril (ZESTRIL) 10 MG tablet Take 1 tablet (10 mg total) by mouth daily. (Patient not taking: Reported on 02/17/2021) 90 tablet 3    metFORMIN (GLUCOPHAGE) 500 MG tablet Take 1 tablet (500 mg total) by mouth 2 (two) times daily with a meal. 180 tablet 1    montelukast (SINGULAIR) 10 MG tablet Take 1 tablet (10 mg total) by mouth at bedtime. 90 tablet 1    naproxen sodium (ALEVE) 220  MG tablet Take 440 mg by mouth 2 (two) times daily with a meal.      omeprazole (PRILOSEC) 40 MG capsule Take 1 capsule (40 mg total) by mouth daily as needed. For acid reflux 90 capsule 1    OVER THE COUNTER MEDICATION Take 6 tablets by mouth daily. Balance of Nature Fruit and Veggie Capsules      rosuvastatin (CRESTOR) 10 MG tablet Take 1 tablet (10 mg total) by mouth daily. 90 tablet 3    tamsulosin (FLOMAX) 0.4 MG CAPS capsule Take 2 capsules (0.8 mg total) by mouth daily. 180 capsule 1    TRUEplus Lancets 33G MISC True Lancets. Check Blood Sugars Three times  Per Day. DX: E11.65 300 each 3    No current  facility-administered medications for this visit.   Allergies  Allergen Reactions   Other Swelling    hayfever    Social History   Tobacco Use   Smoking status: Former    Packs/day: 1.00    Types: Cigarettes   Smokeless tobacco: Never  Substance Use Topics   Alcohol use: Yes    Comment: occasional    Family History  Problem Relation Age of Onset   Cancer Maternal Uncle        colon   Cancer Maternal Uncle    Cancer Maternal Uncle    Cancer Maternal Uncle    Cancer Maternal Uncle    Cancer Maternal Uncle    Cancer Mother        colon, breast    Diabetes Mother    Diabetes Father        type II   Cancer Maternal Grandmother        breast, colon    Diabetes Brother    Diabetes Paternal Grandfather     Review of Systems Pertinent items are noted in HPI.  Objective:   Vitals: Ht: 6 ft 03/04/2021 02:26 pm Wt: 265.5 lbs 03/04/2021 02:28 pm BMI: 36 03/04/2021 02:28 pm  Clinical exam: Octavia Bruckner is a pleasant individual, who appears younger than their stated age. He is alert and orientated 3. No shortness of breath, chest pain. Abdomen is soft and non-tender, negative loss of bowel and bladder control, no rebound tenderness. Negative: skin lesions abrasions contusions Lungs: Clear to auscultation. No shortness of breath. Cardiac: Regular rate and rhythm no rubs gallops murmurs. No chest pain Peripheral pulses: 2+ dorsalis pedis/posterior tibialis pulses bilaterally. Compartment soft and nontender. Gait pattern: Normal Assistive devices: None Neuro: 5/5 motor strength in the lower extremity bilaterally. Some intermittent dysesthesias into the left upper extremity but normal motor strength in the upper extremity. Negative nerve root tension signs. Negative Babinski test, no clonus, negative Hoffman test. Musculoskeletal: Recent onset of significant neck pain radiating into the left upper extremity. Prior ACDF at C6-7 is noted. Chronic debilitating low back pain due to  degenerative disc disease. No SI joint pain. Cervical x-rays taken today in the office (AP/lateral) were reviewed: Demonstrate ACDF at C6-7 with adjacent segment degenerative changes C4-5 and C5-6. Patient has a questionable pseudoarthrosis with some reabsorption of the allograft spacer. Lumbar x-rays taken today in the office (AP/lateral/spot lateral) were reviewed: Demonstrate degenerative disc disease L3-5. No scoliosis. No fracture or spondylolisthesis. Thoracic MRI: completed on 01/20/21 was reviewed with the patient. It was completed at [facility]; I have independently reviewed the images as well as the radiology report. Mild foraminal narrowing and central stenosis at T8-9. No cord compression or intraspinal lesions. Mild degenerative changes T6-7 and T7-8.  No contraindications for plication of spinal cord stimulator Cervical MRI: At C4-5 there is severe left and mild to moderate right neural foraminal narrowing impinging upon the left C5 nerve root. At C5-6 there is mild to moderate spinal canal stenosis with moderate right and mild left neural foraminal narrowing  Assessment:   Octavia Bruckner is a very pleasant 60 year old gentleman who has chronic debilitating back pain as well as new onset of progressive neck and radicular left arm pain. The patient had a recent spinal cord stimulator trial and noted for the first time significant improvement in his quality-of-life. He presents now for definitive implantation. We have gone over the surgical procedure in great detail and all of his questions were encouraged and addressed.  We did also obtain an MRI of his cervical spine given the neck pain and radicular arm pain and the fact that after spinal cord stimulator implantation we would not be able to obtain an MRI. This does demonstrate severe left and mild to moderate right neural foraminal narrowing at C4-5 with impingement upon the exiting left C5 nerve root. There is also mild to moderate spinal canal  stenosis with moderate right and mild left neural foraminal narrowing at C5-6. There are no cord signal changes or intraspinal lesions and the patient does not have any upper extremity neurological deficits. Therefore, we will plan to proceed with a spinal cord stimulator placement and we will address the neck pain and radicular arm pain once the patient is 6 weeks out from a spinal cord stimulator placement. In the meantime, we will do a trial of gabapentin to help his nerve pain.   Plan:   Risks and benefits of surgery were discussed with the patient. These include: Infection, bleeding, death, stroke, paralysis, ongoing or worse pain, need for additional surgery, leak of spinal fluid, Failure of the battery requiring reoperation. Inability to place the paddle requiring the surgery to be aborted. Migration of the lead, failure to obtain results similar to the trial.  We have obtained preoperative medical clearance from the patient's primary care provider.  Patient is not on any blood thinners. Not using any aspirin. He does use anti-inflammatories as needed for pain he is holding these prior to surgery as instructed. He knows he is not to take any anti-inflammatories for 6 weeks after surgery.  We have also discussed the post-operative recovery period to include: bathing/showering restrictions, wound healing, activity (and driving) restrictions, medications/pain mangement.  We have also discussed post-operative redflags to include: signs and symptoms of postoperative infection, DVT/PE.  All patients questions were invited and answered

## 2021-03-04 NOTE — H&P (Deleted)
  The note originally documented on this encounter has been moved the the encounter in which it belongs.  

## 2021-03-07 ENCOUNTER — Other Ambulatory Visit (HOSPITAL_COMMUNITY)
Admission: RE | Admit: 2021-03-07 | Discharge: 2021-03-07 | Disposition: A | Discharge status: Home / Self Care | Payer: Commercial Managed Care - PPO | Source: Ambulatory Visit | Attending: Orthopedic Surgery | Admitting: Orthopedic Surgery

## 2021-03-07 DIAGNOSIS — Z01812 Encounter for preprocedural laboratory examination: Secondary | ICD-10-CM | POA: Diagnosis not present

## 2021-03-07 DIAGNOSIS — Z20822 Contact with and (suspected) exposure to covid-19: Secondary | ICD-10-CM | POA: Insufficient documentation

## 2021-03-07 LAB — SARS CORONAVIRUS 2 (TAT 6-24 HRS): SARS Coronavirus 2: NEGATIVE

## 2021-03-09 NOTE — Anesthesia Preprocedure Evaluation (Addendum)
Anesthesia Evaluation  Patient identified by MRN, date of birth, ID band Patient awake    Reviewed: Allergy & Precautions, NPO status , Patient's Chart, lab work & pertinent test results  History of Anesthesia Complications (+) PONV and history of anesthetic complications  Airway Mallampati: I       Dental no notable dental hx.    Pulmonary asthma , COPD,  COPD inhaler, former smoker,    Pulmonary exam normal        Cardiovascular Normal cardiovascular exam     Neuro/Psych negative psych ROS   GI/Hepatic GERD  Medicated,  Endo/Other  diabetes, Well Controlled, Type 2, Oral Hypoglycemic Agents  Renal/GU      Musculoskeletal   Abdominal (+) + obese,   Peds  Hematology negative hematology ROS (+)   Anesthesia Other Findings   Reproductive/Obstetrics                            Anesthesia Physical Anesthesia Plan  ASA: 2  Anesthesia Plan: General   Post-op Pain Management:    Induction: Intravenous  PONV Risk Score and Plan: 3 and Ondansetron, Midazolam, Scopolamine patch - Pre-op and Treatment may vary due to age or medical condition  Airway Management Planned: Oral ETT  Additional Equipment: None  Intra-op Plan:   Post-operative Plan: Extubation in OR  Informed Consent: I have reviewed the patients History and Physical, chart, labs and discussed the procedure including the risks, benefits and alternatives for the proposed anesthesia with the patient or authorized representative who has indicated his/her understanding and acceptance.     Dental advisory given  Plan Discussed with: CRNA  Anesthesia Plan Comments:        Anesthesia Quick Evaluation

## 2021-03-10 ENCOUNTER — Encounter (HOSPITAL_COMMUNITY): Payer: Self-pay | Admitting: Orthopedic Surgery

## 2021-03-10 ENCOUNTER — Other Ambulatory Visit: Payer: Self-pay

## 2021-03-10 ENCOUNTER — Observation Stay (HOSPITAL_COMMUNITY)
Admission: RE | Admit: 2021-03-10 | Discharge: 2021-03-10 | Disposition: A | Discharge status: Home / Self Care | Payer: Commercial Managed Care - PPO | Attending: Orthopedic Surgery | Admitting: Orthopedic Surgery

## 2021-03-10 ENCOUNTER — Ambulatory Visit (HOSPITAL_COMMUNITY): Payer: Commercial Managed Care - PPO | Admitting: Vascular Surgery

## 2021-03-10 ENCOUNTER — Ambulatory Visit (HOSPITAL_COMMUNITY): Payer: Commercial Managed Care - PPO

## 2021-03-10 ENCOUNTER — Encounter (HOSPITAL_COMMUNITY)
Admission: RE | Disposition: A | Discharge status: Home / Self Care | Payer: Self-pay | Source: Home / Self Care | Attending: Orthopedic Surgery

## 2021-03-10 ENCOUNTER — Ambulatory Visit (HOSPITAL_COMMUNITY): Payer: Commercial Managed Care - PPO | Admitting: Anesthesiology

## 2021-03-10 DIAGNOSIS — Z7984 Long term (current) use of oral hypoglycemic drugs: Secondary | ICD-10-CM | POA: Diagnosis not present

## 2021-03-10 DIAGNOSIS — M545 Low back pain, unspecified: Secondary | ICD-10-CM | POA: Diagnosis not present

## 2021-03-10 DIAGNOSIS — G894 Chronic pain syndrome: Secondary | ICD-10-CM | POA: Diagnosis present

## 2021-03-10 DIAGNOSIS — J45909 Unspecified asthma, uncomplicated: Secondary | ICD-10-CM | POA: Insufficient documentation

## 2021-03-10 DIAGNOSIS — Z87891 Personal history of nicotine dependence: Secondary | ICD-10-CM | POA: Insufficient documentation

## 2021-03-10 DIAGNOSIS — E119 Type 2 diabetes mellitus without complications: Secondary | ICD-10-CM | POA: Diagnosis not present

## 2021-03-10 DIAGNOSIS — Z79899 Other long term (current) drug therapy: Secondary | ICD-10-CM | POA: Diagnosis not present

## 2021-03-10 DIAGNOSIS — J449 Chronic obstructive pulmonary disease, unspecified: Secondary | ICD-10-CM | POA: Insufficient documentation

## 2021-03-10 DIAGNOSIS — Z419 Encounter for procedure for purposes other than remedying health state, unspecified: Secondary | ICD-10-CM

## 2021-03-10 DIAGNOSIS — G8929 Other chronic pain: Secondary | ICD-10-CM | POA: Diagnosis present

## 2021-03-10 HISTORY — PX: SPINAL CORD STIMULATOR INSERTION: SHX5378

## 2021-03-10 LAB — GLUCOSE, CAPILLARY
Glucose-Capillary: 151 mg/dL — ABNORMAL HIGH (ref 70–99)
Glucose-Capillary: 174 mg/dL — ABNORMAL HIGH (ref 70–99)

## 2021-03-10 SURGERY — INSERTION, SPINAL CORD STIMULATOR, LUMBAR
Anesthesia: General | Site: Spine Thoracic

## 2021-03-10 MED ORDER — ONDANSETRON HCL 4 MG/2ML IJ SOLN
4.0000 mg | Freq: Once | INTRAMUSCULAR | Status: AC | PRN
  Administered 2021-03-10: 4 mg via INTRAVENOUS

## 2021-03-10 MED ORDER — ONDANSETRON HCL 4 MG PO TABS: 4.0000 mg | ORAL_TABLET | Freq: Three times a day (TID) | ORAL | 0 refills | Status: DC | PRN

## 2021-03-10 MED ORDER — OXYCODONE HCL 5 MG PO TABS: 5.0000 mg | ORAL_TABLET | Freq: Once | ORAL | Status: DC | PRN

## 2021-03-10 MED ORDER — BUDESONIDE 0.25 MG/2ML IN SUSP: 0.2500 mg | Freq: Two times a day (BID) | RESPIRATORY_TRACT | Status: DC

## 2021-03-10 MED ORDER — ONDANSETRON HCL 4 MG/2ML IJ SOLN: 4.0000 mg | Freq: Four times a day (QID) | INTRAMUSCULAR | Status: DC | PRN

## 2021-03-10 MED ORDER — CHLORHEXIDINE GLUCONATE 0.12 % MT SOLN
15.0000 mL | Freq: Once | OROMUCOSAL | Status: AC
  Administered 2021-03-10: 15 mL via OROMUCOSAL
  Filled 2021-03-10: qty 15

## 2021-03-10 MED ORDER — ACETAMINOPHEN 650 MG RE SUPP: 650.0000 mg | RECTAL | Status: DC | PRN

## 2021-03-10 MED ORDER — ORAL CARE MOUTH RINSE
15.0000 mL | Freq: Once | OROMUCOSAL | Status: AC
Start: 2021-03-10 — End: 2021-03-10

## 2021-03-10 MED ORDER — MEPERIDINE HCL 25 MG/ML IJ SOLN: 6.2500 mg | INTRAMUSCULAR | Status: DC | PRN

## 2021-03-10 MED ORDER — SODIUM CHLORIDE 0.9% FLUSH: 3.0000 mL | Freq: Two times a day (BID) | INTRAVENOUS | Status: DC

## 2021-03-10 MED ORDER — FENTANYL CITRATE (PF) 250 MCG/5ML IJ SOLN
INTRAMUSCULAR | Status: AC
  Filled 2021-03-10: qty 5

## 2021-03-10 MED ORDER — PHENOL 1.4 % MT LIQD: 1.0000 | OROMUCOSAL | Status: DC | PRN

## 2021-03-10 MED ORDER — FENTANYL CITRATE (PF) 100 MCG/2ML IJ SOLN
INTRAMUSCULAR | Status: AC
  Filled 2021-03-10: qty 2

## 2021-03-10 MED ORDER — ROSUVASTATIN CALCIUM 5 MG PO TABS: 10.0000 mg | ORAL_TABLET | Freq: Every day | ORAL | Status: DC

## 2021-03-10 MED ORDER — MIDAZOLAM HCL 2 MG/2ML IJ SOLN
INTRAMUSCULAR | Status: AC
  Filled 2021-03-10: qty 2

## 2021-03-10 MED ORDER — FENTANYL CITRATE (PF) 250 MCG/5ML IJ SOLN
INTRAMUSCULAR | Status: DC | PRN
  Administered 2021-03-10: 100 ug via INTRAVENOUS
  Administered 2021-03-10: 50 ug via INTRAVENOUS
  Administered 2021-03-10: 100 ug via INTRAVENOUS

## 2021-03-10 MED ORDER — KETOROLAC TROMETHAMINE 30 MG/ML IJ SOLN: 30.0000 mg | Freq: Once | INTRAMUSCULAR | Status: DC | PRN

## 2021-03-10 MED ORDER — ACETAMINOPHEN 160 MG/5ML PO SOLN: 325.0000 mg | ORAL | Status: DC | PRN

## 2021-03-10 MED ORDER — DEXMEDETOMIDINE (PRECEDEX) IN NS 20 MCG/5ML (4 MCG/ML) IV SYRINGE
PREFILLED_SYRINGE | INTRAVENOUS | Status: DC | PRN
  Administered 2021-03-10: 8 ug via INTRAVENOUS

## 2021-03-10 MED ORDER — INSULIN ASPART 100 UNIT/ML IJ SOLN: 0.0000 [IU] | Freq: Every day | INTRAMUSCULAR | Status: DC

## 2021-03-10 MED ORDER — ROCURONIUM BROMIDE 10 MG/ML (PF) SYRINGE
PREFILLED_SYRINGE | INTRAVENOUS | Status: AC
  Filled 2021-03-10: qty 10

## 2021-03-10 MED ORDER — ONDANSETRON HCL 4 MG/2ML IJ SOLN
INTRAMUSCULAR | Status: AC
  Filled 2021-03-10: qty 2

## 2021-03-10 MED ORDER — ONDANSETRON HCL 4 MG PO TABS: 4.0000 mg | ORAL_TABLET | Freq: Four times a day (QID) | ORAL | Status: DC | PRN

## 2021-03-10 MED ORDER — DEXAMETHASONE SODIUM PHOSPHATE 10 MG/ML IJ SOLN
INTRAMUSCULAR | Status: AC
  Filled 2021-03-10: qty 1

## 2021-03-10 MED ORDER — CEFAZOLIN SODIUM-DEXTROSE 1-4 GM/50ML-% IV SOLN
1.0000 g | Freq: Three times a day (TID) | INTRAVENOUS | Status: DC
  Administered 2021-03-10: 1 g via INTRAVENOUS
  Filled 2021-03-10: qty 50

## 2021-03-10 MED ORDER — SUCCINYLCHOLINE CHLORIDE 200 MG/10ML IV SOSY
PREFILLED_SYRINGE | INTRAVENOUS | Status: AC
  Filled 2021-03-10: qty 10

## 2021-03-10 MED ORDER — ROCURONIUM BROMIDE 10 MG/ML (PF) SYRINGE
PREFILLED_SYRINGE | INTRAVENOUS | Status: DC | PRN
  Administered 2021-03-10: 100 mg via INTRAVENOUS

## 2021-03-10 MED ORDER — SODIUM CHLORIDE 0.9% FLUSH: 3.0000 mL | INTRAVENOUS | Status: DC | PRN

## 2021-03-10 MED ORDER — MENTHOL 3 MG MT LOZG: 1.0000 | LOZENGE | OROMUCOSAL | Status: DC | PRN

## 2021-03-10 MED ORDER — EMPAGLIFLOZIN 25 MG PO TABS: 25.0000 mg | ORAL_TABLET | Freq: Every day | ORAL | Status: DC

## 2021-03-10 MED ORDER — DEXMEDETOMIDINE (PRECEDEX) IN NS 20 MCG/5ML (4 MCG/ML) IV SYRINGE
PREFILLED_SYRINGE | INTRAVENOUS | Status: AC
  Filled 2021-03-10: qty 5

## 2021-03-10 MED ORDER — SUGAMMADEX SODIUM 200 MG/2ML IV SOLN
INTRAVENOUS | Status: DC | PRN
  Administered 2021-03-10: 200 mg via INTRAVENOUS

## 2021-03-10 MED ORDER — METFORMIN HCL 500 MG PO TABS: 500.0000 mg | ORAL_TABLET | Freq: Two times a day (BID) | ORAL | Status: DC

## 2021-03-10 MED ORDER — METHOCARBAMOL 500 MG PO TABS: 500.0000 mg | ORAL_TABLET | Freq: Three times a day (TID) | ORAL | 0 refills | Status: AC | PRN

## 2021-03-10 MED ORDER — ACETAMINOPHEN 325 MG PO TABS: 650.0000 mg | ORAL_TABLET | ORAL | Status: DC | PRN

## 2021-03-10 MED ORDER — ACETAMINOPHEN 325 MG PO TABS
325.0000 mg | ORAL_TABLET | ORAL | Status: DC | PRN
  Administered 2021-03-10: 650 mg via ORAL

## 2021-03-10 MED ORDER — METHOCARBAMOL 1000 MG/10ML IJ SOLN
500.0000 mg | Freq: Four times a day (QID) | INTRAVENOUS | Status: DC | PRN
  Filled 2021-03-10: qty 5

## 2021-03-10 MED ORDER — OXYCODONE HCL 5 MG PO TABS: 5.0000 mg | ORAL_TABLET | ORAL | Status: DC | PRN

## 2021-03-10 MED ORDER — SODIUM CHLORIDE 0.9 % IV SOLN: 250.0000 mL | INTRAVENOUS | Status: DC

## 2021-03-10 MED ORDER — LACTATED RINGERS IV SOLN: INTRAVENOUS | Status: DC

## 2021-03-10 MED ORDER — PROPOFOL 10 MG/ML IV BOLUS
INTRAVENOUS | Status: AC
  Filled 2021-03-10: qty 20

## 2021-03-10 MED ORDER — ONDANSETRON HCL 4 MG/2ML IJ SOLN
INTRAMUSCULAR | Status: DC | PRN
  Administered 2021-03-10: 4 mg via INTRAVENOUS

## 2021-03-10 MED ORDER — ACETAMINOPHEN 325 MG PO TABS
ORAL_TABLET | ORAL | Status: AC
  Filled 2021-03-10: qty 2

## 2021-03-10 MED ORDER — SCOPOLAMINE 1 MG/3DAYS TD PT72
MEDICATED_PATCH | TRANSDERMAL | Status: DC | PRN
  Administered 2021-03-10: 1 via TRANSDERMAL

## 2021-03-10 MED ORDER — LIDOCAINE 2% (20 MG/ML) 5 ML SYRINGE
INTRAMUSCULAR | Status: DC | PRN
  Administered 2021-03-10: 100 mg via INTRAVENOUS

## 2021-03-10 MED ORDER — ALBUTEROL SULFATE HFA 108 (90 BASE) MCG/ACT IN AERS: 2.0000 | INHALATION_SPRAY | Freq: Four times a day (QID) | RESPIRATORY_TRACT | Status: DC | PRN

## 2021-03-10 MED ORDER — BUPIVACAINE-EPINEPHRINE 0.25% -1:200000 IJ SOLN
INTRAMUSCULAR | Status: DC | PRN
  Administered 2021-03-10: 30 mL

## 2021-03-10 MED ORDER — CEFAZOLIN IN SODIUM CHLORIDE 3-0.9 GM/100ML-% IV SOLN
3.0000 g | INTRAVENOUS | Status: AC
  Administered 2021-03-10: 3 g via INTRAVENOUS
  Filled 2021-03-10: qty 100

## 2021-03-10 MED ORDER — MIDAZOLAM HCL 5 MG/5ML IJ SOLN
INTRAMUSCULAR | Status: DC | PRN
  Administered 2021-03-10: 2 mg via INTRAVENOUS

## 2021-03-10 MED ORDER — METHOCARBAMOL 500 MG PO TABS
500.0000 mg | ORAL_TABLET | Freq: Four times a day (QID) | ORAL | Status: DC | PRN
  Administered 2021-03-10: 500 mg via ORAL
  Filled 2021-03-10: qty 1

## 2021-03-10 MED ORDER — OXYCODONE HCL 5 MG/5ML PO SOLN: 5.0000 mg | Freq: Once | ORAL | Status: DC | PRN

## 2021-03-10 MED ORDER — HEMOSTATIC AGENTS (NO CHARGE) OPTIME
TOPICAL | Status: DC | PRN
Start: 2021-03-10 — End: 2021-03-10
  Administered 2021-03-10 (×2): 1 via TOPICAL

## 2021-03-10 MED ORDER — PROPOFOL 10 MG/ML IV BOLUS
INTRAVENOUS | Status: DC | PRN
  Administered 2021-03-10: 50 mg via INTRAVENOUS
  Administered 2021-03-10: 200 mg via INTRAVENOUS

## 2021-03-10 MED ORDER — SCOPOLAMINE 1 MG/3DAYS TD PT72
MEDICATED_PATCH | TRANSDERMAL | Status: AC
  Filled 2021-03-10: qty 1

## 2021-03-10 MED ORDER — BUPIVACAINE-EPINEPHRINE (PF) 0.25% -1:200000 IJ SOLN
INTRAMUSCULAR | Status: AC
  Filled 2021-03-10: qty 30

## 2021-03-10 MED ORDER — TAMSULOSIN HCL 0.4 MG PO CAPS: 0.8000 mg | ORAL_CAPSULE | Freq: Every day | ORAL | Status: DC

## 2021-03-10 MED ORDER — FENTANYL CITRATE (PF) 100 MCG/2ML IJ SOLN
25.0000 ug | INTRAMUSCULAR | Status: DC | PRN
  Administered 2021-03-10 (×2): 25 ug via INTRAVENOUS

## 2021-03-10 MED ORDER — DEXAMETHASONE SODIUM PHOSPHATE 10 MG/ML IJ SOLN
INTRAMUSCULAR | Status: DC | PRN
  Administered 2021-03-10: 10 mg via INTRAVENOUS

## 2021-03-10 MED ORDER — LIDOCAINE 2% (20 MG/ML) 5 ML SYRINGE
INTRAMUSCULAR | Status: AC
  Filled 2021-03-10: qty 5

## 2021-03-10 MED ORDER — PHENYLEPHRINE 40 MCG/ML (10ML) SYRINGE FOR IV PUSH (FOR BLOOD PRESSURE SUPPORT)
PREFILLED_SYRINGE | INTRAVENOUS | Status: DC | PRN
  Administered 2021-03-10: 120 ug via INTRAVENOUS

## 2021-03-10 MED ORDER — POLYETHYLENE GLYCOL 3350 17 G PO PACK: 17.0000 g | PACK | Freq: Every day | ORAL | Status: DC | PRN

## 2021-03-10 MED ORDER — INSULIN ASPART 100 UNIT/ML IJ SOLN: 0.0000 [IU] | Freq: Three times a day (TID) | INTRAMUSCULAR | Status: DC

## 2021-03-10 MED ORDER — OXYCODONE-ACETAMINOPHEN 10-325 MG PO TABS: 1.0000 | ORAL_TABLET | Freq: Four times a day (QID) | ORAL | 0 refills | Status: AC | PRN

## 2021-03-10 MED ORDER — 0.9 % SODIUM CHLORIDE (POUR BTL) OPTIME
TOPICAL | Status: DC | PRN
  Administered 2021-03-10: 1000 mL

## 2021-03-10 MED ORDER — OXYCODONE HCL 5 MG PO TABS
10.0000 mg | ORAL_TABLET | ORAL | Status: DC | PRN
  Administered 2021-03-10 (×2): 10 mg via ORAL
  Filled 2021-03-10 (×2): qty 2

## 2021-03-10 SURGICAL SUPPLY — 59 items
ANCHOR SWIFT LOCK ×4 IMPLANT
BAG COUNTER SPONGE SURGICOUNT (BAG) ×2 IMPLANT
CANISTER SUCT 3000ML PPV (MISCELLANEOUS) ×2 IMPLANT
CLSR STERI-STRIP ANTIMIC 1/2X4 (GAUZE/BANDAGES/DRESSINGS) ×2 IMPLANT
COVER MAYO STAND STRL (DRAPES) ×2 IMPLANT
COVER PROBE W GEL 5X96 (DRAPES) IMPLANT
COVER SURGICAL LIGHT HANDLE (MISCELLANEOUS) ×2 IMPLANT
DRAIN CHANNEL 15F RND FF W/TCR (WOUND CARE) IMPLANT
DRAPE C-ARM 42X72 X-RAY (DRAPES) ×2 IMPLANT
DRAPE SURG 17X23 STRL (DRAPES) ×2 IMPLANT
DRAPE U-SHAPE 47X51 STRL (DRAPES) ×2 IMPLANT
DRSG OPSITE POSTOP 4X6 (GAUZE/BANDAGES/DRESSINGS) ×2 IMPLANT
DURAPREP 26ML APPLICATOR (WOUND CARE) ×2 IMPLANT
ELECT BLADE 4.0 EZ CLEAN MEGAD (MISCELLANEOUS)
ELECT CAUTERY BLADE 6.4 (BLADE) IMPLANT
ELECT PENCIL ROCKER SW 15FT (MISCELLANEOUS) ×2 IMPLANT
ELECT REM PT RETURN 9FT ADLT (ELECTROSURGICAL) ×2
ELECTRODE BLDE 4.0 EZ CLN MEGD (MISCELLANEOUS) IMPLANT
ELECTRODE REM PT RTRN 9FT ADLT (ELECTROSURGICAL) ×1 IMPLANT
GENERATOR PULSE PROCLAIM 5ELIT (Neuro Prosthesis/Implant) ×1 IMPLANT
GLOVE SURG ENC MOIS LTX SZ7 (GLOVE) ×2 IMPLANT
GLOVE SURG GAMMEX LF SZ8.5 (GLOVE) ×2 IMPLANT
GLOVE SURG UNDER POLY LF SZ7 (GLOVE) ×2 IMPLANT
GLOVE SURG UNDER POLY LF SZ8.5 (GLOVE) ×2 IMPLANT
GOWN STRL REUS W/ TWL LRG LVL3 (GOWN DISPOSABLE) ×2 IMPLANT
GOWN STRL REUS W/TWL 2XL LVL3 (GOWN DISPOSABLE) ×2 IMPLANT
GOWN STRL REUS W/TWL LRG LVL3 (GOWN DISPOSABLE) ×2
GUIDE ROD (INSTRUMENTS) ×2 IMPLANT
KIT BASIN OR (CUSTOM PROCEDURE TRAY) ×2 IMPLANT
KIT TURNOVER KIT B (KITS) ×2 IMPLANT
LEAD OCTRODE GEN 8CH 60CM (Lead) ×4 IMPLANT
NEEDLE 22X1 1/2 (OR ONLY) (NEEDLE) ×2 IMPLANT
NEEDLE SPNL 18GX3.5 QUINCKE PK (NEEDLE) IMPLANT
NEEDLE SPNL 22GX3.5 QUINCKE BK (NEEDLE) ×4 IMPLANT
NS IRRIG 1000ML POUR BTL (IV SOLUTION) ×2 IMPLANT
PACK LAMINECTOMY ORTHO (CUSTOM PROCEDURE TRAY) ×2 IMPLANT
PACK UNIVERSAL I (CUSTOM PROCEDURE TRAY) ×2 IMPLANT
PAD ARMBOARD 7.5X6 YLW CONV (MISCELLANEOUS) ×6 IMPLANT
PROGRAMMER DBS W/MAGNET NMRI (MISCELLANEOUS) ×2 IMPLANT
PULSE GENERATOR PROCLAIM 5ELIT (Neuro Prosthesis/Implant) ×2 IMPLANT
SPATULA SILICONE BRAIN 10MM (MISCELLANEOUS) IMPLANT
SPONGE SURGIFOAM ABS GEL 100 (HEMOSTASIS) IMPLANT
SPONGE T-LAP 4X18 ~~LOC~~+RFID (SPONGE) ×4 IMPLANT
STAPLER VISISTAT 35W (STAPLE) ×2 IMPLANT
SURGIFLO W/THROMBIN 8M KIT (HEMOSTASIS) ×4 IMPLANT
SUT BONE WAX W31G (SUTURE) ×2 IMPLANT
SUT ETHIBOND 2 OS 4 DA (SUTURE) IMPLANT
SUT ETHIBOND 4 0 TF (SUTURE) IMPLANT
SUT MNCRL AB 3-0 PS2 18 (SUTURE) ×4 IMPLANT
SUT VIC AB 1 CT1 18XCR BRD 8 (SUTURE) ×1 IMPLANT
SUT VIC AB 1 CT1 8-18 (SUTURE) ×1
SUT VIC AB 2-0 CT1 18 (SUTURE) ×4 IMPLANT
SYR BULB IRRIG 60ML STRL (SYRINGE) ×2 IMPLANT
SYR CONTROL 10ML LL (SYRINGE) ×2 IMPLANT
TOOL TUNNELING 20 (MISCELLANEOUS) ×2 IMPLANT
TOWEL GREEN STERILE (TOWEL DISPOSABLE) ×2 IMPLANT
TOWEL GREEN STERILE FF (TOWEL DISPOSABLE) ×2 IMPLANT
WATER STERILE IRR 1000ML POUR (IV SOLUTION) ×2 IMPLANT
YANKAUER SUCT BULB TIP NO VENT (SUCTIONS) ×2 IMPLANT

## 2021-03-10 NOTE — Anesthesia Procedure Notes (Signed)
Procedure Name: Intubation Date/Time: 03/10/2021 7:43 AM Performed by: Geraldine Contras, CRNA Pre-anesthesia Checklist: Patient identified, Patient being monitored, Timeout performed, Emergency Drugs available and Suction available Patient Re-evaluated:Patient Re-evaluated prior to induction Oxygen Delivery Method: Circle system utilized Preoxygenation: Pre-oxygenation with 100% oxygen Induction Type: IV induction Ventilation: Mask ventilation without difficulty Laryngoscope Size: Mac, 3 and 4 Grade View: Grade I Tube type: Oral Tube size: 7.5 mm Number of attempts: 1 Airway Equipment and Method: Stylet Placement Confirmation: ETT inserted through vocal cords under direct vision, positive ETCO2 and breath sounds checked- equal and bilateral Secured at: 22 cm Tube secured with: Tape Dental Injury: Teeth and Oropharynx as per pre-operative assessment

## 2021-03-10 NOTE — Progress Notes (Signed)
50 mcg IV Fentanyl wasted in stericycle with Lancie RN after patient discharge.

## 2021-03-10 NOTE — Plan of Care (Signed)
WNL

## 2021-03-10 NOTE — Discharge Instructions (Signed)
 Spinal Cord Stimulator Replacement Care After This sheet gives you information about how to care for yourself after your procedure. Your health care provider may also give you more specific instructions. If you have problems or questions, contact your health care provider. What can I expect after the procedure? After the procedure, it is common to have: Soreness or pain. Some swelling in the area where the hardware was removed. A small amount of blood or clear fluid coming from your incision. Follow these instructions at home: If you have a cast: Do not stick anything inside the cast to scratch your skin. Doing that increases your risk of infection. Check the skin around the cast every day. Tell your health care provider about any concerns. You may put lotion on dry skin around the edges of the cast. Do not put lotion on the skin underneath the cast. Keep the cast clean and dry. Do not take baths, swim, or use a hot tub until your health care provider approves. Ask your health care provider if you may take showers. You may only be allowed to take sponge baths. Keep the bandage (dressing) dry until your health care provider says it can be removed. Ok to shower in 5 days.    Incision care  Follow instructions from your health care provider about how to take care of your incision. Make sure you: Wash your hands with soap and water before you change your dressing. If soap and water are not available, use hand sanitizer. Change your dressing as told by your health care provider. Leave stitches (sutures), skin glue, or adhesive strips in place. These skin closures may need to stay in place for 2 weeks or longer. If adhesive strip edges start to loosen and curl up, you may trim the loose edges. Do not remove adhesive strips completely unless your health care provider tells you to do that. Check your incision area every day for signs of infection. Check for: Redness. More swelling or pain. More  fluid or blood. Warmth. Pus or a bad smell. Managing pain, stiffness, and swelling  If directed, put ice on the affected area: Put ice in a plastic bag. Place a towel between your skin and the bag. Leave the ice on for 20 minutes, 2-3 times a day. Move your fingers or toes often to avoid stiffness and to lessen swelling.  Driving Do not drive or use heavy machinery while taking prescription pain medicine. Do not drive for 24 hours if you were given a medicine to help you relax (sedative) during your procedure. Ask your health care provider when it is safe to drive if you have a cast, splint, or boot on the affected limb. Activity Ask your health care provider what activities are safe for you during recovery, and ask what activities you need to avoid. Do not use the injured limb to support your body weight until your health care provider says that you can. Do not play contact sports until your health care provider approves. Do exercises as told by your health care provider. Avoid sitting for a long time without moving. Get up and move around at least every few hours. This will help prevent blood clots. General instructions Do not put pressure on any part of the cast or splint until it is fully hardened. This may take several hours. If you are taking prescription pain medicine, take actions to prevent or treat constipation. Your health care provider may recommend that you: Drink enough fluid to keep your   urine pale yellow. Eat foods that are high in fiber, such as fresh fruits and vegetables, whole grains, and beans. Limit foods that are high in fat and processed sugars, such as fried or sweet foods. Take an over-the-counter or prescription medicine for constipation. Do not use any products that contain nicotine or tobacco, such as cigarettes and e-cigarettes. These can delay bone healing after surgery. If you need help quitting, ask your health care provider. Take over-the-counter and  prescription medicines only as told by your health care provider. Keep all follow-up visits as told by your health care provider. This is important. Contact a health care provider if: You have lasting pain. You have redness around your incision. You have more swelling or pain around your incision. You have more fluid or blood coming from your incision. Your incision feels warm to the touch. You have pus or a bad smell coming from your incision. You are unable to do exercises or physical activity as told by your health care provider. Get help right away if: You have difficulty breathing. You have chest pain. You have severe pain. You have a fever or chills. You have numbness for more than 24 hours in the area where the hardware was removed. Summary After the procedure, it is common to have some pain and swelling in the area where the hardware was removed. Follow instructions from your health care provider about how to take care of your incision. Return to your normal activities as told by your health care provider. Ask your health care provider what activities are safe for you. This information is not intended to replace advice given to you by your health care provider. Make sure you discuss any questions you have with your health care provider. 

## 2021-03-10 NOTE — Progress Notes (Signed)
Physical Therapy Evaluation & Discharge  Patient Details Name: [name] [name] MRN: [id number] DOB: [birth year] Today's Date: 03/10/2021  History of Present Illness  Pt is a 60yo male presenting s/p thoracic spinal stimulator implantation. PMH: chronic pain, COPD, DM, C6-C7 fusion.  Clinical Impression  Pt presents s/p the procedure above. Mod I for transfers, supervision for ambulation and stairs. Pt completed DGI and scored 22 indicating he is not a fall risk. Reviewed precautions and educated pt on walking program, pt verbalized understanding. Wife will be available 24/7 for assist at discharge. No further skilled PT needs. We will sign off. Thank you for this consult.      Recommendations for follow up therapy are one component of a multi-disciplinary discharge planning process, led by the attending physician.  Recommendations may be updated based on patient status, additional functional criteria and insurance authorization.  Follow Up Recommendations No PT follow up    Equipment Recommendations  None recommended by PT    Recommendations for Other Services       Precautions / Restrictions Precautions Precautions: Back Precaution Booklet Issued: Yes (comment) Precaution Comments: reviewed precautions. Restrictions Weight Bearing Restrictions: No      Mobility  Bed Mobility               General bed mobility comments: Pt standing upon entry.    Transfers Overall transfer level: Modified independent                  Ambulation/Gait Ambulation/Gait assistance: Supervision Gait Distance (Feet): 400 Feet Assistive device: None Gait Pattern/deviations: WFL(Within Functional Limits) Gait velocity: decreased   General Gait Details: Pt ambulated with supervision for safety only; one minor LOB noted during DGI assessment but pt was able to self correct.  Stairs Stairs: Yes Stairs assistance: Supervision Stair Management: Two rails;Alternating  pattern;Forwards Number of Stairs: 10 General stair comments: Pt supervision for safety only. Encouraged pt to avoid stairs during recovery period for safety and pain management.  Wheelchair Mobility    Modified Rankin (Stroke Patients Only)       Balance Overall balance assessment: Modified Independent                               Standardized Balance Assessment Standardized Balance Assessment : Dynamic Gait Index   Dynamic Gait Index Level Surface: Normal Change in Gait Speed: Normal Gait with Horizontal Head Turns: Normal Gait with Vertical Head Turns: Mild Impairment Gait and Pivot Turn: Normal Step Over Obstacle: Normal Step Around Obstacles: Normal Steps: Mild Impairment Total Score: 22       Pertinent Vitals/Pain Pain Assessment: 0-10 Pain Score: 5  Pain Location: back Pain Descriptors / Indicators: Operative site guarding Pain Intervention(s): Monitored during session    Home Living Family/patient expects to be discharged to:: Private residence Living Arrangements: Spouse/significant other Available Help at Discharge: Family;Available 24 hours/day (Wife [name] [name]) Type of Home: House Home Access: Level entry     Home Layout: Multi-level;Laundry or work area in [employer]: Grab bars - toilet;Grab bars - tub/shower      Prior Function Level of Independence: Independent               Journalist, newspaper        Extremity/Trunk Assessment   Upper Extremity Assessment Upper Extremity Assessment: Defer to OT evaluation    Lower Extremity Assessment Lower Extremity Assessment: Overall WFL for tasks assessed    Cervical /  Trunk Assessment Cervical / Trunk Assessment: Other exceptions Cervical / Trunk Exceptions: s/p thoracic spinal stimulator implant 9/15. s/p C6-C7 fusion.  Communication   Communication: No difficulties  Cognition Arousal/Alertness: Awake/alert Behavior During Therapy: WFL for tasks  assessed/performed Overall Cognitive Status: Within Functional Limits for tasks assessed                                        General Comments General comments (skin integrity, edema, etc.): Thoracic and pelvic surgical sites inspected and appear clean and dry.    Exercises     Assessment/Plan    PT Assessment Patent does not need any further PT services  PT Problem List         PT Treatment Interventions      PT Goals (Current goals can be found in the Care Plan section)  Acute Rehab PT Goals Patient Stated Goal: to go home PT Goal Formulation: With patient/family Time For Goal Achievement: 03/10/21 Potential to Achieve Goals: Good    Frequency     Barriers to discharge        Co-evaluation               AM-PAC PT "6 Clicks" Mobility  Outcome Measure Help needed turning from your back to your side while in a flat bed without using bedrails?: None Help needed moving from lying on your back to sitting on the side of a flat bed without using bedrails?: None Help needed moving to and from a bed to a chair (including a wheelchair)?: None Help needed standing up from a chair using your arms (e.g., wheelchair or bedside chair)?: None Help needed to walk in hospital room?: None Help needed climbing 3-5 steps with a railing? : A Little 6 Click Score: 23    End of Session   Activity Tolerance: Patient tolerated treatment well Patient left: in bed;with call bell/phone within reach;with family/visitor present (Seated EOB.) Nurse Communication: Mobility status PT Visit Diagnosis: Pain;Difficulty in walking, not elsewhere classified (R26.2) Pain - part of body:  (Back)    Time: 1348-1400 PT Time Calculation (min) (ACUTE ONLY): 12 min   Charges:   PT Evaluation $PT Eval Low Complexity: 1 Low          Dawayne Cirri, SPT Dawayne Cirri 03/10/2021, 2:24 PM

## 2021-03-10 NOTE — Brief Op Note (Signed)
03/10/2021  9:37 AM  PATIENT:  [name] [name]  60 y.o. male  PRE-OPERATIVE DIAGNOSIS:  Chronic lumbar back pain  POST-OPERATIVE DIAGNOSIS:  Chronic lumbar back pain  PROCEDURE:  Procedure(s) with comments: SPINAL CORD STIMULATOR PLACEMENT (N/A) - 2.5 HRS 3C-BED  SURGEON:  Surgeon(s) and Role:    Melina Schools, MD - Primary  PHYSICIAN ASSISTANT:   ASSISTANTS: Nelson Chimes, PA  ANESTHESIA:   general  EBL:  50 mL   BLOOD ADMINISTERED:none  DRAINS: none   LOCAL MEDICATIONS USED:  MARCAINE     SPECIMEN:  No Specimen  DISPOSITION OF SPECIMEN:  N/A  COUNTS:  YES  TOURNIQUET:  * No tourniquets in log *  DICTATION: .Dragon Dictation  PLAN OF CARE: Admit for overnight observation  PATIENT DISPOSITION:  PACU - hemodynamically stable.

## 2021-03-10 NOTE — Progress Notes (Signed)
Patient alert and oriented, voiding adequately, skin clean, dry and intact without evidence of skin break down, or symptoms of complications - no redness or edema noted, only slight tenderness at site.  Patient states pain is manageable at time of discharge. Patient has an appointment with MD in 2 weeks 

## 2021-03-10 NOTE — Op Note (Signed)
OPERATIVE REPORT  DATE OF SURGERY: 03/10/2021  PATIENT NAME:  [name] [name] MRN: [id number] DOB: September 13, 1960  PCP: Sharion Balloon, FNP  PRE-OPERATIVE DIAGNOSIS: Chronic pain syndrome.  Successful trial spinal cord stimulator placement  POST-OPERATIVE DIAGNOSIS: Same  PROCEDURE:   Spinal cord stimulator placement  SURGEON:  Melina Schools, MD  PHYSICIAN ASSISTANT: Nelson Chimes, PA  ANESTHESIA:   General  EBL: 50 ml   Complications: None  Implants: Abbott spine: Proclaim XR battery.  2 percutaneous octrode leads  BRIEF HISTORY: [name] [name] is a 60 y.o. male who has had chronic debilitating back and neuropathic leg pain.  He recently had a trial spinal cord stimulator placement and noted significant improvement in his quality of life and decreased overall pain.  As result of the successful trial we elected to move forward with a permanent implant.  All appropriate risks, benefits, and alternatives to surgery were discussed with the patient and consent was obtained.  PROCEDURE DETAILS: Patient was brought into the operating room and was properly positioned on the operating room table.  After induction with general anesthesia the patient was endotracheally intubated.  A timeout was taken to confirm all important data: including patient, procedure, and the level. Teds, SCD's were applied.   Patient was turned prone onto the Wilson frame and all bony prominences were well-padded.  The thoracolumbar spine was prepped and draped in a standard fashion.  I then marked on the skin the T10 pedicle as well as the T12 and the midline incision.  Preoperatively I marked out the battery site incision in the holding area on the left gluteal region.  Both incision sites were infiltrated with quarter percent Marcaine with epinephrine.  A midline thoracic incision was made sharp dissection was carried out down to the fascia.  The fascia was sharply incised and I stripped the paraspinal  muscles to expose the T10, T11, and T12 spinous processes.  Self-retaining retractor was then placed and then I used fluoroscopy to confirm the T10 spinous process and pedicle.  I confirmed with the rep that the level was appropriate as maximum area of coverage was at the T9 vertebral body level.  Using a Leksell rongeur to remove the inferior third of the T10 minus process and then performed a laminotomy with a 2 mm Kerrison rongeur.  The ligamentum flavum was removed with the Kerrison rongeur to expose the dorsal surface of the thecal sac.  I then placed 2 percutaneous leads along the dorsal surface of the thecal sac.  They were able to be placed without resistance.  Final imaging demonstrated that they came to rest at the midportion of the T8 vertebral body and that they were spanning the appropriate level for coverage.  I confirmed this with the Abbott spine representative.  I then placed the Swift lock suture anchor over the lead and then secured it directly to the T11 spinous process with an Ethibond suture.  I then wrapped the leads around the T11 spinous process passing it through the T11-12 interspinous ligament.  With the lead now secured in place I then made a second incision over the battery site.  Incision was made and I dissected down bluntly to create a pocket approximately 2-1/2 cm deep.  I then used the submuscular passing device to position the leads from the thoracic wound To the battery incision site.  At this point I irrigated both wounds and made sure hemostasis using bipolar electrocautery and Floseal.  Once I confirmed hemostasis I disconnected  the Bovie and bipolar and then brought the battery on the surgical field.  The leads were then secured into the battery and locking screws tightened according manufacture standards.  The battery was then placed into the pocket and the excess lead was held up and placed on the undersurface of the battery the battery was then secured directly to the  deep fascia with two #1 Vicryl sutures.  The entire system was then tested by the representative and both leads were functioning appropriately as was the battery.  At this point both wounds were then closed in a layered fashion with interrupted #1 Vicryl sutures, 2-0 Vicryl sutures, and a 3-0 Monocryl for the skin.  Steri-Strips and a dry dressing were applied and the patient was ultimately extubated transferred to PACU without incident.  The end of the case all needle sponge counts were correct.  There were no adverse intraoperative events.  First assistant was Nelson Chimes, [place]: Instrumental in assisting with positioning, retraction and suction to provide visualization.  Aiding in manipulating the percutaneous leads while I inserted them and wound closure.  Melina Schools, MD 03/10/2021 9:23 AM

## 2021-03-10 NOTE — H&P (Signed)
Addendum H&P: there has been no change in the patient's clinical exam since his last office note of 03/04/2021.  Patient has chronic debilitating back and neuropathic leg pain which was significantly improved after a recent spinal cord stimulator trial.  As result of the successful trial he presents today for permanent implantation.  All risks, benefits, alternatives to surgery were discussed with the patient he is agreed to move forward with surgery.

## 2021-03-10 NOTE — Transfer of Care (Signed)
Immediate Anesthesia Transfer of Care Note  Patient: [name] [name]  Procedure(s) Performed: SPINAL CORD STIMULATOR PLACEMENT (Spine Thoracic)  Patient Location: PACU  Anesthesia Type:General  Level of Consciousness: awake  Airway & Oxygen Therapy: Patient Spontanous Breathing  Post-op Assessment: Report given to RN  Post vital signs: stable  Last Vitals:  Vitals Value Taken Time  BP 119/71 03/10/21 1010  Temp 36.1 C 03/10/21 1010  Pulse 90 03/10/21 1015  Resp 17 03/10/21 1015  SpO2 89 % 03/10/21 1015  Vitals shown include unvalidated device data.  Last Pain:  Vitals:   03/10/21 1010  TempSrc:   PainSc: Asleep         Complications: No notable events documented.

## 2021-03-10 NOTE — Evaluation (Signed)
Occupational Therapy Evaluation Patient Details Name: [name] [name] MRN: [id number] DOB: [birth year] Today's Date: 03/10/2021   History of Present Illness Pt is a 60yo male presenting s/p thoracic spinal stimulator implantation. PMH: chronic pain, COPD, DM, C6-C7 fusion.   Clinical Impression   Patient evaluated by Occupational Therapy with no further acute OT needs identified. All education has been completed and the patient has no further questions. All education has been completed.  See below for any follow-up Occupational Therapy or equipment needs. OT is signing off. Thank you for this referral.       Recommendations for follow up therapy are one component of a multi-disciplinary discharge planning process, led by the attending physician.  Recommendations may be updated based on patient status, additional functional criteria and insurance authorization.   Follow Up Recommendations  No OT follow up;Supervision - Intermittent    Equipment Recommendations  None recommended by OT    Recommendations for Other Services       Precautions / Restrictions Precautions Precautions: Back Precaution Booklet Issued: Yes (comment) Precaution Comments: Pt instructed in back precautions Restrictions Weight Bearing Restrictions: No      Mobility Bed Mobility               General bed mobility comments: Pt standing upon entry.    Transfers Overall transfer level: Modified independent                    Balance Overall balance assessment: Modified Independent                                       ADL either performed or assessed with clinical judgement   ADL Overall ADL's : Needs assistance/impaired Eating/Feeding: Independent   Grooming: Wash/dry hands;Wash/dry face;Oral care;Brushing hair;Supervision/safety;Standing Grooming Details (indicate cue type and reason): reviewed safe technique for oral care Upper Body Bathing: Set up;Supervision/  safety;Sitting   Lower Body Bathing: Supervison/ safety;Sit to/from stand Lower Body Bathing Details (indicate cue type and reason): discussed sitting to shower Upper Body Dressing : Set up;Sitting   Lower Body Dressing: Set up;Sit to/from stand;Supervision/safety Lower Body Dressing Details (indicate cue type and reason): able to perform figure 4 Toilet Transfer: Supervision/safety;Ambulation;Comfort height toilet;Grab bars   Toileting- Clothing Manipulation and Hygiene: Supervision/safety;Sit to/from Nurse, children's Details (indicate cue type and reason): reviewed safe tecnique Functional mobility during ADLs: Supervision/safety General ADL Comments: reviewed need to change positions every 45-60 mins and to use standing desk to work - avoid sitting for long periods     Vision         Perception     Praxis      Pertinent Vitals/Pain Pain Assessment: 0-10 Pain Score: 5  Pain Location: back Pain Descriptors / Indicators: Operative site guarding Pain Intervention(s): Monitored during session     Hand Dominance     Extremity/Trunk Assessment Upper Extremity Assessment Upper Extremity Assessment: Overall WFL for tasks assessed   Lower Extremity Assessment Lower Extremity Assessment: Overall WFL for tasks assessed   Cervical / Trunk Assessment Cervical / Trunk Assessment: Other exceptions Cervical / Trunk Exceptions: s/p thoracic spinal stimulator implant 9/15. s/p C6-C7 fusion.   Communication Communication Communication: No difficulties   Cognition Arousal/Alertness: Awake/alert Behavior During Therapy: WFL for tasks assessed/performed Overall Cognitive Status: Within Functional Limits for tasks assessed  General Comments  Thoracic and pelvic surgical sites inspected and appear clean and dry.    Exercises     Shoulder Instructions      Home Living Family/patient expects to be discharged to::  Private residence Living Arrangements: Spouse/significant other Available Help at Discharge: Family;Available 24 hours/day (Wife [name] [name]) Type of Home: House Home Access: Level entry     Home Layout: Multi-level;Laundry or work area in Building surveyor of Steps: 2 flights Alternate Level Stairs-Rails: Left;Right;Can reach both Bathroom Shower/Tub: Teacher, early years/pre: Handicapped height Bathroom Accessibility: Yes   Home Equipment: Grab bars - toilet;Grab bars - tub/shower;Shower seat   Additional Comments: wife available to assist as needed      Prior Functioning/Environment Level of Independence: Independent                 OT Problem List: Decreased activity tolerance;Pain      OT Treatment/Interventions:      OT Goals(Current goals can be found in the care plan section) Acute Rehab OT Goals Patient Stated Goal: to go home OT Goal Formulation: All assessment and education complete, DC therapy  OT Frequency:     Barriers to D/C:            Co-evaluation              AM-PAC OT "6 Clicks" Daily Activity     Outcome Measure Help from another person eating meals?: None Help from another person taking care of personal grooming?: A Little Help from another person toileting, which includes using toliet, bedpan, or urinal?: A Little Help from another person bathing (including washing, rinsing, drying)?: A Little Help from another person to put on and taking off regular upper body clothing?: A Little Help from another person to put on and taking off regular lower body clothing?: A Little 6 Click Score: 19   End of Session Nurse Communication: Mobility status  Activity Tolerance: Patient tolerated treatment well Patient left: in bed;with call bell/phone within reach;with family/visitor present  OT Visit Diagnosis: Pain Pain - part of body:  (back)                Time: [id number] OT Time Calculation (min): 11  min Charges:  OT General Charges $OT Visit: 1 Visit OT Evaluation $OT Eval Low Complexity: 1 Low  Nilsa Nutting., OTR/L Acute Rehabilitation Services Pager [phone] Office [phone]   Lucille Passy M 03/10/2021, 2:56 PM

## 2021-03-11 ENCOUNTER — Encounter (HOSPITAL_COMMUNITY): Payer: Self-pay | Admitting: Orthopedic Surgery

## 2021-03-15 NOTE — Discharge Summary (Signed)
Patient ID: [name] [name] MRN: [id number] DOB/AGE: 12-08-1960 60 y.o.  Admit date: 03/10/2021 Discharge date: 03/10/2021  Admission Diagnoses:  Active Problems:   Chronic pain   Discharge Diagnoses:  Active Problems:   Chronic pain  status post Procedure(s): SPINAL CORD STIMULATOR PLACEMENT  Past Medical History:  Diagnosis Date   Arthritis    Asthma    from hayfever   Blood in stool    COPD (chronic obstructive pulmonary disease) (HCC)    Diabetes mellitus without complication ([place]) 21/19/4174   type 2   GERD (gastroesophageal reflux disease)    Hay fever    Hx of colonic polyps    Migraines    PONV (postoperative nausea and vomiting)    also history of motion sickness    Surgeries: Procedure(s): SPINAL CORD STIMULATOR PLACEMENT on 03/10/2021   Consultants:   Discharged Condition: Improved  Hospital Course: [name] [name] is an 60 y.o. male who was admitted 03/10/2021 for operative treatment of Chronic lumbar back pain. Patient failed conservative treatments (please see the history and physical for the specifics) and had severe unremitting pain that affects sleep, daily activities and work/hobbies. After pre-op clearance, the patient was taken to the operating room on 03/10/2021 and underwent  Procedure(s): Crane.    Patient was given perioperative antibiotics:  Anti-infectives (From admission, onward)    Start     Dose/Rate Route Frequency Ordered Stop   03/10/21 1600  ceFAZolin (ANCEF) IVPB 1 g/50 mL premix  Status:  Discontinued        1 g 100 mL/hr over 30 Minutes Intravenous Every 8 hours 03/10/21 1141 03/10/21 2259   03/10/21 0600  ceFAZolin (ANCEF) IVPB 3g/100 mL premix        3 g 200 mL/hr over 30 Minutes Intravenous 30 min pre-op 03/10/21 0600 03/10/21 0736        Patient was given sequential compression devices and early ambulation to prevent DVT.   Patient benefited maximally from hospital stay and there  were no complications. At the time of discharge, the patient was urinating/moving their bowels without difficulty, tolerating a regular diet, pain is controlled with oral pain medications and they have been cleared by PT/OT.   Recent vital signs: No data found.   Recent laboratory studies: No results for input(s): WBC, HGB, HCT, PLT, NA, K, CL, CO2, BUN, CREATININE, GLUCOSE, INR, CALCIUM in the last 72 hours.  Invalid input(s): PT, 2   Discharge Medications:   Allergies as of 03/10/2021       Reactions   Other Swelling   hayfever        Medication List     STOP taking these medications    lisinopril 10 MG tablet Commonly known as: ZESTRIL   naproxen sodium 220 MG tablet Commonly known as: ALEVE   OVER THE COUNTER MEDICATION       TAKE these medications    albuterol 108 (90 Base) MCG/ACT inhaler Commonly known as: VENTOLIN HFA Inhale 2 puffs into the lungs every 6 (six) hours as needed for wheezing or shortness of breath.   empagliflozin 25 MG Tabs tablet Commonly known as: Jardiance Take 1 tablet (25 mg total) by mouth daily before breakfast.   fluticasone 50 MCG/ACT nasal spray Commonly known as: FLONASE Place 2 sprays into both nostrils daily. What changed:  when to take this reasons to take this   FreeStyle Libre 2 Sensor Misc 1 each by Does not apply route 4 (four) times daily.  glucose blood test strip True Test Strips. Check blood sugars three times per day. E11.65.   levocetirizine 5 MG tablet Commonly known as: Xyzal Take 1 tablet (5 mg total) by mouth every evening. What changed:  when to take this reasons to take this   metFORMIN 500 MG tablet Commonly known as: GLUCOPHAGE Take 1 tablet (500 mg total) by mouth 2 (two) times daily with a meal.   methocarbamol 500 MG tablet Commonly known as: Robaxin Take 1 tablet (500 mg total) by mouth every 8 (eight) hours as needed for up to 5 days for muscle spasms.   montelukast 10 MG  tablet Commonly known as: SINGULAIR Take 1 tablet (10 mg total) by mouth at bedtime.   omeprazole 40 MG capsule Commonly known as: PRILOSEC Take 1 capsule (40 mg total) by mouth daily as needed. For acid reflux   ondansetron 4 MG tablet Commonly known as: Zofran Take 1 tablet (4 mg total) by mouth every 8 (eight) hours as needed for nausea or vomiting.   oxyCODONE-acetaminophen 10-325 MG tablet Commonly known as: Percocet Take 1 tablet by mouth every 6 (six) hours as needed for up to 5 days for pain.   Qvar RediHaler 80 MCG/ACT inhaler Generic drug: beclomethasone Inhale 2 puffs into the lungs 2 (two) times daily. What changed:  when to take this reasons to take this   rosuvastatin 10 MG tablet Commonly known as: Crestor Take 1 tablet (10 mg total) by mouth daily.   tamsulosin 0.4 MG Caps capsule Commonly known as: FLOMAX Take 2 capsules (0.8 mg total) by mouth daily.   TRUEplus Lancets 33G Misc True Lancets. Check Blood Sugars Three times  Per Day. DX: E17.40   Trulicity 1.5 CX/4.4YJ Sopn Generic drug: Dulaglutide Inject 1.5 mg into the skin once a week.        Diagnostic Studies: DG THORACOLUMABAR SPINE  Result Date: 03/10/2021 CLINICAL DATA:  Spinal cord stimulator placement EXAM: THORACOLUMBAR SPINE 1V COMPARISON:  None. FINDINGS: Intraoperative spot images demonstrate placement of spinal cord stimulators in the region of the lower thoracic spine. IMPRESSION: Spinal stimulator placement in the lower thoracic spine region. Electronically Signed   By: Rolm Baptise M.D.   On: 03/10/2021 09:49   DG C-Arm 1-60 Min-No Report  Result Date: 03/10/2021 Fluoroscopy was utilized by the requesting physician.  No radiographic interpretation.    Discharge Instructions     Incentive spirometry RT   Complete by: As directed         Follow-up Information     Melina Schools, MD Follow up in 2 week(s).   Specialty: Orthopedic Surgery Why: If symptoms worsen, For suture  removal, For wound re-check Contact information: [address] [place]  [address] [phone]                 Discharge Plan:  discharge to home  Disposition: stable    Signed: Charlyne Petrin for [facility] PA-C [facility] [phone] 03/15/2021, 8:54 AM

## 2021-03-17 NOTE — Anesthesia Postprocedure Evaluation (Signed)
Anesthesia Post Note  Patient: [name] [name]  Procedure(s) Performed: SPINAL CORD STIMULATOR PLACEMENT (Spine Thoracic)     Patient location during evaluation: PACU Anesthesia Type: General Level of consciousness: awake Pain management: pain level controlled Vital Signs Assessment: post-procedure vital signs reviewed and stable Respiratory status: spontaneous breathing Cardiovascular status: stable Anesthetic complications: yes (PONV)   No notable events documented.  Last Vitals:  Vitals:   03/10/21 1203 03/10/21 1606  BP: 127/87 (!) 139/92  Pulse: 99 97  Resp: 20 18  Temp: 36.8 C 36.9 C  SpO2: 97% 97%    Last Pain:  Vitals:   03/10/21 1606  TempSrc: Oral  PainSc:                  Huston Foley

## 2021-03-21 ENCOUNTER — Telehealth: Payer: Self-pay | Admitting: Family

## 2021-03-21 DIAGNOSIS — J301 Allergic rhinitis due to pollen: Secondary | ICD-10-CM

## 2021-03-21 DIAGNOSIS — N401 Enlarged prostate with lower urinary tract symptoms: Secondary | ICD-10-CM

## 2021-03-21 DIAGNOSIS — E1165 Type 2 diabetes mellitus with hyperglycemia: Secondary | ICD-10-CM

## 2021-03-21 MED ORDER — METFORMIN HCL 500 MG PO TABS: 500.0000 mg | ORAL_TABLET | Freq: Two times a day (BID) | ORAL | 0 refills | Status: DC

## 2021-03-21 MED ORDER — TAMSULOSIN HCL 0.4 MG PO CAPS
0.8000 mg | ORAL_CAPSULE | Freq: Every day | ORAL | 1 refills | Status: DC
Start: 2021-03-21 — End: 2021-09-23

## 2021-03-21 MED ORDER — FLUTICASONE PROPIONATE 50 MCG/ACT NA SUSP: 2.0000 | Freq: Every day | NASAL | 1 refills | Status: DC

## 2021-03-21 MED ORDER — TRULICITY 1.5 MG/0.5ML ~~LOC~~ SOAJ
1.5000 mg | SUBCUTANEOUS | 0 refills | Status: DC
Start: 2021-03-21 — End: 2021-09-28

## 2021-03-21 MED ORDER — EMPAGLIFLOZIN 25 MG PO TABS: 25.0000 mg | ORAL_TABLET | Freq: Every day | ORAL | 0 refills | Status: DC

## 2021-03-21 MED ORDER — ROSUVASTATIN CALCIUM 10 MG PO TABS: 10.0000 mg | ORAL_TABLET | Freq: Every day | ORAL | 1 refills | Status: DC

## 2021-03-21 MED ORDER — OMEPRAZOLE 40 MG PO CPDR: 40.0000 mg | DELAYED_RELEASE_CAPSULE | Freq: Every day | ORAL | 1 refills | Status: DC | PRN

## 2021-03-21 MED ORDER — MONTELUKAST SODIUM 10 MG PO TABS: 10.0000 mg | ORAL_TABLET | Freq: Every day | ORAL | 1 refills | Status: DC

## 2021-03-21 NOTE — Telephone Encounter (Signed)
  Prescription Request  03/21/2021  What is the name of the medication or equipment? ALL  Have you contacted your pharmacy to request a refill? YES  Which pharmacy would you like this sent to? [facility]  Pt had CPE on 02/07/21 and no refills were called in for his meds. Please send refills to [facility].

## 2021-03-21 NOTE — Telephone Encounter (Signed)
Refills sent to pharmacy. 

## 2021-03-24 ENCOUNTER — Telehealth: Payer: Self-pay | Admitting: *Deleted

## 2021-03-24 NOTE — Telephone Encounter (Signed)
Key: [id number] Trulicity 1.5MG /0.5ML pen-injectors Per - pre prompted request: [employer] does not handle this review. Please visit [email or web address] to start a prior authorization or fax information to [phone]. Please include all supporting chart notes. You may contact [employer] at [phone]  I will initiate my own PA and see if we can get this covered. Continued answer  Will try [email or web address]   Answer there:  PromptPA  We are unable to find this patient in our records. Please make sure that you have entered the information exactly as it appears on the patient's insurance card. If you believe this is an error, please call customer service at [phone].  Calling pt for ins update: LM [id number]/29-jhb

## 2021-03-24 NOTE — Telephone Encounter (Signed)
Spoke with pt and [facility].  The Trulicity does not need a PA = they were trying to fill it too soon - due date is 03/27/21. All other meds are there ready for pick up - he will get them today.

## 2021-03-24 NOTE — Telephone Encounter (Signed)
Pt said he was rc for [employer]

## 2021-03-31 NOTE — Telephone Encounter (Signed)
Can you call patient and schedule him with me (telephone is fine) Purpose: to TRY & get freestyle libre monitor for his arm (not a guarantee but will submit)

## 2021-04-06 ENCOUNTER — Telehealth: Payer: Self-pay

## 2021-04-06 NOTE — Telephone Encounter (Signed)
I have scheduled patient for 05/06/2021. Pt states that he can not afford the refills for Merrilee Seashore, [place], [place], [place] [address] Direct Dial: [phone] [email or web address] Website: [email or web address]

## 2021-04-06 NOTE — Chronic Care Management (AMB) (Signed)
  Care Management   Note  04/06/2021 Name: CANDON CARAS MRN: [id number] DOB: 12-08-1960  Arty Baumgartner Labree is a 60 y.o. year old male who is a primary care patient of Sharion Balloon, FNP. I reached out to [employer] by phone today in response to a referral sent by Mr. Arty Baumgartner Macbeth's primary care provider.   Mr. Marchi was given information about care management services today including:  Care management services include personalized support from designated clinical staff supervised by his physician, including individualized plan of care and coordination with other care providers 24/7 contact phone numbers for assistance for urgent and routine care needs. The patient may stop care management services at any time by phone call to the office staff.  Patient agreed to services and verbal consent obtained.   Follow up plan: Telephone appointment with care management team member scheduled for:05/06/2021  Noreene Larsson, [place], [place], [place] [address] Direct Dial: [phone] [email or web address] Website: [email or web address]

## 2021-05-06 ENCOUNTER — Ambulatory Visit (INDEPENDENT_AMBULATORY_CARE_PROVIDER_SITE_OTHER): Payer: Commercial Managed Care - PPO | Admitting: Pharmacist

## 2021-05-06 DIAGNOSIS — E1169 Type 2 diabetes mellitus with other specified complication: Secondary | ICD-10-CM | POA: Diagnosis not present

## 2021-05-11 ENCOUNTER — Telehealth: Payer: Self-pay | Admitting: Pharmacist

## 2021-05-11 DIAGNOSIS — E1122 Type 2 diabetes mellitus with diabetic chronic kidney disease: Secondary | ICD-10-CM

## 2021-05-11 MED ORDER — FREESTYLE LIBRE 3 SENSOR MISC: 1.0000 | 11 refills | Status: DC

## 2021-05-11 NOTE — Telephone Encounter (Signed)
FS LIBRE 3 SENSORS CALLED IN TO [facility] PER PATIENT REQUEST

## 2021-05-11 NOTE — Progress Notes (Signed)
    05/06/2021 Name: [name] [name] MRN: [id number] DOB: 11/03/1960   S:  65 YOM Presents for diabetes evaluation, education, and management. Patient was referred and last seen by Primary Care Provider on 02/07/21.  Patient is interested in CGM technology, however insurance has been difficult.    Insurance coverage/medication affordability: [employer]  Patient reports adherence with medications. Current diabetes medications include:   METFORMIN 408 BID; GFR 81 TRULICITY 1.5MG  SQ WEEKLY JARDIANCE 25mg  daily Current hypertension medications include: N/A (ACEi was stopped at discharge in 02/2021) Goal 130/80 Current hyperlipidemia medications include: ROSUVASTATIN; LDL 77 --PUSH DOSE   Patient denies hypoglycemic events.   Discussed meal planning options and Plate method for healthy eating Avoid sugary drinks and desserts Incorporate balanced protein, non starchy veggies, 1 serving of carbohydrate with each meal Increase water intake Increase physical activity as able   Patient-reported exercise habits: N/.A   O:  Lab Results  Component Value Date   HGBA1C 7.1 (H) 02/07/2021     Lipid Panel     Component Value Date/Time   CHOL 154 02/07/2021 1006   TRIG 200 (H) 02/07/2021 1006   HDL 44 02/07/2021 1006   CHOLHDL 3.5 02/07/2021 1006   CHOLHDL 5 05/04/2016 0806   VLDL 50.6 (H) 05/04/2016 0806   LDLCALC 77 02/07/2021 1006   LDLDIRECT 118.0 05/04/2016 0806     Home fasting blood sugars:  WOULD LIKE A LIBRE TO ASSIST WITH CONTINUOUS GLUCOSE MONITORING   2 hour post-meal/random blood sugars: N/A.    Clinical Atherosclerotic Cardiovascular Disease (ASCVD): No   The 10-year ASCVD risk score (Arnett DK, et al., 2019) is: 17.3%   Values used to calculate the score:     Age: 60 years     Sex: Male     Is Non-Hispanic African American: No     Diabetic: Yes     Tobacco smoker: No     Systolic Blood Pressure: 144 mmHg     Is BP treated: No     HDL Cholesterol:  44 mg/dL     Total Cholesterol: 154 mg/dL    A/P:  Diabetes T2DM currently UNCONTROLLED. Patient is adherent with medication.   -CONTINUE MEDICATIONS AS PRESCRIBED  METFORMIN GFR 81  TRULICITY 1.5MG  SQ WEEKLY JARIDANCE 25mg  daily  -LIBRE 2 WAS CALLED IN PREVIOUSLY HOWEVER INSURANCE REFUSED TO PAY   -CALLED IN LIBRE 3 (MUST USE SMART PHONE; no reader available with libre 3)   VOUCHER GIVEN TO PATIENT (HE KNOWS TO CALL [phone]) AND COST WILL BE RIGHT AT $75 MONTH FOR 2 SENSORS  LIBRE 3 DOES NOT REQUIRE PATIENT TO SCAN  BLOOD SUGAR UPDATES REAL TIME EVERY MINUTE  Must get at chain stores most likely for voucher to work--sent to [facility] [place]  -Extensively discussed pathophysiology of diabetes, recommended lifestyle interventions, dietary effects on blood sugar control  -Counseled on s/sx of and management of hypoglycemia    Written patient instructions provided.  Total time in counseling 15 minutes.     Regina Eck, PharmD, BCPS Clinical Pharmacist, [place]  II Phone [phone]

## 2021-06-22 ENCOUNTER — Encounter: Payer: Self-pay | Admitting: Family

## 2021-06-23 ENCOUNTER — Other Ambulatory Visit: Payer: Self-pay | Admitting: *Deleted

## 2021-06-23 DIAGNOSIS — E1165 Type 2 diabetes mellitus with hyperglycemia: Secondary | ICD-10-CM

## 2021-06-23 MED ORDER — EMPAGLIFLOZIN 25 MG PO TABS: 25.0000 mg | ORAL_TABLET | Freq: Every day | ORAL | 0 refills | Status: DC

## 2021-06-23 MED ORDER — METFORMIN HCL 500 MG PO TABS: 500.0000 mg | ORAL_TABLET | Freq: Two times a day (BID) | ORAL | 0 refills | Status: DC

## 2021-07-28 ENCOUNTER — Encounter: Payer: Self-pay | Admitting: Internal Medicine

## 2021-08-11 ENCOUNTER — Ambulatory Visit (INDEPENDENT_AMBULATORY_CARE_PROVIDER_SITE_OTHER): Payer: Commercial Managed Care - PPO | Admitting: Family

## 2021-08-11 ENCOUNTER — Encounter: Payer: Self-pay | Admitting: Family

## 2021-08-11 VITALS — BP 134/91 | HR 84 | Temp 97.3°F | Ht 72.0 in | Wt 265.0 lb

## 2021-08-11 DIAGNOSIS — E1169 Type 2 diabetes mellitus with other specified complication: Secondary | ICD-10-CM | POA: Diagnosis not present

## 2021-08-11 DIAGNOSIS — N401 Enlarged prostate with lower urinary tract symptoms: Secondary | ICD-10-CM | POA: Diagnosis not present

## 2021-08-11 DIAGNOSIS — J4520 Mild intermittent asthma, uncomplicated: Secondary | ICD-10-CM | POA: Diagnosis not present

## 2021-08-11 DIAGNOSIS — R3914 Feeling of incomplete bladder emptying: Secondary | ICD-10-CM

## 2021-08-11 DIAGNOSIS — E785 Hyperlipidemia, unspecified: Secondary | ICD-10-CM

## 2021-08-11 DIAGNOSIS — K219 Gastro-esophageal reflux disease without esophagitis: Secondary | ICD-10-CM

## 2021-08-11 DIAGNOSIS — Z23 Encounter for immunization: Secondary | ICD-10-CM | POA: Diagnosis not present

## 2021-08-11 DIAGNOSIS — Z1211 Encounter for screening for malignant neoplasm of colon: Secondary | ICD-10-CM

## 2021-08-11 DIAGNOSIS — G8929 Other chronic pain: Secondary | ICD-10-CM

## 2021-08-11 DIAGNOSIS — M545 Low back pain, unspecified: Secondary | ICD-10-CM

## 2021-08-11 LAB — BAYER DCA HB A1C WAIVED: HB A1C (BAYER DCA - WAIVED): 7.4 % — ABNORMAL HIGH (ref 4.8–5.6)

## 2021-08-11 LAB — CBC WITH DIFFERENTIAL/PLATELET

## 2021-08-11 NOTE — Progress Notes (Signed)
Subjective:    Patient ID: [name] [name], male    DOB: February 27, 1961, 61 y.o.   MRN: [id number]  Chief Complaint  Patient presents with   Medical Management of Chronic Issues   Pt presents to the office today for chronic follow up. He had for Spinal Cord stimulator. He states this has been life changing.  Asthma There is no cough, sputum production or wheezing. This is a chronic problem. The current episode started more than 1 year ago. The problem occurs intermittently. The problem has been waxing and waning. Associated symptoms include heartburn. His symptoms are alleviated by rest. He reports moderate improvement on treatment. His past medical history is significant for asthma.  Gastroesophageal Reflux He complains of belching and heartburn. He reports no coughing or no wheezing. This is a chronic problem. The current episode started more than 1 year ago. The problem occurs rarely. The problem has been waxing and waning. He has tried a PPI for the symptoms. The treatment provided moderate relief.  Diabetes He presents for his follow-up diabetic visit. He has type 2 diabetes mellitus. Pertinent negatives for diabetes include no blurred vision and no foot paresthesias. Symptoms are stable. Pertinent negatives for diabetic complications include no heart disease. Risk factors for coronary artery disease include diabetes mellitus, hypertension, male sex and dyslipidemia. He is following a generally healthy diet. His overall blood glucose range is 140-180 mg/dl. Eye exam is current.  Back Pain This is a chronic problem. The current episode started more than 1 year ago. The problem occurs intermittently. The problem has been waxing and waning since onset. The pain is present in the lumbar spine. The quality of the pain is described as aching. The pain is at a severity of 2/10. The pain is mild. Risk factors include obesity. The treatment provided moderate relief.  Benign Prostatic Hypertrophy This  is a chronic problem. The current episode started more than 1 year ago. Irritative symptoms include nocturia (1-2 some times).  Hyperlipidemia This is a chronic problem. The current episode started more than 1 year ago. Exacerbating diseases include obesity. Current antihyperlipidemic treatment includes statins. The current treatment provides moderate improvement of lipids.     Review of Systems  Eyes:  Negative for blurred vision.  Respiratory:  Negative for cough, sputum production and wheezing.   Gastrointestinal:  Positive for heartburn.  Genitourinary:  Positive for nocturia (1-2 some times).  Musculoskeletal:  Positive for back pain.  All other systems reviewed and are negative.     Objective:   Physical Exam Vitals reviewed.  Constitutional:      General: He is not in acute distress.    Appearance: He is well-developed. He is obese.  HENT:     Head: Normocephalic.     Right Ear: Tympanic membrane normal.     Left Ear: Tympanic membrane normal.  Eyes:     General:        Right eye: No discharge.        Left eye: No discharge.     Pupils: Pupils are equal, round, and reactive to light.  Neck:     Thyroid: No thyromegaly.  Cardiovascular:     Rate and Rhythm: Normal rate and regular rhythm.     Heart sounds: Normal heart sounds. No murmur heard. Pulmonary:     Effort: Pulmonary effort is normal. No respiratory distress.     Breath sounds: Normal breath sounds. No wheezing.  Abdominal:     General: Bowel sounds are  normal. There is no distension.     Palpations: Abdomen is soft.     Tenderness: There is no abdominal tenderness.  Musculoskeletal:        General: No tenderness. Normal range of motion.     Cervical back: Normal range of motion and neck supple.  Skin:    General: Skin is warm and dry.     Findings: No erythema or rash.  Neurological:     Mental Status: He is alert and oriented to person, place, and time.     Cranial Nerves: No cranial nerve deficit.      Deep Tendon Reflexes: Reflexes are normal and symmetric.  Psychiatric:        Behavior: Behavior normal.        Thought Content: Thought content normal.        Judgment: Judgment normal.      BP (!) 134/91    Pulse 84    Temp (!) 97.3 F (36.3 C) (Temporal)    Ht 6' (1.829 m)    Wt 265 lb (120.2 kg)    BMI 35.94 kg/m      Assessment & Plan:  Arty Baumgartner Goodlin comes in today with chief complaint of Medical Management of Chronic Issues   Diagnosis and orders addressed:  1. Gastroesophageal reflux disease, unspecified whether esophagitis present - CMP14+EGFR - CBC with Differential/Platelet  2. Mild intermittent asthma, unspecified whether complicated - DSW97+VNRW - CBC with Differential/Platelet  3. Type 2 diabetes mellitus with other specified complication, without long-term current use of insulin (HCC) - Bayer DCA Hb A1c Waived - CMP14+EGFR - CBC with Differential/Platelet - Microalbumin / creatinine urine ratio  4. Benign prostatic hyperplasia with incomplete bladder emptying - CMP14+EGFR - CBC with Differential/Platelet  5. Chronic low back pain, unspecified back pain laterality, unspecified whether sciatica present - CMP14+EGFR - CBC with Differential/Platelet  6. Morbid obesity ([place]) - CMP14+EGFR - CBC with Differential/Platelet  7. Dyslipidemia - CMP14+EGFR - CBC with Differential/Platelet - Lipid panel  8. Colon cancer screening - Ambulatory referral to Gastroenterology - CMP14+EGFR - CBC with Differential/Platelet   Labs pending Health Maintenance reviewed Diet and exercise encouraged  Follow up plan: 6 months    Evelina Dun, FNP

## 2021-08-11 NOTE — Patient Instructions (Signed)

## 2021-08-12 LAB — CMP14+EGFR
ALT: 32 IU/L (ref 0–44)
AST: 20 IU/L (ref 0–40)
Albumin/Globulin Ratio: 2.4 — ABNORMAL HIGH (ref 1.2–2.2)
Albumin: 5 g/dL — ABNORMAL HIGH (ref 3.8–4.8)
Alkaline Phosphatase: 70 IU/L (ref 44–121)
BUN/Creatinine Ratio: 20 (ref 10–24)
BUN: 20 mg/dL (ref 8–27)
Bilirubin Total: 0.6 mg/dL (ref 0.0–1.2)
CO2: 21 mmol/L (ref 20–29)
Calcium: 9.6 mg/dL (ref 8.6–10.2)
Chloride: 103 mmol/L (ref 96–106)
Creatinine, Ser: 1 mg/dL (ref 0.76–1.27)
Globulin, Total: 2.1 g/dL (ref 1.5–4.5)
Glucose: 121 mg/dL — ABNORMAL HIGH (ref 70–99)
Potassium: 5.1 mmol/L (ref 3.5–5.2)
Sodium: 137 mmol/L (ref 134–144)
Total Protein: 7.1 g/dL (ref 6.0–8.5)
eGFR: 86 mL/min/{1.73_m2} (ref >59)

## 2021-08-12 LAB — CBC WITH DIFFERENTIAL/PLATELET
Basophils Absolute: 0.1 10*3/uL (ref 0.0–0.2)
Basos: 2 %
EOS (ABSOLUTE): 0.2 10*3/uL (ref 0.0–0.4)
Eos: 4 %
Hematocrit: 51.4 % — ABNORMAL HIGH (ref 37.5–51.0)
Hemoglobin: 17.3 g/dL (ref 13.0–17.7)
Immature Grans (Abs): 0 10*3/uL (ref 0.0–0.1)
Immature Granulocytes: 0 %
Lymphocytes Absolute: 1.4 10*3/uL (ref 0.7–3.1)
Lymphs: 24 %
MCH: 29.6 pg (ref 26.6–33.0)
MCHC: 33.7 g/dL (ref 31.5–35.7)
MCV: 88 fL (ref 79–97)
Monocytes Absolute: 0.4 10*3/uL (ref 0.1–0.9)
Monocytes: 8 %
Neutrophils Absolute: 3.5 10*3/uL (ref 1.4–7.0)
Neutrophils: 62 %
Platelets: 180 10*3/uL (ref 150–450)
RBC: 5.85 x10E6/uL — ABNORMAL HIGH (ref 4.14–5.80)
RDW: 13.3 % (ref 11.6–15.4)
WBC: 5.6 10*3/uL (ref 3.4–10.8)

## 2021-08-12 LAB — LIPID PANEL
Chol/HDL Ratio: 3.1 ratio (ref 0.0–5.0)
Cholesterol, Total: 134 mg/dL (ref 100–199)
HDL: 43 mg/dL (ref >39)
LDL Chol Calc (NIH): 59 mg/dL (ref 0–99)
Triglycerides: 197 mg/dL — ABNORMAL HIGH (ref 0–149)
VLDL Cholesterol Cal: 32 mg/dL (ref 5–40)

## 2021-08-12 LAB — MICROALBUMIN / CREATININE URINE RATIO
Creatinine, Urine: 100.6 mg/dL
Microalb/Creat Ratio: 14 mg/g creat (ref 0–29)
Microalbumin, Urine: 13.8 ug/mL

## 2021-08-31 LAB — HM DIABETES EYE EXAM

## 2021-09-08 ENCOUNTER — Other Ambulatory Visit: Payer: Self-pay

## 2021-09-08 ENCOUNTER — Ambulatory Visit (AMBULATORY_SURGERY_CENTER): Payer: Commercial Managed Care - PPO | Admitting: *Deleted

## 2021-09-08 VITALS — Ht 72.0 in | Wt 265.0 lb

## 2021-09-08 MED ORDER — NA SULFATE-K SULFATE-MG SULF 17.5-3.13-1.6 GM/177ML PO SOLN: 1.0000 | Freq: Once | ORAL | 0 refills | Status: AC

## 2021-09-08 NOTE — Progress Notes (Signed)

## 2021-09-22 ENCOUNTER — Encounter: Payer: Commercial Managed Care - PPO | Admitting: Internal Medicine

## 2021-09-23 ENCOUNTER — Other Ambulatory Visit: Payer: Self-pay | Admitting: *Deleted

## 2021-09-23 DIAGNOSIS — N401 Enlarged prostate with lower urinary tract symptoms: Secondary | ICD-10-CM

## 2021-09-23 DIAGNOSIS — E1165 Type 2 diabetes mellitus with hyperglycemia: Secondary | ICD-10-CM

## 2021-09-23 DIAGNOSIS — J301 Allergic rhinitis due to pollen: Secondary | ICD-10-CM

## 2021-09-23 MED ORDER — METFORMIN HCL 500 MG PO TABS: 500.0000 mg | ORAL_TABLET | Freq: Two times a day (BID) | ORAL | 1 refills | Status: DC

## 2021-09-23 MED ORDER — MONTELUKAST SODIUM 10 MG PO TABS: 10.0000 mg | ORAL_TABLET | Freq: Every day | ORAL | 1 refills | Status: DC

## 2021-09-23 MED ORDER — TAMSULOSIN HCL 0.4 MG PO CAPS
0.8000 mg | ORAL_CAPSULE | Freq: Every day | ORAL | 1 refills | Status: DC
Start: 2021-09-23 — End: 2022-03-31

## 2021-09-28 ENCOUNTER — Other Ambulatory Visit: Payer: Self-pay | Admitting: *Deleted

## 2021-09-28 DIAGNOSIS — E1165 Type 2 diabetes mellitus with hyperglycemia: Secondary | ICD-10-CM

## 2021-09-28 MED ORDER — TRULICITY 1.5 MG/0.5ML ~~LOC~~ SOAJ: 1.5000 mg | SUBCUTANEOUS | 1 refills | Status: DC

## 2021-09-28 MED ORDER — EMPAGLIFLOZIN 25 MG PO TABS: 25.0000 mg | ORAL_TABLET | Freq: Every day | ORAL | 1 refills | Status: DC

## 2021-10-18 ENCOUNTER — Encounter: Payer: Self-pay | Admitting: Internal Medicine

## 2021-10-24 DIAGNOSIS — C801 Malignant (primary) neoplasm, unspecified: Secondary | ICD-10-CM

## 2021-10-24 HISTORY — DX: Malignant (primary) neoplasm, unspecified: C80.1

## 2021-10-27 ENCOUNTER — Telehealth: Payer: Self-pay

## 2021-10-27 ENCOUNTER — Ambulatory Visit (AMBULATORY_SURGERY_CENTER): Payer: Commercial Managed Care - PPO | Admitting: Internal Medicine

## 2021-10-27 ENCOUNTER — Other Ambulatory Visit: Payer: Self-pay

## 2021-10-27 ENCOUNTER — Encounter: Payer: Self-pay | Admitting: Internal Medicine

## 2021-10-27 VITALS — BP 110/65 | HR 82 | Temp 97.8°F | Resp 22 | Ht 72.0 in | Wt 265.0 lb

## 2021-10-27 DIAGNOSIS — Z86010 Personal history of colonic polyps: Secondary | ICD-10-CM

## 2021-10-27 DIAGNOSIS — C19 Malignant neoplasm of rectosigmoid junction: Secondary | ICD-10-CM | POA: Diagnosis not present

## 2021-10-27 DIAGNOSIS — Z800 Family history of malignant neoplasm of digestive organs: Secondary | ICD-10-CM | POA: Diagnosis present

## 2021-10-27 DIAGNOSIS — D120 Benign neoplasm of cecum: Secondary | ICD-10-CM | POA: Diagnosis not present

## 2021-10-27 DIAGNOSIS — K6389 Other specified diseases of intestine: Secondary | ICD-10-CM

## 2021-10-27 MED ORDER — SODIUM CHLORIDE 0.9 % IV SOLN: 500.0000 mL | Freq: Once | INTRAVENOUS | Status: DC

## 2021-10-27 NOTE — Progress Notes (Signed)
Pt's states no medical or surgical changes since previsit or office visit. 

## 2021-10-27 NOTE — Patient Instructions (Signed)
Handouts on polyps and diverticulosis given. ? ?Contrast X 2 bottles/instructions given to patient.  Informed Dr. Vena Rua office will call to schedule CT scan and referrals. ? ?YOU HAD AN ENDOSCOPIC PROCEDURE TODAY AT [facility]:   Refer to the procedure report that was given to you for any specific questions about what was found during the examination.  If the procedure report does not answer your questions, please call your gastroenterologist to clarify.  If you requested that your care partner not be given the details of your procedure findings, then the procedure report has been included in a sealed envelope for you to review at your convenience later. ? ?YOU SHOULD EXPECT: Some feelings of bloating in the abdomen. Passage of more gas than usual.  Walking can help get rid of the air that was put into your GI tract during the procedure and reduce the bloating. If you had a lower endoscopy (such as a colonoscopy or flexible sigmoidoscopy) you may notice spotting of blood in your stool or on the toilet paper. If you underwent a bowel prep for your procedure, you may not have a normal bowel movement for a few days. ? ?Please Note:  You might notice some irritation and congestion in your nose or some drainage.  This is from the oxygen used during your procedure.  There is no need for concern and it should clear up in a day or so. ? ?SYMPTOMS TO REPORT IMMEDIATELY: ? ?Following lower endoscopy (colonoscopy or flexible sigmoidoscopy): ? Excessive amounts of blood in the stool ? Significant tenderness or worsening of abdominal pains ? Swelling of the abdomen that is new, acute ? Fever of 100?F or higher ? ?For urgent or emergent issues, a gastroenterologist can be reached at any hour by calling [phone]. ?Do not use MyChart messaging for urgent concerns.  ? ? ?DIET:  We do recommend a small meal at first, but then you may proceed to your regular diet.  Drink plenty of fluids but you should avoid  alcoholic beverages for 24 hours. ? ?ACTIVITY:  You should plan to take it easy for the rest of today and you should NOT DRIVE or use heavy machinery until tomorrow (because of the sedation medicines used during the test).   ? ?FOLLOW UP: ?Our staff will call the number listed on your records 48-72 hours following your procedure to check on you and address any questions or concerns that you may have regarding the information given to you following your procedure. If we do not reach you, we will leave a message.  We will attempt to reach you two times.  During this call, we will ask if you have developed any symptoms of COVID 19. If you develop any symptoms (ie: fever, flu-like symptoms, shortness of breath, cough etc.) before then, please call [phone].  If you test positive for Covid 19 in the 2 weeks post procedure, please call and report this information to [place].   ? ?If any biopsies were taken you will be contacted by phone or by letter within the next 1-3 weeks.  Please call us at [phone] if you have not heard about the biopsies in 3 weeks.  ? ? ?SIGNATURES/CONFIDENTIALITY: ?You and/or your care partner have signed paperwork which will be entered into your electronic medical record.  These signatures attest to the fact that that the information above on your After Visit Summary has been reviewed and is understood.  Full responsibility of the confidentiality of this  discharge information lies with you and/or your care-partner.  ?

## 2021-10-27 NOTE — Progress Notes (Signed)
A/ox3, pleased with MAC, report to RN 

## 2021-10-27 NOTE — Telephone Encounter (Signed)
CT of CAP ordered, rad scheduling will contact pt with appt date and time. ?

## 2021-10-27 NOTE — Op Note (Signed)
[facility] ?Patient Name: [name] Raisch ?Procedure Date: 10/27/2021 9:24 AM ?MRN: [id number] ?Endoscopist: Jerene Bears , MD ?Age: 61 ?Referring MD:  ?Date of Birth: 1961/02/06 ?Gender: Male ?Account #: [id number] ?Procedure:                Colonoscopy ?Indications:              High risk colon cancer surveillance: Personal  ?                          history of non-advanced adenoma (2011 with Dr.  Marland Kitchen                          Rourk), Family history of colon cancer in multiple  ?                          second-degree relatives ?Medicines:                Monitored Anesthesia Care ?Procedure:                Pre-Anesthesia Assessment: ?                          - Prior to the procedure, a History and Physical  ?                          was performed, and patient medications and  ?                          allergies were reviewed. The patient's tolerance of  ?                          previous anesthesia was also reviewed. The risks  ?                          and benefits of the procedure and the sedation  ?                          options and risks were discussed with the patient.  ?                          All questions were answered, and informed consent  ?                          was obtained. Prior Anticoagulants: The patient has  ?                          taken no previous anticoagulant or antiplatelet  ?                          agents. ASA Grade Assessment: II - A patient with  ?                          mild systemic disease. After reviewing the risks  ?  and benefits, the patient was deemed in  ?                          satisfactory condition to undergo the procedure. ?                          After obtaining informed consent, the colonoscope  ?                          was passed under direct vision. Throughout the  ?                          procedure, the patient's blood pressure, pulse, and  ?                          oxygen saturations were monitored  continuously. The  ?                          CF [id number] #[id number] was introduced through the anus  ?                          and advanced to the cecum, identified by  ?                          appendiceal orifice and ileocecal valve. The  ?                          colonoscopy was performed without difficulty. The  ?                          patient tolerated the procedure well. The quality  ?                          of the bowel preparation was good. The ileocecal  ?                          valve, appendiceal orifice, and rectum were  ?                          photographed. ?Scope In: 9:41:33 AM ?Scope Out: 10:15:20 AM ?Scope Withdrawal Time: 0 hours 30 minutes 36 seconds  ?Total Procedure Duration: 0 hours 33 minutes 47 seconds  ?Findings:                 The digital rectal exam was normal. ?                          A 2 mm polyp was found in the cecum. The polyp was  ?                          sessile. The polyp was removed with a cold biopsy  ?                          forceps. Resection and retrieval were complete. ?  A 6 mm polyp was found in the cecum. The polyp was  ?                          sessile. The polyp was removed with a cold snare.  ?                          Resection and retrieval were complete. ?                          A fungating and ulcerated non-obstructing large  ?                          mass was found in the recto-sigmoid colon. The mass  ?                          was partially circumferential (involving one-half  ?                          of the lumen circumference). The mass measured 4 cm  ?                          in length. In addition, its diameter measured 4 cm.  ?                          This was biopsied with a cold forceps for  ?                          histology. Area distal to the mass was tattooed  ?                          with injections on opposite walls totalling 3 mL of  ?                          Spot (carbon black). ?                           Multiple small and large-mouthed diverticula were  ?                          found in the sigmoid colon, descending colon,  ?                          transverse colon and ascending colon. ?                          Internal hemorrhoids were found during  ?                          retroflexion. The hemorrhoids were small. ?Complications:            No immediate complications. ?Estimated Blood Loss:     Estimated blood loss was minimal. ?Impression:               - One 2 mm polyp in the cecum,  removed with a cold  ?                          biopsy forceps. Resected and retrieved. ?                          - One 6 mm polyp in the cecum, removed with a cold  ?                          snare. Resected and retrieved. ?                          - Malignant tumor in the recto-sigmoid colon.  ?                          Biopsied. Tattooed distally. ?                          - Moderate diverticulosis in the sigmoid colon, in  ?                          the descending colon, in the transverse colon and  ?                          in the ascending colon. ?                          - Internal hemorrhoids. ?Recommendation:           - Patient has a contact number available for  ?                          emergencies. The signs and symptoms of potential  ?                          delayed complications were discussed with the  ?                          patient. Return to normal activities tomorrow.  ?                          Written discharge instructions were provided to the  ?                          patient. ?                          - Resume previous diet. ?                          - Continue present medications. ?                          - Await pathology results. ?                          - Perform a CT scan (computed  tomography) of chest  ?                          with contrast, abdomen with contrast and pelvis  ?                          with contrast at appointment to be scheduled. ?                           - Referral to colorectal surgery and medical  ?                          oncology. ?                          - Repeat colonoscopy in 1 year for surveillance. ?Jerene Bears, MD ?10/27/2021 10:23:25 AM ?This report has been signed electronically. ?

## 2021-10-27 NOTE — Progress Notes (Signed)
? ?GASTROENTEROLOGY PROCEDURE H&P NOTE  ? ?Primary Care Physician: ?Sharion Balloon, FNP ? ? ? ?Reason for Procedure:  Family history of colon cancer in patient's mother, remote history of tubular adenoma in 2011 at colonoscopy with Dr. Gala Romney ? ?Plan:    Colonoscopy ? ?Patient is appropriate for endoscopic procedure(s) in the ambulatory ([facility]) setting. ? ?The nature of the procedure, as well as the risks, benefits, and alternatives were carefully and thoroughly reviewed with the patient. Ample time for discussion and questions allowed. The patient understood, was satisfied, and agreed to proceed.  ? ? ? ?HPI: ?[name] [name] is a 61 y.o. male who presents for colonoscopy.  Medical history as below.  Tolerated the prep.  No recent chest pain or shortness of breath.  No abdominal pain today. ? ?Past Medical History:  ?Diagnosis Date  ? Allergy   ? SEASONAL  ? Arthritis   ? Asthma   ? from hayfever  ? Blood in stool   ? Diabetes mellitus without complication ([place]) 50/93/2671  ? type 2  ? GERD (gastroesophageal reflux disease)   ? Hay fever   ? Hx of colonic polyps   ? Migraines   ? PONV (postoperative nausea and vomiting)   ? also history of motion sickness  ? ? ?Past Surgical History:  ?Procedure Laterality Date  ? ANTERIOR CERVICAL DECOMP/DISCECTOMY FUSION  02/20/2012  ? Procedure: ANTERIOR CERVICAL DECOMPRESSION/DISCECTOMY FUSION 1 LEVEL;  Surgeon: Erline Levine, MD;  Location: [facility];  Service: Neurosurgery;  Laterality: N/A;  Cervical six - seven  Anterior cervical decompression/diskectomy/fusion  ? APPENDECTOMY  1984  ? KNEE ARTHROSCOPY    ? right  ? NASAL SEPTUM SURGERY  2008  ? SPINAL CORD STIMULATOR INSERTION N/A 03/10/2021  ? Procedure: SPINAL CORD STIMULATOR PLACEMENT;  Surgeon: Melina Schools, MD;  Location: [facility];  Service: Orthopedics;  Laterality: N/A;  2.5 HRS ?3C-BED  ? ? ?Prior to Admission medications   ?Medication Sig Start Date End Date Taking? Authorizing Provider  ?empagliflozin  (JARDIANCE) 25 MG TABS tablet Take 1 tablet (25 mg total) by mouth daily before breakfast. 09/28/21  Yes Sharion Balloon, FNP  ?glucose blood test strip True Test Strips. Check blood sugars three times per day. E11.65. 05/15/16  Yes Burchette, Alinda Sierras, MD  ?metFORMIN (GLUCOPHAGE) 500 MG tablet Take 1 tablet (500 mg total) by mouth 2 (two) times daily with a meal. 09/23/21  Yes Sharion Balloon, FNP  ?montelukast (SINGULAIR) 10 MG tablet Take 1 tablet (10 mg total) by mouth at bedtime. 09/23/21  Yes Sharion Balloon, FNP  ?rosuvastatin (CRESTOR) 10 MG tablet Take 1 tablet (10 mg total) by mouth daily. 03/21/21  Yes Hawks, Alyse Low A, FNP  ?tamsulosin (FLOMAX) 0.4 MG CAPS capsule Take 2 capsules (0.8 mg total) by mouth daily. 09/23/21  Yes Sharion Balloon, FNP  ?TRUEplus Lancets 33G MISC True Lancets. Check Blood Sugars Three times  Per Day. DX: E11.65 08/09/20  Yes Hawks, Alyse Low A, FNP  ?albuterol (VENTOLIN HFA) 108 (90 Base) MCG/ACT inhaler Inhale 2 puffs into the lungs every 6 (six) hours as needed for wheezing or shortness of breath. ?Patient not taking: Reported on 09/08/2021 08/09/20   Sharion Balloon, FNP  ?beclomethasone (QVAR REDIHALER) 80 MCG/ACT inhaler Inhale 2 puffs into the lungs 2 (two) times daily. ?Patient not taking: Reported on 09/08/2021 08/10/20   Sharion Balloon, FNP  ?Continuous Blood Gluc Sensor (FREESTYLE LIBRE 3 SENSOR) MISC 1 each by Does not apply route  every 14 (fourteen) days. Place 1 sensor on the skin every 14 days. Use to check glucose continuously 05/11/21   Sharion Balloon, FNP  ?Dulaglutide (TRULICITY) 1.5 XN/2.3FT SOPN Inject 1.5 mg into the skin once a week. 09/28/21   Sharion Balloon, FNP  ?fluticasone (FLONASE) 50 MCG/ACT nasal spray Place 2 sprays into both nostrils daily. ?Patient taking differently: Place 2 sprays into both nostrils as needed. 03/21/21   Sharion Balloon, FNP  ?levocetirizine (XYZAL) 5 MG tablet Take 1 tablet (5 mg total) by mouth every evening. ?Patient taking  differently: Take 5 mg by mouth daily as needed for allergies. 08/09/20   Sharion Balloon, FNP  ?Naproxen Sodium (ALEVE PO) Take by mouth 2 (two) times daily. TAKE TWO TWICE A DAY    [provider]  ?omeprazole (PRILOSEC) 40 MG capsule Take 1 capsule (40 mg total) by mouth daily as needed. For acid reflux 03/21/21   Sharion Balloon, FNP  ? ? ?Current Outpatient Medications  ?Medication Sig Dispense Refill  ? empagliflozin (JARDIANCE) 25 MG TABS tablet Take 1 tablet (25 mg total) by mouth daily before breakfast. 90 tablet 1  ? glucose blood test strip True Test Strips. Check blood sugars three times per day. E11.65. 300 each 3  ? metFORMIN (GLUCOPHAGE) 500 MG tablet Take 1 tablet (500 mg total) by mouth 2 (two) times daily with a meal. 180 tablet 1  ? montelukast (SINGULAIR) 10 MG tablet Take 1 tablet (10 mg total) by mouth at bedtime. 90 tablet 1  ? rosuvastatin (CRESTOR) 10 MG tablet Take 1 tablet (10 mg total) by mouth daily. 90 tablet 1  ? tamsulosin (FLOMAX) 0.4 MG CAPS capsule Take 2 capsules (0.8 mg total) by mouth daily. 180 capsule 1  ? TRUEplus Lancets 33G MISC True Lancets. Check Blood Sugars Three times  Per Day. DX: E11.65 300 each 3  ? albuterol (VENTOLIN HFA) 108 (90 Base) MCG/ACT inhaler Inhale 2 puffs into the lungs every 6 (six) hours as needed for wheezing or shortness of breath. (Patient not taking: Reported on 09/08/2021) 18 g 2  ? beclomethasone (QVAR REDIHALER) 80 MCG/ACT inhaler Inhale 2 puffs into the lungs 2 (two) times daily. (Patient not taking: Reported on 09/08/2021) 1 each 2  ? Continuous Blood Gluc Sensor (FREESTYLE LIBRE 3 SENSOR) MISC 1 each by Does not apply route every 14 (fourteen) days. Place 1 sensor on the skin every 14 days. Use to check glucose continuously 2 each 11  ? Dulaglutide (TRULICITY) 1.5 DD/2.2GU SOPN Inject 1.5 mg into the skin once a week. 6 mL 1  ? fluticasone (FLONASE) 50 MCG/ACT nasal spray Place 2 sprays into both nostrils daily. (Patient taking  differently: Place 2 sprays into both nostrils as needed.) 48 g 1  ? levocetirizine (XYZAL) 5 MG tablet Take 1 tablet (5 mg total) by mouth every evening. (Patient taking differently: Take 5 mg by mouth daily as needed for allergies.) 90 tablet 1  ? Naproxen Sodium (ALEVE PO) Take by mouth 2 (two) times daily. TAKE TWO TWICE A DAY    ? omeprazole (PRILOSEC) 40 MG capsule Take 1 capsule (40 mg total) by mouth daily as needed. For acid reflux 90 capsule 1  ? ?Current Facility-Administered Medications  ?Medication Dose Route Frequency Provider Last Rate Last Admin  ? 0.9 %  sodium chloride infusion  500 mL Intravenous Once Buel Molder, Lajuan Lines, MD      ? ? ?Allergies as of 10/27/2021 - Review Complete 10/27/2021  ?  Allergen Reaction Noted  ? Other Swelling 10/28/2019  ? ? ?Family History  ?Problem Relation Age of Onset  ? Colon cancer Mother   ? Cancer Mother   ?     colon, breast   ? Diabetes Mother   ? Diabetes Father   ?     type II  ? Diabetes Brother   ? Colon cancer Maternal Aunt   ? Colon cancer Maternal Uncle   ? Cancer Maternal Uncle   ?     colon  ? Colon cancer Maternal Uncle   ? Cancer Maternal Uncle   ? Colon cancer Maternal Uncle   ? Cancer Maternal Uncle   ? Colon cancer Maternal Uncle   ? Cancer Maternal Uncle   ? Colon cancer Maternal Uncle   ? Cancer Maternal Uncle   ? Colon cancer Maternal Uncle   ? Cancer Maternal Uncle   ? Colon cancer Maternal Grandmother   ? Cancer Maternal Grandmother   ?     breast, colon   ? Diabetes Paternal Grandfather   ? Esophageal cancer Neg Hx   ? Rectal cancer Neg Hx   ? Stomach cancer Neg Hx   ? ? ?Social History  ? ?Socioeconomic History  ? Marital status: Married  ?  Spouse name: Not on file  ? Number of children: Not on file  ? Years of education: Not on file  ? Highest education level: Not on file  ?Occupational History  ? Not on file  ?Tobacco Use  ? Smoking status: Former  ?  Packs/day: 1.00  ?  Types: Cigarettes  ?  Passive exposure: Never  ? Smokeless tobacco: Never   ?Vaping Use  ? Vaping Use: Never used  ?Substance and Sexual Activity  ? Alcohol use: Yes  ?  Comment: occasional  ? Drug use: No  ? Sexual activity: Not on file  ?Other Topics Concern  ? Not on file  ?Social History N

## 2021-10-27 NOTE — Progress Notes (Signed)
Called to room to assist during endoscopic procedure.  Patient ID and intended procedure confirmed with present staff. Received instructions for my participation in the procedure from the performing physician.  

## 2021-10-28 ENCOUNTER — Telehealth: Payer: Self-pay

## 2021-10-28 ENCOUNTER — Other Ambulatory Visit: Payer: Self-pay

## 2021-10-28 DIAGNOSIS — K6389 Other specified diseases of intestine: Secondary | ICD-10-CM

## 2021-10-31 ENCOUNTER — Telehealth: Payer: Self-pay | Admitting: Internal Medicine

## 2021-10-31 ENCOUNTER — Encounter: Payer: Self-pay | Admitting: *Deleted

## 2021-10-31 ENCOUNTER — Telehealth: Payer: Self-pay | Admitting: *Deleted

## 2021-10-31 NOTE — Progress Notes (Signed)
PATIENT NAVIGATOR PROGRESS NOTE ? ?Name: [name] [name] ?Date: 10/31/2021 ?MRN: [id number]  ?DOB: Aug 25, 1960 ? ? ?Reason for visit:  ?Introductory phone call ? ?Comments:  Called patient for new patient appt after CT scan on 5/11.  Set up for 5/12 at 1:40 with Dr Benay Spice, reviewed directions and parking as well as mask and one support person policy. ? ?Provided contact information to call with any questions or concerns ? ? ? ?Time spent counseling/coordinating care: 15-30 minutes ? ?

## 2021-10-31 NOTE — Telephone Encounter (Signed)
I spoke to the patient and his wife by phone this afternoon ?They had additional questions about his new colon cancer diagnosis and the treatment plans. ?All questions very reasonable and we spoke for about 20 minutes. ? ?He will see Dr. Benay Spice with [facility] on Friday.  His CT scans are scheduled for Thursday. ? ?I remain available to help him in any way I can going forward ?He knows to call me back or send me a MyChart message if he has further questions or concerns ? ?They both thanked me for the call ?

## 2021-10-31 NOTE — Telephone Encounter (Signed)
On path report results you mentioned pt wanted to speak with you regarding oncology care. He is calling requesting to speak directly to you. ?

## 2021-10-31 NOTE — Telephone Encounter (Signed)
?  Follow up Call- ? ? ?  10/27/2021  ?  8:15 AM  ?Call back number  ?Post procedure Call Back phone  # [phone]  ?Permission to leave phone message Yes  ?  ? ?Patient questions: ? ?Do you have a fever, pain , or abdominal swelling? No. ?Pain Score  0 * ? ?Have you tolerated food without any problems? Yes.   ? ?Have you been able to return to your normal activities? Yes.   ? ?Do you have any questions about your discharge instructions: ?Diet   No. ?Medications  No. ?Follow up visit  No. ? ?Do you have questions or concerns about your Care? Yes.   ? ?Actions: ?* If pain score is 4 or above: ?No action needed, pain <4. ? ? ?

## 2021-10-31 NOTE — Telephone Encounter (Signed)
We received a call from patient requesting to speak with Dr. Hilarie Fredrickson. Per patient, was told if he has any questions to call. Patient states having cancer related questions, looking for options. Please advise. ?

## 2021-11-03 ENCOUNTER — Ambulatory Visit (HOSPITAL_BASED_OUTPATIENT_CLINIC_OR_DEPARTMENT_OTHER)
Admission: RE | Admit: 2021-11-03 | Discharge: 2021-11-03 | Disposition: A | Discharge status: Home / Self Care | Payer: Commercial Managed Care - PPO | Source: Ambulatory Visit | Attending: Internal Medicine | Admitting: Internal Medicine

## 2021-11-03 DIAGNOSIS — K6389 Other specified diseases of intestine: Secondary | ICD-10-CM | POA: Insufficient documentation

## 2021-11-03 LAB — POCT I-STAT CREATININE: Creatinine, Ser: 1 mg/dL (ref 0.61–1.24)

## 2021-11-03 MED ORDER — IOHEXOL 300 MG/ML  SOLN
100.0000 mL | Freq: Once | INTRAMUSCULAR | Status: AC | PRN
  Administered 2021-11-03: 100 mL via INTRAVENOUS

## 2021-11-04 ENCOUNTER — Encounter: Payer: Self-pay | Admitting: Internal Medicine

## 2021-11-04 ENCOUNTER — Inpatient Hospital Stay: Payer: Commercial Managed Care - PPO | Attending: Oncology | Admitting: Oncology

## 2021-11-04 ENCOUNTER — Encounter: Payer: Self-pay | Admitting: *Deleted

## 2021-11-04 ENCOUNTER — Encounter: Payer: Self-pay | Admitting: Oncology

## 2021-11-04 VITALS — BP 142/93 | HR 94 | Temp 98.1°F | Resp 18 | Ht 72.0 in | Wt 266.2 lb

## 2021-11-04 DIAGNOSIS — C20 Malignant neoplasm of rectum: Secondary | ICD-10-CM

## 2021-11-04 DIAGNOSIS — Z86010 Personal history of colonic polyps: Secondary | ICD-10-CM | POA: Insufficient documentation

## 2021-11-04 DIAGNOSIS — Z803 Family history of malignant neoplasm of breast: Secondary | ICD-10-CM | POA: Insufficient documentation

## 2021-11-04 DIAGNOSIS — C19 Malignant neoplasm of rectosigmoid junction: Secondary | ICD-10-CM | POA: Insufficient documentation

## 2021-11-04 DIAGNOSIS — G8929 Other chronic pain: Secondary | ICD-10-CM | POA: Insufficient documentation
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
## 2021-11-07 ENCOUNTER — Other Ambulatory Visit: Payer: Self-pay | Admitting: *Deleted

## 2021-11-07 ENCOUNTER — Telehealth: Payer: Self-pay | Admitting: Genetic Counselor

## 2021-11-07 ENCOUNTER — Encounter: Payer: Self-pay | Admitting: *Deleted

## 2021-11-07 DIAGNOSIS — C20 Malignant neoplasm of rectum: Secondary | ICD-10-CM

## 2021-11-07 NOTE — Progress Notes (Addendum)
Referral for genetics and Dr. Nadeen Landau w/[facility] placed. Demographics and chart information faxed to office. ?[facility] notified that MD has requesting MMR-IHC testing as soon as possible for Case #[id number] dated 10/27/21. Also requested [facility] testing if enough tissue is available.  ?Dx: Rectal cancer    C20 ?Stage: clinical stage IV ?MRI scans approved by insurance. Notified patient via MyChart to reach out to [facility] to schedule. ?

## 2021-11-07 NOTE — Telephone Encounter (Signed)
Scheduled appt per 5/15 referral. Pt is aware of appt date and time. Pt is aware to arrive 15 mins prior to appt time and to bring and updated insurance card. Pt is aware of appt location.   

## 2021-11-08 ENCOUNTER — Other Ambulatory Visit: Payer: Self-pay | Admitting: Genetic Counselor

## 2021-11-08 DIAGNOSIS — Z860100 Personal history of colon polyps, unspecified: Secondary | ICD-10-CM

## 2021-11-09 ENCOUNTER — Inpatient Hospital Stay (HOSPITAL_BASED_OUTPATIENT_CLINIC_OR_DEPARTMENT_OTHER): Payer: Commercial Managed Care - PPO | Admitting: Genetic Counselor

## 2021-11-09 ENCOUNTER — Other Ambulatory Visit: Payer: Self-pay

## 2021-11-09 ENCOUNTER — Inpatient Hospital Stay: Payer: Commercial Managed Care - PPO

## 2021-11-09 DIAGNOSIS — C20 Malignant neoplasm of rectum: Secondary | ICD-10-CM | POA: Insufficient documentation

## 2021-11-09 DIAGNOSIS — Z86010 Personal history of colonic polyps: Secondary | ICD-10-CM | POA: Diagnosis not present

## 2021-11-09 DIAGNOSIS — C189 Malignant neoplasm of colon, unspecified: Secondary | ICD-10-CM | POA: Insufficient documentation

## 2021-11-09 DIAGNOSIS — Z800 Family history of malignant neoplasm of digestive organs: Secondary | ICD-10-CM

## 2021-11-09 DIAGNOSIS — Z803 Family history of malignant neoplasm of breast: Secondary | ICD-10-CM | POA: Diagnosis not present

## 2021-11-09 DIAGNOSIS — C182 Malignant neoplasm of ascending colon: Secondary | ICD-10-CM

## 2021-11-09 LAB — GENETIC SCREENING ORDER

## 2021-11-09 NOTE — Progress Notes (Signed)
REFERRING PROVIDER: ?Ladell Pier, MD ?[address] ?[place],  [place] [address] ? ?PRIMARY PROVIDER:  ?Sharion Balloon, FNP ? ?PRIMARY REASON FOR VISIT:  ?1. Family history of breast cancer   ?2. Family history of colon cancer   ?3. COLONIC POLYPS, HX OF   ?4. Malignant neoplasm of ascending colon ([place])   ? ? ? ?HISTORY OF PRESENT ILLNESS:   ?[name] [name], a 61 y.o. male, was seen for a [facility] cancer genetics consultation at the request of Dr. Benay [name] due to a personal and family history of colon cancer.  [name] [name] presents to clinic today to discuss the possibility of a hereditary predisposition to cancer, genetic testing, and to further clarify his future cancer risks, as well as potential cancer risks for family members.  ? ?In May 2023, at the age of 70, [name] [name] was diagnosed with colon cancer. The treatment plan included surgery to remove the cancerous polyp. IHC testing was normal.  ?  ? ?CANCER HISTORY:  ?Oncology History  ? No history exists.  ? ? ?Past Medical History:  ?Diagnosis Date  ? Allergy   ? SEASONAL  ? Arthritis   ? Asthma   ? from hayfever  ? Blood in stool   ? Diabetes mellitus without complication ([place]) 44/92/0100  ? type 2  ? Family history of breast cancer   ? Family history of colon cancer   ? GERD (gastroesophageal reflux disease)   ? Hay fever   ? Hx of colonic polyps   ? Migraines   ? PONV (postoperative nausea and vomiting)   ? also history of motion sickness  ? ? ?Past Surgical History:  ?Procedure Laterality Date  ? ANTERIOR CERVICAL DECOMP/DISCECTOMY FUSION  02/20/2012  ? Procedure: ANTERIOR CERVICAL DECOMPRESSION/DISCECTOMY FUSION 1 LEVEL;  Surgeon: Erline Levine, MD;  Location: [facility];  Service: Neurosurgery;  Laterality: N/A;  Cervical six - seven  Anterior cervical decompression/diskectomy/fusion  ? APPENDECTOMY  1984  ? KNEE ARTHROSCOPY    ? right  ? NASAL SEPTUM SURGERY  2008  ? SPINAL CORD STIMULATOR INSERTION N/A 03/10/2021  ? Procedure:  SPINAL CORD STIMULATOR PLACEMENT;  Surgeon: Melina Schools, MD;  Location: [facility];  Service: Orthopedics;  Laterality: N/A;  2.5 HRS ?3C-BED  ? ? ?Social History  ? ?Socioeconomic History  ? Marital status: Married  ?  Spouse name: Not on file  ? Number of children: Not on file  ? Years of education: Not on file  ? Highest education level: Not on file  ?Occupational History  ? Not on file  ?Tobacco Use  ? Smoking status: Former  ?  Packs/day: 1.00  ?  Types: Cigarettes  ?  Passive exposure: Never  ? Smokeless tobacco: Never  ?Vaping Use  ? Vaping Use: Never used  ?Substance and Sexual Activity  ? Alcohol use: Yes  ?  Comment: occasional  ? Drug use: No  ? Sexual activity: Not on file  ?Other Topics Concern  ? Not on file  ?Social History Narrative  ? Not on file  ? ?Social Determinants of Health  ? ?Financial Resource Strain: Not on file  ?Food Insecurity: Not on file  ?Transportation Needs: Not on file  ?Physical Activity: Not on file  ?Stress: Not on file  ?Social Connections: Not on file  ?  ? ?FAMILY HISTORY:  ?We obtained a detailed, 4-generation family history.  Significant diagnoses are listed below: ?Family History  ?Problem Relation Age of Onset  ? Diabetes  Mother   ? Breast cancer Mother 79  ? Diabetes Father   ?     type II  ? Colon polyps Sister   ?     11+ polyps  ? Other Brother   ?     reportedly negative genetic testing  ? Diabetes Brother   ? Colon cancer Maternal Aunt   ? Colon cancer Maternal Uncle   ?     x2  ? Colon cancer Maternal Uncle   ?     x3  ? Colon cancer Maternal Uncle   ? Colon cancer Maternal Uncle   ? Colon cancer Maternal Uncle   ? Colon cancer Maternal Uncle   ? Other Maternal Uncle   ?     farm accident as a teen  ? Colon cancer Maternal Grandmother   ? Cancer Maternal Grandmother   ?     breast, colon   ? Diabetes Paternal Grandfather   ? Colon cancer Cousin   ?     <50  ? Colon cancer Cousin   ?     maternal cousin's children  ? Esophageal cancer Neg Hx   ? Rectal cancer Neg Hx    ? Stomach cancer Neg Hx   ? ? ? ?The patient has one son who is cancer free.  He has two brothers and a sister.  His sister reports having at least 44 colon polyps in her lifetime, and he reports that his brother has had negative genetic testing.  Both parents are deceased. ? ?The patient's father is deceased from undisclosed reasons.  He has an only child.  His parents are deceased from non-cancer related issues. ? ?The patient's mother had breast cancer at 90 and died at 71.  She had one sister and six brothers.  One brother died in an accident as a teen.  The remainder of his siblings all had colon cancer. One brother had two daughters, one daughter had colon cancer under age 32.  Both daughters have children with colon cancer, one had it in her 9's. The maternal grandmother died of colon cancer.  She had a sister who had two sons with colon cancer.  It is unknown if anyone in the family has had genetic testing. ? ?[name] [name] is unaware of previous family history of genetic testing for hereditary cancer risks. Patient's maternal ancestors are of Caucasian descent, and paternal ancestors are of [place] and [place] descent. There is no reported Ashkenazi Jewish ancestry. There is no known consanguinity. ? ?GENETIC COUNSELING ASSESSMENT: [name] [name] is a 61 y.o. male with a personal and family history of cancer which is somewhat suggestive of a hereditary cancer syndrome and predisposition to cancer given the number of individuals with colon cancer with some having younger ages of onset. We, therefore, discussed and recommended the following at today's visit.  ? ?DISCUSSION: We discussed that, in general, most cancer is not inherited in families, but instead is sporadic or familial. Sporadic cancers occur by chance and typically happen at older ages (>50 years) as this type of cancer is caused by genetic changes acquired during an individual?s lifetime. Some families have more cancers than would be expected  by chance; however, the ages or types of cancer are not consistent with a known genetic mutation or known genetic mutations have been ruled out. This type of familial cancer is thought to be due to a combination of multiple genetic, environmental, hormonal, and lifestyle factors. While this combination of factors likely increases  the risk of cancer, the exact source of this risk is not currently identifiable or testable. ? ?We discussed that 3 - 5% of colon cancer is hereditary, with most cases associated with Lynch syndrome.  There are other genes that can be associated with hereditary colon cancer syndromes.  These include APC, CHEK2, POLE, POLD1 and others.  We discussed that testing is beneficial for several reasons including knowing how to follow individuals after completing their treatment, identifying whether potential treatment options such as keytruda inhibitors would be beneficial, and understand if other family members could be at risk for cancer and allow them to undergo genetic testing.  ? ?We discussed that some people do not want to undergo genetic testing due to fear of genetic discrimination.  A federal law called the Genetic Information Non-Discrimination Act (GINA) of 2008 helps protect individuals against genetic discrimination based on their genetic test results.  It impacts both health insurance and employment.  With health insurance, it protects against increased premiums, being kicked off insurance or being forced to take a test in order to be insured.  For employment it protects against hiring, firing and promoting decisions based on genetic test results.  Health status due to a cancer diagnosis is not protected under GINA.  ? ?We reviewed the characteristics, features and inheritance patterns of hereditary cancer syndromes. We also discussed genetic testing, including the appropriate family members to test, the process of testing, insurance coverage and turn-around-time for results. We  discussed the implications of a negative, positive, carrier and/or variant of uncertain significant result. We recommended [name] [name] pursue genetic testing for the CancerNext-Expanded+RNAinsight gene p

## 2021-11-12 ENCOUNTER — Ambulatory Visit
Admission: RE | Admit: 2021-11-12 | Discharge: 2021-11-12 | Disposition: A | Discharge status: Home / Self Care | Payer: Commercial Managed Care - PPO | Source: Ambulatory Visit | Attending: Oncology | Admitting: Oncology

## 2021-11-12 ENCOUNTER — Other Ambulatory Visit: Payer: Self-pay | Admitting: Oncology

## 2021-11-12 DIAGNOSIS — C20 Malignant neoplasm of rectum: Secondary | ICD-10-CM

## 2021-11-12 MED ORDER — GADOBUTROL 1 MMOL/ML IV SOLN
10.0000 mL | Freq: Once | INTRAVENOUS | Status: AC | PRN
  Administered 2021-11-12: 10 mL via INTRAVENOUS

## 2021-11-16 ENCOUNTER — Other Ambulatory Visit: Payer: Self-pay

## 2021-11-16 NOTE — Progress Notes (Signed)
The proposed treatment discussed in conference is for discussion purpose only and is not a binding recommendation.  The patients have not been physically examined, or presented with their treatment options.  Therefore, final treatment plans cannot be decided.  

## 2021-11-18 ENCOUNTER — Inpatient Hospital Stay: Payer: Commercial Managed Care - PPO | Admitting: Oncology

## 2021-11-18 ENCOUNTER — Encounter: Payer: Self-pay | Admitting: *Deleted

## 2021-11-18 ENCOUNTER — Other Ambulatory Visit: Payer: Self-pay | Admitting: *Deleted

## 2021-11-18 VITALS — BP 133/84 | HR 82 | Temp 98.2°F | Resp 18 | Ht 72.0 in | Wt 265.0 lb

## 2021-11-18 DIAGNOSIS — G8929 Other chronic pain: Secondary | ICD-10-CM | POA: Diagnosis not present

## 2021-11-18 DIAGNOSIS — C182 Malignant neoplasm of ascending colon: Secondary | ICD-10-CM

## 2021-11-18 DIAGNOSIS — Z803 Family history of malignant neoplasm of breast: Secondary | ICD-10-CM | POA: Diagnosis not present

## 2021-11-18 DIAGNOSIS — C19 Malignant neoplasm of rectosigmoid junction: Secondary | ICD-10-CM | POA: Diagnosis present

## 2021-11-18 DIAGNOSIS — C20 Malignant neoplasm of rectum: Secondary | ICD-10-CM

## 2021-11-18 DIAGNOSIS — Z86010 Personal history of colonic polyps: Secondary | ICD-10-CM | POA: Diagnosis not present

## 2021-11-18 NOTE — Progress Notes (Unsigned)
Juliet Rude, MD  Donita Brooks D Not approved. Can't be reached safely, too posterior and high in the right lobe.

## 2021-11-18 NOTE — Progress Notes (Signed)
START OFF PATHWAY REGIMEN - Colorectal   [id number] (Leucovorin IV D1 + Fluorouracil IV D1/CIV D1,2 + Oxaliplatin IV D1) q14 Days:   A cycle is every 14 days:     Oxaliplatin      Leucovorin      Fluorouracil      Fluorouracil   **Always confirm dose/schedule in your pharmacy ordering system**  Patient Characteristics: Preoperative or Nonsurgical Candidate, M0 (Clinical Staging), Distant Metastasis Tumor Location: Rectal Therapeutic Status: Preoperative or Nonsurgical Candidate, M0 (Clinical Staging) AJCC T Category: cT4a AJCC N Category: cN2 AJCC M Category: cM1 AJCC 8 Stage Grouping: Unknown Intent of Therapy: Curative Intent, Discussed with Patient

## 2021-11-18 NOTE — Progress Notes (Unsigned)
PATIENT NAVIGATOR PROGRESS NOTE  Name: [name] [name] Date: 11/18/2021 MRN: [id number]  DOB: [birth year]   Reason for visit:  F/U appt with Dr Benay Spice  Comments:  Pretreatment discussion after surgery consult Dr Benay Spice recommends FOLFOX therapy neoadjuvantly for 8 cycles with scan after 6 Consult with radiation oncology, referral sent  Biopsy of liver lesion, request sent [facility]acement of [facility]  Chemo education with goal to start treatment on 6/7 In discussion with patient he would like to have PAC and liver bx at the same time, request made and appt pending.  6. Genetic labs pending 7. Molecular results pending    Time spent counseling/coordinating care: > 60 minutes
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
## 2021-11-22 ENCOUNTER — Encounter: Payer: Self-pay | Admitting: Licensed Clinical Social Worker

## 2021-11-22 ENCOUNTER — Encounter: Payer: Self-pay | Admitting: *Deleted

## 2021-11-22 ENCOUNTER — Other Ambulatory Visit: Payer: Self-pay | Admitting: *Deleted

## 2021-11-22 DIAGNOSIS — C20 Malignant neoplasm of rectum: Secondary | ICD-10-CM

## 2021-11-22 NOTE — Progress Notes (Signed)
[place] Work  Initial Assessment   [name] [name] is a 61 y.o. year old male contacted by phone. Clinical Social Work was referred by nurse navigator for assessment of psychosocial needs.   SDOH (Social Determinants of Health) assessments performed: Yes   SDOH Screenings   Alcohol Screen: Not on file  Depression (PHQ2-9): Low Risk    PHQ-2 Score: 0  Financial Resource Strain: Not on file  Food Insecurity: Not on file  Housing: Not on file  Physical Activity: Not on file  Social Connections: Not on file  Stress: Not on file  Tobacco Use: Medium Risk   Smoking Tobacco Use: Former   Smokeless Tobacco Use: Never   Passive Exposure: Never  Transportation Needs: Not on file     Distress Screen completed: Yes    11/04/2021    2:17 PM  ONCBCN DISTRESS SCREENING  Screening Type Initial Screening  Distress experienced in past week (1-10) 5  Emotional problem type Adjusting to illness  Information Concerns Type Lack of info about diagnosis;Lack of info about treatment  Physician notified of physical symptoms No  Referral to clinical psychology No  Referral to clinical social work No  Referral to dietition No  Referral to financial advocate No  Referral to support programs No  Referral to palliative care No      Family/Social Information:  Housing Arrangement: patient lives with spouse [name] [name], [name] [name] [phone]  Family members/support persons in your life? Family, Friends, Medical laboratory scientific officer, and Geophysical data processor concerns: no  Employment: Working full time [employer]  Income source: Employment Financial concerns: Yes, due to illness and/or loss of work during treatment Type of concern: Medical bills Food access concerns: no Religious or spiritual practice: Not known Services Currently in place:  [facility] PPO  Coping/ Adjustment to diagnosis: Patient understands treatment plan and what happens next? yes Concerns about diagnosis and/or treatment: Pain or  discomfort during procedures, Feelings of anger or sadness, Losing my job and/or losing income, Afraid of cancer, and How I will pay for the services I need Patient reported stressors: Work/ school, Finances, Anxiety/ nervousness, and Adjusting to my illness Hopes and/or priorities: N/A Patient enjoys  N/A Current coping skills/ strengths: Capable of independent living , Armed forces logistics/support/administrative officer , Scientist, research (life sciences) , Motivation for treatment/growth , Supportive family/friends , and Work skills     SUMMARY: Current SDOH Barriers:  Financial constraints related to reducing work hours or not working during treatment and loss of income and affording medical expenses.  Clinical Social Work Clinical Goal(s):  Patient will work with SW to address concerns related to financial concerns.  Interventions: Discussed common feeling and emotions when being diagnosed with cancer, and the importance of support during treatment Informed patient of the support team roles and support services at [facility] Provided CSW contact information and encouraged patient to call with any questions or concerns Provided patient with information about CSW role in patient care and available resources.   Follow Up Plan: Patient will contact CSW with any support or resource needs and Patient will contact CSW to discuss financial resources once her begins treatment. Patient verbalizes understanding of plan: Yes    [employer], LCSW

## 2021-11-22 NOTE — Progress Notes (Signed)
Referral placed for Social work due to new diagnosis of rectal cancer

## 2021-11-22 NOTE — Progress Notes (Signed)
PATIENT NAVIGATOR PROGRESS NOTE  Name: [name] [name] Date: 11/22/2021 MRN: [id number]  DOB: 11/25/1960   Reason for visit:  Port a cath placement  Comments:  Spoke with pt and PAC placement on 6/5, Monday at 11:30 arrival at admitting, nothing to eat or drink after 7 am, must have driver and 24 hour supervision. Instructions given.  No liver biopsy at this time due to location of lesion. Patient informed    Time spent counseling/coordinating care: 30-45 minutes

## 2021-11-22 NOTE — Progress Notes (Signed)
ac

## 2021-11-23 ENCOUNTER — Encounter: Payer: Self-pay | Admitting: Oncology

## 2021-11-23 NOTE — Progress Notes (Signed)
GI Location of Tumor / Histology: Rectal Cancer  [name] [name] presented to his PCP in February with complaints of rectal bleeding.  MRI Pelvis 11/12/2021: tumor at 15.5 cm from the anal verge, 7.5 cm from the internal anal sphincter, tumor extends beyond the muscularis propria with invasion of the anterior peritoneal reflection, greater than 6 lymph nodes in the mesorectum and along the superior rectal vein, largest superior rectal lymph node 10 mm, there is an extra mesorectal lymph node at the upper margin of S1, tumor in close proximity to the bladder, T4aN2  MRI abdomen 10/2021: 3 x 3.4 x 3 cm subcapsular lesion in segment 7, no other suspicious lesions, no abdominal lymphadenopathy.  CT 11/03/2021: circumferential wall thickening of the rectum, hypodense lesion in hepatic segment 7, no other evidence of metastatic disease, hepatomegaly, hepatic steatosis, prostamegaly, CAD.  Colonoscopy 10/27/2021: Tumor at the rectosigmoid, biopsy-moderately differentiated adenocarcinoma, mismatch repair protein expression intact.   Biopsies of Rectal Mass 10/27/2021    Past/Anticipated interventions by surgeon, if any:   Past/Anticipated interventions by medical oncology, if any:  Dr. Benay Spice 11/18/2021 -[name] has been diagnosed with adenocarcinoma of the rectosigmoid.   -He appears to have metastatic disease involving pelvic lymph nodes and an isolated liver lesion.  -I recommend proceeding with FOLFOX chemotherapy. -We will refer him to Dr. Dema Severin for Port-A-Cath placement. -The plan is to begin FOLFOX on 11/30/2021.  He will be referred to Dr. Lisbeth Renshaw to consider capecitabine/radiation after the course of FOLFOX.   Weight changes, if any: No  Bowel/Bladder complaints, if any: No  Nausea / Vomiting, if any: No  Pain issues, if any: No   Any blood per rectum:  Occasional   SAFETY ISSUES: Prior radiation? No Pacemaker/ICD? No Possible current pregnancy? N/a Is the patient on  methotrexate? No  Current Complaints/Details:

## 2021-11-24 ENCOUNTER — Ambulatory Visit
Admission: RE | Admit: 2021-11-24 | Discharge: 2021-11-24 | Disposition: A | Discharge status: Home / Self Care | Payer: Commercial Managed Care - PPO | Source: Ambulatory Visit | Attending: Radiation Oncology | Admitting: Radiation Oncology

## 2021-11-24 ENCOUNTER — Encounter: Payer: Self-pay | Admitting: Radiation Oncology

## 2021-11-24 ENCOUNTER — Other Ambulatory Visit: Payer: Self-pay

## 2021-11-24 VITALS — BP 128/79 | HR 87 | Temp 97.5°F | Resp 20 | Ht 72.0 in | Wt 266.8 lb

## 2021-11-24 DIAGNOSIS — R591 Generalized enlarged lymph nodes: Secondary | ICD-10-CM | POA: Insufficient documentation

## 2021-11-24 DIAGNOSIS — N400 Enlarged prostate without lower urinary tract symptoms: Secondary | ICD-10-CM | POA: Diagnosis not present

## 2021-11-24 DIAGNOSIS — E119 Type 2 diabetes mellitus without complications: Secondary | ICD-10-CM | POA: Insufficient documentation

## 2021-11-24 DIAGNOSIS — Z800 Family history of malignant neoplasm of digestive organs: Secondary | ICD-10-CM | POA: Diagnosis not present

## 2021-11-24 DIAGNOSIS — M129 Arthropathy, unspecified: Secondary | ICD-10-CM | POA: Diagnosis not present

## 2021-11-24 DIAGNOSIS — I2510 Atherosclerotic heart disease of native coronary artery without angina pectoris: Secondary | ICD-10-CM | POA: Diagnosis not present

## 2021-11-24 DIAGNOSIS — K760 Fatty (change of) liver, not elsewhere classified: Secondary | ICD-10-CM | POA: Insufficient documentation

## 2021-11-24 DIAGNOSIS — K4020 Bilateral inguinal hernia, without obstruction or gangrene, not specified as recurrent: Secondary | ICD-10-CM | POA: Diagnosis not present

## 2021-11-24 DIAGNOSIS — C20 Malignant neoplasm of rectum: Secondary | ICD-10-CM | POA: Diagnosis not present

## 2021-11-24 DIAGNOSIS — K769 Liver disease, unspecified: Secondary | ICD-10-CM | POA: Insufficient documentation

## 2021-11-24 DIAGNOSIS — Z803 Family history of malignant neoplasm of breast: Secondary | ICD-10-CM | POA: Insufficient documentation

## 2021-11-24 DIAGNOSIS — Z7951 Long term (current) use of inhaled steroids: Secondary | ICD-10-CM | POA: Diagnosis not present

## 2021-11-24 DIAGNOSIS — I700 Atherosclerosis of aorta: Secondary | ICD-10-CM | POA: Diagnosis not present

## 2021-11-24 DIAGNOSIS — J45909 Unspecified asthma, uncomplicated: Secondary | ICD-10-CM | POA: Diagnosis not present

## 2021-11-24 DIAGNOSIS — K219 Gastro-esophageal reflux disease without esophagitis: Secondary | ICD-10-CM | POA: Insufficient documentation

## 2021-11-24 DIAGNOSIS — Z7985 Long-term (current) use of injectable non-insulin antidiabetic drugs: Secondary | ICD-10-CM | POA: Insufficient documentation

## 2021-11-24 DIAGNOSIS — R160 Hepatomegaly, not elsewhere classified: Secondary | ICD-10-CM | POA: Insufficient documentation

## 2021-11-24 DIAGNOSIS — Z7984 Long term (current) use of oral hypoglycemic drugs: Secondary | ICD-10-CM | POA: Diagnosis not present

## 2021-11-24 DIAGNOSIS — Z79899 Other long term (current) drug therapy: Secondary | ICD-10-CM | POA: Diagnosis not present

## 2021-11-24 DIAGNOSIS — Z87891 Personal history of nicotine dependence: Secondary | ICD-10-CM | POA: Diagnosis not present

## 2021-11-24 DIAGNOSIS — K5730 Diverticulosis of large intestine without perforation or abscess without bleeding: Secondary | ICD-10-CM | POA: Insufficient documentation

## 2021-11-25 ENCOUNTER — Inpatient Hospital Stay: Payer: Commercial Managed Care - PPO | Attending: Oncology

## 2021-11-25 ENCOUNTER — Inpatient Hospital Stay: Payer: Commercial Managed Care - PPO

## 2021-11-25 ENCOUNTER — Other Ambulatory Visit: Payer: Self-pay | Admitting: Radiology

## 2021-11-25 DIAGNOSIS — Z5111 Encounter for antineoplastic chemotherapy: Secondary | ICD-10-CM | POA: Insufficient documentation

## 2021-11-25 DIAGNOSIS — C19 Malignant neoplasm of rectosigmoid junction: Secondary | ICD-10-CM | POA: Diagnosis present

## 2021-11-25 DIAGNOSIS — C20 Malignant neoplasm of rectum: Secondary | ICD-10-CM

## 2021-11-25 LAB — CMP (CANCER CENTER ONLY)
ALT: 27 U/L (ref 0–44)
AST: 15 U/L (ref 15–41)
Albumin: 4.4 g/dL (ref 3.5–5.0)
Alkaline Phosphatase: 57 U/L (ref 38–126)
Anion gap: 10 (ref 5–15)
BUN: 24 mg/dL — ABNORMAL HIGH (ref 8–23)
CO2: 23 mmol/L (ref 22–32)
Calcium: 9.6 mg/dL (ref 8.9–10.3)
Chloride: 106 mmol/L (ref 98–111)
Creatinine: 0.97 mg/dL (ref 0.61–1.24)
GFR, Estimated: >60 mL/min (ref >60)
Glucose, Bld: 173 mg/dL — ABNORMAL HIGH (ref 70–99)
Potassium: 4.4 mmol/L (ref 3.5–5.1)
Sodium: 139 mmol/L (ref 135–145)
Total Bilirubin: 0.5 mg/dL (ref 0.3–1.2)
Total Protein: 7.4 g/dL (ref 6.5–8.1)

## 2021-11-25 LAB — CBC WITH DIFFERENTIAL (CANCER CENTER ONLY)
Abs Immature Granulocytes: 0.02 10*3/uL (ref 0.00–0.07)
Basophils Absolute: 0.1 10*3/uL (ref 0.0–0.1)
Basophils Relative: 2 %
Eosinophils Absolute: 0.3 10*3/uL (ref 0.0–0.5)
Eosinophils Relative: 5 %
HCT: 49.4 % (ref 39.0–52.0)
Hemoglobin: 16.4 g/dL (ref 13.0–17.0)
Immature Granulocytes: 0 %
Lymphocytes Relative: 21 %
Lymphs Abs: 1.4 10*3/uL (ref 0.7–4.0)
MCH: 29 pg (ref 26.0–34.0)
MCHC: 33.2 g/dL (ref 30.0–36.0)
MCV: 87.4 fL (ref 80.0–100.0)
Monocytes Absolute: 0.5 10*3/uL (ref 0.1–1.0)
Monocytes Relative: 8 %
Neutro Abs: 4.2 10*3/uL (ref 1.7–7.7)
Neutrophils Relative %: 64 %
Platelet Count: 174 10*3/uL (ref 150–400)
RBC: 5.65 MIL/uL (ref 4.22–5.81)
RDW: 13.6 % (ref 11.5–15.5)
WBC Count: 6.5 10*3/uL (ref 4.0–10.5)
nRBC: 0 % (ref 0.0–0.2)

## 2021-11-25 LAB — CEA (ACCESS): CEA (CHCC): 6.18 ng/mL — ABNORMAL HIGH (ref 0.00–5.00)

## 2021-11-25 MED ORDER — ONDANSETRON HCL 8 MG PO TABS: 8.0000 mg | ORAL_TABLET | Freq: Three times a day (TID) | ORAL | 0 refills | Status: DC | PRN

## 2021-11-25 MED ORDER — PROCHLORPERAZINE MALEATE 10 MG PO TABS: 10.0000 mg | ORAL_TABLET | Freq: Four times a day (QID) | ORAL | 0 refills | Status: DC | PRN

## 2021-11-25 MED ORDER — LIDOCAINE-PRILOCAINE 2.5-2.5 % EX CREA: 1.0000 "application " | TOPICAL_CREAM | CUTANEOUS | 0 refills | Status: DC | PRN

## 2021-11-27 ENCOUNTER — Other Ambulatory Visit: Payer: Self-pay | Admitting: Oncology

## 2021-11-28 ENCOUNTER — Ambulatory Visit (HOSPITAL_COMMUNITY)
Admission: RE | Admit: 2021-11-28 | Discharge: 2021-11-28 | Disposition: A | Discharge status: Home / Self Care | Payer: Commercial Managed Care - PPO | Source: Ambulatory Visit | Attending: Oncology | Admitting: Oncology

## 2021-11-28 ENCOUNTER — Encounter (HOSPITAL_COMMUNITY): Payer: Self-pay

## 2021-11-28 DIAGNOSIS — Z7985 Long-term (current) use of injectable non-insulin antidiabetic drugs: Secondary | ICD-10-CM | POA: Diagnosis not present

## 2021-11-28 DIAGNOSIS — E119 Type 2 diabetes mellitus without complications: Secondary | ICD-10-CM | POA: Insufficient documentation

## 2021-11-28 DIAGNOSIS — C20 Malignant neoplasm of rectum: Secondary | ICD-10-CM | POA: Insufficient documentation

## 2021-11-28 DIAGNOSIS — K219 Gastro-esophageal reflux disease without esophagitis: Secondary | ICD-10-CM | POA: Diagnosis not present

## 2021-11-28 DIAGNOSIS — Z7984 Long term (current) use of oral hypoglycemic drugs: Secondary | ICD-10-CM | POA: Diagnosis not present

## 2021-11-28 HISTORY — PX: IR IMAGING GUIDED PORT INSERTION: IMG5740

## 2021-11-28 LAB — GLUCOSE, CAPILLARY: Glucose-Capillary: 133 mg/dL — ABNORMAL HIGH (ref 70–99)

## 2021-11-28 MED ORDER — FENTANYL CITRATE (PF) 100 MCG/2ML IJ SOLN
INTRAMUSCULAR | Status: AC
  Filled 2021-11-28: qty 2

## 2021-11-28 MED ORDER — HEPARIN SOD (PORK) LOCK FLUSH 100 UNIT/ML IV SOLN
INTRAVENOUS | Status: AC
  Filled 2021-11-28: qty 5

## 2021-11-28 MED ORDER — HEPARIN SOD (PORK) LOCK FLUSH 100 UNIT/ML IV SOLN
INTRAVENOUS | Status: AC | PRN
  Administered 2021-11-28: 500 [IU]

## 2021-11-28 MED ORDER — FENTANYL CITRATE (PF) 100 MCG/2ML IJ SOLN
INTRAMUSCULAR | Status: AC | PRN
  Administered 2021-11-28 (×2): 50 ug via INTRAVENOUS

## 2021-11-28 MED ORDER — SODIUM CHLORIDE 0.9 % IV SOLN: INTRAVENOUS | Status: DC

## 2021-11-28 MED ORDER — LIDOCAINE-EPINEPHRINE 1 %-1:100000 IJ SOLN
INTRAMUSCULAR | Status: AC
  Filled 2021-11-28: qty 1

## 2021-11-28 MED ORDER — MIDAZOLAM HCL 2 MG/2ML IJ SOLN
INTRAMUSCULAR | Status: AC
  Filled 2021-11-28: qty 4

## 2021-11-28 MED ORDER — LIDOCAINE-EPINEPHRINE 1 %-1:100000 IJ SOLN
INTRAMUSCULAR | Status: AC | PRN
  Administered 2021-11-28: 15 mL via INTRADERMAL

## 2021-11-28 MED ORDER — MIDAZOLAM HCL 2 MG/2ML IJ SOLN
INTRAMUSCULAR | Status: AC | PRN
  Administered 2021-11-28 (×2): 1 mg via INTRAVENOUS
  Administered 2021-11-28: 2 mg via INTRAVENOUS

## 2021-11-28 NOTE — Progress Notes (Signed)
Radiation Oncology         [phone] ________________________________  Name: [name] [name]: [id number]  Date: 11/24/2021  DOB: 11/18/1960  [id number], Theador Hawthorne, FNP  Ladell Pier, MD     REFERRING PHYSICIAN: Ladell Pier, MD   DIAGNOSIS: The encounter diagnosis was Rectal cancer [facility]).   HISTORY OF PRESENT ILLNESS::[name] [name] is a 61 y.o. male who is seen for an initial consultation visit regarding the patient's diagnosis of adenocarcinoma of the rectum.  The patient noticed some discomfort in the pelvic region associated with some bleeding.  Work-up revealed a tumor in the region of the rectosigmoid junction.  CT imaging did show some thickening in this region as well as a potential hypodense lesion in segment 7 of the liver.  MRI scan of the pelvis revealed a T4AN2 tumor measuring 3.9 cm.  It appeared to invade into the upper rectum from originating at the site near the rectosigmoid junction.  The patient did proceed with an MRI scan of the abdomen which remains suspicious for a solitary metastatic lesion.  Biopsy did confirm adenocarcinoma within the rectum.  The patient's case has been discussed in multidisciplinary GI conference and plans have been discussed with medical oncology to proceed with total neoadjuvant treatment which would consist of initial chemotherapy followed by chemoradiation to the pelvis.  This would be followed by surgical resection and also either surgical resection of the liver lesion versus ablation.  The patient is trying to decide how he would like to proceed and I have been asked to see the patient today for consideration of radiation treatment at the appropriate time.    PREVIOUS RADIATION THERAPY: No   PAST MEDICAL HISTORY:  has a past medical history of Allergy, Arthritis, Asthma, Blood in stool, Diabetes mellitus without complication ([place]) (18/29/9371), Family history of breast cancer, Family history of colon cancer, GERD  (gastroesophageal reflux disease), Hay fever, colonic polyps, Migraines, and PONV (postoperative nausea and vomiting).     PAST SURGICAL HISTORY: Past Surgical History:  Procedure Laterality Date   ANTERIOR CERVICAL DECOMP/DISCECTOMY FUSION  02/20/2012   Procedure: ANTERIOR CERVICAL DECOMPRESSION/DISCECTOMY FUSION 1 LEVEL;  Surgeon: Erline Levine, MD;  Location: [facility];  Service: Neurosurgery;  Laterality: N/A;  Cervical six - seven  Anterior cervical decompression/diskectomy/fusion   APPENDECTOMY  1984   KNEE ARTHROSCOPY     right   NASAL SEPTUM SURGERY  2008   SPINAL CORD STIMULATOR INSERTION N/A 03/10/2021   Procedure: SPINAL CORD STIMULATOR PLACEMENT;  Surgeon: Melina Schools, MD;  Location: [facility];  Service: Orthopedics;  Laterality: N/A;  2.5 HRS 3C-BED     FAMILY HISTORY: family history includes Breast cancer (age of onset: 32) in his mother; Cancer in his maternal grandmother; Colon cancer in his cousin, cousin, maternal aunt, maternal grandmother, maternal uncle, maternal uncle, maternal uncle, maternal uncle, maternal uncle, and maternal uncle; Colon polyps in his sister; Diabetes in his brother, father, mother, and paternal grandfather; Other in his brother and maternal uncle.   SOCIAL HISTORY:  reports that he has quit smoking. His smoking use included cigarettes. He smoked an average of 1 pack per day. He has never been exposed to tobacco smoke. He has never used smokeless tobacco. He reports current alcohol use. He reports that he does not use drugs.   ALLERGIES: Other   MEDICATIONS:  Current Outpatient Medications  Medication Sig Dispense Refill   Continuous Blood Gluc Sensor (FREESTYLE LIBRE 3 SENSOR) MISC 1 each by Does  not apply route every 14 (fourteen) days. Place 1 sensor on the skin every 14 days. Use to check glucose continuously 2 each 11   Dulaglutide (TRULICITY) 1.5 ER/1.5QM SOPN Inject 1.5 mg into the skin once a week. 6 mL 1   empagliflozin (JARDIANCE) 25  MG TABS tablet Take 1 tablet (25 mg total) by mouth daily before breakfast. 90 tablet 1   metFORMIN (GLUCOPHAGE) 500 MG tablet Take 1 tablet (500 mg total) by mouth 2 (two) times daily with a meal. 180 tablet 1   montelukast (SINGULAIR) 10 MG tablet Take 1 tablet (10 mg total) by mouth at bedtime. 90 tablet 1   Naproxen Sodium (ALEVE PO) Take by mouth 2 (two) times daily. TAKE TWO TWICE A DAY     omeprazole (PRILOSEC) 40 MG capsule Take 1 capsule (40 mg total) by mouth daily as needed. For acid reflux (Patient taking differently: Take 40 mg by mouth daily as needed (as needed). For acid reflux) 90 capsule 1   rosuvastatin (CRESTOR) 10 MG tablet Take 1 tablet (10 mg total) by mouth daily. 90 tablet 1   tamsulosin (FLOMAX) 0.4 MG CAPS capsule Take 2 capsules (0.8 mg total) by mouth daily. 180 capsule 1   albuterol (VENTOLIN HFA) 108 (90 Base) MCG/ACT inhaler Inhale 2 puffs into the lungs every 6 (six) hours as needed for wheezing or shortness of breath. (Patient not taking: Reported on 09/08/2021) 18 g 2   beclomethasone (QVAR REDIHALER) 80 MCG/ACT inhaler Inhale 2 puffs into the lungs 2 (two) times daily. (Patient not taking: Reported on 09/08/2021) 1 each 2   fluticasone (FLONASE) 50 MCG/ACT nasal spray Place 2 sprays into both nostrils daily. (Patient not taking: Reported on 11/18/2021) 48 g 1   levocetirizine (XYZAL) 5 MG tablet Take 1 tablet (5 mg total) by mouth every evening. (Patient not taking: Reported on 11/18/2021) 90 tablet 1   lidocaine-prilocaine (EMLA) cream Apply 1 application. topically as needed. 30 g 0   ondansetron (ZOFRAN) 8 MG tablet Take 1 tablet (8 mg total) by mouth every 8 (eight) hours as needed for nausea or vomiting. 20 tablet 0   prochlorperazine (COMPAZINE) 10 MG tablet Take 1 tablet (10 mg total) by mouth every 6 (six) hours as needed for nausea or vomiting. 30 tablet 0   No current facility-administered medications for this encounter.     REVIEW OF SYSTEMS:  A 15 point  review of systems is documented in the electronic medical record. This was obtained by the nursing staff. However, I reviewed this with the patient to discuss relevant findings and make appropriate changes.  Pertinent items are noted in HPI.    PHYSICAL EXAM:  height is 6' (1.829 m) and weight is 266 lb 12.8 oz (121 kg). His temperature is 97.5 F (36.4 C) (abnormal). His blood pressure is 128/79 and his pulse is 87. His respiration is 20 and oxygen saturation is 97%.   ECOG = 1  0 - Asymptomatic (Fully active, able to carry on all predisease activities without restriction)  1 - Symptomatic but completely ambulatory (Restricted in physically strenuous activity but ambulatory and able to carry out work of a light or sedentary nature. For example, light housework, office work)  2 - Symptomatic, <50% in bed during the day (Ambulatory and capable of all self care but unable to carry out any work activities. Up and about more than 50% of waking hours)  3 - Symptomatic, >50% in bed, but not bedbound (Capable of only  limited self-care, confined to bed or chair 50% or more of waking hours)  4 - Bedbound (Completely disabled. Cannot carry on any self-care. Totally confined to bed or chair)  5 - Death   Eustace Pen MM, Creech RH, Tormey DC, et al. [phone]). "Toxicity and response criteria of the [facility]". [place] Oncol. 5 (6): 649-55  Alert, no acute distress   LABORATORY DATA:  Lab Results  Component Value Date   WBC 6.5 11/25/2021   HGB 16.4 11/25/2021   HCT 49.4 11/25/2021   MCV 87.4 11/25/2021   PLT 174 11/25/2021   Lab Results  Component Value Date   NA 139 11/25/2021   K 4.4 11/25/2021   CL 106 11/25/2021   CO2 23 11/25/2021   Lab Results  Component Value Date   ALT 27 11/25/2021   AST 15 11/25/2021   ALKPHOS 57 11/25/2021   BILITOT 0.5 11/25/2021      RADIOGRAPHY: MR PELVIS WO CONTRAST  Result Date: 11/14/2021 CLINICAL DATA:  Rectal cancer  staging in a 61 year old male EXAM: MRI PELVIS WITHOUT CONTRAST TECHNIQUE: Multiplanar multisequence MR imaging of the pelvis was performed. No intravenous contrast was administered. Ultrasound gel was administered per rectum to optimize tumor evaluation. COMPARISON:  CT of the chest, abdomen and pelvis from Nov 03, 2021. FINDINGS: TUMOR LOCATION Tumor distance from Anal Verge/Skin Surface:  15.5 cm Tumor distance to Internal Anal Sphincter: 7.5 cm TUMOR DESCRIPTION Circumferential Extent: Circumferential tumor with eccentric thickening, thickening slightly greater along the RIGHT lateral rectum, at the rectosigmoid junction. Tumor Length: 3.9 cm T - CATEGORY Extension through Muscularis Propria: Yes with approximately 3 mm extension beyond the muscularis. Shortest Distance of any tumor/node from Mesorectal Fascia: 0 mm Extramural Vascular Invasion/Tumor Thrombus: No Invasion of Anterior Peritoneal Reflection: Yes Involvement of Adjacent Organs or Pelvic Sidewall: No Levator Ani Involvement: No N - CATEGORY Mesorectal Lymph Nodes >=48m: Greater than 6 lymph nodes in the mesorectum and along superior rectal vein greater than 5 mm largest superior rectal lymph node (image 4/7) 10 mm short axis. Extra-mesorectal Lymphadenopathy: Yes, with superior rectal node at the upper margin of the S1 level (image 19/2) Other: Urinary Tract: Urinary bladder with smooth contours, no visible lesion. No distal ureteral dilation. Note that tumor comes in close proximity to but does not appear to directly involve the urinary bladder (image 13/7 tumor extending from the anterior rectum towards the urinary bladder does not appear to contact the urinary bladder on sagittal images this is the area of suspected anterior peritoneal reflection involvement best seen on image 21/2 Bowel: As above Vascular/Lymphatic: As above Reproductive: Unremarkable. Other: No ascites, colonic diverticulosis and evidence of bilateral fat containing inguinal  hernias. Musculoskeletal: On limited imaging of the pelvis no acute bony abnormality. IMPRESSION: 1. Circumferential tumor at the rectosigmoid junction with eccentric thickening and extension beyond the muscularis, approximately 3 mm felt to involve the anterior peritoneal reflection and associated with multiple pelvic lymph nodes, T4aN2, with suspected metastatic disease to the liver based on liver MRI. Electronically Signed   By: GZetta BillsM.D.   On: 11/14/2021 08:13   MR Abdomen W Wo Contrast  Result Date: 11/14/2021 CLINICAL DATA:  Rectal cancer.  Liver lesion on recent CT. EXAM: MRI ABDOMEN WITHOUT AND WITH CONTRAST TECHNIQUE: Multiplanar multisequence MR imaging of the abdomen was performed both before and after the administration of intravenous contrast. CONTRAST:  113mGADAVIST GADOBUTROL 1 MMOL/ML IV SOLN COMPARISON:  CT scan 11/03/2021 FINDINGS: Lower  chest: Unremarkable. Hepatobiliary: 3.0 x 3.4 x 3.0 cm subcapsular lesion posterior right liver (segment VII) restricts diffusion and shows heterogeneous peripheral rim enhancement after IV contrast administration. Imaging features highly concerning for metastatic lesion given the patient's known primary rectal malignancy. No other suspicious liver lesion evident. There is no evidence for gallstones, gallbladder wall thickening, or pericholecystic fluid. No intrahepatic or extrahepatic biliary dilation. Pancreas: No focal mass lesion. No dilatation of the main duct. No intraparenchymal cyst. No peripancreatic edema. Spleen:  No splenomegaly. No focal mass lesion. Adrenals/Urinary Tract: No adrenal nodule or mass. Kidneys unremarkable. Stomach/Bowel: Stomach is unremarkable. No gastric wall thickening. No evidence of outlet obstruction. Duodenum is normally positioned as is the ligament of Treitz. No small bowel or colonic dilatation within the visualized abdomen. Vascular/Lymphatic: No abdominal aortic aneurysm. No abdominal lymphadenopathy Other:   No intraperitoneal free fluid. Musculoskeletal: No focal suspicious marrow enhancement within the visualized bony anatomy. IMPRESSION: 3.0 x 3.4 x 3.0 cm subcapsular lesion posterior right liver (segment VII) highly concerning for metastatic lesion given the patient's known primary rectal malignancy. No other evidence for metastatic disease in the abdomen. Electronically Signed   By: Misty Stanley M.D.   On: 11/14/2021 07:31   CT CHEST ABDOMEN PELVIS W CONTRAST  Result Date: 11/03/2021 CLINICAL DATA:  New diagnosis colon cancer staging * Tracking Code: BO * EXAM: CT CHEST, ABDOMEN, AND PELVIS WITH CONTRAST TECHNIQUE: Multidetector CT imaging of the chest, abdomen and pelvis was performed following the standard protocol during bolus administration of intravenous contrast. RADIATION DOSE REDUCTION: This exam was performed according to the departmental dose-optimization program which includes automated exposure control, adjustment of the mA and/or kV according to patient size and/or use of iterative reconstruction technique. CONTRAST:  180m OMNIPAQUE IOHEXOL 300 MG/ML SOLN, additional oral enteric contrast COMPARISON:  None Available. FINDINGS: CT CHEST FINDINGS Cardiovascular: Aortic atherosclerosis. Normal heart size. Left coronary artery calcifications. No pericardial effusion. Mediastinum/Nodes: No enlarged mediastinal, hilar, or axillary lymph nodes. Thyroid gland, trachea, and esophagus demonstrate no significant findings. Lungs/Pleura: Lungs are clear. No pleural effusion or pneumothorax. Musculoskeletal: No chest wall mass or suspicious osseous lesions identified. CT ABDOMEN PELVIS FINDINGS Hepatobiliary: Hypodense lesion of the posterior right lobe of the liver, hepatic segment VII, measuring 2.4 x 2.9 cm (series 2, image 53). Hepatomegaly, maximum coronal span 21.0 cm. Hepatic steatosis. Tiny gallstones calcific sludge in the dependent gallbladder. No gallbladder wall thickening, or biliary dilatation.  Pancreas: Unremarkable. No pancreatic ductal dilatation or surrounding inflammatory changes. Spleen: Normal in size without significant abnormality. Adrenals/Urinary Tract: Adrenal glands are unremarkable. Kidneys are normal, without renal calculi, solid lesion, or hydronephrosis. Bladder is unremarkable. Stomach/Bowel: Stomach is within normal limits. Appendix not clearly visualized. Circumferential wall thickening of the rectum (series 2, image 119). Descending and sigmoid diverticulosis. Vascular/Lymphatic: Aortic atherosclerosis. No enlarged abdominal or pelvic lymph nodes. Reproductive: Prostatomegaly. Other: Small, fat containing bilateral inguinal hernias. No ascites. Musculoskeletal: No acute osseous findings. IMPRESSION: 1. Circumferential wall thickening of the rectum, in keeping with primary rectal malignancy. Correlate colonoscopy findings. 2. Lesion of the posterior right lobe of the liver, suspicious for hepatic metastasis although incompletely characterized. Recommend multiphasic contrast enhanced MRI to further evaluate. 3. No other evidence of lymphadenopathy or metastatic disease in the chest, abdomen, or pelvis. 4. Hepatomegaly and hepatic steatosis. 5. Prostatomegaly. 6. Coronary artery disease. Aortic Atherosclerosis (ICD10-I70.0). Electronically Signed   By: ADelanna AhmadiM.D.   On: 11/03/2021 15:08       IMPRESSION/ PLAN:  The patient  was seen today for his recent diagnosis of adenocarcinoma of rectum.  The tumor appears to originate near the rectosigmoid junction and extends into the upper rectal region.  This was discussed in multidisciplinary GI conference and it was felt that this would be reasonable to proceed with treatment for an upper rectal tumor.  This appears locally advanced and a solitary metastatic lesion within the liver also appears present based on recent imaging.  He has been diagnosed clinically as having a T4 aN2 M1 adenocarcinoma of the upper rectum therefore.  Given  the patient's age and goals of treatment, it has been recommended to proceed with neoadjuvant total treatment ultimately followed by radiation treatment and surgical resection of the pelvis as well as addressing the liver lesion.  All of this was discussed with the patient today.  He had a number of good questions and is trying to decide how to proceed.  He will further discuss his upcoming tentative plan to begin chemotherapy with medical oncology in the near future.  I did discuss the role of radiation treatment in this setting which would consist of 5-1/2 weeks at the appropriate time.  I look forward to seeing the patient hopefully in a few months and certainly the patient was encouraged to contact our office before then if we can be of any further assistance or answer any questions.  The patient was seen in person today in clinic.  The total time spent on the patient's visit today was 50 minutes, including chart review, direct discussion/evaluation with the patient, and coordination of care.        ________________________________   Jodelle Gross, MD, PhD   **Disclaimer: This note was dictated with voice recognition software. Similar sounding words can inadvertently be transcribed and this note may contain transcription errors which may not have been corrected upon publication of note.**

## 2021-11-28 NOTE — Consult Note (Signed)
Chief Complaint: Patient was seen in consultation today for Port-A-Cath placement  Referring Physician(s): [name] B  Supervising Physician: Michaelle Birks  Patient Status: [facility] - Out-pt  History of Present Illness: [name] [name] is a 61 y.o. male with past medical history significant for arthritis, diabetes, GERD, and newly diagnosed rectal cancer who presents today for Port-A-Cath placement for chemotherapy.  Past Medical History:  Diagnosis Date   Allergy    SEASONAL   Arthritis    Asthma    from hayfever   Blood in stool    Diabetes mellitus without complication ([place]) 98/33/8250   type 2   Family history of breast cancer    Family history of colon cancer    GERD (gastroesophageal reflux disease)    Hay fever    Hx of colonic polyps    Migraines    PONV (postoperative nausea and vomiting)    also history of motion sickness    Past Surgical History:  Procedure Laterality Date   ANTERIOR CERVICAL DECOMP/DISCECTOMY FUSION  02/20/2012   Procedure: ANTERIOR CERVICAL DECOMPRESSION/DISCECTOMY FUSION 1 LEVEL;  Surgeon: Erline Levine, MD;  Location: [place] [facility];  Service: Neurosurgery;  Laterality: N/A;  Cervical six - seven  Anterior cervical decompression/diskectomy/fusion   APPENDECTOMY  1984   KNEE ARTHROSCOPY     right   NASAL SEPTUM SURGERY  2008   SPINAL CORD STIMULATOR INSERTION N/A 03/10/2021   Procedure: SPINAL CORD STIMULATOR PLACEMENT;  Surgeon: Melina Schools, MD;  Location: [place];  Service: Orthopedics;  Laterality: N/A;  2.5 HRS 3C-BED    Allergies: Other  Medications: Prior to Admission medications   Medication Sig Start Date End Date Taking? Authorizing Provider  Dulaglutide (TRULICITY) 1.5 NL/9.7QB SOPN Inject 1.5 mg into the skin once a week. 09/28/21  Yes Hawks, Christy A, FNP  empagliflozin (JARDIANCE) 25 MG TABS tablet Take 1 tablet (25 mg total) by mouth daily before breakfast. 09/28/21  Yes Hawks, Christy A, FNP  metFORMIN  (GLUCOPHAGE) 500 MG tablet Take 1 tablet (500 mg total) by mouth 2 (two) times daily with a meal. 09/23/21  Yes Hawks, Christy A, FNP  montelukast (SINGULAIR) 10 MG tablet Take 1 tablet (10 mg total) by mouth at bedtime. 09/23/21  Yes Hawks, Christy A, FNP  Naproxen Sodium (ALEVE PO) Take by mouth 2 (two) times daily. TAKE TWO TWICE A DAY   Yes [provider]  omeprazole (PRILOSEC) 40 MG capsule Take 1 capsule (40 mg total) by mouth daily as needed. For acid reflux Patient taking differently: Take 40 mg by mouth daily as needed (as needed). For acid reflux 03/21/21  Yes Hawks, Christy A, FNP  rosuvastatin (CRESTOR) 10 MG tablet Take 1 tablet (10 mg total) by mouth daily. 03/21/21  Yes Hawks, Christy A, FNP  tamsulosin (FLOMAX) 0.4 MG CAPS capsule Take 2 capsules (0.8 mg total) by mouth daily. 09/23/21  Yes Hawks, Christy A, FNP  albuterol (VENTOLIN HFA) 108 (90 Base) MCG/ACT inhaler Inhale 2 puffs into the lungs every 6 (six) hours as needed for wheezing or shortness of breath. Patient not taking: Reported on 09/08/2021 08/09/20   Sharion Balloon, FNP  beclomethasone (QVAR REDIHALER) 80 MCG/ACT inhaler Inhale 2 puffs into the lungs 2 (two) times daily. Patient not taking: Reported on 09/08/2021 08/10/20   Sharion Balloon, FNP  Continuous Blood Gluc Sensor (FREESTYLE LIBRE 3 SENSOR) MISC 1 each by Does not apply route every 14 (fourteen) days. Place 1 sensor on the skin every 14 days.  Use to check glucose continuously 05/11/21   Evelina Dun A, FNP  fluticasone (FLONASE) 50 MCG/ACT nasal spray Place 2 sprays into both nostrils daily. Patient not taking: Reported on 11/18/2021 03/21/21   Sharion Balloon, FNP  levocetirizine (XYZAL) 5 MG tablet Take 1 tablet (5 mg total) by mouth every evening. Patient not taking: Reported on 11/18/2021 08/09/20   Sharion Balloon, FNP  lidocaine-prilocaine (EMLA) cream Apply 1 application. topically as needed. 11/25/21   Ladell Pier, MD  ondansetron (ZOFRAN) 8 MG  tablet Take 1 tablet (8 mg total) by mouth every 8 (eight) hours as needed for nausea or vomiting. 11/25/21   Ladell Pier, MD  prochlorperazine (COMPAZINE) 10 MG tablet Take 1 tablet (10 mg total) by mouth every 6 (six) hours as needed for nausea or vomiting. 11/25/21   Ladell Pier, MD     Family History  Problem Relation Age of Onset   Diabetes Mother    Breast cancer Mother 89   Diabetes Father        type II   Colon polyps Sister        11+ polyps   Other Brother        reportedly negative genetic testing   Diabetes Brother    Colon cancer Maternal Aunt    Colon cancer Maternal Uncle        x2   Colon cancer Maternal Uncle        x3   Colon cancer Maternal Uncle    Colon cancer Maternal Uncle    Colon cancer Maternal Uncle    Colon cancer Maternal Uncle    Other Maternal Uncle        farm accident as a teen   Colon cancer Maternal Grandmother    Cancer Maternal Grandmother        breast, colon    Diabetes Paternal Grandfather    Colon cancer Cousin        <50   Colon cancer Cousin        maternal cousin's children   Esophageal cancer Neg Hx    Rectal cancer Neg Hx    Stomach cancer Neg Hx     Social History   Socioeconomic History   Marital status: Married    Spouse name: Not on file   Number of children: Not on file   Years of education: Not on file   Highest education level: Not on file  Occupational History   Not on file  Tobacco Use   Smoking status: Former    Packs/day: 1.00    Types: Cigarettes    Passive exposure: Never   Smokeless tobacco: Never  Vaping Use   Vaping Use: Never used  Substance and Sexual Activity   Alcohol use: Yes    Comment: occasional   Drug use: No   Sexual activity: Not on file  Other Topics Concern   Not on file  Social History Narrative   Not on file   Social Determinants of Health   Financial Resource Strain: Low Risk    Difficulty of Paying Living Expenses: Not very hard  Food Insecurity: No Food  Insecurity   Worried About Charity fundraiser in the Last Year: Never true   Ran Out of Food in the Last Year: Never true  Transportation Needs: No Transportation Needs   Lack of Transportation (Medical): No   Lack of Transportation (Non-Medical): No  Physical Activity: Not on file  Stress: Stress Concern Present  Feeling of Stress : To some extent  Social Connections: Unknown   Frequency of Communication with Friends and Family: Three times a week   Frequency of Social Gatherings with Friends and Family: Twice a week   Attends Religious Services: Patient refused   Marine scientist or Organizations: Patient refused   Attends Archivist Meetings: Patient refused   Marital Status: Married      Review of Systems denies fever, headache, chest pain, dyspnea, cough, abdominal/back pain, nausea, vomiting or bleeding.  Vital Signs: BP 131/87   Pulse 80   Temp (!) 97.4 F (36.3 C) (Oral)   Resp 16   SpO2 95%   Physical Exam awake, alert.  Chest clear to auscultation bilaterally.  Heart with regular rate and rhythm.  Abdomen soft, positive bowel sounds, nontender.  No lower extremity edema  Imaging: MR PELVIS WO CONTRAST  Result Date: 11/14/2021 CLINICAL DATA:  Rectal cancer staging in a 61 year old male EXAM: MRI PELVIS WITHOUT CONTRAST TECHNIQUE: Multiplanar multisequence MR imaging of the pelvis was performed. No intravenous contrast was administered. Ultrasound gel was administered per rectum to optimize tumor evaluation. COMPARISON:  CT of the chest, abdomen and pelvis from Nov 03, 2021. FINDINGS: TUMOR LOCATION Tumor distance from Anal Verge/Skin Surface:  15.5 cm Tumor distance to Internal Anal Sphincter: 7.5 cm TUMOR DESCRIPTION Circumferential Extent: Circumferential tumor with eccentric thickening, thickening slightly greater along the RIGHT lateral rectum, at the rectosigmoid junction. Tumor Length: 3.9 cm T - CATEGORY Extension through Muscularis Propria: Yes  with approximately 3 mm extension beyond the muscularis. Shortest Distance of any tumor/node from Mesorectal Fascia: 0 mm Extramural Vascular Invasion/Tumor Thrombus: No Invasion of Anterior Peritoneal Reflection: Yes Involvement of Adjacent Organs or Pelvic Sidewall: No Levator Ani Involvement: No N - CATEGORY Mesorectal Lymph Nodes >=5m: Greater than 6 lymph nodes in the mesorectum and along superior rectal vein greater than 5 mm largest superior rectal lymph node (image 4/7) 10 mm short axis. Extra-mesorectal Lymphadenopathy: Yes, with superior rectal node at the upper margin of the S1 level (image 19/2) Other: Urinary Tract: Urinary bladder with smooth contours, no visible lesion. No distal ureteral dilation. Note that tumor comes in close proximity to but does not appear to directly involve the urinary bladder (image 13/7 tumor extending from the anterior rectum towards the urinary bladder does not appear to contact the urinary bladder on sagittal images this is the area of suspected anterior peritoneal reflection involvement best seen on image 21/2 Bowel: As above Vascular/Lymphatic: As above Reproductive: Unremarkable. Other: No ascites, colonic diverticulosis and evidence of bilateral fat containing inguinal hernias. Musculoskeletal: On limited imaging of the pelvis no acute bony abnormality. IMPRESSION: 1. Circumferential tumor at the rectosigmoid junction with eccentric thickening and extension beyond the muscularis, approximately 3 mm felt to involve the anterior peritoneal reflection and associated with multiple pelvic lymph nodes, T4aN2, with suspected metastatic disease to the liver based on liver MRI. Electronically Signed   By: GZetta BillsM.D.   On: 11/14/2021 08:13   MR Abdomen W Wo Contrast  Result Date: 11/14/2021 CLINICAL DATA:  Rectal cancer.  Liver lesion on recent CT. EXAM: MRI ABDOMEN WITHOUT AND WITH CONTRAST TECHNIQUE: Multiplanar multisequence MR imaging of the abdomen was  performed both before and after the administration of intravenous contrast. CONTRAST:  173mGADAVIST GADOBUTROL 1 MMOL/ML IV SOLN COMPARISON:  CT scan 11/03/2021 FINDINGS: Lower chest: Unremarkable. Hepatobiliary: 3.0 x 3.4 x 3.0 cm subcapsular lesion posterior right liver (segment VII) restricts  diffusion and shows heterogeneous peripheral rim enhancement after IV contrast administration. Imaging features highly concerning for metastatic lesion given the patient's known primary rectal malignancy. No other suspicious liver lesion evident. There is no evidence for gallstones, gallbladder wall thickening, or pericholecystic fluid. No intrahepatic or extrahepatic biliary dilation. Pancreas: No focal mass lesion. No dilatation of the main duct. No intraparenchymal cyst. No peripancreatic edema. Spleen:  No splenomegaly. No focal mass lesion. Adrenals/Urinary Tract: No adrenal nodule or mass. Kidneys unremarkable. Stomach/Bowel: Stomach is unremarkable. No gastric wall thickening. No evidence of outlet obstruction. Duodenum is normally positioned as is the ligament of Treitz. No small bowel or colonic dilatation within the visualized abdomen. Vascular/Lymphatic: No abdominal aortic aneurysm. No abdominal lymphadenopathy Other:  No intraperitoneal free fluid. Musculoskeletal: No focal suspicious marrow enhancement within the visualized bony anatomy. IMPRESSION: 3.0 x 3.4 x 3.0 cm subcapsular lesion posterior right liver (segment VII) highly concerning for metastatic lesion given the patient's known primary rectal malignancy. No other evidence for metastatic disease in the abdomen. Electronically Signed   By: Misty Stanley M.D.   On: 11/14/2021 07:31   CT CHEST ABDOMEN PELVIS W CONTRAST  Result Date: 11/03/2021 CLINICAL DATA:  New diagnosis colon cancer staging * Tracking Code: BO * EXAM: CT CHEST, ABDOMEN, AND PELVIS WITH CONTRAST TECHNIQUE: Multidetector CT imaging of the chest, abdomen and pelvis was performed  following the standard protocol during bolus administration of intravenous contrast. RADIATION DOSE REDUCTION: This exam was performed according to the departmental dose-optimization program which includes automated exposure control, adjustment of the mA and/or kV according to patient size and/or use of iterative reconstruction technique. CONTRAST:  154m OMNIPAQUE IOHEXOL 300 MG/ML SOLN, additional oral enteric contrast COMPARISON:  None Available. FINDINGS: CT CHEST FINDINGS Cardiovascular: Aortic atherosclerosis. Normal heart size. Left coronary artery calcifications. No pericardial effusion. Mediastinum/Nodes: No enlarged mediastinal, hilar, or axillary lymph nodes. Thyroid gland, trachea, and esophagus demonstrate no significant findings. Lungs/Pleura: Lungs are clear. No pleural effusion or pneumothorax. Musculoskeletal: No chest wall mass or suspicious osseous lesions identified. CT ABDOMEN PELVIS FINDINGS Hepatobiliary: Hypodense lesion of the posterior right lobe of the liver, hepatic segment VII, measuring 2.4 x 2.9 cm (series 2, image 53). Hepatomegaly, maximum coronal span 21.0 cm. Hepatic steatosis. Tiny gallstones calcific sludge in the dependent gallbladder. No gallbladder wall thickening, or biliary dilatation. Pancreas: Unremarkable. No pancreatic ductal dilatation or surrounding inflammatory changes. Spleen: Normal in size without significant abnormality. Adrenals/Urinary Tract: Adrenal glands are unremarkable. Kidneys are normal, without renal calculi, solid lesion, or hydronephrosis. Bladder is unremarkable. Stomach/Bowel: Stomach is within normal limits. Appendix not clearly visualized. Circumferential wall thickening of the rectum (series 2, image 119). Descending and sigmoid diverticulosis. Vascular/Lymphatic: Aortic atherosclerosis. No enlarged abdominal or pelvic lymph nodes. Reproductive: Prostatomegaly. Other: Small, fat containing bilateral inguinal hernias. No ascites. Musculoskeletal:  No acute osseous findings. IMPRESSION: 1. Circumferential wall thickening of the rectum, in keeping with primary rectal malignancy. Correlate colonoscopy findings. 2. Lesion of the posterior right lobe of the liver, suspicious for hepatic metastasis although incompletely characterized. Recommend multiphasic contrast enhanced MRI to further evaluate. 3. No other evidence of lymphadenopathy or metastatic disease in the chest, abdomen, or pelvis. 4. Hepatomegaly and hepatic steatosis. 5. Prostatomegaly. 6. Coronary artery disease. Aortic Atherosclerosis (ICD10-I70.0). Electronically Signed   By: ADelanna AhmadiM.D.   On: 11/03/2021 15:08    Labs:  CBC: Recent Labs    02/07/21 1006 02/23/21 0900 08/11/21 0836 11/25/21 1336  WBC 5.4 5.3 5.6 6.5  HGB 17.9*  17.5* 17.3 16.4  HCT 52.4* 54.3* 51.4* 49.4  PLT 171 157 180 174    COAGS: Recent Labs    02/23/21 0900  INR 1.0  APTT 31    BMP: Recent Labs    02/07/21 1006 02/23/21 0900 08/11/21 0836 11/03/21 0832 11/25/21 1336  NA 139 136 137  --  139  K 4.9 4.2 5.1  --  4.4  CL 102 104 103  --  106  CO2 '20 23 21  '$ --  23  GLUCOSE 135* 171* 121*  --  173*  BUN '16 14 20  '$ --  24*  CALCIUM 9.8 9.3 9.6  --  9.6  CREATININE 1.05 0.93 1.00 1.00 0.97  GFRNONAA  --  >60  --   --  >60    LIVER FUNCTION TESTS: Recent Labs    02/07/21 1006 08/11/21 0836 11/25/21 1336  BILITOT 0.5 0.6 0.5  AST '23 20 15  '$ ALT 40 32 27  ALKPHOS 76 70 57  PROT 7.4 7.1 7.4  ALBUMIN 5.0* 5.0* 4.4    TUMOR MARKERS: Recent Labs    11/25/21 1336  CEA 6.18*    Assessment and Plan: 61 y.o. male with past medical history significant for arthritis, diabetes, GERD, and newly diagnosed rectal cancer who presents today for Port-A-Cath placement for chemotherapy.Risks and benefits of image guided port-a-catheter placement was discussed with the patient including, but not limited to bleeding, infection, pneumothorax, or fibrin sheath development and need for  additional procedures.  All of the patient's questions were answered, patient is agreeable to proceed. Consent signed and in chart.    Thank you for this interesting consult.  I greatly enjoyed meeting [name] [name] and look forward to participating in their care.  A copy of this report was sent to the requesting provider on this date.  Electronically Signed: D. Rowe Robert, PA-C 11/28/2021, 1:09 PM   I spent a total of  25 minutes   in face to face in clinical consultation, greater than 50% of which was counseling/coordinating care for port a cath placement

## 2021-11-28 NOTE — Procedures (Signed)
Vascular and Interventional Radiology Procedure Note  Patient: [name] [name] DOB: [birth year] Medical Record Number: [id number] Note Date/Time: 11/28/21 2:10 PM   Performing Physician: Michaelle Birks, MD Assistant(s): None  Diagnosis: Colon cancer  Procedure: PORT PLACEMENT  Anesthesia: Conscious Sedation Complications: None Estimated Blood Loss: Minimal  Findings:  Successful right-sided port placement, with the tip of the catheter in the proximal right atrium.  Plan: Catheter ready for use.  See detailed procedure note with images in PACS. The patient tolerated the procedure well without incident or complication and was returned to Short Stay in stable condition.    Michaelle Birks, MD [facility]   Pager. [place]

## 2021-11-29 NOTE — Progress Notes (Signed)
Pharmacist Chemotherapy Monitoring - Initial Assessment    Anticipated start date: 11/30/21   The following has been reviewed per standard work regarding the patient's treatment regimen: The patient's diagnosis, treatment plan and drug doses, and organ/hematologic function Lab orders and baseline tests specific to treatment regimen  The treatment plan start date, drug sequencing, and pre-medications Prior authorization status  Patient's documented medication list, including drug-drug interaction screen and prescriptions for anti-emetics and supportive care specific to the treatment regimen The drug concentrations, fluid compatibility, administration routes, and timing of the medications to be used The patient's access for treatment and lifetime cumulative dose history, if applicable  The patient's medication allergies and previous infusion related reactions, if applicable   Changes made to treatment plan:  N/A  Follow up needed:  N/A   Patrica Duel, [facility], 11/29/2021  4:36 PM

## 2021-11-30 ENCOUNTER — Inpatient Hospital Stay: Payer: Commercial Managed Care - PPO | Admitting: Nurse Practitioner

## 2021-11-30 ENCOUNTER — Encounter: Payer: Self-pay | Admitting: *Deleted

## 2021-11-30 ENCOUNTER — Inpatient Hospital Stay: Payer: Commercial Managed Care - PPO

## 2021-11-30 ENCOUNTER — Encounter: Payer: Self-pay | Admitting: Nurse Practitioner

## 2021-11-30 VITALS — BP 130/82 | HR 100

## 2021-11-30 VITALS — BP 118/84 | HR 93 | Temp 98.1°F | Resp 18 | Wt 264.8 lb

## 2021-11-30 DIAGNOSIS — C20 Malignant neoplasm of rectum: Secondary | ICD-10-CM

## 2021-11-30 DIAGNOSIS — Z5111 Encounter for antineoplastic chemotherapy: Secondary | ICD-10-CM | POA: Diagnosis not present

## 2021-11-30 MED ORDER — SODIUM CHLORIDE 0.9 % IV SOLN
2420.0000 mg/m2 | INTRAVENOUS | Status: DC
  Administered 2021-11-30: 6000 mg via INTRAVENOUS
  Filled 2021-11-30: qty 100

## 2021-11-30 MED ORDER — LEUCOVORIN CALCIUM INJECTION 350 MG
400.0000 mg/m2 | Freq: Once | INTRAVENOUS | Status: AC
  Administered 2021-11-30: 988 mg via INTRAVENOUS
  Filled 2021-11-30: qty 49.4

## 2021-11-30 MED ORDER — SODIUM CHLORIDE 0.9 % IV SOLN
10.0000 mg | Freq: Once | INTRAVENOUS | Status: AC
  Administered 2021-11-30: 10 mg via INTRAVENOUS
  Filled 2021-11-30: qty 10

## 2021-11-30 MED ORDER — FLUOROURACIL CHEMO INJECTION 2.5 GM/50ML
400.0000 mg/m2 | Freq: Once | INTRAVENOUS | Status: AC
  Administered 2021-11-30: 1000 mg via INTRAVENOUS
  Filled 2021-11-30: qty 20

## 2021-11-30 MED ORDER — DEXTROSE 5 % IV SOLN: Freq: Once | INTRAVENOUS | Status: AC

## 2021-11-30 MED ORDER — PALONOSETRON HCL INJECTION 0.25 MG/5ML
0.2500 mg | Freq: Once | INTRAVENOUS | Status: AC
  Administered 2021-11-30: 0.25 mg via INTRAVENOUS
  Filled 2021-11-30: qty 5

## 2021-11-30 MED ORDER — OXALIPLATIN CHEMO INJECTION 100 MG/20ML
81.0000 mg/m2 | Freq: Once | INTRAVENOUS | Status: AC
  Administered 2021-11-30: 200 mg via INTRAVENOUS
  Filled 2021-11-30: qty 40

## 2021-11-30 NOTE — Progress Notes (Signed)
Patient seen by Ned Card NP today  Vitals are within treatment parameters.  Labs reviewed by Ned Card NP and are within treatment parameters.  OK to treat based on labs collected on 11/25/21  Per physician team, patient is ready for treatment and there are NO modifications to the treatment plan.
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
## 2021-11-30 NOTE — Patient Instructions (Signed)
[place]   The chemotherapy medication bag should finish at 46 hours, 96 hours, or 7 days. For example, if your pump is scheduled for 46 hours and it was put on at 4:00 p.m., it should finish at 2:00 p.m. the day it is scheduled to come off regardless of your appointment time.     Estimated time to finish at 12:30 Friday, December 02, 2021.   If the display on your pump reads "Low Volume" and it is beeping, take the batteries out of the pump and come to the cancer center for it to be taken off.   If the pump alarms go off prior to the pump reading "Low Volume" then call [phone] and someone can assist you.  If the plunger comes out and the chemotherapy medication is leaking out, please use your home chemo spill kit to clean up the spill. Do NOT use paper towels or other household products.  If you have problems or questions regarding your pump, please call either [phone] (24 hours a day) or the cancer center Monday-Friday 8:00 a.m.- 4:30 p.m. at the clinic number and we will assist you. If you are unable to get assistance, then go to the nearest Emergency Department and ask the staff to contact the IV team for assistance.  Discharge Instructions: Thank you for choosing [facility] to provide your oncology and hematology care.   If you have a lab appointment with the [facility], please go directly to the [facility] and check in at the registration area.   Wear comfortable clothing and clothing appropriate for easy access to any Portacath or PICC line.   We strive to give you quality time with your provider. You may need to reschedule your appointment if you arrive late (15 or more minutes).  Arriving late affects you and other patients whose appointments are after yours.  Also, if you miss three or more appointments without notifying the office, you may be dismissed from the clinic at the provider's discretion.      For prescription  refill requests, have your pharmacy contact our office and allow 72 hours for refills to be completed.    Today you received the following chemotherapy and/or immunotherapy agents Oxaliplatin, Leucovorin, Fluorouracil.      To help prevent nausea and vomiting after your treatment, we encourage you to take your nausea medication as directed.  BELOW ARE SYMPTOMS THAT SHOULD BE REPORTED IMMEDIATELY: *FEVER GREATER THAN 100.4 F (38 C) OR HIGHER *CHILLS OR SWEATING *NAUSEA AND VOMITING THAT IS NOT CONTROLLED WITH YOUR NAUSEA MEDICATION *UNUSUAL SHORTNESS OF BREATH *UNUSUAL BRUISING OR BLEEDING *URINARY PROBLEMS (pain or burning when urinating, or frequent urination) *BOWEL PROBLEMS (unusual diarrhea, constipation, pain near the anus) TENDERNESS IN MOUTH AND THROAT WITH OR WITHOUT PRESENCE OF ULCERS (sore throat, sores in mouth, or a toothache) UNUSUAL RASH, SWELLING OR PAIN  UNUSUAL VAGINAL DISCHARGE OR ITCHING   Items with * indicate a potential emergency and should be followed up as soon as possible or go to the Emergency Department if any problems should occur.  Please show the CHEMOTHERAPY ALERT CARD or IMMUNOTHERAPY ALERT CARD at check-in to the Emergency Department and triage nurse.  Should you have questions after your visit or need to cancel or reschedule your appointment, please contact [facility]  Dept: [phone]  and follow the prompts.  Office hours are 8:00 a.m. to 4:30 p.m. Monday - Friday. Please note that voicemails  left after 4:00 p.m. may not be returned until the following business day.  We are closed weekends and major holidays. You have access to a nurse at all times for urgent questions. Please call the main number to the clinic Dept: [phone] and follow the prompts.   For any non-urgent questions, you may also contact your provider using MyChart. We now offer e-Visits for anyone 36 and older to request care online for non-urgent  symptoms. For details visit [email or web address].   Also download the MyChart app! Go to the app store, search "MyChart", open the app, select [facility], and log in with your MyChart username and password.  Due to Covid, a mask is required upon entering the hospital/clinic. If you do not have a mask, one will be given to you upon arrival. For doctor visits, patients may have 1 support person aged 2 or older with them. For treatment visits, patients cannot have anyone with them due to current Covid guidelines and our immunocompromised population.   Oxaliplatin Injection What is this medication? OXALIPLATIN (ox AL i PLA tin) is a chemotherapy drug. It targets fast dividing cells, like cancer cells, and causes these cells to die. This medicine is used to treat cancers of the colon and rectum, and many other cancers. This medicine may be used for other purposes; ask your health care provider or pharmacist if you have questions. COMMON BRAND NAME(S): Eloxatin What should I tell my care team before I take this medication? They need to know if you have any of these conditions: heart disease history of irregular heartbeat liver disease low blood counts, like white cells, platelets, or red blood cells lung or breathing disease, like asthma take medicines that treat or prevent blood clots tingling of the fingers or toes, or other nerve disorder an unusual or allergic reaction to oxaliplatin, other chemotherapy, other medicines, foods, dyes, or preservatives pregnant or trying to get pregnant breast-feeding How should I use this medication? This drug is given as an infusion into a vein. It is administered in a hospital or clinic by a specially trained health care professional. Talk to your pediatrician regarding the use of this medicine in children. Special care may be needed. Overdosage: If you think you have taken too much of this medicine contact a poison control center or emergency room at  once. NOTE: This medicine is only for you. Do not share this medicine with others. What if I miss a dose? It is important not to miss a dose. Call your doctor or health care professional if you are unable to keep an appointment. What may interact with this medication? Do not take this medicine with any of the following medications: cisapride dronedarone pimozide thioridazine This medicine may also interact with the following medications: aspirin and aspirin-like medicines certain medicines that treat or prevent blood clots like warfarin, apixaban, dabigatran, and rivaroxaban cisplatin cyclosporine diuretics medicines for infection like acyclovir, adefovir, amphotericin B, bacitracin, cidofovir, foscarnet, ganciclovir, gentamicin, pentamidine, vancomycin NSAIDs, medicines for pain and inflammation, like ibuprofen or naproxen other medicines that prolong the QT interval (an abnormal heart rhythm) pamidronate zoledronic acid This list may not describe all possible interactions. Give your health care provider a list of all the medicines, herbs, non-prescription drugs, or dietary supplements you use. Also tell them if you smoke, drink alcohol, or use illegal drugs. Some items may interact with your medicine. What should I watch for while using this medication? Your condition will be monitored carefully while you are receiving this  medicine. You may need blood work done while you are taking this medicine. This medicine may make you feel generally unwell. This is not uncommon as chemotherapy can affect healthy cells as well as cancer cells. Report any side effects. Continue your course of treatment even though you feel ill unless your healthcare professional tells you to stop. This medicine can make you more sensitive to cold. Do not drink cold drinks or use ice. Cover exposed skin before coming in contact with cold temperatures or cold objects. When out in cold weather wear warm clothing and  cover your mouth and nose to warm the air that goes into your lungs. Tell your doctor if you get sensitive to the cold. Do not become pregnant while taking this medicine or for 9 months after stopping it. Women should inform their health care professional if they wish to become pregnant or think they might be pregnant. Men should not father a child while taking this medicine and for 6 months after stopping it. There is potential for serious side effects to an unborn child. Talk to your health care professional for more information. Do not breast-feed a child while taking this medicine or for 3 months after stopping it. This medicine has caused ovarian failure in some women. This medicine may make it more difficult to get pregnant. Talk to your health care professional if you are concerned about your fertility. This medicine has caused decreased sperm counts in some men. This may make it more difficult to father a child. Talk to your health care professional if you are concerned about your fertility. This medicine may increase your risk of getting an infection. Call your health care professional for advice if you get a fever, chills, or sore throat, or other symptoms of a cold or flu. Do not treat yourself. Try to avoid being around people who are sick. Avoid taking medicines that contain aspirin, acetaminophen, ibuprofen, naproxen, or ketoprofen unless instructed by your health care professional. These medicines may hide a fever. Be careful brushing or flossing your teeth or using a toothpick because you may get an infection or bleed more easily. If you have any dental work done, tell your dentist you are receiving this medicine. What side effects may I notice from receiving this medication? Side effects that you should report to your doctor or health care professional as soon as possible: allergic reactions like skin rash, itching or hives, swelling of the face, lips, or tongue breathing  problems cough low blood counts - this medicine may decrease the number of white blood cells, red blood cells, and platelets. You may be at increased risk for infections and bleeding nausea, vomiting pain, redness, or irritation at site where injected pain, tingling, numbness in the hands or feet signs and symptoms of bleeding such as bloody or black, tarry stools; red or dark brown urine; spitting up blood or brown material that looks like coffee grounds; red spots on the skin; unusual bruising or bleeding from the eyes, gums, or nose signs and symptoms of a dangerous change in heartbeat or heart rhythm like chest pain; dizziness; fast, irregular heartbeat; palpitations; feeling faint or lightheaded; falls signs and symptoms of infection like fever; chills; cough; sore throat; pain or trouble passing urine signs and symptoms of liver injury like dark yellow or brown urine; general ill feeling or flu-like symptoms; light-colored stools; loss of appetite; nausea; right upper belly pain; unusually weak or tired; yellowing of the eyes or skin signs and symptoms of  low red blood cells or anemia such as unusually weak or tired; feeling faint or lightheaded; falls signs and symptoms of muscle injury like dark urine; trouble passing urine or change in the amount of urine; unusually weak or tired; muscle pain; back pain Side effects that usually do not require medical attention (report to your doctor or health care professional if they continue or are bothersome): changes in taste diarrhea gas hair loss loss of appetite mouth sores This list may not describe all possible side effects. Call your doctor for medical advice about side effects. You may report side effects to FDA at 1-800-FDA-1088. Where should I keep my medication? This drug is given in a hospital or clinic and will not be stored at home. NOTE: This sheet is a summary. It may not cover all possible information. If you have questions about  this medicine, talk to your doctor, pharmacist, or health care provider.  2023 Elsevier/Gold Standard (2021-05-13 00:00:00)  Leucovorin injection What is this medication? LEUCOVORIN (loo koe VOR in) is used to prevent or treat the harmful effects of some medicines. This medicine is used to treat anemia caused by a low amount of folic acid in the body. It is also used with 5-fluorouracil (5-FU) to treat colon cancer. This medicine may be used for other purposes; ask your health care provider or pharmacist if you have questions. What should I tell my care team before I take this medication? They need to know if you have any of these conditions: anemia from low levels of vitamin B-12 in the blood an unusual or allergic reaction to leucovorin, folic acid, other medicines, foods, dyes, or preservatives pregnant or trying to get pregnant breast-feeding How should I use this medication? This medicine is for injection into a muscle or into a vein. It is given by a health care professional in a hospital or clinic setting. Talk to your pediatrician regarding the use of this medicine in children. Special care may be needed. Overdosage: If you think you have taken too much of this medicine contact a poison control center or emergency room at once. NOTE: This medicine is only for you. Do not share this medicine with others. What if I miss a dose? This does not apply. What may interact with this medication? capecitabine fluorouracil phenobarbital phenytoin primidone trimethoprim-sulfamethoxazole This list may not describe all possible interactions. Give your health care provider a list of all the medicines, herbs, non-prescription drugs, or dietary supplements you use. Also tell them if you smoke, drink alcohol, or use illegal drugs. Some items may interact with your medicine. What should I watch for while using this medication? Your condition will be monitored carefully while you are receiving this  medicine. This medicine may increase the side effects of 5-fluorouracil, 5-FU. Tell your doctor or health care professional if you have diarrhea or mouth sores that do not get better or that get worse. What side effects may I notice from receiving this medication? Side effects that you should report to your doctor or health care professional as soon as possible: allergic reactions like skin rash, itching or hives, swelling of the face, lips, or tongue breathing problems fever, infection mouth sores unusual bleeding or bruising unusually weak or tired Side effects that usually do not require medical attention (report to your doctor or health care professional if they continue or are bothersome): constipation or diarrhea loss of appetite nausea, vomiting This list may not describe all possible side effects. Call your doctor for medical  advice about side effects. You may report side effects to FDA at 1-800-FDA-1088. Where should I keep my medication? This drug is given in a hospital or clinic and will not be stored at home. NOTE: This sheet is a summary. It may not cover all possible information. If you have questions about this medicine, talk to your doctor, pharmacist, or health care provider.  2023 Elsevier/Gold Standard (2007-12-19 00:00:00)  Fluorouracil, 5-FU injection What is this medication? FLUOROURACIL, 5-FU (flure oh YOOR a sil) is a chemotherapy drug. It slows the growth of cancer cells. This medicine is used to treat many types of cancer like breast cancer, colon or rectal cancer, pancreatic cancer, and stomach cancer. This medicine may be used for other purposes; ask your health care provider or pharmacist if you have questions. COMMON BRAND NAME(S): Adrucil What should I tell my care team before I take this medication? They need to know if you have any of these conditions: blood disorders dihydropyrimidine dehydrogenase (DPD) deficiency infection (especially a virus  infection such as chickenpox, cold sores, or herpes) kidney disease liver disease malnourished, poor nutrition recent or ongoing radiation therapy an unusual or allergic reaction to fluorouracil, other chemotherapy, other medicines, foods, dyes, or preservatives pregnant or trying to get pregnant breast-feeding How should I use this medication? This drug is given as an infusion or injection into a vein. It is administered in a hospital or clinic by a specially trained health care professional. Talk to your pediatrician regarding the use of this medicine in children. Special care may be needed. Overdosage: If you think you have taken too much of this medicine contact a poison control center or emergency room at once. NOTE: This medicine is only for you. Do not share this medicine with others. What if I miss a dose? It is important not to miss your dose. Call your doctor or health care professional if you are unable to keep an appointment. What may interact with this medication? Do not take this medicine with any of the following medications: live virus vaccines This medicine may also interact with the following medications: medicines that treat or prevent blood clots like warfarin, enoxaparin, and dalteparin This list may not describe all possible interactions. Give your health care provider a list of all the medicines, herbs, non-prescription drugs, or dietary supplements you use. Also tell them if you smoke, drink alcohol, or use illegal drugs. Some items may interact with your medicine. What should I watch for while using this medication? Visit your doctor for checks on your progress. This drug may make you feel generally unwell. This is not uncommon, as chemotherapy can affect healthy cells as well as cancer cells. Report any side effects. Continue your course of treatment even though you feel ill unless your doctor tells you to stop. In some cases, you may be given additional medicines to  help with side effects. Follow all directions for their use. Call your doctor or health care professional for advice if you get a fever, chills or sore throat, or other symptoms of a cold or flu. Do not treat yourself. This drug decreases your body's ability to fight infections. Try to avoid being around people who are sick. This medicine may increase your risk to bruise or bleed. Call your doctor or health care professional if you notice any unusual bleeding. Be careful brushing and flossing your teeth or using a toothpick because you may get an infection or bleed more easily. If you have any dental work done,  tell your dentist you are receiving this medicine. Avoid taking products that contain aspirin, acetaminophen, ibuprofen, naproxen, or ketoprofen unless instructed by your doctor. These medicines may hide a fever. Do not become pregnant while taking this medicine. Women should inform their doctor if they wish to become pregnant or think they might be pregnant. There is a potential for serious side effects to an unborn child. Talk to your health care professional or pharmacist for more information. Do not breast-feed an infant while taking this medicine. Men should inform their doctor if they wish to father a child. This medicine may lower sperm counts. Do not treat diarrhea with over the counter products. Contact your doctor if you have diarrhea that lasts more than 2 days or if it is severe and watery. This medicine can make you more sensitive to the sun. Keep out of the sun. If you cannot avoid being in the sun, wear protective clothing and use sunscreen. Do not use sun lamps or tanning beds/booths. What side effects may I notice from receiving this medication? Side effects that you should report to your doctor or health care professional as soon as possible: allergic reactions like skin rash, itching or hives, swelling of the face, lips, or tongue low blood counts - this medicine may decrease  the number of white blood cells, red blood cells and platelets. You may be at increased risk for infections and bleeding. signs of infection - fever or chills, cough, sore throat, pain or difficulty passing urine signs of decreased platelets or bleeding - bruising, pinpoint red spots on the skin, black, tarry stools, blood in the urine signs of decreased red blood cells - unusually weak or tired, fainting spells, lightheadedness breathing problems changes in vision chest pain mouth sores nausea and vomiting pain, swelling, redness at site where injected pain, tingling, numbness in the hands or feet redness, swelling, or sores on hands or feet stomach pain unusual bleeding Side effects that usually do not require medical attention (report to your doctor or health care professional if they continue or are bothersome): changes in finger or toe nails diarrhea dry or itchy skin hair loss headache loss of appetite sensitivity of eyes to the light stomach upset unusually teary eyes This list may not describe all possible side effects. Call your doctor for medical advice about side effects. You may report side effects to FDA at 1-800-FDA-1088. Where should I keep my medication? This drug is given in a hospital or clinic and will not be stored at home. NOTE: This sheet is a summary. It may not cover all possible information. If you have questions about this medicine, talk to your doctor, pharmacist, or health care provider.  2023 Elsevier/Gold Standard (2021-05-13 00:00:00)

## 2021-12-01 ENCOUNTER — Telehealth: Payer: Self-pay | Admitting: Emergency Medicine

## 2021-12-01 ENCOUNTER — Encounter: Payer: Self-pay | Admitting: *Deleted

## 2021-12-01 ENCOUNTER — Telehealth: Payer: Self-pay | Admitting: *Deleted

## 2021-12-01 NOTE — Telephone Encounter (Signed)
24 Hour Callback 24 Hour callback post 1st time [facility]. Pt denies any N/V/D cold sensitivity noted. Pt did report small amount of blood on guaze above post access. Will continue to monitor. Pt had no questions or concerns. Pt reminded to call with concerns or questions.

## 2021-12-01 NOTE — Telephone Encounter (Signed)
error 

## 2021-12-01 NOTE — Progress Notes (Signed)
PATIENT NAVIGATOR PROGRESS NOTE  Name: [name] [name] Date: 12/01/2021 MRN: [id number]  DOB: 02/26/1961   Reason for visit:  Telephone call after first treatment  Comments:  Called and spoke with patient after first treatment, he tolerated well, is eating and drinking today without difficulty.   Reports some oozing at [facility] incision, describes as old dried blood, denies need to come in to office and will send message via mychart if necessary to evaluate    Time spent counseling/coordinating care: 30-45 minutes

## 2021-12-02 ENCOUNTER — Encounter: Payer: Self-pay | Admitting: Genetic Counselor

## 2021-12-02 ENCOUNTER — Ambulatory Visit: Payer: Self-pay | Admitting: Genetic Counselor

## 2021-12-02 ENCOUNTER — Telehealth: Payer: Self-pay | Admitting: Genetic Counselor

## 2021-12-02 ENCOUNTER — Inpatient Hospital Stay: Payer: Commercial Managed Care - PPO

## 2021-12-02 VITALS — BP 146/95 | HR 88 | Temp 98.0°F | Resp 18

## 2021-12-02 DIAGNOSIS — C20 Malignant neoplasm of rectum: Secondary | ICD-10-CM

## 2021-12-02 DIAGNOSIS — Z1379 Encounter for other screening for genetic and chromosomal anomalies: Secondary | ICD-10-CM | POA: Insufficient documentation

## 2021-12-02 DIAGNOSIS — Z5111 Encounter for antineoplastic chemotherapy: Secondary | ICD-10-CM | POA: Diagnosis not present

## 2021-12-02 MED ORDER — SODIUM CHLORIDE 0.9% FLUSH
10.0000 mL | INTRAVENOUS | Status: DC | PRN
  Administered 2021-12-02: 10 mL

## 2021-12-02 MED ORDER — HEPARIN SOD (PORK) LOCK FLUSH 100 UNIT/ML IV SOLN
500.0000 [IU] | Freq: Once | INTRAVENOUS | Status: AC | PRN
  Administered 2021-12-02: 500 [IU]

## 2021-12-02 NOTE — Progress Notes (Signed)
HPI:  [name] [name] was previously seen in the [place] clinic due to a personal and family history of colorectal cancer and concerns regarding a hereditary predisposition to cancer. Please refer to our prior cancer genetics clinic note for more information regarding our discussion, assessment and recommendations, at the time. [name] [name] recent genetic test results were disclosed to him, as were recommendations warranted by these results. These results and recommendations are discussed in more detail below.  CANCER HISTORY:  Oncology History  Rectal cancer ([place])  11/09/2021 Initial Diagnosis   Colon cancer ([place])   11/18/2021 Cancer Staging   Staging form: Colon and Rectum, AJCC 8th Edition - Clinical: cT4a, cN2, cM1 - Signed by Ladell Pier, MD on 11/18/2021   11/30/2021 -  Chemotherapy   Patient is on Treatment Plan : COLORECTAL FOLFOX q14d     12/01/2021 Genetic Testing   Negative genetic testing on the CancerNext-Expanded+RNAinsight panel. CTNNA1  p.I175V (c.523A>G) VUS identified.  The report date is December 01, 2021.  The CancerNext-Expanded gene panel offered by [facility] and includes sequencing and rearrangement analysis for the following 77 genes: AIP, ALK, APC*, ATM*, AXIN2, BAP1, BARD1, BLM, BMPR1A, BRCA1*, BRCA2*, BRIP1*, CDC73, CDH1*, CDK4, CDKN1B, CDKN2A, CHEK2*, CTNNA1, DICER1, FANCC, FH, FLCN, GALNT12, KIF1B, LZTR1, MAX, MEN1, MET, MLH1*, MSH2*, MSH3, MSH6*, MUTYH*, NBN, NF1*, NF2, NTHL1, PALB2*, PHOX2B, PMS2*, POT1, PRKAR1A, PTCH1, PTEN*, RAD51C*, RAD51D*, RB1, RECQL, RET, SDHA, SDHAF2, SDHB, SDHC, SDHD, SMAD4, SMARCA4, SMARCB1, SMARCE1, STK11, SUFU, TMEM127, TP53*, TSC1, TSC2, VHL and XRCC2 (sequencing and deletion/duplication); EGFR, EGLN1, HOXB13, KIT, MITF, PDGFRA, POLD1, and POLE (sequencing only); EPCAM and GREM1 (deletion/duplication only). DNA and RNA analyses performed for * genes.      FAMILY HISTORY:  We obtained a detailed, 4-generation  family history.  Significant diagnoses are listed below: Family History  Problem Relation Age of Onset   Diabetes Mother    Breast cancer Mother 49   Diabetes Father        type II   Colon polyps Sister        11+ polyps   Other Brother        reportedly negative genetic testing   Diabetes Brother    Colon cancer Maternal Aunt    Colon cancer Maternal Uncle        x2   Colon cancer Maternal Uncle        x3   Colon cancer Maternal Uncle    Colon cancer Maternal Uncle    Colon cancer Maternal Uncle    Colon cancer Maternal Uncle    Other Maternal Uncle        farm accident as a teen   Colon cancer Maternal Grandmother    Cancer Maternal Grandmother        breast, colon    Diabetes Paternal Grandfather    Colon cancer Cousin        <50   Colon cancer Cousin        maternal cousin's children   Esophageal cancer Neg Hx    Rectal cancer Neg Hx    Stomach cancer Neg Hx        The patient has one son who is cancer free.  He has two brothers and a sister.  His sister reports having at least 54 colon polyps in her lifetime, and he reports that his brother has had negative genetic testing.  Both parents are deceased.   The patient's father is deceased from undisclosed reasons.  He has  an only child.  His parents are deceased from non-cancer related issues.   The patient's mother had breast cancer at 39 and died at 37.  She had one sister and six brothers.  One brother died in an accident as a teen.  The remainder of his siblings all had colon cancer. One brother had two daughters, one daughter had colon cancer under age 45.  Both daughters have children with colon cancer, one had it in her 68's. The maternal grandmother died of colon cancer.  She had a sister who had two sons with colon cancer.  It is unknown if anyone in the family has had genetic testing.   [name] [name] is unaware of previous family history of genetic testing for hereditary cancer risks. Patient's maternal  ancestors are of Caucasian descent, and paternal ancestors are of [place] and [place] descent. There is no reported Ashkenazi Jewish ancestry. There is no known consanguinity.  GENETIC TEST RESULTS: Genetic testing reported out on December 01, 2021 through the CancerNext-Expanded+RNAinsight cancer panel found no pathogenic mutations. The CancerNext-Expanded gene panel offered by [facility] and includes sequencing and rearrangement analysis for the following 77 genes: AIP, ALK, APC*, ATM*, AXIN2, BAP1, BARD1, BLM, BMPR1A, BRCA1*, BRCA2*, BRIP1*, CDC73, CDH1*, CDK4, CDKN1B, CDKN2A, CHEK2*, CTNNA1, DICER1, FANCC, FH, FLCN, GALNT12, KIF1B, LZTR1, MAX, MEN1, MET, MLH1*, MSH2*, MSH3, MSH6*, MUTYH*, NBN, NF1*, NF2, NTHL1, PALB2*, PHOX2B, PMS2*, POT1, PRKAR1A, PTCH1, PTEN*, RAD51C*, RAD51D*, RB1, RECQL, RET, SDHA, SDHAF2, SDHB, SDHC, SDHD, SMAD4, SMARCA4, SMARCB1, SMARCE1, STK11, SUFU, TMEM127, TP53*, TSC1, TSC2, VHL and XRCC2 (sequencing and deletion/duplication); EGFR, EGLN1, HOXB13, KIT, MITF, PDGFRA, POLD1, and POLE (sequencing only); EPCAM and GREM1 (deletion/duplication only). DNA and RNA analyses performed for * genes. The test report has been scanned into EPIC and is located under the Molecular Pathology section of the Results Review tab.  A portion of the result report is included below for reference.     We discussed with [name] [name] that because current genetic testing is not perfect, it is possible there may be a gene mutation in one of these genes that current testing cannot detect, but that chance is small.  We also discussed, that there could be another gene that has not yet been discovered, or that we have not yet tested, that is responsible for the cancer diagnoses in the family. It is also possible there is a hereditary cause for the cancer in the family that Mr. Feig did not inherit and therefore was not identified in his testing.  Therefore, it is important to remain in touch with cancer  genetics in the future so that we can continue to offer [name] [name] the most up to date genetic testing.   Genetic testing did identify a variant of uncertain significance (VUS) was identified in the CTNNA1 gene called p.I175V.  At this time, it is unknown if this variant is associated with increased cancer risk or if this is a normal finding, but most variants such as this get reclassified to being inconsequential. It should not be used to make medical management decisions. With time, we suspect the lab will determine the significance of this variant, if any. If we do learn more about it, we will try to contact Mr. Zajicek to discuss it further. However, it is important to stay in touch with [place] periodically and keep the address and phone number up to date.  ADDITIONAL GENETIC TESTING: We discussed with Mr. Deyoung that his genetic testing was fairly extensive.  If there are  genes identified to increase cancer risk that can be analyzed in the future, we would be happy to discuss and coordinate this testing at that time.    CANCER SCREENING RECOMMENDATIONS: Mr. Larkey test result is considered negative (normal).  This means that we have not identified a hereditary cause for his personal and family history of colorectal cancer at this time. Most cancers happen by chance and this negative test suggests that his cancer may fall into this category.    While reassuring, this does not definitively rule out a hereditary predisposition to cancer. It is still possible that there could be genetic mutations that are undetectable by current technology. There could be genetic mutations in genes that have not been tested or identified to increase cancer risk.  Therefore, it is recommended he continue to follow the cancer management and screening guidelines provided by his oncology and primary healthcare provider.   An individual's cancer risk and medical management are not determined by genetic test  results alone. Overall cancer risk assessment incorporates additional factors, including personal medical history, family history, and any available genetic information that may result in a personalized plan for cancer prevention and surveillance  RECOMMENDATIONS FOR FAMILY MEMBERS:  Individuals in this family might be at some increased risk of developing cancer, over the general population risk, simply due to the family history of cancer.  We recommended women in this family have a yearly mammogram beginning at age 56, or 47 years younger than the earliest onset of cancer, an annual clinical breast exam, and perform monthly breast self-exams. Women in this family should also have a gynecological exam as recommended by their primary provider. All family members should be referred for colonoscopy starting at age 25.  It is also possible there is a hereditary cause for the cancer in Mr. Maceachern family that he did not inherit and therefore was not identified in him.  Based on Mr. Neuharth family history, we recommended his maternal cousin, who was diagnosed with colon cancer at under age 44, or his sister who has polyposis, have genetic counseling and testing. Mr. Littler will let us know if we can be of any assistance in coordinating genetic counseling and/or testing for this family member.   FOLLOW-UP: Lastly, we discussed with Mr. Klausing that cancer genetics is a rapidly advancing field and it is possible that new genetic tests will be appropriate for him and/or his family members in the future. We encouraged him to remain in contact with cancer genetics on an annual basis so we can update his personal and family histories and let him know of advances in cancer genetics that may benefit this family.   Our contact number was provided. Mr. Werts questions were answered to his satisfaction, and he knows he is welcome to call us at anytime with additional questions or concerns.   Roma Kayser, [place], [facility] Licensed, Certified Genetic Counselor [email or web address]

## 2021-12-02 NOTE — Progress Notes (Signed)
Minor bleeding at port needle insertion site.  Skin cleaned with NS soaked gauze.  No evidence of open wound at site.  Dry gauze secured with medipore tape at site.  Patient instructed to call office if bleeding continued at site.  Patient verbalized understanding.

## 2021-12-02 NOTE — Telephone Encounter (Signed)
Revealed negative genetic testing.  Discussed that we do not know why he has rectal cancer or why there is cancer in the family. It could be due to a different gene that we are not testing, or maybe our current technology may not be able to pick something up.  It will be important for him to keep in contact with genetics to keep up with whether additional testing may be needed.

## 2021-12-02 NOTE — Patient Instructions (Signed)

## 2021-12-11 ENCOUNTER — Other Ambulatory Visit: Payer: Self-pay | Admitting: Oncology

## 2021-12-14 ENCOUNTER — Encounter: Payer: Self-pay | Admitting: Nurse Practitioner

## 2021-12-14 ENCOUNTER — Telehealth: Payer: Self-pay

## 2021-12-14 ENCOUNTER — Inpatient Hospital Stay: Payer: Commercial Managed Care - PPO

## 2021-12-14 ENCOUNTER — Other Ambulatory Visit: Payer: Self-pay

## 2021-12-14 ENCOUNTER — Inpatient Hospital Stay: Payer: Commercial Managed Care - PPO | Admitting: Nurse Practitioner

## 2021-12-14 ENCOUNTER — Encounter: Payer: Self-pay | Admitting: *Deleted

## 2021-12-14 VITALS — BP 135/89 | HR 89 | Resp 18

## 2021-12-14 VITALS — BP 126/89 | HR 93 | Temp 98.2°F | Resp 18 | Ht 72.0 in | Wt 266.2 lb

## 2021-12-14 DIAGNOSIS — C20 Malignant neoplasm of rectum: Secondary | ICD-10-CM

## 2021-12-14 DIAGNOSIS — Z5111 Encounter for antineoplastic chemotherapy: Secondary | ICD-10-CM | POA: Diagnosis not present

## 2021-12-14 LAB — CBC WITH DIFFERENTIAL (CANCER CENTER ONLY)
Abs Immature Granulocytes: 0 10*3/uL (ref 0.00–0.07)
Basophils Absolute: 0.1 10*3/uL (ref 0.0–0.1)
Basophils Relative: 2 %
Eosinophils Absolute: 0.4 10*3/uL (ref 0.0–0.5)
Eosinophils Relative: 8 %
HCT: 47.8 % (ref 39.0–52.0)
Hemoglobin: 15.9 g/dL (ref 13.0–17.0)
Immature Granulocytes: 0 %
Lymphocytes Relative: 20 %
Lymphs Abs: 1.1 10*3/uL (ref 0.7–4.0)
MCH: 28.7 pg (ref 26.0–34.0)
MCHC: 33.3 g/dL (ref 30.0–36.0)
MCV: 86.3 fL (ref 80.0–100.0)
Monocytes Absolute: 0.4 10*3/uL (ref 0.1–1.0)
Monocytes Relative: 7 %
Neutro Abs: 3.4 10*3/uL (ref 1.7–7.7)
Neutrophils Relative %: 63 %
Platelet Count: 157 10*3/uL (ref 150–400)
RBC: 5.54 MIL/uL (ref 4.22–5.81)
RDW: 13.8 % (ref 11.5–15.5)
WBC Count: 5.5 10*3/uL (ref 4.0–10.5)
nRBC: 0 % (ref 0.0–0.2)

## 2021-12-14 LAB — CMP (CANCER CENTER ONLY)
ALT: 26 U/L (ref 0–44)
AST: 18 U/L (ref 15–41)
Albumin: 4.2 g/dL (ref 3.5–5.0)
Alkaline Phosphatase: 64 U/L (ref 38–126)
Anion gap: 11 (ref 5–15)
BUN: 16 mg/dL (ref 8–23)
CO2: 22 mmol/L (ref 22–32)
Calcium: 9.5 mg/dL (ref 8.9–10.3)
Chloride: 107 mmol/L (ref 98–111)
Creatinine: 1.11 mg/dL (ref 0.61–1.24)
GFR, Estimated: >60 mL/min (ref >60)
Glucose, Bld: 229 mg/dL — ABNORMAL HIGH (ref 70–99)
Potassium: 4.4 mmol/L (ref 3.5–5.1)
Sodium: 140 mmol/L (ref 135–145)
Total Bilirubin: 0.5 mg/dL (ref 0.3–1.2)
Total Protein: 6.9 g/dL (ref 6.5–8.1)

## 2021-12-14 MED ORDER — OXALIPLATIN CHEMO INJECTION 100 MG/20ML
81.0000 mg/m2 | Freq: Once | INTRAVENOUS | Status: AC
  Administered 2021-12-14: 200 mg via INTRAVENOUS
  Filled 2021-12-14: qty 40

## 2021-12-14 MED ORDER — SODIUM CHLORIDE 0.9 % IV SOLN
10.0000 mg | Freq: Once | INTRAVENOUS | Status: AC
  Administered 2021-12-14: 10 mg via INTRAVENOUS
  Filled 2021-12-14: qty 10

## 2021-12-14 MED ORDER — SODIUM CHLORIDE 0.9 % IV SOLN
150.0000 mg | Freq: Once | INTRAVENOUS | Status: AC
  Administered 2021-12-14: 150 mg via INTRAVENOUS
  Filled 2021-12-14: qty 150

## 2021-12-14 MED ORDER — SODIUM CHLORIDE 0.9 % IV SOLN
1920.0000 mg/m2 | INTRAVENOUS | Status: DC
  Administered 2021-12-14: 4750 mg via INTRAVENOUS
  Filled 2021-12-14: qty 95

## 2021-12-14 MED ORDER — PALONOSETRON HCL INJECTION 0.25 MG/5ML
0.2500 mg | Freq: Once | INTRAVENOUS | Status: AC
  Administered 2021-12-14: 0.25 mg via INTRAVENOUS
  Filled 2021-12-14: qty 5

## 2021-12-14 MED ORDER — DEXTROSE 5 % IV SOLN: Freq: Once | INTRAVENOUS | Status: AC

## 2021-12-14 MED ORDER — FLUOROURACIL CHEMO INJECTION 2.5 GM/50ML
300.0000 mg/m2 | Freq: Once | INTRAVENOUS | Status: AC
  Administered 2021-12-14: 750 mg via INTRAVENOUS
  Filled 2021-12-14: qty 15

## 2021-12-14 MED ORDER — LEUCOVORIN CALCIUM INJECTION 350 MG
300.0000 mg/m2 | Freq: Once | INTRAVENOUS | Status: AC
  Administered 2021-12-14: 742 mg via INTRAVENOUS
  Filled 2021-12-14: qty 25

## 2021-12-14 NOTE — Telephone Encounter (Signed)
Called in MMW at [facility]

## 2021-12-14 NOTE — Progress Notes (Signed)
The proposed treatment discussed in conference is for discussion purpose only and is not a binding recommendation.  The patients have not been physically examined, or presented with their treatment options.  Therefore, final treatment plans cannot be decided.  

## 2021-12-14 NOTE — Patient Instructions (Addendum)
[facility]   The chemotherapy medication bag should finish at 46 hours, 96 hours, or 7 days. For example, if your pump is scheduled for 46 hours and it was put on at 4:00 p.m., it should finish at 2:00 p.m. the day it is scheduled to come off regardless of your appointment time.     Estimated time to finish at 11:30am Friday, December 16, 2021.   If the display on your pump reads "Low Volume" and it is beeping, take the batteries out of the pump and come to the cancer center for it to be taken off.   If the pump alarms go off prior to the pump reading "Low Volume" then call [phone] and someone can assist you.  If the plunger comes out and the chemotherapy medication is leaking out, please use your home chemo spill kit to clean up the spill. Do NOT use paper towels or other household products.  If you have problems or questions regarding your pump, please call either [phone] (24 hours a day) or the cancer center Monday-Friday 8:00 a.m.- 4:30 p.m. at the clinic number and we will assist you. If you are unable to get assistance, then go to the nearest Emergency Department and ask the staff to contact the IV team for assistance.  Discharge Instructions: Thank you for choosing [facility] to provide your oncology and hematology care.   If you have a lab appointment with the [facility], please go directly to the [facility] and check in at the registration area.   Wear comfortable clothing and clothing appropriate for easy access to any Portacath or PICC line.   We strive to give you quality time with your provider. You may need to reschedule your appointment if you arrive late (15 or more minutes).  Arriving late affects you and other patients whose appointments are after yours.  Also, if you miss three or more appointments without notifying the office, you may be dismissed from the clinic at the provider's discretion.      For prescription  refill requests, have your pharmacy contact our office and allow 72 hours for refills to be completed.    Today you received the following chemotherapy and/or immunotherapy agents Oxaliplatin, Leucovorin, Fluorouracil.      To help prevent nausea and vomiting after your treatment, we encourage you to take your nausea medication as directed.  BELOW ARE SYMPTOMS THAT SHOULD BE REPORTED IMMEDIATELY: *FEVER GREATER THAN 100.4 F (38 C) OR HIGHER *CHILLS OR SWEATING *NAUSEA AND VOMITING THAT IS NOT CONTROLLED WITH YOUR NAUSEA MEDICATION *UNUSUAL SHORTNESS OF BREATH *UNUSUAL BRUISING OR BLEEDING *URINARY PROBLEMS (pain or burning when urinating, or frequent urination) *BOWEL PROBLEMS (unusual diarrhea, constipation, pain near the anus) TENDERNESS IN MOUTH AND THROAT WITH OR WITHOUT PRESENCE OF ULCERS (sore throat, sores in mouth, or a toothache) UNUSUAL RASH, SWELLING OR PAIN  UNUSUAL VAGINAL DISCHARGE OR ITCHING   Items with * indicate a potential emergency and should be followed up as soon as possible or go to the Emergency Department if any problems should occur.  Please show the CHEMOTHERAPY ALERT CARD or IMMUNOTHERAPY ALERT CARD at check-in to the Emergency Department and triage nurse.  Should you have questions after your visit or need to cancel or reschedule your appointment, please contact [facility]  Dept: [phone]  and follow the prompts.  Office hours are 8:00 a.m. to 4:30 p.m. Monday - Friday. Please note that voicemails  left after 4:00 p.m. may not be returned until the following business day.  We are closed weekends and major holidays. You have access to a nurse at all times for urgent questions. Please call the main number to the clinic Dept: [phone] and follow the prompts.   For any non-urgent questions, you may also contact your provider using MyChart. We now offer e-Visits for anyone 36 and older to request care online for non-urgent  symptoms. For details visit [email or web address].   Also download the MyChart app! Go to the app store, search "MyChart", open the app, select [facility], and log in with your MyChart username and password.  Due to Covid, a mask is required upon entering the hospital/clinic. If you do not have a mask, one will be given to you upon arrival. For doctor visits, patients may have 1 support person aged 2 or older with them. For treatment visits, patients cannot have anyone with them due to current Covid guidelines and our immunocompromised population.   Oxaliplatin Injection What is this medication? OXALIPLATIN (ox AL i PLA tin) is a chemotherapy drug. It targets fast dividing cells, like cancer cells, and causes these cells to die. This medicine is used to treat cancers of the colon and rectum, and many other cancers. This medicine may be used for other purposes; ask your health care provider or pharmacist if you have questions. COMMON BRAND NAME(S): Eloxatin What should I tell my care team before I take this medication? They need to know if you have any of these conditions: heart disease history of irregular heartbeat liver disease low blood counts, like white cells, platelets, or red blood cells lung or breathing disease, like asthma take medicines that treat or prevent blood clots tingling of the fingers or toes, or other nerve disorder an unusual or allergic reaction to oxaliplatin, other chemotherapy, other medicines, foods, dyes, or preservatives pregnant or trying to get pregnant breast-feeding How should I use this medication? This drug is given as an infusion into a vein. It is administered in a hospital or clinic by a specially trained health care professional. Talk to your pediatrician regarding the use of this medicine in children. Special care may be needed. Overdosage: If you think you have taken too much of this medicine contact a poison control center or emergency room at  once. NOTE: This medicine is only for you. Do not share this medicine with others. What if I miss a dose? It is important not to miss a dose. Call your doctor or health care professional if you are unable to keep an appointment. What may interact with this medication? Do not take this medicine with any of the following medications: cisapride dronedarone pimozide thioridazine This medicine may also interact with the following medications: aspirin and aspirin-like medicines certain medicines that treat or prevent blood clots like warfarin, apixaban, dabigatran, and rivaroxaban cisplatin cyclosporine diuretics medicines for infection like acyclovir, adefovir, amphotericin B, bacitracin, cidofovir, foscarnet, ganciclovir, gentamicin, pentamidine, vancomycin NSAIDs, medicines for pain and inflammation, like ibuprofen or naproxen other medicines that prolong the QT interval (an abnormal heart rhythm) pamidronate zoledronic acid This list may not describe all possible interactions. Give your health care provider a list of all the medicines, herbs, non-prescription drugs, or dietary supplements you use. Also tell them if you smoke, drink alcohol, or use illegal drugs. Some items may interact with your medicine. What should I watch for while using this medication? Your condition will be monitored carefully while you are receiving this  medicine. You may need blood work done while you are taking this medicine. This medicine may make you feel generally unwell. This is not uncommon as chemotherapy can affect healthy cells as well as cancer cells. Report any side effects. Continue your course of treatment even though you feel ill unless your healthcare professional tells you to stop. This medicine can make you more sensitive to cold. Do not drink cold drinks or use ice. Cover exposed skin before coming in contact with cold temperatures or cold objects. When out in cold weather wear warm clothing and  cover your mouth and nose to warm the air that goes into your lungs. Tell your doctor if you get sensitive to the cold. Do not become pregnant while taking this medicine or for 9 months after stopping it. Women should inform their health care professional if they wish to become pregnant or think they might be pregnant. Men should not father a child while taking this medicine and for 6 months after stopping it. There is potential for serious side effects to an unborn child. Talk to your health care professional for more information. Do not breast-feed a child while taking this medicine or for 3 months after stopping it. This medicine has caused ovarian failure in some women. This medicine may make it more difficult to get pregnant. Talk to your health care professional if you are concerned about your fertility. This medicine has caused decreased sperm counts in some men. This may make it more difficult to father a child. Talk to your health care professional if you are concerned about your fertility. This medicine may increase your risk of getting an infection. Call your health care professional for advice if you get a fever, chills, or sore throat, or other symptoms of a cold or flu. Do not treat yourself. Try to avoid being around people who are sick. Avoid taking medicines that contain aspirin, acetaminophen, ibuprofen, naproxen, or ketoprofen unless instructed by your health care professional. These medicines may hide a fever. Be careful brushing or flossing your teeth or using a toothpick because you may get an infection or bleed more easily. If you have any dental work done, tell your dentist you are receiving this medicine. What side effects may I notice from receiving this medication? Side effects that you should report to your doctor or health care professional as soon as possible: allergic reactions like skin rash, itching or hives, swelling of the face, lips, or tongue breathing  problems cough low blood counts - this medicine may decrease the number of white blood cells, red blood cells, and platelets. You may be at increased risk for infections and bleeding nausea, vomiting pain, redness, or irritation at site where injected pain, tingling, numbness in the hands or feet signs and symptoms of bleeding such as bloody or black, tarry stools; red or dark brown urine; spitting up blood or brown material that looks like coffee grounds; red spots on the skin; unusual bruising or bleeding from the eyes, gums, or nose signs and symptoms of a dangerous change in heartbeat or heart rhythm like chest pain; dizziness; fast, irregular heartbeat; palpitations; feeling faint or lightheaded; falls signs and symptoms of infection like fever; chills; cough; sore throat; pain or trouble passing urine signs and symptoms of liver injury like dark yellow or brown urine; general ill feeling or flu-like symptoms; light-colored stools; loss of appetite; nausea; right upper belly pain; unusually weak or tired; yellowing of the eyes or skin signs and symptoms of  low red blood cells or anemia such as unusually weak or tired; feeling faint or lightheaded; falls signs and symptoms of muscle injury like dark urine; trouble passing urine or change in the amount of urine; unusually weak or tired; muscle pain; back pain Side effects that usually do not require medical attention (report to your doctor or health care professional if they continue or are bothersome): changes in taste diarrhea gas hair loss loss of appetite mouth sores This list may not describe all possible side effects. Call your doctor for medical advice about side effects. You may report side effects to FDA at 1-800-FDA-1088. Where should I keep my medication? This drug is given in a hospital or clinic and will not be stored at home. NOTE: This sheet is a summary. It may not cover all possible information. If you have questions about  this medicine, talk to your doctor, pharmacist, or health care provider.  2023 Elsevier/Gold Standard (2021-05-13 00:00:00)  Leucovorin injection What is this medication? LEUCOVORIN (loo koe VOR in) is used to prevent or treat the harmful effects of some medicines. This medicine is used to treat anemia caused by a low amount of folic acid in the body. It is also used with 5-fluorouracil (5-FU) to treat colon cancer. This medicine may be used for other purposes; ask your health care provider or pharmacist if you have questions. What should I tell my care team before I take this medication? They need to know if you have any of these conditions: anemia from low levels of vitamin B-12 in the blood an unusual or allergic reaction to leucovorin, folic acid, other medicines, foods, dyes, or preservatives pregnant or trying to get pregnant breast-feeding How should I use this medication? This medicine is for injection into a muscle or into a vein. It is given by a health care professional in a hospital or clinic setting. Talk to your pediatrician regarding the use of this medicine in children. Special care may be needed. Overdosage: If you think you have taken too much of this medicine contact a poison control center or emergency room at once. NOTE: This medicine is only for you. Do not share this medicine with others. What if I miss a dose? This does not apply. What may interact with this medication? capecitabine fluorouracil phenobarbital phenytoin primidone trimethoprim-sulfamethoxazole This list may not describe all possible interactions. Give your health care provider a list of all the medicines, herbs, non-prescription drugs, or dietary supplements you use. Also tell them if you smoke, drink alcohol, or use illegal drugs. Some items may interact with your medicine. What should I watch for while using this medication? Your condition will be monitored carefully while you are receiving this  medicine. This medicine may increase the side effects of 5-fluorouracil, 5-FU. Tell your doctor or health care professional if you have diarrhea or mouth sores that do not get better or that get worse. What side effects may I notice from receiving this medication? Side effects that you should report to your doctor or health care professional as soon as possible: allergic reactions like skin rash, itching or hives, swelling of the face, lips, or tongue breathing problems fever, infection mouth sores unusual bleeding or bruising unusually weak or tired Side effects that usually do not require medical attention (report to your doctor or health care professional if they continue or are bothersome): constipation or diarrhea loss of appetite nausea, vomiting This list may not describe all possible side effects. Call your doctor for medical  advice about side effects. You may report side effects to FDA at 1-800-FDA-1088. Where should I keep my medication? This drug is given in a hospital or clinic and will not be stored at home. NOTE: This sheet is a summary. It may not cover all possible information. If you have questions about this medicine, talk to your doctor, pharmacist, or health care provider.  2023 Elsevier/Gold Standard (2007-12-19 00:00:00)  Fluorouracil, 5-FU injection What is this medication? FLUOROURACIL, 5-FU (flure oh YOOR a sil) is a chemotherapy drug. It slows the growth of cancer cells. This medicine is used to treat many types of cancer like breast cancer, colon or rectal cancer, pancreatic cancer, and stomach cancer. This medicine may be used for other purposes; ask your health care provider or pharmacist if you have questions. COMMON BRAND NAME(S): Adrucil What should I tell my care team before I take this medication? They need to know if you have any of these conditions: blood disorders dihydropyrimidine dehydrogenase (DPD) deficiency infection (especially a virus  infection such as chickenpox, cold sores, or herpes) kidney disease liver disease malnourished, poor nutrition recent or ongoing radiation therapy an unusual or allergic reaction to fluorouracil, other chemotherapy, other medicines, foods, dyes, or preservatives pregnant or trying to get pregnant breast-feeding How should I use this medication? This drug is given as an infusion or injection into a vein. It is administered in a hospital or clinic by a specially trained health care professional. Talk to your pediatrician regarding the use of this medicine in children. Special care may be needed. Overdosage: If you think you have taken too much of this medicine contact a poison control center or emergency room at once. NOTE: This medicine is only for you. Do not share this medicine with others. What if I miss a dose? It is important not to miss your dose. Call your doctor or health care professional if you are unable to keep an appointment. What may interact with this medication? Do not take this medicine with any of the following medications: live virus vaccines This medicine may also interact with the following medications: medicines that treat or prevent blood clots like warfarin, enoxaparin, and dalteparin This list may not describe all possible interactions. Give your health care provider a list of all the medicines, herbs, non-prescription drugs, or dietary supplements you use. Also tell them if you smoke, drink alcohol, or use illegal drugs. Some items may interact with your medicine. What should I watch for while using this medication? Visit your doctor for checks on your progress. This drug may make you feel generally unwell. This is not uncommon, as chemotherapy can affect healthy cells as well as cancer cells. Report any side effects. Continue your course of treatment even though you feel ill unless your doctor tells you to stop. In some cases, you may be given additional medicines to  help with side effects. Follow all directions for their use. Call your doctor or health care professional for advice if you get a fever, chills or sore throat, or other symptoms of a cold or flu. Do not treat yourself. This drug decreases your body's ability to fight infections. Try to avoid being around people who are sick. This medicine may increase your risk to bruise or bleed. Call your doctor or health care professional if you notice any unusual bleeding. Be careful brushing and flossing your teeth or using a toothpick because you may get an infection or bleed more easily. If you have any dental work done,  tell your dentist you are receiving this medicine. Avoid taking products that contain aspirin, acetaminophen, ibuprofen, naproxen, or ketoprofen unless instructed by your doctor. These medicines may hide a fever. Do not become pregnant while taking this medicine. Women should inform their doctor if they wish to become pregnant or think they might be pregnant. There is a potential for serious side effects to an unborn child. Talk to your health care professional or pharmacist for more information. Do not breast-feed an infant while taking this medicine. Men should inform their doctor if they wish to father a child. This medicine may lower sperm counts. Do not treat diarrhea with over the counter products. Contact your doctor if you have diarrhea that lasts more than 2 days or if it is severe and watery. This medicine can make you more sensitive to the sun. Keep out of the sun. If you cannot avoid being in the sun, wear protective clothing and use sunscreen. Do not use sun lamps or tanning beds/booths. What side effects may I notice from receiving this medication? Side effects that you should report to your doctor or health care professional as soon as possible: allergic reactions like skin rash, itching or hives, swelling of the face, lips, or tongue low blood counts - this medicine may decrease  the number of white blood cells, red blood cells and platelets. You may be at increased risk for infections and bleeding. signs of infection - fever or chills, cough, sore throat, pain or difficulty passing urine signs of decreased platelets or bleeding - bruising, pinpoint red spots on the skin, black, tarry stools, blood in the urine signs of decreased red blood cells - unusually weak or tired, fainting spells, lightheadedness breathing problems changes in vision chest pain mouth sores nausea and vomiting pain, swelling, redness at site where injected pain, tingling, numbness in the hands or feet redness, swelling, or sores on hands or feet stomach pain unusual bleeding Side effects that usually do not require medical attention (report to your doctor or health care professional if they continue or are bothersome): changes in finger or toe nails diarrhea dry or itchy skin hair loss headache loss of appetite sensitivity of eyes to the light stomach upset unusually teary eyes This list may not describe all possible side effects. Call your doctor for medical advice about side effects. You may report side effects to FDA at 1-800-FDA-1088. Where should I keep my medication? This drug is given in a hospital or clinic and will not be stored at home. NOTE: This sheet is a summary. It may not cover all possible information. If you have questions about this medicine, talk to your doctor, pharmacist, or health care provider.  2023 Elsevier/Gold Standard (2021-05-13 00:00:00)

## 2021-12-14 NOTE — Patient Instructions (Signed)

## 2021-12-14 NOTE — Progress Notes (Signed)
Patient seen by Ned Card NP today  Vitals are within treatment parameters.  Labs reviewed by Ned Card NP and are within treatment parameters.  Per physician team, patient is ready for treatment. Please note that modifications are being made to the treatment plan including Addition   of Emend for significant delayed nausea and dose reduction of 5FU.
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
## 2021-12-16 ENCOUNTER — Inpatient Hospital Stay: Payer: Commercial Managed Care - PPO

## 2021-12-16 VITALS — BP 118/87 | HR 85 | Temp 98.2°F | Resp 18

## 2021-12-16 DIAGNOSIS — Z5111 Encounter for antineoplastic chemotherapy: Secondary | ICD-10-CM | POA: Diagnosis not present

## 2021-12-16 DIAGNOSIS — C20 Malignant neoplasm of rectum: Secondary | ICD-10-CM

## 2021-12-16 MED ORDER — HEPARIN SOD (PORK) LOCK FLUSH 100 UNIT/ML IV SOLN
500.0000 [IU] | Freq: Once | INTRAVENOUS | Status: AC | PRN
  Administered 2021-12-16: 500 [IU]

## 2021-12-16 MED ORDER — SODIUM CHLORIDE 0.9% FLUSH
10.0000 mL | INTRAVENOUS | Status: DC | PRN
  Administered 2021-12-16: 10 mL

## 2021-12-24 ENCOUNTER — Other Ambulatory Visit: Payer: Self-pay | Admitting: Oncology

## 2021-12-28 ENCOUNTER — Inpatient Hospital Stay: Payer: Commercial Managed Care - PPO

## 2021-12-28 ENCOUNTER — Encounter: Payer: Self-pay | Admitting: Nurse Practitioner

## 2021-12-28 ENCOUNTER — Inpatient Hospital Stay (HOSPITAL_BASED_OUTPATIENT_CLINIC_OR_DEPARTMENT_OTHER): Payer: Commercial Managed Care - PPO | Admitting: Nurse Practitioner

## 2021-12-28 ENCOUNTER — Encounter: Payer: Self-pay | Admitting: *Deleted

## 2021-12-28 ENCOUNTER — Inpatient Hospital Stay: Payer: Commercial Managed Care - PPO | Attending: Oncology

## 2021-12-28 VITALS — BP 136/87 | HR 90 | Temp 97.9°F | Resp 18 | Ht 72.0 in | Wt 262.0 lb

## 2021-12-28 DIAGNOSIS — C20 Malignant neoplasm of rectum: Secondary | ICD-10-CM

## 2021-12-28 DIAGNOSIS — C19 Malignant neoplasm of rectosigmoid junction: Secondary | ICD-10-CM | POA: Insufficient documentation

## 2021-12-28 DIAGNOSIS — M549 Dorsalgia, unspecified: Secondary | ICD-10-CM | POA: Diagnosis not present

## 2021-12-28 DIAGNOSIS — Z803 Family history of malignant neoplasm of breast: Secondary | ICD-10-CM | POA: Insufficient documentation

## 2021-12-28 DIAGNOSIS — R5381 Other malaise: Secondary | ICD-10-CM | POA: Diagnosis not present

## 2021-12-28 DIAGNOSIS — K5900 Constipation, unspecified: Secondary | ICD-10-CM | POA: Insufficient documentation

## 2021-12-28 DIAGNOSIS — Z5111 Encounter for antineoplastic chemotherapy: Secondary | ICD-10-CM | POA: Insufficient documentation

## 2021-12-28 LAB — CBC WITH DIFFERENTIAL (CANCER CENTER ONLY)
Abs Immature Granulocytes: 0 10*3/uL (ref 0.00–0.07)
Basophils Absolute: 0.1 10*3/uL (ref 0.0–0.1)
Basophils Relative: 2 %
Eosinophils Absolute: 0.4 10*3/uL (ref 0.0–0.5)
Eosinophils Relative: 7 %
HCT: 46.8 % (ref 39.0–52.0)
Hemoglobin: 15.9 g/dL (ref 13.0–17.0)
Immature Granulocytes: 0 %
Lymphocytes Relative: 22 %
Lymphs Abs: 1.1 10*3/uL (ref 0.7–4.0)
MCH: 29.1 pg (ref 26.0–34.0)
MCHC: 34 g/dL (ref 30.0–36.0)
MCV: 85.6 fL (ref 80.0–100.0)
Monocytes Absolute: 0.4 10*3/uL (ref 0.1–1.0)
Monocytes Relative: 9 %
Neutro Abs: 3.1 10*3/uL (ref 1.7–7.7)
Neutrophils Relative %: 60 %
Platelet Count: 136 10*3/uL — ABNORMAL LOW (ref 150–400)
RBC: 5.47 MIL/uL (ref 4.22–5.81)
RDW: 14.4 % (ref 11.5–15.5)
WBC Count: 5.2 10*3/uL (ref 4.0–10.5)
nRBC: 0 % (ref 0.0–0.2)

## 2021-12-28 LAB — CMP (CANCER CENTER ONLY)
ALT: 32 U/L (ref 0–44)
AST: 20 U/L (ref 15–41)
Albumin: 4.4 g/dL (ref 3.5–5.0)
Alkaline Phosphatase: 72 U/L (ref 38–126)
Anion gap: 11 (ref 5–15)
BUN: 18 mg/dL (ref 8–23)
CO2: 22 mmol/L (ref 22–32)
Calcium: 9.5 mg/dL (ref 8.9–10.3)
Chloride: 103 mmol/L (ref 98–111)
Creatinine: 1.05 mg/dL (ref 0.61–1.24)
GFR, Estimated: >60 mL/min (ref >60)
Glucose, Bld: 210 mg/dL — ABNORMAL HIGH (ref 70–99)
Potassium: 4.3 mmol/L (ref 3.5–5.1)
Sodium: 136 mmol/L (ref 135–145)
Total Bilirubin: 0.6 mg/dL (ref 0.3–1.2)
Total Protein: 7 g/dL (ref 6.5–8.1)

## 2021-12-28 LAB — CEA (ACCESS): CEA (CHCC): 8.76 ng/mL — ABNORMAL HIGH (ref 0.00–5.00)

## 2021-12-28 MED ORDER — OXALIPLATIN CHEMO INJECTION 100 MG/20ML
80.0000 mg/m2 | Freq: Once | INTRAVENOUS | Status: AC
  Administered 2021-12-28: 200 mg via INTRAVENOUS
  Filled 2021-12-28: qty 40

## 2021-12-28 MED ORDER — PALONOSETRON HCL INJECTION 0.25 MG/5ML
0.2500 mg | Freq: Once | INTRAVENOUS | Status: AC
  Administered 2021-12-28: 0.25 mg via INTRAVENOUS
  Filled 2021-12-28: qty 5

## 2021-12-28 MED ORDER — ONDANSETRON HCL 8 MG PO TABS: 8.0000 mg | ORAL_TABLET | Freq: Three times a day (TID) | ORAL | 3 refills | Status: DC | PRN

## 2021-12-28 MED ORDER — PROCHLORPERAZINE MALEATE 10 MG PO TABS: 10.0000 mg | ORAL_TABLET | Freq: Four times a day (QID) | ORAL | 3 refills | Status: DC | PRN

## 2021-12-28 MED ORDER — DEXTROSE 5 % IV SOLN: Freq: Once | INTRAVENOUS | Status: AC

## 2021-12-28 MED ORDER — LEUCOVORIN CALCIUM INJECTION 350 MG
300.0000 mg/m2 | Freq: Once | INTRAVENOUS | Status: AC
  Administered 2021-12-28: 742 mg via INTRAVENOUS
  Filled 2021-12-28: qty 37.1

## 2021-12-28 MED ORDER — FLUOROURACIL CHEMO INJECTION 2.5 GM/50ML
300.0000 mg/m2 | Freq: Once | INTRAVENOUS | Status: AC
  Administered 2021-12-28: 750 mg via INTRAVENOUS
  Filled 2021-12-28: qty 15

## 2021-12-28 MED ORDER — SODIUM CHLORIDE 0.9 % IV SOLN
1920.0000 mg/m2 | INTRAVENOUS | Status: DC
  Administered 2021-12-28: 4750 mg via INTRAVENOUS
  Filled 2021-12-28: qty 95

## 2021-12-28 MED ORDER — SODIUM CHLORIDE 0.9 % IV SOLN
10.0000 mg | Freq: Once | INTRAVENOUS | Status: AC
  Administered 2021-12-28: 10 mg via INTRAVENOUS
  Filled 2021-12-28: qty 10

## 2021-12-28 MED ORDER — SODIUM CHLORIDE 0.9 % IV SOLN
150.0000 mg | Freq: Once | INTRAVENOUS | Status: AC
  Administered 2021-12-28: 150 mg via INTRAVENOUS
  Filled 2021-12-28: qty 150

## 2021-12-28 NOTE — Progress Notes (Signed)
Patient seen by Lisa Thomas NP today  Vitals are within treatment parameters.  Labs reviewed by Lisa Thomas NP and are within treatment parameters.  Per physician team, patient is ready for treatment and there are NO modifications to the treatment plan.     

## 2021-12-28 NOTE — Patient Instructions (Signed)
[facility]   Discharge Instructions: Thank you for choosing [facility] to provide your oncology and hematology care.   If you have a lab appointment with the [facility], please go directly to the [facility] and check in at the registration area.   Wear comfortable clothing and clothing appropriate for easy access to any Portacath or PICC line.   We strive to give you quality time with your provider. You may need to reschedule your appointment if you arrive late (15 or more minutes).  Arriving late affects you and other patients whose appointments are after yours.  Also, if you miss three or more appointments without notifying the office, you may be dismissed from the clinic at the provider's discretion.      For prescription refill requests, have your pharmacy contact our office and allow 72 hours for refills to be completed.    Today you received the following chemotherapy and/or immunotherapy agents Oxaliplatin (ELOXATIN), Leucovorin & Flourouracil (ADRUCIL).      To help prevent nausea and vomiting after your treatment, we encourage you to take your nausea medication as directed.  BELOW ARE SYMPTOMS THAT SHOULD BE REPORTED IMMEDIATELY: *FEVER GREATER THAN 100.4 F (38 C) OR HIGHER *CHILLS OR SWEATING *NAUSEA AND VOMITING THAT IS NOT CONTROLLED WITH YOUR NAUSEA MEDICATION *UNUSUAL SHORTNESS OF BREATH *UNUSUAL BRUISING OR BLEEDING *URINARY PROBLEMS (pain or burning when urinating, or frequent urination) *BOWEL PROBLEMS (unusual diarrhea, constipation, pain near the anus) TENDERNESS IN MOUTH AND THROAT WITH OR WITHOUT PRESENCE OF ULCERS (sore throat, sores in mouth, or a toothache) UNUSUAL RASH, SWELLING OR PAIN  UNUSUAL VAGINAL DISCHARGE OR ITCHING   Items with * indicate a potential emergency and should be followed up as soon as possible or go to the Emergency Department if any problems should occur.  Please show the CHEMOTHERAPY ALERT  CARD or IMMUNOTHERAPY ALERT CARD at check-in to the Emergency Department and triage nurse.  Should you have questions after your visit or need to cancel or reschedule your appointment, please contact [facility]  Dept: [phone]  and follow the prompts.  Office hours are 8:00 a.m. to 4:30 p.m. Monday - Friday. Please note that voicemails left after 4:00 p.m. may not be returned until the following business day.  We are closed weekends and major holidays. You have access to a nurse at all times for urgent questions. Please call the main number to the clinic Dept: [phone] and follow the prompts.   For any non-urgent questions, you may also contact your provider using MyChart. We now offer e-Visits for anyone 77 and older to request care online for non-urgent symptoms. For details visit [email or web address].   Also download the MyChart app! Go to the app store, search "MyChart", open the app, select [facility], and log in with your MyChart username and password.  Masks are optional in the cancer centers. If you would like for your care team to wear a mask while they are taking care of you, please let them know. For doctor visits, patients may have with them one support person who is at least 62 years old. At this time, visitors are not allowed in the infusion area.  Oxaliplatin Injection What is this medication? OXALIPLATIN (ox AL i PLA tin) is a chemotherapy drug. It targets fast dividing cells, like cancer cells, and causes these cells to die. This medicine is used to treat cancers of the colon and rectum, and many  other cancers. This medicine may be used for other purposes; ask your health care provider or pharmacist if you have questions. COMMON BRAND NAME(S): Eloxatin What should I tell my care team before I take this medication? They need to know if you have any of these conditions: heart disease history of irregular heartbeat liver disease low blood  counts, like white cells, platelets, or red blood cells lung or breathing disease, like asthma take medicines that treat or prevent blood clots tingling of the fingers or toes, or other nerve disorder an unusual or allergic reaction to oxaliplatin, other chemotherapy, other medicines, foods, dyes, or preservatives pregnant or trying to get pregnant breast-feeding How should I use this medication? This drug is given as an infusion into a vein. It is administered in a hospital or clinic by a specially trained health care professional. Talk to your pediatrician regarding the use of this medicine in children. Special care may be needed. Overdosage: If you think you have taken too much of this medicine contact a poison control center or emergency room at once. NOTE: This medicine is only for you. Do not share this medicine with others. What if I miss a dose? It is important not to miss a dose. Call your doctor or health care professional if you are unable to keep an appointment. What may interact with this medication? Do not take this medicine with any of the following medications: cisapride dronedarone pimozide thioridazine This medicine may also interact with the following medications: aspirin and aspirin-like medicines certain medicines that treat or prevent blood clots like warfarin, apixaban, dabigatran, and rivaroxaban cisplatin cyclosporine diuretics medicines for infection like acyclovir, adefovir, amphotericin B, bacitracin, cidofovir, foscarnet, ganciclovir, gentamicin, pentamidine, vancomycin NSAIDs, medicines for pain and inflammation, like ibuprofen or naproxen other medicines that prolong the QT interval (an abnormal heart rhythm) pamidronate zoledronic acid This list may not describe all possible interactions. Give your health care provider a list of all the medicines, herbs, non-prescription drugs, or dietary supplements you use. Also tell them if you smoke, drink alcohol,  or use illegal drugs. Some items may interact with your medicine. What should I watch for while using this medication? Your condition will be monitored carefully while you are receiving this medicine. You may need blood work done while you are taking this medicine. This medicine may make you feel generally unwell. This is not uncommon as chemotherapy can affect healthy cells as well as cancer cells. Report any side effects. Continue your course of treatment even though you feel ill unless your healthcare professional tells you to stop. This medicine can make you more sensitive to cold. Do not drink cold drinks or use ice. Cover exposed skin before coming in contact with cold temperatures or cold objects. When out in cold weather wear warm clothing and cover your mouth and nose to warm the air that goes into your lungs. Tell your doctor if you get sensitive to the cold. Do not become pregnant while taking this medicine or for 9 months after stopping it. Women should inform their health care professional if they wish to become pregnant or think they might be pregnant. Men should not father a child while taking this medicine and for 6 months after stopping it. There is potential for serious side effects to an unborn child. Talk to your health care professional for more information. Do not breast-feed a child while taking this medicine or for 3 months after stopping it. This medicine has caused ovarian failure in some  women. This medicine may make it more difficult to get pregnant. Talk to your health care professional if you are concerned about your fertility. This medicine has caused decreased sperm counts in some men. This may make it more difficult to father a child. Talk to your health care professional if you are concerned about your fertility. This medicine may increase your risk of getting an infection. Call your health care professional for advice if you get a fever, chills, or sore throat, or other  symptoms of a cold or flu. Do not treat yourself. Try to avoid being around people who are sick. Avoid taking medicines that contain aspirin, acetaminophen, ibuprofen, naproxen, or ketoprofen unless instructed by your health care professional. These medicines may hide a fever. Be careful brushing or flossing your teeth or using a toothpick because you may get an infection or bleed more easily. If you have any dental work done, tell your dentist you are receiving this medicine. What side effects may I notice from receiving this medication? Side effects that you should report to your doctor or health care professional as soon as possible: allergic reactions like skin rash, itching or hives, swelling of the face, lips, or tongue breathing problems cough low blood counts - this medicine may decrease the number of white blood cells, red blood cells, and platelets. You may be at increased risk for infections and bleeding nausea, vomiting pain, redness, or irritation at site where injected pain, tingling, numbness in the hands or feet signs and symptoms of bleeding such as bloody or black, tarry stools; red or dark brown urine; spitting up blood or brown material that looks like coffee grounds; red spots on the skin; unusual bruising or bleeding from the eyes, gums, or nose signs and symptoms of a dangerous change in heartbeat or heart rhythm like chest pain; dizziness; fast, irregular heartbeat; palpitations; feeling faint or lightheaded; falls signs and symptoms of infection like fever; chills; cough; sore throat; pain or trouble passing urine signs and symptoms of liver injury like dark yellow or brown urine; general ill feeling or flu-like symptoms; light-colored stools; loss of appetite; nausea; right upper belly pain; unusually weak or tired; yellowing of the eyes or skin signs and symptoms of low red blood cells or anemia such as unusually weak or tired; feeling faint or lightheaded; falls signs and  symptoms of muscle injury like dark urine; trouble passing urine or change in the amount of urine; unusually weak or tired; muscle pain; back pain Side effects that usually do not require medical attention (report to your doctor or health care professional if they continue or are bothersome): changes in taste diarrhea gas hair loss loss of appetite mouth sores This list may not describe all possible side effects. Call your doctor for medical advice about side effects. You may report side effects to FDA at 1-800-FDA-1088. Where should I keep my medication? This drug is given in a hospital or clinic and will not be stored at home. NOTE: This sheet is a summary. It may not cover all possible information. If you have questions about this medicine, talk to your doctor, pharmacist, or health care provider.  2023 Elsevier/Gold Standard (2021-05-13 00:00:00) Leucovorin injection What is this medication? LEUCOVORIN (loo koe VOR in) is used to prevent or treat the harmful effects of some medicines. This medicine is used to treat anemia caused by a low amount of folic acid in the body. It is also used with 5-fluorouracil (5-FU) to treat colon cancer. This  medicine may be used for other purposes; ask your health care provider or pharmacist if you have questions. What should I tell my care team before I take this medication? They need to know if you have any of these conditions: anemia from low levels of vitamin B-12 in the blood an unusual or allergic reaction to leucovorin, folic acid, other medicines, foods, dyes, or preservatives pregnant or trying to get pregnant breast-feeding How should I use this medication? This medicine is for injection into a muscle or into a vein. It is given by a health care professional in a hospital or clinic setting. Talk to your pediatrician regarding the use of this medicine in children. Special care may be needed. Overdosage: If you think you have taken too much of  this medicine contact a poison control center or emergency room at once. NOTE: This medicine is only for you. Do not share this medicine with others. What if I miss a dose? This does not apply. What may interact with this medication? capecitabine fluorouracil phenobarbital phenytoin primidone trimethoprim-sulfamethoxazole This list may not describe all possible interactions. Give your health care provider a list of all the medicines, herbs, non-prescription drugs, or dietary supplements you use. Also tell them if you smoke, drink alcohol, or use illegal drugs. Some items may interact with your medicine. What should I watch for while using this medication? Your condition will be monitored carefully while you are receiving this medicine. This medicine may increase the side effects of 5-fluorouracil, 5-FU. Tell your doctor or health care professional if you have diarrhea or mouth sores that do not get better or that get worse. What side effects may I notice from receiving this medication? Side effects that you should report to your doctor or health care professional as soon as possible: allergic reactions like skin rash, itching or hives, swelling of the face, lips, or tongue breathing problems fever, infection mouth sores unusual bleeding or bruising unusually weak or tired Side effects that usually do not require medical attention (report to your doctor or health care professional if they continue or are bothersome): constipation or diarrhea loss of appetite nausea, vomiting This list may not describe all possible side effects. Call your doctor for medical advice about side effects. You may report side effects to FDA at 1-800-FDA-1088. Where should I keep my medication? This drug is given in a hospital or clinic and will not be stored at home. NOTE: This sheet is a summary. It may not cover all possible information. If you have questions about this medicine, talk to your doctor,  pharmacist, or health care provider.  2023 Elsevier/Gold Standard (2007-12-19 00:00:00) Fluorouracil, 5-FU injection What is this medication? FLUOROURACIL, 5-FU (flure oh YOOR a sil) is a chemotherapy drug. It slows the growth of cancer cells. This medicine is used to treat many types of cancer like breast cancer, colon or rectal cancer, pancreatic cancer, and stomach cancer. This medicine may be used for other purposes; ask your health care provider or pharmacist if you have questions. COMMON BRAND NAME(S): Adrucil What should I tell my care team before I take this medication? They need to know if you have any of these conditions: blood disorders dihydropyrimidine dehydrogenase (DPD) deficiency infection (especially a virus infection such as chickenpox, cold sores, or herpes) kidney disease liver disease malnourished, poor nutrition recent or ongoing radiation therapy an unusual or allergic reaction to fluorouracil, other chemotherapy, other medicines, foods, dyes, or preservatives pregnant or trying to get pregnant breast-feeding How should  I use this medication? This drug is given as an infusion or injection into a vein. It is administered in a hospital or clinic by a specially trained health care professional. Talk to your pediatrician regarding the use of this medicine in children. Special care may be needed. Overdosage: If you think you have taken too much of this medicine contact a poison control center or emergency room at once. NOTE: This medicine is only for you. Do not share this medicine with others. What if I miss a dose? It is important not to miss your dose. Call your doctor or health care professional if you are unable to keep an appointment. What may interact with this medication? Do not take this medicine with any of the following medications: live virus vaccines This medicine may also interact with the following medications: medicines that treat or prevent blood  clots like warfarin, enoxaparin, and dalteparin This list may not describe all possible interactions. Give your health care provider a list of all the medicines, herbs, non-prescription drugs, or dietary supplements you use. Also tell them if you smoke, drink alcohol, or use illegal drugs. Some items may interact with your medicine. What should I watch for while using this medication? Visit your doctor for checks on your progress. This drug may make you feel generally unwell. This is not uncommon, as chemotherapy can affect healthy cells as well as cancer cells. Report any side effects. Continue your course of treatment even though you feel ill unless your doctor tells you to stop. In some cases, you may be given additional medicines to help with side effects. Follow all directions for their use. Call your doctor or health care professional for advice if you get a fever, chills or sore throat, or other symptoms of a cold or flu. Do not treat yourself. This drug decreases your body's ability to fight infections. Try to avoid being around people who are sick. This medicine may increase your risk to bruise or bleed. Call your doctor or health care professional if you notice any unusual bleeding. Be careful brushing and flossing your teeth or using a toothpick because you may get an infection or bleed more easily. If you have any dental work done, tell your dentist you are receiving this medicine. Avoid taking products that contain aspirin, acetaminophen, ibuprofen, naproxen, or ketoprofen unless instructed by your doctor. These medicines may hide a fever. Do not become pregnant while taking this medicine. Women should inform their doctor if they wish to become pregnant or think they might be pregnant. There is a potential for serious side effects to an unborn child. Talk to your health care professional or pharmacist for more information. Do not breast-feed an infant while taking this medicine. Men should  inform their doctor if they wish to father a child. This medicine may lower sperm counts. Do not treat diarrhea with over the counter products. Contact your doctor if you have diarrhea that lasts more than 2 days or if it is severe and watery. This medicine can make you more sensitive to the sun. Keep out of the sun. If you cannot avoid being in the sun, wear protective clothing and use sunscreen. Do not use sun lamps or tanning beds/booths. What side effects may I notice from receiving this medication? Side effects that you should report to your doctor or health care professional as soon as possible: allergic reactions like skin rash, itching or hives, swelling of the face, lips, or tongue low blood counts - this  medicine may decrease the number of white blood cells, red blood cells and platelets. You may be at increased risk for infections and bleeding. signs of infection - fever or chills, cough, sore throat, pain or difficulty passing urine signs of decreased platelets or bleeding - bruising, pinpoint red spots on the skin, black, tarry stools, blood in the urine signs of decreased red blood cells - unusually weak or tired, fainting spells, lightheadedness breathing problems changes in vision chest pain mouth sores nausea and vomiting pain, swelling, redness at site where injected pain, tingling, numbness in the hands or feet redness, swelling, or sores on hands or feet stomach pain unusual bleeding Side effects that usually do not require medical attention (report to your doctor or health care professional if they continue or are bothersome): changes in finger or toe nails diarrhea dry or itchy skin hair loss headache loss of appetite sensitivity of eyes to the light stomach upset unusually teary eyes This list may not describe all possible side effects. Call your doctor for medical advice about side effects. You may report side effects to FDA at 1-800-FDA-1088. Where should I  keep my medication? This drug is given in a hospital or clinic and will not be stored at home. NOTE: This sheet is a summary. It may not cover all possible information. If you have questions about this medicine, talk to your doctor, pharmacist, or health care provider.  2023 Elsevier/Gold Standard (2021-05-13 00:00:00) The chemotherapy medication bag should finish at 46 hours, 96 hours, or 7 days. For example, if your pump is scheduled for 46 hours and it was put on at 4:00 p.m., it should finish at 2:00 p.m. the day it is scheduled to come off regardless of your appointment time.     Estimated time to finish at 12:45 p.m. on Friday 12/30/2021.   If the display on your pump reads "Low Volume" and it is beeping, take the batteries out of the pump and come to the cancer center for it to be taken off.   If the pump alarms go off prior to the pump reading "Low Volume" then call [phone] and someone can assist you.  If the plunger comes out and the chemotherapy medication is leaking out, please use your home chemo spill kit to clean up the spill. Do NOT use paper towels or other household products.  If you have problems or questions regarding your pump, please call either [phone] (24 hours a day) or the cancer center Monday-Friday 8:00 a.m.- 4:30 p.m. at the clinic number and we will assist you. If you are unable to get assistance, then go to the nearest Emergency Department and ask the staff to contact the IV team for assistance.
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
## 2021-12-30 ENCOUNTER — Ambulatory Visit (INDEPENDENT_AMBULATORY_CARE_PROVIDER_SITE_OTHER): Payer: Commercial Managed Care - PPO | Admitting: Family

## 2021-12-30 ENCOUNTER — Telehealth: Payer: Self-pay | Admitting: Family

## 2021-12-30 ENCOUNTER — Inpatient Hospital Stay: Payer: Commercial Managed Care - PPO

## 2021-12-30 ENCOUNTER — Encounter: Payer: Self-pay | Admitting: Family

## 2021-12-30 ENCOUNTER — Telehealth: Payer: Self-pay

## 2021-12-30 ENCOUNTER — Telehealth: Payer: Self-pay | Admitting: *Deleted

## 2021-12-30 VITALS — BP 134/96 | HR 72 | Temp 98.7°F | Resp 20

## 2021-12-30 DIAGNOSIS — C20 Malignant neoplasm of rectum: Secondary | ICD-10-CM | POA: Diagnosis not present

## 2021-12-30 DIAGNOSIS — E1169 Type 2 diabetes mellitus with other specified complication: Secondary | ICD-10-CM

## 2021-12-30 DIAGNOSIS — Z5111 Encounter for antineoplastic chemotherapy: Secondary | ICD-10-CM | POA: Diagnosis not present

## 2021-12-30 DIAGNOSIS — E1165 Type 2 diabetes mellitus with hyperglycemia: Secondary | ICD-10-CM | POA: Diagnosis not present

## 2021-12-30 MED ORDER — LORAZEPAM 0.5 MG PO TABS: 0.5000 mg | ORAL_TABLET | Freq: Four times a day (QID) | ORAL | 0 refills | Status: DC | PRN

## 2021-12-30 MED ORDER — TRESIBA FLEXTOUCH 100 UNIT/ML ~~LOC~~ SOPN
8.0000 [IU] | PEN_INJECTOR | Freq: Every day | SUBCUTANEOUS | 1 refills | Status: DC
Start: 2021-12-30 — End: 2021-12-30

## 2021-12-30 MED ORDER — HEPARIN SOD (PORK) LOCK FLUSH 100 UNIT/ML IV SOLN
500.0000 [IU] | Freq: Once | INTRAVENOUS | Status: AC | PRN
  Administered 2021-12-30: 500 [IU]

## 2021-12-30 MED ORDER — TRULICITY 1.5 MG/0.5ML ~~LOC~~ SOAJ
1.5000 mg | SUBCUTANEOUS | 2 refills | Status: DC
Start: 2021-12-30 — End: 2022-10-10

## 2021-12-30 MED ORDER — ROSUVASTATIN CALCIUM 10 MG PO TABS: 10.0000 mg | ORAL_TABLET | Freq: Every day | ORAL | 1 refills | Status: DC

## 2021-12-30 MED ORDER — TOUJEO SOLOSTAR 300 UNIT/ML ~~LOC~~ SOPN
8.0000 [IU] | PEN_INJECTOR | Freq: Every evening | SUBCUTANEOUS | 2 refills | Status: DC
Start: 2021-12-30 — End: 2022-02-09

## 2021-12-30 MED ORDER — SODIUM CHLORIDE 0.9% FLUSH
10.0000 mL | INTRAVENOUS | Status: DC | PRN
  Administered 2021-12-30: 10 mL

## 2021-12-30 NOTE — Progress Notes (Signed)
Virtual Visit  Note Due to COVID-19 pandemic this visit was conducted virtually. This visit type was conducted due to national recommendations for restrictions regarding the COVID-19 Pandemic (e.g. social distancing, sheltering in place) in an effort to limit this patient's exposure and mitigate transmission in our community. All issues noted in this document were discussed and addressed.  A physical exam was not performed with this format.  I connected with [name] [name] on 12/30/21 at 1:27 pm  by telephone and verified that I am speaking with the correct person using two identifiers. [name] [name] is currently located at truck and wife is currently with him during visit. The provider, Evelina Dun, FNP is located in their office at time of visit.  I discussed the limitations, risks, security and privacy concerns of performing an evaluation and management service by telephone and the availability of in person appointments. I also discussed with the patient that there may be a patient responsible charge related to this service. The patient expressed understanding and agreed to proceed.  [name], pitstick are scheduled for a virtual visit with your provider today.    Just as we do with appointments in the office, we must obtain your consent to participate.  Your consent will be active for this visit and any virtual visit you may have with one of our providers in the next 365 days.    If you have a MyChart account, I can also send a copy of this consent to you electronically.  All virtual visits are billed to your insurance company just like a traditional visit in the office.  As this is a virtual visit, video technology does not allow for your provider to perform a traditional examination.  This may limit your provider's ability to fully assess your condition.  If your provider identifies any concerns that need to be evaluated in person or the need to arrange testing such as labs, EKG,  etc, we will make arrangements to do so.    Although advances in technology are sophisticated, we cannot ensure that it will always work on either your end or our end.  If the connection with a video visit is poor, we may have to switch to a telephone visit.  With either a video or telephone visit, we are not always able to ensure that we have a secure connection.   I need to obtain your verbal consent now.   Are you willing to proceed with your visit today?   [name] [name] has provided verbal consent on 12/30/2021 for a virtual visit (video or telephone).   Evelina Dun, [place] 12/30/2021  1:28 PM   History and Present Illness:  PT calls the office today with increased glucose. He has recently diagnosed with colon cancer and taking steroids for chemo.  Diabetes He presents for his follow-up diabetic visit. He has type 2 diabetes mellitus. There are no hypoglycemic associated symptoms. Pertinent negatives for diabetes include no blurred vision and no foot paresthesias. Symptoms are stable. Diabetic complications include heart disease. Pertinent negatives for diabetic complications include no nephropathy or peripheral neuropathy. Risk factors for coronary artery disease include dyslipidemia, diabetes mellitus, male sex, hypertension and sedentary lifestyle. He is following a generally healthy diet. His overall blood glucose range is >200 mg/dl. An ACE inhibitor/angiotensin II receptor blocker is being taken.      Review of Systems  Eyes:  Negative for blurred vision.     Observations/Objective: No SOB or distress noted   Assessment  and Plan: 1. Uncontrolled type 2 diabetes mellitus with hyperglycemia (HCC) - Dulaglutide (TRULICITY) 1.5 OP/9.2TW SOPN; Inject 1.5 mg into the skin once a week.  Dispense: 6 mL; Refill: 2 - insulin degludec (TRESIBA FLEXTOUCH) 100 UNIT/ML FlexTouch Pen; Inject 8 Units into the skin at bedtime.  Dispense: 6 mL; Refill: 1  2. Rectal cancer ([name])  3. Type  2 diabetes mellitus with other specified complication, without long-term current use of insulin (HCC) - Dulaglutide (TRULICITY) 1.5 KM/6.2MM SOPN; Inject 1.5 mg into the skin once a week.  Dispense: 6 mL; Refill: 2 - insulin degludec (TRESIBA FLEXTOUCH) 100 UNIT/ML FlexTouch Pen; Inject 8 Units into the skin at bedtime.  Dispense: 6 mL; Refill: 1  Continue Trulicity Will start Tresiba 8 units. After 4 days, can increase to 10 units if glucose continues to be >200. Low carb diet Keep chronic follow up with me    I discussed the assessment and treatment plan with the patient. The patient was provided an opportunity to ask questions and all were answered. The patient agreed with the plan and demonstrated an understanding of the instructions.   The patient was advised to call back or seek an in-person evaluation if the symptoms worsen or if the condition fails to improve as anticipated.  The above assessment and management plan was discussed with the patient. The patient verbalized understanding of and has agreed to the management plan. Patient is aware to call the clinic if symptoms persist or worsen. Patient is aware when to return to the clinic for a follow-up visit. Patient educated on when it is appropriate to go to the emergency department.   Time call ended:  1:50 pm   I provided 23 minutes of  non face-to-face time during this encounter.    Evelina Dun, FNP

## 2021-12-30 NOTE — Telephone Encounter (Signed)
  Prescription Request  12/30/2021  Is this a "Controlled Substance" medicine? rosuvastatin (CRESTOR) 10 MG tablet tamsulosin (FLOMAX) 0.4 MG CAPS capsule Have you seen your PCP in the last 2 weeks? today  If YES, route message to pool  -  If NO, patient needs to be scheduled for appointment.  What is the name of the medication or equipment? rosuvastatin (CRESTOR) 10 MG tablet tamsulosin (FLOMAX) 0.4 MG CAPS capsule  Have you contacted your pharmacy to request a refill? Pharm calling   Which pharmacy would you like this sent to? [facility]   Patient notified that their request is being sent to the clinical staff for review and that they should receive a response within 2 business days.

## 2021-12-30 NOTE — Patient Instructions (Signed)

## 2021-12-30 NOTE — Patient Instructions (Signed)
Heparin injection ?What is this medication? ?HEPARIN (HEP a rin) is an anticoagulant. It is used to treat or prevent clots in the veins, arteries, lungs, or heart. It stops clots from forming or getting bigger. This medicine prevents clotting during open-heart surgery, dialysis, or in patients who are confined to bed. ?This medicine may be used for other purposes; ask your health care provider or pharmacist if you have questions. ?COMMON BRAND NAME(S): Hep-Lock, Hep-Lock U/P, Hepflush-10, Monoject Prefill Advanced Heparin Lock Flush, SASH Normal Saline and Heparin ?What should I tell my care team before I take this medication? ?They need to know if you have any of these conditions: ?bleeding disorders, such as hemophilia or low blood platelets ?bowel disease or diverticulitis ?endocarditis ?high blood pressure ?liver disease ?recent surgery or delivery of a baby ?stomach ulcers ?an unusual or allergic reaction to heparin, benzyl alcohol, sulfites, other medicines, foods, dyes, or preservatives ?pregnant or trying to get pregnant ?breast-feeding ?How should I use this medication? ?This medicine is given by injection or infusion into a vein. It can also be given by injection of small amounts under the skin. It is usually given by a health care professional in a hospital or clinic setting. ?If you get this medicine at home, you will be taught how to prepare and give this medicine. Use exactly as directed. Take your medicine at regular intervals. Do not take it more often than directed. Do not stop taking except on your doctor's advice. Stopping this medicine may increase your risk of a blot clot. Be sure to refill your prescription before you run out of medicine. ?It is important that you put your used needles and syringes in a special sharps container. Do not put them in a trash can. If you do not have a sharps container, call your pharmacist or healthcare provider to get one. ?Talk to your pediatrician regarding the  use of this medicine in children. While this medicine may be prescribed for children for selected conditions, precautions do apply. ?Overdosage: If you think you have taken too much of this medicine contact a poison control center or emergency room at once. ?NOTE: This medicine is only for you. Do not share this medicine with others. ?What if I miss a dose? ?If you miss a dose, take it as soon as you can. If it is almost time for your next dose, take only that dose. Do not take double or extra doses. ?What may interact with this medication? ?Do not take this medicine with any of the following medications: ?aspirin and aspirin-like drugs ?mifepristone ?medicines that treat or prevent blood clots like warfarin, enoxaparin, and dalteparin ?palifermin ?protamine ?This medicine may also interact with the following medications: ?dextran ?digoxin ?hydroxychloroquine ?medicines for treating colds or allergies ?nicotine ?NSAIDs, medicines for pain and inflammation, like ibuprofen or naproxen ?phenylbutazone ?tetracycline antibiotics ?This list may not describe all possible interactions. Give your health care provider a list of all the medicines, herbs, non-prescription drugs, or dietary supplements you use. Also tell them if you smoke, drink alcohol, or use illegal drugs. Some items may interact with your medicine. ?What should I watch for while using this medication? ?Visit your healthcare professional for regular checks on your progress. You may need blood work done while you are taking this medicine. Your condition will be monitored carefully while you are receiving this medicine. It is important not to miss any appointments. ?Wear a medical ID bracelet or chain, and carry a card that describes your disease and details   of your medicine and dosage times. ?Notify your doctor or healthcare professional at once if you have cold, blue hands or feet. ?If you are going to need surgery or other procedure, tell your healthcare  professional that you are using this medicine. ?Avoid sports and activities that might cause injury while you are using this medicine. Severe falls or injuries can cause unseen bleeding. Be careful when using sharp tools or knives. Consider using an electric razor. Take special care brushing or flossing your teeth. Report any injuries, bruising, or red spots on the skin to your healthcare professional. ?Using this medicine for a long time may weaken your bones and increase the risk of bone fractures. ?You should make sure that you get enough calcium and vitamin D while you are taking this medicine. Discuss the foods you eat and the vitamins you take with your healthcare professional. ?Wear a medical ID bracelet or chain. Carry a card that describes your disease and details of your medicine and dosage times. ?What side effects may I notice from receiving this medication? ?Side effects that you should report to your doctor or health care professional as soon as possible: ?allergic reactions like skin rash, itching or hives, swelling of the face, lips, or tongue ?bone pain ?fever, chills ?nausea, vomiting ?signs and symptoms of bleeding such as bloody or black, tarry stools; red or dark-brown urine; spitting up blood or brown material that looks like coffee grounds; red spots on the skin; unusual bruising or bleeding from the eye, gums, or nose ?signs and symptoms of a blood clot such as chest pain; shortness of breath; pain, swelling, or warmth in the leg ?signs and symptoms of a stroke such as changes in vision; confusion; trouble speaking or understanding; severe headaches; sudden numbness or weakness of the face, arm or leg; trouble walking; dizziness; loss of coordination ?Side effects that usually do not require medical attention (report to your doctor or health care professional if they continue or are bothersome): ?hair loss ?pain, redness, or irritation at site where injected ?This list may not describe all  possible side effects. Call your doctor for medical advice about side effects. You may report side effects to FDA at 1-800-FDA-1088. ?Where should I keep my medication? ?Keep out of the reach of children. ?Store unopened vials at room temperature between 15 and 30 degrees C (59 and 86 degrees F). Do not freeze. Do not use if solution is discolored or particulate matter is present. Throw away any unused medicine after the expiration date. ?NOTE: This sheet is a summary. It may not cover all possible information. If you have questions about this medicine, talk to your doctor, pharmacist, or health care provider. ?? 2023 Elsevier/Gold Standard (2020-07-22 00:00:00) ? ?

## 2021-12-30 NOTE — Telephone Encounter (Signed)
Per Dr. Stevphen Meuse to try lorazepam 0.5 SL every 6 hours prn nausea. Notified patient and also instructed him to take his Prilosec daily instead of prn to help his heartburn, which is very common with oxaliplatin. He understands and agrees to try these two suggestions. Reports that his PCP is starting him on insulin.

## 2021-12-30 NOTE — Addendum Note (Signed)
Addended by: Evelina Dun A on: 12/30/2021 03:10 PM   Modules accepted: Orders

## 2021-12-30 NOTE — Telephone Encounter (Signed)
[name] (Key: [id number]) Rx #: [id number] Tyler Aas FlexTouch (insulin degludec injection) 100 Units/mL solution   Form [facility] Electronic Prior Authorization Form (2017 NCPDP) Created 1 hour ago Sent to Plan less than a minute ago Determination Wait for Questions [facility] 2017 NCPDP typically responds with questions in less than 15 minutes, but may take up to 24 hours.

## 2022-01-02 MED ORDER — INSULIN PEN NEEDLE 29G X 5MM MISC: 1.0000 | Freq: Every day | 2 refills | Status: DC

## 2022-01-08 ENCOUNTER — Other Ambulatory Visit: Payer: Self-pay | Admitting: Oncology

## 2022-01-09 ENCOUNTER — Ambulatory Visit: Payer: Commercial Managed Care - PPO | Admitting: Nurse Practitioner

## 2022-01-09 ENCOUNTER — Ambulatory Visit: Payer: Commercial Managed Care - PPO

## 2022-01-09 ENCOUNTER — Other Ambulatory Visit: Payer: Commercial Managed Care - PPO

## 2022-01-11 ENCOUNTER — Inpatient Hospital Stay: Payer: Commercial Managed Care - PPO

## 2022-01-11 ENCOUNTER — Encounter: Payer: Self-pay | Admitting: *Deleted

## 2022-01-11 ENCOUNTER — Inpatient Hospital Stay: Payer: Commercial Managed Care - PPO | Admitting: Oncology

## 2022-01-11 ENCOUNTER — Other Ambulatory Visit: Payer: Self-pay | Admitting: *Deleted

## 2022-01-11 VITALS — BP 143/94 | HR 100 | Temp 97.3°F | Resp 16 | Wt 263.0 lb

## 2022-01-11 VITALS — BP 133/87 | HR 88

## 2022-01-11 DIAGNOSIS — C20 Malignant neoplasm of rectum: Secondary | ICD-10-CM

## 2022-01-11 DIAGNOSIS — Z5111 Encounter for antineoplastic chemotherapy: Secondary | ICD-10-CM | POA: Diagnosis not present

## 2022-01-11 LAB — CMP (CANCER CENTER ONLY)
ALT: 26 U/L (ref 0–44)
AST: 18 U/L (ref 15–41)
Albumin: 4.2 g/dL (ref 3.5–5.0)
Alkaline Phosphatase: 73 U/L (ref 38–126)
Anion gap: 12 (ref 5–15)
BUN: 17 mg/dL (ref 8–23)
CO2: 21 mmol/L — ABNORMAL LOW (ref 22–32)
Calcium: 9.1 mg/dL (ref 8.9–10.3)
Chloride: 103 mmol/L (ref 98–111)
Creatinine: 0.92 mg/dL (ref 0.61–1.24)
GFR, Estimated: >60 mL/min (ref >60)
Glucose, Bld: 255 mg/dL — ABNORMAL HIGH (ref 70–99)
Potassium: 4 mmol/L (ref 3.5–5.1)
Sodium: 136 mmol/L (ref 135–145)
Total Bilirubin: 0.5 mg/dL (ref 0.3–1.2)
Total Protein: 6.7 g/dL (ref 6.5–8.1)

## 2022-01-11 LAB — CBC WITH DIFFERENTIAL (CANCER CENTER ONLY)
Abs Immature Granulocytes: 0.02 10*3/uL (ref 0.00–0.07)
Basophils Absolute: 0.1 10*3/uL (ref 0.0–0.1)
Basophils Relative: 2 %
Eosinophils Absolute: 0.4 10*3/uL (ref 0.0–0.5)
Eosinophils Relative: 9 %
HCT: 46.4 % (ref 39.0–52.0)
Hemoglobin: 15.8 g/dL (ref 13.0–17.0)
Immature Granulocytes: 0 %
Lymphocytes Relative: 25 %
Lymphs Abs: 1.1 10*3/uL (ref 0.7–4.0)
MCH: 29.3 pg (ref 26.0–34.0)
MCHC: 34.1 g/dL (ref 30.0–36.0)
MCV: 86.1 fL (ref 80.0–100.0)
Monocytes Absolute: 0.3 10*3/uL (ref 0.1–1.0)
Monocytes Relative: 8 %
Neutro Abs: 2.5 10*3/uL (ref 1.7–7.7)
Neutrophils Relative %: 56 %
Platelet Count: 119 10*3/uL — ABNORMAL LOW (ref 150–400)
RBC: 5.39 MIL/uL (ref 4.22–5.81)
RDW: 15.3 % (ref 11.5–15.5)
WBC Count: 4.5 10*3/uL (ref 4.0–10.5)
nRBC: 0 % (ref 0.0–0.2)

## 2022-01-11 LAB — CEA (ACCESS): CEA (CHCC): 3.86 ng/mL (ref 0.00–5.00)

## 2022-01-11 MED ORDER — SODIUM CHLORIDE 0.9 % IV SOLN
150.0000 mg | Freq: Once | INTRAVENOUS | Status: AC
  Administered 2022-01-11: 150 mg via INTRAVENOUS
  Filled 2022-01-11: qty 150

## 2022-01-11 MED ORDER — OXALIPLATIN CHEMO INJECTION 100 MG/20ML
65.0000 mg/m2 | Freq: Once | INTRAVENOUS | Status: AC
  Administered 2022-01-11: 160 mg via INTRAVENOUS
  Filled 2022-01-11: qty 32

## 2022-01-11 MED ORDER — SODIUM CHLORIDE 0.9 % IV SOLN
1920.0000 mg/m2 | INTRAVENOUS | Status: DC
  Administered 2022-01-11: 4750 mg via INTRAVENOUS
  Filled 2022-01-11: qty 95

## 2022-01-11 MED ORDER — SODIUM CHLORIDE 0.9 % IV SOLN
10.0000 mg | Freq: Once | INTRAVENOUS | Status: AC
  Administered 2022-01-11: 10 mg via INTRAVENOUS
  Filled 2022-01-11: qty 10

## 2022-01-11 MED ORDER — SORBITOL 70 % PO SOLN: 15.0000 mL | Freq: Two times a day (BID) | ORAL | 1 refills | Status: DC

## 2022-01-11 MED ORDER — FLUOROURACIL CHEMO INJECTION 2.5 GM/50ML
300.0000 mg/m2 | Freq: Once | INTRAVENOUS | Status: AC
  Administered 2022-01-11: 750 mg via INTRAVENOUS
  Filled 2022-01-11: qty 15

## 2022-01-11 MED ORDER — DEXTROSE 5 % IV SOLN: Freq: Once | INTRAVENOUS | Status: AC

## 2022-01-11 MED ORDER — PALONOSETRON HCL INJECTION 0.25 MG/5ML
0.2500 mg | Freq: Once | INTRAVENOUS | Status: AC
  Administered 2022-01-11: 0.25 mg via INTRAVENOUS
  Filled 2022-01-11: qty 5

## 2022-01-11 MED ORDER — LEUCOVORIN CALCIUM INJECTION 350 MG
300.0000 mg/m2 | Freq: Once | INTRAVENOUS | Status: AC
  Administered 2022-01-11: 742 mg via INTRAVENOUS
  Filled 2022-01-11: qty 37.1
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
## 2022-01-11 NOTE — Progress Notes (Signed)
Patient seen by Dr. Benay Spice today  Vitals are within treatment parameters.  Labs reviewed by Dr. Benay Spice and are within treatment parameters.  Per physician team, patient is ready for treatment. Please note that modifications are being made to the treatment plan including Dose reduction of Oxaliplatin.

## 2022-01-11 NOTE — Patient Instructions (Signed)
[facility]   Discharge Instructions: Thank you for choosing [facility] to provide your oncology and hematology care.   If you have a lab appointment with the [facility], please go directly to the [facility] and check in at the registration area.   Wear comfortable clothing and clothing appropriate for easy access to any Portacath or PICC line.   We strive to give you quality time with your provider. You may need to reschedule your appointment if you arrive late (15 or more minutes).  Arriving late affects you and other patients whose appointments are after yours.  Also, if you miss three or more appointments without notifying the office, you may be dismissed from the clinic at the provider's discretion.      For prescription refill requests, have your pharmacy contact our office and allow 72 hours for refills to be completed.    Today you received the following chemotherapy and/or immunotherapy agents Oxaliplatin (ELOXATIN), Leucovorin & Flourouracil (ADRUCIL).      To help prevent nausea and vomiting after your treatment, we encourage you to take your nausea medication as directed.  BELOW ARE SYMPTOMS THAT SHOULD BE REPORTED IMMEDIATELY: *FEVER GREATER THAN 100.4 F (38 C) OR HIGHER *CHILLS OR SWEATING *NAUSEA AND VOMITING THAT IS NOT CONTROLLED WITH YOUR NAUSEA MEDICATION *UNUSUAL SHORTNESS OF BREATH *UNUSUAL BRUISING OR BLEEDING *URINARY PROBLEMS (pain or burning when urinating, or frequent urination) *BOWEL PROBLEMS (unusual diarrhea, constipation, pain near the anus) TENDERNESS IN MOUTH AND THROAT WITH OR WITHOUT PRESENCE OF ULCERS (sore throat, sores in mouth, or a toothache) UNUSUAL RASH, SWELLING OR PAIN  UNUSUAL VAGINAL DISCHARGE OR ITCHING   Items with * indicate a potential emergency and should be followed up as soon as possible or go to the Emergency Department if any problems should occur.  Please show the CHEMOTHERAPY ALERT  CARD or IMMUNOTHERAPY ALERT CARD at check-in to the Emergency Department and triage nurse.  Should you have questions after your visit or need to cancel or reschedule your appointment, please contact [facility]  Dept: [phone]  and follow the prompts.  Office hours are 8:00 a.m. to 4:30 p.m. Monday - Friday. Please note that voicemails left after 4:00 p.m. may not be returned until the following business day.  We are closed weekends and major holidays. You have access to a nurse at all times for urgent questions. Please call the main number to the clinic Dept: [phone] and follow the prompts.   For any non-urgent questions, you may also contact your provider using MyChart. We now offer e-Visits for anyone 68 and older to request care online for non-urgent symptoms. For details visit [email or web address].   Also download the MyChart app! Go to the app store, search "MyChart", open the app, select [facility], and log in with your MyChart username and password.  Masks are optional in the cancer centers. If you would like for your care team to wear a mask while they are taking care of you, please let them know. For doctor visits, patients may have with them one support person who is at least 62 years old. At this time, visitors are not allowed in the infusion area.  Oxaliplatin Injection What is this medication? OXALIPLATIN (ox AL i PLA tin) is a chemotherapy drug. It targets fast dividing cells, like cancer cells, and causes these cells to die. This medicine is used to treat cancers of the colon and rectum, and many  other cancers. This medicine may be used for other purposes; ask your health care provider or pharmacist if you have questions. COMMON BRAND NAME(S): Eloxatin What should I tell my care team before I take this medication? They need to know if you have any of these conditions: heart disease history of irregular heartbeat liver disease low blood  counts, like white cells, platelets, or red blood cells lung or breathing disease, like asthma take medicines that treat or prevent blood clots tingling of the fingers or toes, or other nerve disorder an unusual or allergic reaction to oxaliplatin, other chemotherapy, other medicines, foods, dyes, or preservatives pregnant or trying to get pregnant breast-feeding How should I use this medication? This drug is given as an infusion into a vein. It is administered in a hospital or clinic by a specially trained health care professional. Talk to your pediatrician regarding the use of this medicine in children. Special care may be needed. Overdosage: If you think you have taken too much of this medicine contact a poison control center or emergency room at once. NOTE: This medicine is only for you. Do not share this medicine with others. What if I miss a dose? It is important not to miss a dose. Call your doctor or health care professional if you are unable to keep an appointment. What may interact with this medication? Do not take this medicine with any of the following medications: cisapride dronedarone pimozide thioridazine This medicine may also interact with the following medications: aspirin and aspirin-like medicines certain medicines that treat or prevent blood clots like warfarin, apixaban, dabigatran, and rivaroxaban cisplatin cyclosporine diuretics medicines for infection like acyclovir, adefovir, amphotericin B, bacitracin, cidofovir, foscarnet, ganciclovir, gentamicin, pentamidine, vancomycin NSAIDs, medicines for pain and inflammation, like ibuprofen or naproxen other medicines that prolong the QT interval (an abnormal heart rhythm) pamidronate zoledronic acid This list may not describe all possible interactions. Give your health care provider a list of all the medicines, herbs, non-prescription drugs, or dietary supplements you use. Also tell them if you smoke, drink alcohol,  or use illegal drugs. Some items may interact with your medicine. What should I watch for while using this medication? Your condition will be monitored carefully while you are receiving this medicine. You may need blood work done while you are taking this medicine. This medicine may make you feel generally unwell. This is not uncommon as chemotherapy can affect healthy cells as well as cancer cells. Report any side effects. Continue your course of treatment even though you feel ill unless your healthcare professional tells you to stop. This medicine can make you more sensitive to cold. Do not drink cold drinks or use ice. Cover exposed skin before coming in contact with cold temperatures or cold objects. When out in cold weather wear warm clothing and cover your mouth and nose to warm the air that goes into your lungs. Tell your doctor if you get sensitive to the cold. Do not become pregnant while taking this medicine or for 9 months after stopping it. Women should inform their health care professional if they wish to become pregnant or think they might be pregnant. Men should not father a child while taking this medicine and for 6 months after stopping it. There is potential for serious side effects to an unborn child. Talk to your health care professional for more information. Do not breast-feed a child while taking this medicine or for 3 months after stopping it. This medicine has caused ovarian failure in some  women. This medicine may make it more difficult to get pregnant. Talk to your health care professional if you are concerned about your fertility. This medicine has caused decreased sperm counts in some men. This may make it more difficult to father a child. Talk to your health care professional if you are concerned about your fertility. This medicine may increase your risk of getting an infection. Call your health care professional for advice if you get a fever, chills, or sore throat, or other  symptoms of a cold or flu. Do not treat yourself. Try to avoid being around people who are sick. Avoid taking medicines that contain aspirin, acetaminophen, ibuprofen, naproxen, or ketoprofen unless instructed by your health care professional. These medicines may hide a fever. Be careful brushing or flossing your teeth or using a toothpick because you may get an infection or bleed more easily. If you have any dental work done, tell your dentist you are receiving this medicine. What side effects may I notice from receiving this medication? Side effects that you should report to your doctor or health care professional as soon as possible: allergic reactions like skin rash, itching or hives, swelling of the face, lips, or tongue breathing problems cough low blood counts - this medicine may decrease the number of white blood cells, red blood cells, and platelets. You may be at increased risk for infections and bleeding nausea, vomiting pain, redness, or irritation at site where injected pain, tingling, numbness in the hands or feet signs and symptoms of bleeding such as bloody or black, tarry stools; red or dark brown urine; spitting up blood or brown material that looks like coffee grounds; red spots on the skin; unusual bruising or bleeding from the eyes, gums, or nose signs and symptoms of a dangerous change in heartbeat or heart rhythm like chest pain; dizziness; fast, irregular heartbeat; palpitations; feeling faint or lightheaded; falls signs and symptoms of infection like fever; chills; cough; sore throat; pain or trouble passing urine signs and symptoms of liver injury like dark yellow or brown urine; general ill feeling or flu-like symptoms; light-colored stools; loss of appetite; nausea; right upper belly pain; unusually weak or tired; yellowing of the eyes or skin signs and symptoms of low red blood cells or anemia such as unusually weak or tired; feeling faint or lightheaded; falls signs and  symptoms of muscle injury like dark urine; trouble passing urine or change in the amount of urine; unusually weak or tired; muscle pain; back pain Side effects that usually do not require medical attention (report to your doctor or health care professional if they continue or are bothersome): changes in taste diarrhea gas hair loss loss of appetite mouth sores This list may not describe all possible side effects. Call your doctor for medical advice about side effects. You may report side effects to FDA at 1-800-FDA-1088. Where should I keep my medication? This drug is given in a hospital or clinic and will not be stored at home. NOTE: This sheet is a summary. It may not cover all possible information. If you have questions about this medicine, talk to your doctor, pharmacist, or health care provider.  2023 Elsevier/Gold Standard (2021-05-13 00:00:00) Leucovorin injection What is this medication? LEUCOVORIN (loo koe VOR in) is used to prevent or treat the harmful effects of some medicines. This medicine is used to treat anemia caused by a low amount of folic acid in the body. It is also used with 5-fluorouracil (5-FU) to treat colon cancer. This  medicine may be used for other purposes; ask your health care provider or pharmacist if you have questions. What should I tell my care team before I take this medication? They need to know if you have any of these conditions: anemia from low levels of vitamin B-12 in the blood an unusual or allergic reaction to leucovorin, folic acid, other medicines, foods, dyes, or preservatives pregnant or trying to get pregnant breast-feeding How should I use this medication? This medicine is for injection into a muscle or into a vein. It is given by a health care professional in a hospital or clinic setting. Talk to your pediatrician regarding the use of this medicine in children. Special care may be needed. Overdosage: If you think you have taken too much of  this medicine contact a poison control center or emergency room at once. NOTE: This medicine is only for you. Do not share this medicine with others. What if I miss a dose? This does not apply. What may interact with this medication? capecitabine fluorouracil phenobarbital phenytoin primidone trimethoprim-sulfamethoxazole This list may not describe all possible interactions. Give your health care provider a list of all the medicines, herbs, non-prescription drugs, or dietary supplements you use. Also tell them if you smoke, drink alcohol, or use illegal drugs. Some items may interact with your medicine. What should I watch for while using this medication? Your condition will be monitored carefully while you are receiving this medicine. This medicine may increase the side effects of 5-fluorouracil, 5-FU. Tell your doctor or health care professional if you have diarrhea or mouth sores that do not get better or that get worse. What side effects may I notice from receiving this medication? Side effects that you should report to your doctor or health care professional as soon as possible: allergic reactions like skin rash, itching or hives, swelling of the face, lips, or tongue breathing problems fever, infection mouth sores unusual bleeding or bruising unusually weak or tired Side effects that usually do not require medical attention (report to your doctor or health care professional if they continue or are bothersome): constipation or diarrhea loss of appetite nausea, vomiting This list may not describe all possible side effects. Call your doctor for medical advice about side effects. You may report side effects to FDA at 1-800-FDA-1088. Where should I keep my medication? This drug is given in a hospital or clinic and will not be stored at home. NOTE: This sheet is a summary. It may not cover all possible information. If you have questions about this medicine, talk to your doctor,  pharmacist, or health care provider.  2023 Elsevier/Gold Standard (2007-12-19 00:00:00) Fluorouracil, 5-FU injection What is this medication? FLUOROURACIL, 5-FU (flure oh YOOR a sil) is a chemotherapy drug. It slows the growth of cancer cells. This medicine is used to treat many types of cancer like breast cancer, colon or rectal cancer, pancreatic cancer, and stomach cancer. This medicine may be used for other purposes; ask your health care provider or pharmacist if you have questions. COMMON BRAND NAME(S): Adrucil What should I tell my care team before I take this medication? They need to know if you have any of these conditions: blood disorders dihydropyrimidine dehydrogenase (DPD) deficiency infection (especially a virus infection such as chickenpox, cold sores, or herpes) kidney disease liver disease malnourished, poor nutrition recent or ongoing radiation therapy an unusual or allergic reaction to fluorouracil, other chemotherapy, other medicines, foods, dyes, or preservatives pregnant or trying to get pregnant breast-feeding How should  I use this medication? This drug is given as an infusion or injection into a vein. It is administered in a hospital or clinic by a specially trained health care professional. Talk to your pediatrician regarding the use of this medicine in children. Special care may be needed. Overdosage: If you think you have taken too much of this medicine contact a poison control center or emergency room at once. NOTE: This medicine is only for you. Do not share this medicine with others. What if I miss a dose? It is important not to miss your dose. Call your doctor or health care professional if you are unable to keep an appointment. What may interact with this medication? Do not take this medicine with any of the following medications: live virus vaccines This medicine may also interact with the following medications: medicines that treat or prevent blood  clots like warfarin, enoxaparin, and dalteparin This list may not describe all possible interactions. Give your health care provider a list of all the medicines, herbs, non-prescription drugs, or dietary supplements you use. Also tell them if you smoke, drink alcohol, or use illegal drugs. Some items may interact with your medicine. What should I watch for while using this medication? Visit your doctor for checks on your progress. This drug may make you feel generally unwell. This is not uncommon, as chemotherapy can affect healthy cells as well as cancer cells. Report any side effects. Continue your course of treatment even though you feel ill unless your doctor tells you to stop. In some cases, you may be given additional medicines to help with side effects. Follow all directions for their use. Call your doctor or health care professional for advice if you get a fever, chills or sore throat, or other symptoms of a cold or flu. Do not treat yourself. This drug decreases your body's ability to fight infections. Try to avoid being around people who are sick. This medicine may increase your risk to bruise or bleed. Call your doctor or health care professional if you notice any unusual bleeding. Be careful brushing and flossing your teeth or using a toothpick because you may get an infection or bleed more easily. If you have any dental work done, tell your dentist you are receiving this medicine. Avoid taking products that contain aspirin, acetaminophen, ibuprofen, naproxen, or ketoprofen unless instructed by your doctor. These medicines may hide a fever. Do not become pregnant while taking this medicine. Women should inform their doctor if they wish to become pregnant or think they might be pregnant. There is a potential for serious side effects to an unborn child. Talk to your health care professional or pharmacist for more information. Do not breast-feed an infant while taking this medicine. Men should  inform their doctor if they wish to father a child. This medicine may lower sperm counts. Do not treat diarrhea with over the counter products. Contact your doctor if you have diarrhea that lasts more than 2 days or if it is severe and watery. This medicine can make you more sensitive to the sun. Keep out of the sun. If you cannot avoid being in the sun, wear protective clothing and use sunscreen. Do not use sun lamps or tanning beds/booths. What side effects may I notice from receiving this medication? Side effects that you should report to your doctor or health care professional as soon as possible: allergic reactions like skin rash, itching or hives, swelling of the face, lips, or tongue low blood counts - this  medicine may decrease the number of white blood cells, red blood cells and platelets. You may be at increased risk for infections and bleeding. signs of infection - fever or chills, cough, sore throat, pain or difficulty passing urine signs of decreased platelets or bleeding - bruising, pinpoint red spots on the skin, black, tarry stools, blood in the urine signs of decreased red blood cells - unusually weak or tired, fainting spells, lightheadedness breathing problems changes in vision chest pain mouth sores nausea and vomiting pain, swelling, redness at site where injected pain, tingling, numbness in the hands or feet redness, swelling, or sores on hands or feet stomach pain unusual bleeding Side effects that usually do not require medical attention (report to your doctor or health care professional if they continue or are bothersome): changes in finger or toe nails diarrhea dry or itchy skin hair loss headache loss of appetite sensitivity of eyes to the light stomach upset unusually teary eyes This list may not describe all possible side effects. Call your doctor for medical advice about side effects. You may report side effects to FDA at 1-800-FDA-1088. Where should I  keep my medication? This drug is given in a hospital or clinic and will not be stored at home. NOTE: This sheet is a summary. It may not cover all possible information. If you have questions about this medicine, talk to your doctor, pharmacist, or health care provider.  2023 Elsevier/Gold Standard (2021-05-13 00:00:00) The chemotherapy medication bag should finish at 46 hours, 96 hours, or 7 days. For example, if your pump is scheduled for 46 hours and it was put on at 4:00 p.m., it should finish at 2:00 p.m. the day it is scheduled to come off regardless of your appointment time.     Estimated time to finish at 12:45 p.m. on Friday 01/13/2022.   If the display on your pump reads "Low Volume" and it is beeping, take the batteries out of the pump and come to the cancer center for it to be taken off.   If the pump alarms go off prior to the pump reading "Low Volume" then call [phone] and someone can assist you.  If the plunger comes out and the chemotherapy medication is leaking out, please use your home chemo spill kit to clean up the spill. Do NOT use paper towels or other household products.  If you have problems or questions regarding your pump, please call either [phone] (24 hours a day) or the cancer center Monday-Friday 8:00 a.m.- 4:30 p.m. at the clinic number and we will assist you. If you are unable to get assistance, then go to the nearest Emergency Department and ask the staff to contact the IV team for assistance.

## 2022-01-13 ENCOUNTER — Inpatient Hospital Stay: Payer: Commercial Managed Care - PPO

## 2022-01-13 VITALS — BP 121/86 | HR 95 | Temp 97.8°F | Resp 18

## 2022-01-13 DIAGNOSIS — Z5111 Encounter for antineoplastic chemotherapy: Secondary | ICD-10-CM | POA: Diagnosis not present

## 2022-01-13 DIAGNOSIS — C20 Malignant neoplasm of rectum: Secondary | ICD-10-CM

## 2022-01-13 MED ORDER — HEPARIN SOD (PORK) LOCK FLUSH 100 UNIT/ML IV SOLN
500.0000 [IU] | Freq: Once | INTRAVENOUS | Status: AC | PRN
  Administered 2022-01-13: 500 [IU]

## 2022-01-13 MED ORDER — SODIUM CHLORIDE 0.9% FLUSH
10.0000 mL | INTRAVENOUS | Status: DC | PRN
  Administered 2022-01-13: 10 mL

## 2022-01-13 NOTE — Patient Instructions (Signed)

## 2022-01-16 ENCOUNTER — Other Ambulatory Visit: Payer: Self-pay | Admitting: *Deleted

## 2022-01-16 ENCOUNTER — Other Ambulatory Visit: Payer: Self-pay

## 2022-01-16 MED ORDER — LORAZEPAM 0.5 MG PO TABS: 0.5000 mg | ORAL_TABLET | Freq: Four times a day (QID) | ORAL | 0 refills | Status: DC | PRN

## 2022-01-16 NOTE — Telephone Encounter (Signed)
Pharmacy called to request refill on lorazepam 0.5 mg. Script approved.

## 2022-01-16 NOTE — Telephone Encounter (Signed)
[facility] does not handle this review. Please visit [email or web address] to start a prior authorization or call [phone]

## 2022-01-22 ENCOUNTER — Other Ambulatory Visit: Payer: Self-pay | Admitting: Oncology

## 2022-01-24 ENCOUNTER — Other Ambulatory Visit: Payer: Self-pay

## 2022-01-25 ENCOUNTER — Inpatient Hospital Stay: Payer: Commercial Managed Care - PPO

## 2022-01-25 ENCOUNTER — Inpatient Hospital Stay: Payer: Commercial Managed Care - PPO | Attending: Oncology | Admitting: Oncology

## 2022-01-25 ENCOUNTER — Encounter: Payer: Self-pay | Admitting: *Deleted

## 2022-01-25 VITALS — BP 127/82 | HR 96 | Temp 98.2°F | Resp 18 | Ht 72.0 in | Wt 264.0 lb

## 2022-01-25 DIAGNOSIS — R160 Hepatomegaly, not elsewhere classified: Secondary | ICD-10-CM | POA: Insufficient documentation

## 2022-01-25 DIAGNOSIS — I700 Atherosclerosis of aorta: Secondary | ICD-10-CM | POA: Insufficient documentation

## 2022-01-25 DIAGNOSIS — Z803 Family history of malignant neoplasm of breast: Secondary | ICD-10-CM | POA: Diagnosis not present

## 2022-01-25 DIAGNOSIS — C787 Secondary malignant neoplasm of liver and intrahepatic bile duct: Secondary | ICD-10-CM | POA: Insufficient documentation

## 2022-01-25 DIAGNOSIS — K5730 Diverticulosis of large intestine without perforation or abscess without bleeding: Secondary | ICD-10-CM | POA: Insufficient documentation

## 2022-01-25 DIAGNOSIS — C20 Malignant neoplasm of rectum: Secondary | ICD-10-CM | POA: Insufficient documentation

## 2022-01-25 DIAGNOSIS — K760 Fatty (change of) liver, not elsewhere classified: Secondary | ICD-10-CM | POA: Insufficient documentation

## 2022-01-25 DIAGNOSIS — C19 Malignant neoplasm of rectosigmoid junction: Secondary | ICD-10-CM | POA: Insufficient documentation

## 2022-01-25 DIAGNOSIS — K5900 Constipation, unspecified: Secondary | ICD-10-CM | POA: Insufficient documentation

## 2022-01-25 DIAGNOSIS — Z5111 Encounter for antineoplastic chemotherapy: Secondary | ICD-10-CM | POA: Insufficient documentation

## 2022-01-25 LAB — CMP (CANCER CENTER ONLY)
ALT: 27 U/L (ref 0–44)
AST: 18 U/L (ref 15–41)
Albumin: 4.2 g/dL (ref 3.5–5.0)
Alkaline Phosphatase: 75 U/L (ref 38–126)
Anion gap: 12 (ref 5–15)
BUN: 15 mg/dL (ref 8–23)
CO2: 22 mmol/L (ref 22–32)
Calcium: 9.1 mg/dL (ref 8.9–10.3)
Chloride: 102 mmol/L (ref 98–111)
Creatinine: 0.94 mg/dL (ref 0.61–1.24)
GFR, Estimated: >60 mL/min (ref >60)
Glucose, Bld: 234 mg/dL — ABNORMAL HIGH (ref 70–99)
Potassium: 4.4 mmol/L (ref 3.5–5.1)
Sodium: 136 mmol/L (ref 135–145)
Total Bilirubin: 0.6 mg/dL (ref 0.3–1.2)
Total Protein: 6.2 g/dL — ABNORMAL LOW (ref 6.5–8.1)

## 2022-01-25 LAB — CBC WITH DIFFERENTIAL (CANCER CENTER ONLY)
Abs Immature Granulocytes: 0.01 10*3/uL (ref 0.00–0.07)
Basophils Absolute: 0.1 10*3/uL (ref 0.0–0.1)
Basophils Relative: 2 %
Eosinophils Absolute: 0.4 10*3/uL (ref 0.0–0.5)
Eosinophils Relative: 8 %
HCT: 45.3 % (ref 39.0–52.0)
Hemoglobin: 15.3 g/dL (ref 13.0–17.0)
Immature Granulocytes: 0 %
Lymphocytes Relative: 19 %
Lymphs Abs: 0.9 10*3/uL (ref 0.7–4.0)
MCH: 28.9 pg (ref 26.0–34.0)
MCHC: 33.8 g/dL (ref 30.0–36.0)
MCV: 85.6 fL (ref 80.0–100.0)
Monocytes Absolute: 0.4 10*3/uL (ref 0.1–1.0)
Monocytes Relative: 9 %
Neutro Abs: 3 10*3/uL (ref 1.7–7.7)
Neutrophils Relative %: 62 %
Platelet Count: 130 10*3/uL — ABNORMAL LOW (ref 150–400)
RBC: 5.29 MIL/uL (ref 4.22–5.81)
RDW: 15.9 % — ABNORMAL HIGH (ref 11.5–15.5)
WBC Count: 4.9 10*3/uL (ref 4.0–10.5)
nRBC: 0 % (ref 0.0–0.2)

## 2022-01-25 LAB — CEA (ACCESS): CEA (CHCC): 2.77 ng/mL (ref 0.00–5.00)

## 2022-01-25 MED ORDER — FLUOROURACIL CHEMO INJECTION 2.5 GM/50ML
300.0000 mg/m2 | Freq: Once | INTRAVENOUS | Status: AC
  Administered 2022-01-25: 750 mg via INTRAVENOUS
  Filled 2022-01-25: qty 15

## 2022-01-25 MED ORDER — OXALIPLATIN CHEMO INJECTION 100 MG/20ML
65.0000 mg/m2 | Freq: Once | INTRAVENOUS | Status: AC
  Administered 2022-01-25: 160 mg via INTRAVENOUS
  Filled 2022-01-25: qty 32

## 2022-01-25 MED ORDER — PALONOSETRON HCL INJECTION 0.25 MG/5ML
0.2500 mg | Freq: Once | INTRAVENOUS | Status: AC
  Administered 2022-01-25: 0.25 mg via INTRAVENOUS
  Filled 2022-01-25: qty 5

## 2022-01-25 MED ORDER — SODIUM CHLORIDE 0.9 % IV SOLN
1920.0000 mg/m2 | INTRAVENOUS | Status: DC
  Administered 2022-01-25: 4750 mg via INTRAVENOUS
  Filled 2022-01-25: qty 95

## 2022-01-25 MED ORDER — SODIUM CHLORIDE 0.9 % IV SOLN
150.0000 mg | Freq: Once | INTRAVENOUS | Status: AC
  Administered 2022-01-25: 150 mg via INTRAVENOUS
  Filled 2022-01-25: qty 150

## 2022-01-25 MED ORDER — DEXTROSE 5 % IV SOLN: Freq: Once | INTRAVENOUS | Status: AC

## 2022-01-25 MED ORDER — SODIUM CHLORIDE 0.9 % IV SOLN
10.0000 mg | Freq: Once | INTRAVENOUS | Status: AC
  Administered 2022-01-25: 10 mg via INTRAVENOUS
  Filled 2022-01-25: qty 10

## 2022-01-25 MED ORDER — LEUCOVORIN CALCIUM INJECTION 350 MG
300.0000 mg/m2 | Freq: Once | INTRAVENOUS | Status: AC
  Administered 2022-01-25: 742 mg via INTRAVENOUS
  Filled 2022-01-25: qty 37.1
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
## 2022-01-25 NOTE — Progress Notes (Signed)
Patient seen by Dr. Sherrill today ? ?Vitals are within treatment parameters. ? ?Labs reviewed by Dr. Sherrill and are within treatment parameters. ? ?Per physician team, patient is ready for treatment and there are NO modifications to the treatment plan.  ?
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
## 2022-01-25 NOTE — Patient Instructions (Addendum)
[place]  The chemotherapy medication bag should finish at 46 hours, 96 hours, or 7 days. For example, if your pump is scheduled for 46 hours and it was put on at 4:00 p.m., it should finish at 2:00 p.m. the day it is scheduled to come off regardless of your appointment time.     Estimated time to finish at 12:45 Friday, January 27, 2022.   If the display on your pump reads "Low Volume" and it is beeping, take the batteries out of the pump and come to the cancer center for it to be taken off.   If the pump alarms go off prior to the pump reading "Low Volume" then call [phone] and someone can assist you.  If the plunger comes out and the chemotherapy medication is leaking out, please use your home chemo spill kit to clean up the spill. Do NOT use paper towels or other household products.  If you have problems or questions regarding your pump, please call either [phone] (24 hours a day) or the cancer center Monday-Friday 8:00 a.m.- 4:30 p.m. at the clinic number and we will assist you. If you are unable to get assistance, then go to the nearest Emergency Department and ask the staff to contact the IV team for assistance.   Discharge Instructions: Thank you for choosing [facility] to provide your oncology and hematology care.   If you have a lab appointment with the [facility], please go directly to the [facility] and check in at the registration area.   Wear comfortable clothing and clothing appropriate for easy access to any Portacath or PICC line.   We strive to give you quality time with your provider. You may need to reschedule your appointment if you arrive late (15 or more minutes).  Arriving late affects you and other patients whose appointments are after yours.  Also, if you miss three or more appointments without notifying the office, you may be dismissed from the clinic at the provider's discretion.      For prescription  refill requests, have your pharmacy contact our office and allow 72 hours for refills to be completed.    Today you received the following chemotherapy and/or immunotherapy agents Oxaliplatin, Leucovorin, Fluorouracil.      To help prevent nausea and vomiting after your treatment, we encourage you to take your nausea medication as directed.  BELOW ARE SYMPTOMS THAT SHOULD BE REPORTED IMMEDIATELY: *FEVER GREATER THAN 100.4 F (38 C) OR HIGHER *CHILLS OR SWEATING *NAUSEA AND VOMITING THAT IS NOT CONTROLLED WITH YOUR NAUSEA MEDICATION *UNUSUAL SHORTNESS OF BREATH *UNUSUAL BRUISING OR BLEEDING *URINARY PROBLEMS (pain or burning when urinating, or frequent urination) *BOWEL PROBLEMS (unusual diarrhea, constipation, pain near the anus) TENDERNESS IN MOUTH AND THROAT WITH OR WITHOUT PRESENCE OF ULCERS (sore throat, sores in mouth, or a toothache) UNUSUAL RASH, SWELLING OR PAIN  UNUSUAL VAGINAL DISCHARGE OR ITCHING   Items with * indicate a potential emergency and should be followed up as soon as possible or go to the Emergency Department if any problems should occur.  Please show the CHEMOTHERAPY ALERT CARD or IMMUNOTHERAPY ALERT CARD at check-in to the Emergency Department and triage nurse.  Should you have questions after your visit or need to cancel or reschedule your appointment, please contact [facility]  Dept: [phone]  and follow the prompts.  Office hours are 8:00 a.m. to 4:30 p.m. Monday - Friday. Please note that voicemails  left after 4:00 p.m. may not be returned until the following business day.  We are closed weekends and major holidays. You have access to a nurse at all times for urgent questions. Please call the main number to the clinic Dept: [phone] and follow the prompts.   For any non-urgent questions, you may also contact your provider using MyChart. We now offer e-Visits for anyone 22 and older to request care online for non-urgent  symptoms. For details visit [email or web address].   Also download the MyChart app! Go to the app store, search "MyChart", open the app, select [facility], and log in with your MyChart username and password.  Masks are optional in the cancer centers. If you would like for your care team to wear a mask while they are taking care of you, please let them know. You may have one support person who is at least 62 years old accompany you for your appointments.  Oxaliplatin Injection What is this medication? OXALIPLATIN (ox AL i PLA tin) is a chemotherapy drug. It targets fast dividing cells, like cancer cells, and causes these cells to die. This medicine is used to treat cancers of the colon and rectum, and many other cancers. This medicine may be used for other purposes; ask your health care provider or pharmacist if you have questions. COMMON BRAND NAME(S): Eloxatin What should I tell my care team before I take this medication? They need to know if you have any of these conditions: heart disease history of irregular heartbeat liver disease low blood counts, like white cells, platelets, or red blood cells lung or breathing disease, like asthma take medicines that treat or prevent blood clots tingling of the fingers or toes, or other nerve disorder an unusual or allergic reaction to oxaliplatin, other chemotherapy, other medicines, foods, dyes, or preservatives pregnant or trying to get pregnant breast-feeding How should I use this medication? This drug is given as an infusion into a vein. It is administered in a hospital or clinic by a specially trained health care professional. Talk to your pediatrician regarding the use of this medicine in children. Special care may be needed. Overdosage: If you think you have taken too much of this medicine contact a poison control center or emergency room at once. NOTE: This medicine is only for you. Do not share this medicine with others. What if I miss  a dose? It is important not to miss a dose. Call your doctor or health care professional if you are unable to keep an appointment. What may interact with this medication? Do not take this medicine with any of the following medications: cisapride dronedarone pimozide thioridazine This medicine may also interact with the following medications: aspirin and aspirin-like medicines certain medicines that treat or prevent blood clots like warfarin, apixaban, dabigatran, and rivaroxaban cisplatin cyclosporine diuretics medicines for infection like acyclovir, adefovir, amphotericin B, bacitracin, cidofovir, foscarnet, ganciclovir, gentamicin, pentamidine, vancomycin NSAIDs, medicines for pain and inflammation, like ibuprofen or naproxen other medicines that prolong the QT interval (an abnormal heart rhythm) pamidronate zoledronic acid This list may not describe all possible interactions. Give your health care provider a list of all the medicines, herbs, non-prescription drugs, or dietary supplements you use. Also tell them if you smoke, drink alcohol, or use illegal drugs. Some items may interact with your medicine. What should I watch for while using this medication? Your condition will be monitored carefully while you are receiving this medicine. You may need blood work done while you are taking this  medicine. This medicine may make you feel generally unwell. This is not uncommon as chemotherapy can affect healthy cells as well as cancer cells. Report any side effects. Continue your course of treatment even though you feel ill unless your healthcare professional tells you to stop. This medicine can make you more sensitive to cold. Do not drink cold drinks or use ice. Cover exposed skin before coming in contact with cold temperatures or cold objects. When out in cold weather wear warm clothing and cover your mouth and nose to warm the air that goes into your lungs. Tell your doctor if you get  sensitive to the cold. Do not become pregnant while taking this medicine or for 9 months after stopping it. Women should inform their health care professional if they wish to become pregnant or think they might be pregnant. Men should not father a child while taking this medicine and for 6 months after stopping it. There is potential for serious side effects to an unborn child. Talk to your health care professional for more information. Do not breast-feed a child while taking this medicine or for 3 months after stopping it. This medicine has caused ovarian failure in some women. This medicine may make it more difficult to get pregnant. Talk to your health care professional if you are concerned about your fertility. This medicine has caused decreased sperm counts in some men. This may make it more difficult to father a child. Talk to your health care professional if you are concerned about your fertility. This medicine may increase your risk of getting an infection. Call your health care professional for advice if you get a fever, chills, or sore throat, or other symptoms of a cold or flu. Do not treat yourself. Try to avoid being around people who are sick. Avoid taking medicines that contain aspirin, acetaminophen, ibuprofen, naproxen, or ketoprofen unless instructed by your health care professional. These medicines may hide a fever. Be careful brushing or flossing your teeth or using a toothpick because you may get an infection or bleed more easily. If you have any dental work done, tell your dentist you are receiving this medicine. What side effects may I notice from receiving this medication? Side effects that you should report to your doctor or health care professional as soon as possible: allergic reactions like skin rash, itching or hives, swelling of the face, lips, or tongue breathing problems cough low blood counts - this medicine may decrease the number of white blood cells, red blood cells,  and platelets. You may be at increased risk for infections and bleeding nausea, vomiting pain, redness, or irritation at site where injected pain, tingling, numbness in the hands or feet signs and symptoms of bleeding such as bloody or black, tarry stools; red or dark brown urine; spitting up blood or brown material that looks like coffee grounds; red spots on the skin; unusual bruising or bleeding from the eyes, gums, or nose signs and symptoms of a dangerous change in heartbeat or heart rhythm like chest pain; dizziness; fast, irregular heartbeat; palpitations; feeling faint or lightheaded; falls signs and symptoms of infection like fever; chills; cough; sore throat; pain or trouble passing urine signs and symptoms of liver injury like dark yellow or brown urine; general ill feeling or flu-like symptoms; light-colored stools; loss of appetite; nausea; right upper belly pain; unusually weak or tired; yellowing of the eyes or skin signs and symptoms of low red blood cells or anemia such as unusually weak or tired;  feeling faint or lightheaded; falls signs and symptoms of muscle injury like dark urine; trouble passing urine or change in the amount of urine; unusually weak or tired; muscle pain; back pain Side effects that usually do not require medical attention (report to your doctor or health care professional if they continue or are bothersome): changes in taste diarrhea gas hair loss loss of appetite mouth sores This list may not describe all possible side effects. Call your doctor for medical advice about side effects. You may report side effects to FDA at 1-800-FDA-1088. Where should I keep my medication? This drug is given in a hospital or clinic and will not be stored at home. NOTE: This sheet is a summary. It may not cover all possible information. If you have questions about this medicine, talk to your doctor, pharmacist, or health care provider.  2023 Elsevier/Gold Standard  (2021-05-13 00:00:00)  Leucovorin injection What is this medication? LEUCOVORIN (loo koe VOR in) is used to prevent or treat the harmful effects of some medicines. This medicine is used to treat anemia caused by a low amount of folic acid in the body. It is also used with 5-fluorouracil (5-FU) to treat colon cancer. This medicine may be used for other purposes; ask your health care provider or pharmacist if you have questions. What should I tell my care team before I take this medication? They need to know if you have any of these conditions: anemia from low levels of vitamin B-12 in the blood an unusual or allergic reaction to leucovorin, folic acid, other medicines, foods, dyes, or preservatives pregnant or trying to get pregnant breast-feeding How should I use this medication? This medicine is for injection into a muscle or into a vein. It is given by a health care professional in a hospital or clinic setting. Talk to your pediatrician regarding the use of this medicine in children. Special care may be needed. Overdosage: If you think you have taken too much of this medicine contact a poison control center or emergency room at once. NOTE: This medicine is only for you. Do not share this medicine with others. What if I miss a dose? This does not apply. What may interact with this medication? capecitabine fluorouracil phenobarbital phenytoin primidone trimethoprim-sulfamethoxazole This list may not describe all possible interactions. Give your health care provider a list of all the medicines, herbs, non-prescription drugs, or dietary supplements you use. Also tell them if you smoke, drink alcohol, or use illegal drugs. Some items may interact with your medicine. What should I watch for while using this medication? Your condition will be monitored carefully while you are receiving this medicine. This medicine may increase the side effects of 5-fluorouracil, 5-FU. Tell your doctor or  health care professional if you have diarrhea or mouth sores that do not get better or that get worse. What side effects may I notice from receiving this medication? Side effects that you should report to your doctor or health care professional as soon as possible: allergic reactions like skin rash, itching or hives, swelling of the face, lips, or tongue breathing problems fever, infection mouth sores unusual bleeding or bruising unusually weak or tired Side effects that usually do not require medical attention (report to your doctor or health care professional if they continue or are bothersome): constipation or diarrhea loss of appetite nausea, vomiting This list may not describe all possible side effects. Call your doctor for medical advice about side effects. You may report side effects to FDA at  1-800-FDA-1088. Where should I keep my medication? This drug is given in a hospital or clinic and will not be stored at home. NOTE: This sheet is a summary. It may not cover all possible information. If you have questions about this medicine, talk to your doctor, pharmacist, or health care provider.  2023 Elsevier/Gold Standard (2007-12-19 00:00:00)  Fluorouracil, 5-FU injection What is this medication? FLUOROURACIL, 5-FU (flure oh YOOR a sil) is a chemotherapy drug. It slows the growth of cancer cells. This medicine is used to treat many types of cancer like breast cancer, colon or rectal cancer, pancreatic cancer, and stomach cancer. This medicine may be used for other purposes; ask your health care provider or pharmacist if you have questions. COMMON BRAND NAME(S): Adrucil What should I tell my care team before I take this medication? They need to know if you have any of these conditions: blood disorders dihydropyrimidine dehydrogenase (DPD) deficiency infection (especially a virus infection such as chickenpox, cold sores, or herpes) kidney disease liver disease malnourished, poor  nutrition recent or ongoing radiation therapy an unusual or allergic reaction to fluorouracil, other chemotherapy, other medicines, foods, dyes, or preservatives pregnant or trying to get pregnant breast-feeding How should I use this medication? This drug is given as an infusion or injection into a vein. It is administered in a hospital or clinic by a specially trained health care professional. Talk to your pediatrician regarding the use of this medicine in children. Special care may be needed. Overdosage: If you think you have taken too much of this medicine contact a poison control center or emergency room at once. NOTE: This medicine is only for you. Do not share this medicine with others. What if I miss a dose? It is important not to miss your dose. Call your doctor or health care professional if you are unable to keep an appointment. What may interact with this medication? Do not take this medicine with any of the following medications: live virus vaccines This medicine may also interact with the following medications: medicines that treat or prevent blood clots like warfarin, enoxaparin, and dalteparin This list may not describe all possible interactions. Give your health care provider a list of all the medicines, herbs, non-prescription drugs, or dietary supplements you use. Also tell them if you smoke, drink alcohol, or use illegal drugs. Some items may interact with your medicine. What should I watch for while using this medication? Visit your doctor for checks on your progress. This drug may make you feel generally unwell. This is not uncommon, as chemotherapy can affect healthy cells as well as cancer cells. Report any side effects. Continue your course of treatment even though you feel ill unless your doctor tells you to stop. In some cases, you may be given additional medicines to help with side effects. Follow all directions for their use. Call your doctor or health care  professional for advice if you get a fever, chills or sore throat, or other symptoms of a cold or flu. Do not treat yourself. This drug decreases your body's ability to fight infections. Try to avoid being around people who are sick. This medicine may increase your risk to bruise or bleed. Call your doctor or health care professional if you notice any unusual bleeding. Be careful brushing and flossing your teeth or using a toothpick because you may get an infection or bleed more easily. If you have any dental work done, tell your dentist you are receiving this medicine. Avoid taking products that  contain aspirin, acetaminophen, ibuprofen, naproxen, or ketoprofen unless instructed by your doctor. These medicines may hide a fever. Do not become pregnant while taking this medicine. Women should inform their doctor if they wish to become pregnant or think they might be pregnant. There is a potential for serious side effects to an unborn child. Talk to your health care professional or pharmacist for more information. Do not breast-feed an infant while taking this medicine. Men should inform their doctor if they wish to father a child. This medicine may lower sperm counts. Do not treat diarrhea with over the counter products. Contact your doctor if you have diarrhea that lasts more than 2 days or if it is severe and watery. This medicine can make you more sensitive to the sun. Keep out of the sun. If you cannot avoid being in the sun, wear protective clothing and use sunscreen. Do not use sun lamps or tanning beds/booths. What side effects may I notice from receiving this medication? Side effects that you should report to your doctor or health care professional as soon as possible: allergic reactions like skin rash, itching or hives, swelling of the face, lips, or tongue low blood counts - this medicine may decrease the number of white blood cells, red blood cells and platelets. You may be at increased risk for  infections and bleeding. signs of infection - fever or chills, cough, sore throat, pain or difficulty passing urine signs of decreased platelets or bleeding - bruising, pinpoint red spots on the skin, black, tarry stools, blood in the urine signs of decreased red blood cells - unusually weak or tired, fainting spells, lightheadedness breathing problems changes in vision chest pain mouth sores nausea and vomiting pain, swelling, redness at site where injected pain, tingling, numbness in the hands or feet redness, swelling, or sores on hands or feet stomach pain unusual bleeding Side effects that usually do not require medical attention (report to your doctor or health care professional if they continue or are bothersome): changes in finger or toe nails diarrhea dry or itchy skin hair loss headache loss of appetite sensitivity of eyes to the light stomach upset unusually teary eyes This list may not describe all possible side effects. Call your doctor for medical advice about side effects. You may report side effects to FDA at 1-800-FDA-1088. Where should I keep my medication? This drug is given in a hospital or clinic and will not be stored at home. NOTE: This sheet is a summary. It may not cover all possible information. If you have questions about this medicine, talk to your doctor, pharmacist, or health care provider.  2023 Elsevier/Gold Standard (2021-05-13 00:00:00)

## 2022-01-26 ENCOUNTER — Other Ambulatory Visit: Payer: Self-pay

## 2022-01-27 ENCOUNTER — Inpatient Hospital Stay: Payer: Commercial Managed Care - PPO

## 2022-01-27 VITALS — BP 131/82 | HR 92 | Temp 97.9°F | Resp 18

## 2022-01-27 DIAGNOSIS — Z5111 Encounter for antineoplastic chemotherapy: Secondary | ICD-10-CM | POA: Diagnosis not present

## 2022-01-27 DIAGNOSIS — C20 Malignant neoplasm of rectum: Secondary | ICD-10-CM

## 2022-01-27 MED ORDER — SODIUM CHLORIDE 0.9% FLUSH
10.0000 mL | INTRAVENOUS | Status: DC | PRN
  Administered 2022-01-27: 10 mL

## 2022-01-27 MED ORDER — HEPARIN SOD (PORK) LOCK FLUSH 100 UNIT/ML IV SOLN
500.0000 [IU] | Freq: Once | INTRAVENOUS | Status: AC | PRN
  Administered 2022-01-27: 500 [IU]

## 2022-01-27 NOTE — Progress Notes (Signed)
Pt presents to the infusion room to have 5FU pump removed. All tape and clamps had been removed. Pt reports removing it at home himself. This RN advised pt to never do that in the future. RN reminded pt about hazardous drug handing procedures.

## 2022-01-27 NOTE — Patient Instructions (Signed)

## 2022-01-29 ENCOUNTER — Encounter: Payer: Self-pay | Admitting: Oncology

## 2022-01-30 ENCOUNTER — Telehealth: Payer: Self-pay

## 2022-01-30 NOTE — Telephone Encounter (Signed)
TC from Pt stating he had severe nausea and vomiting over the weekend. Pt states he has vomited once. Pt states he feels nausea more when he feels hungry. Pt states he is able to eat and drink and has no diarrhea. Pt was inquiring if he could get something else for nausea. Discussed with Dr. Benay Spice who stated he can give Decadron 4 mg for 2 days but Pt would have to watch his blood sugar. Informed Pt this can be sent in. He stated he will wait to see if the symptoms get worse but for now will want that on stand by for his next treatment.

## 2022-02-03 ENCOUNTER — Other Ambulatory Visit: Payer: Self-pay | Admitting: Oncology

## 2022-02-05 ENCOUNTER — Other Ambulatory Visit: Payer: Self-pay | Admitting: Oncology

## 2022-02-08 ENCOUNTER — Inpatient Hospital Stay: Payer: Commercial Managed Care - PPO

## 2022-02-08 ENCOUNTER — Other Ambulatory Visit: Payer: Self-pay

## 2022-02-08 ENCOUNTER — Inpatient Hospital Stay (HOSPITAL_BASED_OUTPATIENT_CLINIC_OR_DEPARTMENT_OTHER): Payer: Commercial Managed Care - PPO | Admitting: Oncology

## 2022-02-08 VITALS — BP 132/88 | HR 100 | Temp 98.1°F | Resp 18 | Ht 72.0 in | Wt 264.2 lb

## 2022-02-08 VITALS — BP 135/85 | HR 84

## 2022-02-08 DIAGNOSIS — Z5111 Encounter for antineoplastic chemotherapy: Secondary | ICD-10-CM | POA: Diagnosis not present

## 2022-02-08 DIAGNOSIS — C20 Malignant neoplasm of rectum: Secondary | ICD-10-CM

## 2022-02-08 LAB — CMP (CANCER CENTER ONLY)
ALT: 28 U/L (ref 0–44)
AST: 19 U/L (ref 15–41)
Albumin: 4.2 g/dL (ref 3.5–5.0)
Alkaline Phosphatase: 77 U/L (ref 38–126)
Anion gap: 10 (ref 5–15)
BUN: 15 mg/dL (ref 8–23)
CO2: 21 mmol/L — ABNORMAL LOW (ref 22–32)
Calcium: 8.8 mg/dL — ABNORMAL LOW (ref 8.9–10.3)
Chloride: 105 mmol/L (ref 98–111)
Creatinine: 0.93 mg/dL (ref 0.61–1.24)
GFR, Estimated: >60 mL/min (ref >60)
Glucose, Bld: 250 mg/dL — ABNORMAL HIGH (ref 70–99)
Potassium: 4.1 mmol/L (ref 3.5–5.1)
Sodium: 136 mmol/L (ref 135–145)
Total Bilirubin: 0.6 mg/dL (ref 0.3–1.2)
Total Protein: 6.9 g/dL (ref 6.5–8.1)

## 2022-02-08 LAB — CBC WITH DIFFERENTIAL (CANCER CENTER ONLY)
Abs Immature Granulocytes: 0.02 10*3/uL (ref 0.00–0.07)
Basophils Absolute: 0.1 10*3/uL (ref 0.0–0.1)
Basophils Relative: 2 %
Eosinophils Absolute: 0.3 10*3/uL (ref 0.0–0.5)
Eosinophils Relative: 7 %
HCT: 44.9 % (ref 39.0–52.0)
Hemoglobin: 15.5 g/dL (ref 13.0–17.0)
Immature Granulocytes: 0 %
Lymphocytes Relative: 21 %
Lymphs Abs: 1 10*3/uL (ref 0.7–4.0)
MCH: 29.6 pg (ref 26.0–34.0)
MCHC: 34.5 g/dL (ref 30.0–36.0)
MCV: 85.7 fL (ref 80.0–100.0)
Monocytes Absolute: 0.5 10*3/uL (ref 0.1–1.0)
Monocytes Relative: 10 %
Neutro Abs: 2.9 10*3/uL (ref 1.7–7.7)
Neutrophils Relative %: 60 %
Platelet Count: 137 10*3/uL — ABNORMAL LOW (ref 150–400)
RBC: 5.24 MIL/uL (ref 4.22–5.81)
RDW: 16.2 % — ABNORMAL HIGH (ref 11.5–15.5)
WBC Count: 4.7 10*3/uL (ref 4.0–10.5)
nRBC: 0 % (ref 0.0–0.2)

## 2022-02-08 LAB — CEA (ACCESS): CEA (CHCC): 2.33 ng/mL (ref 0.00–5.00)

## 2022-02-08 MED ORDER — SODIUM CHLORIDE 0.9 % IV SOLN
10.0000 mg | Freq: Once | INTRAVENOUS | Status: AC
  Administered 2022-02-08: 10 mg via INTRAVENOUS
  Filled 2022-02-08: qty 10

## 2022-02-08 MED ORDER — LEUCOVORIN CALCIUM INJECTION 350 MG
300.0000 mg/m2 | Freq: Once | INTRAVENOUS | Status: AC
  Administered 2022-02-08: 742 mg via INTRAVENOUS
  Filled 2022-02-08: qty 37.1

## 2022-02-08 MED ORDER — FLUOROURACIL CHEMO INJECTION 2.5 GM/50ML
300.0000 mg/m2 | Freq: Once | INTRAVENOUS | Status: AC
  Administered 2022-02-08: 750 mg via INTRAVENOUS
  Filled 2022-02-08: qty 15

## 2022-02-08 MED ORDER — SODIUM CHLORIDE 0.9 % IV SOLN
150.0000 mg | Freq: Once | INTRAVENOUS | Status: AC
  Administered 2022-02-08: 150 mg via INTRAVENOUS
  Filled 2022-02-08: qty 5

## 2022-02-08 MED ORDER — OXALIPLATIN CHEMO INJECTION 100 MG/20ML
65.0000 mg/m2 | Freq: Once | INTRAVENOUS | Status: AC
  Administered 2022-02-08: 160 mg via INTRAVENOUS
  Filled 2022-02-08: qty 32

## 2022-02-08 MED ORDER — PROCHLORPERAZINE MALEATE 10 MG PO TABS: 10.0000 mg | ORAL_TABLET | Freq: Four times a day (QID) | ORAL | 3 refills | Status: DC | PRN

## 2022-02-08 MED ORDER — SODIUM CHLORIDE 0.9 % IV SOLN
1920.0000 mg/m2 | INTRAVENOUS | Status: DC
  Administered 2022-02-08: 4750 mg via INTRAVENOUS
  Filled 2022-02-08: qty 95

## 2022-02-08 MED ORDER — DEXTROSE 5 % IV SOLN: Freq: Once | INTRAVENOUS | Status: AC

## 2022-02-08 MED ORDER — PALONOSETRON HCL INJECTION 0.25 MG/5ML
0.2500 mg | Freq: Once | INTRAVENOUS | Status: AC
  Administered 2022-02-08: 0.25 mg via INTRAVENOUS

## 2022-02-08 NOTE — Patient Instructions (Signed)
[facility]   Discharge Instructions: Thank you for choosing [facility] to provide your oncology and hematology care.   If you have a lab appointment with the [facility], please go directly to the [facility] and check in at the registration area.   Wear comfortable clothing and clothing appropriate for easy access to any Portacath or PICC line.   We strive to give you quality time with your provider. You may need to reschedule your appointment if you arrive late (15 or more minutes).  Arriving late affects you and other patients whose appointments are after yours.  Also, if you miss three or more appointments without notifying the office, you may be dismissed from the clinic at the provider's discretion.      For prescription refill requests, have your pharmacy contact our office and allow 72 hours for refills to be completed.    Today you received the following chemotherapy and/or immunotherapy agents Oxaliplatin (ELOXATIN), Leucovorin & Flourouracil (ADRUCIL).      To help prevent nausea and vomiting after your treatment, we encourage you to take your nausea medication as directed.  BELOW ARE SYMPTOMS THAT SHOULD BE REPORTED IMMEDIATELY: *FEVER GREATER THAN 100.4 F (38 C) OR HIGHER *CHILLS OR SWEATING *NAUSEA AND VOMITING THAT IS NOT CONTROLLED WITH YOUR NAUSEA MEDICATION *UNUSUAL SHORTNESS OF BREATH *UNUSUAL BRUISING OR BLEEDING *URINARY PROBLEMS (pain or burning when urinating, or frequent urination) *BOWEL PROBLEMS (unusual diarrhea, constipation, pain near the anus) TENDERNESS IN MOUTH AND THROAT WITH OR WITHOUT PRESENCE OF ULCERS (sore throat, sores in mouth, or a toothache) UNUSUAL RASH, SWELLING OR PAIN  UNUSUAL VAGINAL DISCHARGE OR ITCHING   Items with * indicate a potential emergency and should be followed up as soon as possible or go to the Emergency Department if any problems should occur.  Please show the CHEMOTHERAPY ALERT  CARD or IMMUNOTHERAPY ALERT CARD at check-in to the Emergency Department and triage nurse.  Should you have questions after your visit or need to cancel or reschedule your appointment, please contact [facility]  Dept: [phone]  and follow the prompts.  Office hours are 8:00 a.m. to 4:30 p.m. Monday - Friday. Please note that voicemails left after 4:00 p.m. may not be returned until the following business day.  We are closed weekends and major holidays. You have access to a nurse at all times for urgent questions. Please call the main number to the clinic Dept: [phone] and follow the prompts.   For any non-urgent questions, you may also contact your provider using MyChart. We now offer e-Visits for anyone 68 and older to request care online for non-urgent symptoms. For details visit [email or web address].   Also download the MyChart app! Go to the app store, search "MyChart", open the app, select , and log in with your MyChart username and password.  Masks are optional in the cancer centers. If you would like for your care team to wear a mask while they are taking care of you, please let them know. You may have one support person who is at least 62 years old accompany you for your appointments.  Oxaliplatin Injection What is this medication? OXALIPLATIN (ox AL i PLA tin) treats some types of cancer. It works by slowing down the growth of cancer cells. This medicine may be used for other purposes; ask your health care provider or pharmacist if you have questions. COMMON BRAND NAME(S): Eloxatin What should I tell my  care team before I take this medication? They need to know if you have any of these conditions: Heart disease History of irregular heartbeat or rhythm Liver disease Low blood cell levels (white cells, red cells, and platelets) Lung or breathing disease, such as asthma Take medications that treat or prevent blood clots Tingling of the  fingers, toes, or other nerve disorder An unusual or allergic reaction to oxaliplatin, other medications, foods, dyes, or preservatives If you or your partner are pregnant or trying to get pregnant Breast-feeding How should I use this medication? This medication is injected into a vein. It is given by your care team in a hospital or clinic setting. Talk to your care team about the use of this medication in children. Special care may be needed. Overdosage: If you think you have taken too much of this medicine contact a poison control center or emergency room at once. NOTE: This medicine is only for you. Do not share this medicine with others. What if I miss a dose? Keep appointments for follow-up doses. It is important not to miss a dose. Call your care team if you are unable to keep an appointment. What may interact with this medication? Do not take this medication with any of the following: Cisapride Dronedarone Pimozide Thioridazine This medication may also interact with the following: Aspirin and aspirin-like medications Certain medications that treat or prevent blood clots, such as warfarin, apixaban, dabigatran, and rivaroxaban Cisplatin Cyclosporine Diuretics Medications for infection, such as acyclovir, adefovir, amphotericin B, bacitracin, cidofovir, foscarnet, ganciclovir, gentamicin, pentamidine, vancomycin NSAIDs, medications for pain and inflammation, such as ibuprofen or naproxen Other medications that cause heart rhythm changes Pamidronate Zoledronic acid This list may not describe all possible interactions. Give your health care provider a list of all the medicines, herbs, non-prescription drugs, or dietary supplements you use. Also tell them if you smoke, drink alcohol, or use illegal drugs. Some items may interact with your medicine. What should I watch for while using this medication? Your condition will be monitored carefully while you are receiving this  medication. You may need blood work while taking this medication. This medication may make you feel generally unwell. This is not uncommon as chemotherapy can affect healthy cells as well as cancer cells. Report any side effects. Continue your course of treatment even though you feel ill unless your care team tells you to stop. This medication may increase your risk of getting an infection. Call your care team for advice if you get a fever, chills, sore throat, or other symptoms of a cold or flu. Do not treat yourself. Try to avoid being around people who are sick. Avoid taking medications that contain aspirin, acetaminophen, ibuprofen, naproxen, or ketoprofen unless instructed by your care team. These medications may hide a fever. Be careful brushing or flossing your teeth or using a toothpick because you may get an infection or bleed more easily. If you have any dental work done, tell your dentist you are receiving this medication. This medication can make you more sensitive to cold. Do not drink cold drinks or use ice. Cover exposed skin before coming in contact with cold temperatures or cold objects. When out in cold weather wear warm clothing and cover your mouth and nose to warm the air that goes into your lungs. Tell your care team if you get sensitive to the cold. Talk to your care team if you or your partner are pregnant or think either of you might be pregnant. This medication can  cause serious birth defects if taken during pregnancy and for 9 months after the last dose. A negative pregnancy test is required before starting this medication. A reliable form of contraception is recommended while taking this medication and for 9 months after the last dose. Talk to your care team about effective forms of contraception. Do not father a child while taking this medication and for 6 months after the last dose. Use a condom while having sex during this time period. Do not breastfeed while taking this  medication and for 3 months after the last dose. This medication may cause infertility. Talk to your care team if you are concerned about your fertility. What side effects may I notice from receiving this medication? Side effects that you should report to your care team as soon as possible: Allergic reactions--skin rash, itching, hives, swelling of the face, lips, tongue, or throat Bleeding--bloody or black, tar-like stools, vomiting blood or brown material that looks like coffee grounds, red or dark brown urine, small red or purple spots on skin, unusual bruising or bleeding Dry cough, shortness of breath or trouble breathing Heart rhythm changes--fast or irregular heartbeat, dizziness, feeling faint or lightheaded, chest pain, trouble breathing Infection--fever, chills, cough, sore throat, wounds that don't heal, pain or trouble when passing urine, general feeling of discomfort or being unwell Liver injury--right upper belly pain, loss of appetite, nausea, light-colored stool, dark yellow or brown urine, yellowing skin or eyes, unusual weakness or fatigue Low red blood cell level--unusual weakness or fatigue, dizziness, headache, trouble breathing Muscle injury--unusual weakness or fatigue, muscle pain, dark yellow or brown urine, decrease in amount of urine Pain, tingling, or numbness in the hands or feet Sudden and severe headache, confusion, change in vision, seizures, which may be signs of posterior reversible encephalopathy syndrome (PRES) Unusual bruising or bleeding Side effects that usually do not require medical attention (report to your care team if they continue or are bothersome): Diarrhea Nausea Pain, redness, or swelling with sores inside the mouth or throat Unusual weakness or fatigue Vomiting This list may not describe all possible side effects. Call your doctor for medical advice about side effects. You may report side effects to FDA at 1-800-FDA-1088. Where should I keep my  medication? This medication is given in a hospital or clinic. It will not be stored at home. NOTE: This sheet is a summary. It may not cover all possible information. If you have questions about this medicine, talk to your doctor, pharmacist, or health care provider.  2023 Elsevier/Gold Standard (2021-10-07 00:00:00)  Leucovorin Injection What is this medication? LEUCOVORIN (loo koe VOR in) prevents side effects from certain medications, such as methotrexate. It works by increasing folate levels. This helps protect healthy cells in your body. It may also be used to treat anemia caused by low levels of folate. It can also be used with fluorouracil, a type of chemotherapy, to treat colorectal cancer. It works by increasing the effects of fluorouracil in the body. This medicine may be used for other purposes; ask your health care provider or pharmacist if you have questions. What should I tell my care team before I take this medication? They need to know if you have any of these conditions: Anemia from low levels of vitamin B12 in the blood An unusual or allergic reaction to leucovorin, folic acid, other medications, foods, dyes, or preservatives Pregnant or trying to get pregnant Breastfeeding How should I use this medication? This medication is injected into a vein or a  muscle. It is given by your care team in a hospital or clinic setting. Talk to your care team about the use of this medication in children. Special care may be needed. Overdosage: If you think you have taken too much of this medicine contact a poison control center or emergency room at once. NOTE: This medicine is only for you. Do not share this medicine with others. What if I miss a dose? Keep appointments for follow-up doses. It is important not to miss your dose. Call your care team if you are unable to keep an appointment. What may interact with this  medication? Capecitabine Fluorouracil Phenobarbital Phenytoin Primidone Trimethoprim;sulfamethoxazole This list may not describe all possible interactions. Give your health care provider a list of all the medicines, herbs, non-prescription drugs, or dietary supplements you use. Also tell them if you smoke, drink alcohol, or use illegal drugs. Some items may interact with your medicine. What should I watch for while using this medication? Your condition will be monitored carefully while you are receiving this medication. This medication may increase the side effects of 5-fluorouracil. Tell your care team if you have diarrhea or mouth sores that do not get better or that get worse. What side effects may I notice from receiving this medication? Side effects that you should report to your care team as soon as possible: Allergic reactions--skin rash, itching, hives, swelling of the face, lips, tongue, or throat This list may not describe all possible side effects. Call your doctor for medical advice about side effects. You may report side effects to FDA at 1-800-FDA-1088. Where should I keep my medication? This medication is given in a hospital or clinic. It will not be stored at home. NOTE: This sheet is a summary. It may not cover all possible information. If you have questions about this medicine, talk to your doctor, pharmacist, or health care provider.  2023 Elsevier/Gold Standard (2021-10-21 00:00:00)  Fluorouracil Injection What is this medication? FLUOROURACIL (flure oh YOOR a sil) treats some types of cancer. It works by slowing down the growth of cancer cells. This medicine may be used for other purposes; ask your health care provider or pharmacist if you have questions. COMMON BRAND NAME(S): Adrucil What should I tell my care team before I take this medication? They need to know if you have any of these conditions: Blood disorders Dihydropyrimidine dehydrogenase (DPD)  deficiency Infection, such as chickenpox, cold sores, herpes Kidney disease Liver disease Poor nutrition Recent or ongoing radiation therapy An unusual or allergic reaction to fluorouracil, other medications, foods, dyes, or preservatives If you or your partner are pregnant or trying to get pregnant Breast-feeding How should I use this medication? This medication is injected into a vein. It is administered by your care team in a hospital or clinic setting. Talk to your care team about the use of this medication in children. Special care may be needed. Overdosage: If you think you have taken too much of this medicine contact a poison control center or emergency room at once. NOTE: This medicine is only for you. Do not share this medicine with others. What if I miss a dose? Keep appointments for follow-up doses. It is important not to miss your dose. Call your care team if you are unable to keep an appointment. What may interact with this medication? Do not take this medication with any of the following: Live virus vaccines This medication may also interact with the following: Medications that treat or prevent blood clots, such  as warfarin, enoxaparin, dalteparin This list may not describe all possible interactions. Give your health care provider a list of all the medicines, herbs, non-prescription drugs, or dietary supplements you use. Also tell them if you smoke, drink alcohol, or use illegal drugs. Some items may interact with your medicine. What should I watch for while using this medication? Your condition will be monitored carefully while you are receiving this medication. This medication may make you feel generally unwell. This is not uncommon as chemotherapy can affect healthy cells as well as cancer cells. Report any side effects. Continue your course of treatment even though you feel ill unless your care team tells you to stop. In some cases, you may be given additional medications  to help with side effects. Follow all directions for their use. This medication may increase your risk of getting an infection. Call your care team for advice if you get a fever, chills, sore throat, or other symptoms of a cold or flu. Do not treat yourself. Try to avoid being around people who are sick. This medication may increase your risk to bruise or bleed. Call your care team if you notice any unusual bleeding. Be careful brushing or flossing your teeth or using a toothpick because you may get an infection or bleed more easily. If you have any dental work done, tell your dentist you are receiving this medication. Avoid taking medications that contain aspirin, acetaminophen, ibuprofen, naproxen, or ketoprofen unless instructed by your care team. These medications may hide a fever. Do not treat diarrhea with over the counter products. Contact your care team if you have diarrhea that lasts more than 2 days or if it is severe and watery. This medication can make you more sensitive to the sun. Keep out of the sun. If you cannot avoid being in the sun, wear protective clothing and sunscreen. Do not use sun lamps, tanning beds, or tanning booths. Talk to your care team if you or your partner wish to become pregnant or think you might be pregnant. This medication can cause serious birth defects if taken during pregnancy and for 3 months after the last dose. A reliable form of contraception is recommended while taking this medication and for 3 months after the last dose. Talk to your care team about effective forms of contraception. Do not father a child while taking this medication and for 3 months after the last dose. Use a condom while having sex during this time period. Do not breastfeed while taking this medication. This medication may cause infertility. Talk to your care team if you are concerned about your fertility. What side effects may I notice from receiving this medication? Side effects that you  should report to your care team as soon as possible: Allergic reactions--skin rash, itching, hives, swelling of the face, lips, tongue, or throat Heart attack--pain or tightness in the chest, shoulders, arms, or jaw, nausea, shortness of breath, cold or clammy skin, feeling faint or lightheaded Heart failure--shortness of breath, swelling of the ankles, feet, or hands, sudden weight gain, unusual weakness or fatigue Heart rhythm changes--fast or irregular heartbeat, dizziness, feeling faint or lightheaded, chest pain, trouble breathing High ammonia level--unusual weakness or fatigue, confusion, loss of appetite, nausea, vomiting, seizures Infection--fever, chills, cough, sore throat, wounds that don't heal, pain or trouble when passing urine, general feeling of discomfort or being unwell Low red blood cell level--unusual weakness or fatigue, dizziness, headache, trouble breathing Pain, tingling, or numbness in the hands or feet, muscle weakness,  change in vision, confusion or trouble speaking, loss of balance or coordination, trouble walking, seizures Redness, swelling, and blistering of the skin over hands and feet Severe or prolonged diarrhea Unusual bruising or bleeding Side effects that usually do not require medical attention (report to your care team if they continue or are bothersome): Dry skin Headache Increased tears Nausea Pain, redness, or swelling with sores inside the mouth or throat Sensitivity to light Vomiting This list may not describe all possible side effects. Call your doctor for medical advice about side effects. You may report side effects to FDA at 1-800-FDA-1088. Where should I keep my medication? This medication is given in a hospital or clinic. It will not be stored at home. NOTE: This sheet is a summary. It may not cover all possible information. If you have questions about this medicine, talk to your doctor, pharmacist, or health care provider.  2023 Elsevier/Gold  Standard (2021-10-18 00:00:00)  The chemotherapy medication bag should finish at 46 hours, 96 hours, or 7 days. For example, if your pump is scheduled for 46 hours and it was put on at 4:00 p.m., it should finish at 2:00 p.m. the day it is scheduled to come off regardless of your appointment time.     Estimated time to finish at 1:00 p.m. on Friday 02/10/2022.   If the display on your pump reads "Low Volume" and it is beeping, take the batteries out of the pump and come to the cancer center for it to be taken off.   If the pump alarms go off prior to the pump reading "Low Volume" then call [phone] and someone can assist you.  If the plunger comes out and the chemotherapy medication is leaking out, please use your home chemo spill kit to clean up the spill. Do NOT use paper towels or other household products.  If you have problems or questions regarding your pump, please call either [phone] (24 hours a day) or the cancer center Monday-Friday 8:00 a.m.- 4:30 p.m. at the clinic number and we will assist you. If you are unable to get assistance, then go to the nearest Emergency Department and ask the staff to contact the IV team for assistance.

## 2022-02-08 NOTE — Progress Notes (Signed)
Patient seen by Dr. Sherrill today ? ?Vitals are within treatment parameters. ? ?Labs reviewed by Dr. Sherrill and are within treatment parameters. ? ?Per physician team, patient is ready for treatment and there are NO modifications to the treatment plan.  ?
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
## 2022-02-09 ENCOUNTER — Encounter: Payer: Self-pay | Admitting: Oncology

## 2022-02-09 ENCOUNTER — Encounter: Payer: Self-pay | Admitting: Family

## 2022-02-09 ENCOUNTER — Ambulatory Visit: Payer: Commercial Managed Care - PPO | Admitting: Family

## 2022-02-09 VITALS — BP 137/83 | HR 107 | Temp 97.5°F | Ht 72.0 in | Wt 260.0 lb

## 2022-02-09 DIAGNOSIS — J4520 Mild intermittent asthma, uncomplicated: Secondary | ICD-10-CM | POA: Diagnosis not present

## 2022-02-09 DIAGNOSIS — M545 Low back pain, unspecified: Secondary | ICD-10-CM

## 2022-02-09 DIAGNOSIS — R3914 Feeling of incomplete bladder emptying: Secondary | ICD-10-CM

## 2022-02-09 DIAGNOSIS — C20 Malignant neoplasm of rectum: Secondary | ICD-10-CM

## 2022-02-09 DIAGNOSIS — E1169 Type 2 diabetes mellitus with other specified complication: Secondary | ICD-10-CM | POA: Diagnosis not present

## 2022-02-09 DIAGNOSIS — K219 Gastro-esophageal reflux disease without esophagitis: Secondary | ICD-10-CM

## 2022-02-09 DIAGNOSIS — Z800 Family history of malignant neoplasm of digestive organs: Secondary | ICD-10-CM

## 2022-02-09 DIAGNOSIS — E785 Hyperlipidemia, unspecified: Secondary | ICD-10-CM

## 2022-02-09 DIAGNOSIS — G8929 Other chronic pain: Secondary | ICD-10-CM

## 2022-02-09 DIAGNOSIS — N401 Enlarged prostate with lower urinary tract symptoms: Secondary | ICD-10-CM

## 2022-02-09 LAB — BAYER DCA HB A1C WAIVED: HB A1C (BAYER DCA - WAIVED): 7.9 % — ABNORMAL HIGH (ref 4.8–5.6)

## 2022-02-09 MED ORDER — TOUJEO SOLOSTAR 300 UNIT/ML ~~LOC~~ SOPN: 30.0000 [IU] | PEN_INJECTOR | Freq: Every evening | SUBCUTANEOUS | 2 refills | Status: DC

## 2022-02-09 MED ORDER — OMEPRAZOLE 40 MG PO CPDR: 40.0000 mg | DELAYED_RELEASE_CAPSULE | Freq: Every day | ORAL | 1 refills | Status: DC | PRN

## 2022-02-09 MED ORDER — INSULIN LISPRO (1 UNIT DIAL) 100 UNIT/ML (KWIKPEN): 8.0000 [IU] | PEN_INJECTOR | Freq: Three times a day (TID) | SUBCUTANEOUS | 1 refills | Status: DC

## 2022-02-09 NOTE — Patient Instructions (Signed)

## 2022-02-09 NOTE — Progress Notes (Signed)
Subjective:    Patient ID: [name] [name], male    DOB: 05-24-1961, 61 y.o.   MRN: [id number]  Chief Complaint  Patient presents with   Medical Management of Chronic Issues   Pt presents to the office today for chronic follow up. He had for Spinal Cord stimulator. He states this has been life changing. He was diagnosed Rectal Cancer and currently getting chemo. He is getting steroids which is causing his glucose to be elevated.   He is morbid obese with BMI of 35 and DM and Hyperlipidemia.  Asthma There is no cough, shortness of breath or wheezing. This is a chronic problem. The current episode started more than 1 year ago. The problem occurs intermittently. The problem has been waxing and waning. Associated symptoms include heartburn. His symptoms are alleviated by rest. He reports moderate improvement on treatment. His past medical history is significant for asthma.  Gastroesophageal Reflux He complains of belching and heartburn. He reports no coughing or no wheezing. This is a chronic problem. The problem occurs occasionally. Risk factors include obesity. He has tried a PPI for the symptoms. The treatment provided moderate relief.  Diabetes He presents for his follow-up diabetic visit. He has type 2 diabetes mellitus. Associated symptoms include foot paresthesias. Pertinent negatives for diabetes include no blurred vision. Symptoms are stable. Diabetic complications include peripheral neuropathy. Pertinent negatives for diabetic complications include no heart disease. Risk factors for coronary artery disease include dyslipidemia, diabetes mellitus, male sex, hypertension, post-menopausal and sedentary lifestyle. He is following a generally healthy diet. His overall blood glucose range is >200 mg/dl. Eye exam is current.  Hyperlipidemia This is a chronic problem. The current episode started more than 1 year ago. The problem is controlled. Exacerbating diseases include obesity. Pertinent  negatives include no shortness of breath. Current antihyperlipidemic treatment includes statins. The current treatment provides moderate improvement of lipids. Risk factors for coronary artery disease include dyslipidemia, diabetes mellitus, male sex, hypertension and a sedentary lifestyle.  Back Pain This is a chronic problem. The current episode started more than 1 year ago. The problem occurs intermittently. The problem has been waxing and waning since onset. The pain is present in the lumbar spine. The quality of the pain is described as aching. The pain is at a severity of 2/10. The pain is mild. Treatments tried: spinal cord stimulator. The treatment provided mild relief.      Review of Systems  Eyes:  Negative for blurred vision.  Respiratory:  Negative for cough, shortness of breath and wheezing.   Gastrointestinal:  Positive for heartburn.  Musculoskeletal:  Positive for back pain.  All other systems reviewed and are negative.      Objective:   Physical Exam Vitals reviewed.  Constitutional:      General: He is not in acute distress.    Appearance: He is well-developed. He is obese.  HENT:     Head: Normocephalic.     Right Ear: Tympanic membrane normal.     Left Ear: Tympanic membrane normal.  Eyes:     General:        Right eye: No discharge.        Left eye: No discharge.     Pupils: Pupils are equal, round, and reactive to light.  Neck:     Thyroid: No thyromegaly.  Cardiovascular:     Rate and Rhythm: Normal rate and regular rhythm.     Heart sounds: Normal heart sounds. No murmur heard. Pulmonary:  Effort: Pulmonary effort is normal. No respiratory distress.     Breath sounds: Normal breath sounds. No wheezing.  Abdominal:     General: Bowel sounds are normal. There is no distension.     Palpations: Abdomen is soft.     Tenderness: There is no abdominal tenderness.  Musculoskeletal:        General: No tenderness. Normal range of motion.     Cervical  back: Normal range of motion and neck supple.  Skin:    General: Skin is warm and dry.     Findings: No erythema or rash.  Neurological:     Mental Status: He is alert and oriented to person, place, and time.     Cranial Nerves: No cranial nerve deficit.     Deep Tendon Reflexes: Reflexes are normal and symmetric.  Psychiatric:        Behavior: Behavior normal.        Thought Content: Thought content normal.        Judgment: Judgment normal.      BP 137/83   Pulse (!) 107   Temp (!) 97.5 F (36.4 C) (Temporal)   Ht 6' (1.829 m)   Wt 260 lb (117.9 kg)   SpO2 96%   BMI 35.26 kg/m      Assessment & Plan:  Arty Baumgartner Capo comes in today with chief complaint of Medical Management of Chronic Issues   Diagnosis and orders addressed:  1. Rectal cancer ([place])  2. Gastroesophageal reflux disease, unspecified whether esophagitis present - omeprazole (PRILOSEC) 40 MG capsule; Take 1 capsule (40 mg total) by mouth daily as needed. For acid reflux  Dispense: 90 capsule; Refill: 1  3. Mild intermittent asthma, unspecified whether complicated  4. Type 2 diabetes mellitus with other specified complication, without long-term current use of insulin (HCC) Will increase Toujeo to 30 units from 25 units Will add Humalog as needed when given steroids from chemo - insulin glargine, 1 Unit Dial, (TOUJEO SOLOSTAR) 300 UNIT/ML Solostar Pen; Inject 30 Units into the skin at bedtime.  Dispense: 3 mL; Refill: 2 - insulin lispro (HUMALOG KWIKPEN) 100 UNIT/ML KwikPen; Inject 8 Units into the skin 3 (three) times daily.  Dispense: 6 mL; Refill: 1 - Bayer DCA Hb A1c Waived  5. Morbid obesity ([place])  6. Family history of colon cancer  7. Dyslipidemia  8. Chronic low back pain, unspecified back pain laterality, unspecified whether sciatica present  9. Benign prostatic hyperplasia with incomplete bladder emptying   Labs reviewed  Health Maintenance reviewed Diet and exercise  encouraged  Follow up plan: 3 months    Evelina Dun, FNP

## 2022-02-10 ENCOUNTER — Other Ambulatory Visit: Payer: Self-pay

## 2022-02-10 ENCOUNTER — Other Ambulatory Visit: Payer: Self-pay | Admitting: *Deleted

## 2022-02-10 ENCOUNTER — Inpatient Hospital Stay: Payer: Commercial Managed Care - PPO

## 2022-02-10 VITALS — BP 126/88 | HR 100 | Temp 97.9°F | Resp 18

## 2022-02-10 DIAGNOSIS — Z5111 Encounter for antineoplastic chemotherapy: Secondary | ICD-10-CM | POA: Diagnosis not present

## 2022-02-10 DIAGNOSIS — C20 Malignant neoplasm of rectum: Secondary | ICD-10-CM

## 2022-02-10 MED ORDER — DEXAMETHASONE 4 MG PO TABS: 4.0000 mg | ORAL_TABLET | Freq: Two times a day (BID) | ORAL | 0 refills | Status: DC

## 2022-02-10 MED ORDER — SODIUM CHLORIDE 0.9% FLUSH
10.0000 mL | INTRAVENOUS | Status: DC | PRN
  Administered 2022-02-10: 10 mL

## 2022-02-10 MED ORDER — HEPARIN SOD (PORK) LOCK FLUSH 100 UNIT/ML IV SOLN
500.0000 [IU] | Freq: Once | INTRAVENOUS | Status: AC | PRN
  Administered 2022-02-10: 500 [IU]

## 2022-02-10 NOTE — Patient Instructions (Signed)

## 2022-02-16 ENCOUNTER — Ambulatory Visit (INDEPENDENT_AMBULATORY_CARE_PROVIDER_SITE_OTHER): Payer: Commercial Managed Care - PPO | Admitting: Pharmacist

## 2022-02-16 DIAGNOSIS — N183 Chronic kidney disease, stage 3 unspecified: Secondary | ICD-10-CM

## 2022-02-16 DIAGNOSIS — E1122 Type 2 diabetes mellitus with diabetic chronic kidney disease: Secondary | ICD-10-CM | POA: Diagnosis not present

## 2022-02-16 MED ORDER — FREESTYLE LIBRE 3 SENSOR MISC: 11 refills | Status: DC

## 2022-02-16 NOTE — Progress Notes (Signed)
    02/16/2022 Name: [name] [name] MRN: [id number] DOB: 07-Dec-1960   S:  18 yoM Presents for diabetes evaluation, education, and management.  He is interested in CGM (specifically using the [place] 3 system)  Insurance coverage/medication affordability: [place]  Patient reports adherence with medications. Current diabetes medications include:            METFORMIN 295 BID; GFR 81 TRULICITY 1.'5MG'$  SQ WEEKLY JARDIANCE '25mg'$  daily TOUJEO 30 UNITS AT BEDTIME HUMALOG 8 UNITS 3 TIMES DAILY WITH MEALS Current hypertension medications include: N/A (ACEi was stopped at discharge in 02/2021) Goal 130/80 Current hyperlipidemia medications include: ROSUVASTATIN; LDL 77 --PUSH DOSE    Patient denies hypoglycemic events.   Patient reported dietary habits: Eats 2-3 meals/day  Patient-reported exercise habits: N/A  O:  Lab Results  Component Value Date   HGBA1C 7.9 (H) 02/09/2022   Lipid Panel     Component Value Date/Time   CHOL 134 08/11/2021 0836   TRIG 197 (H) 08/11/2021 0836   HDL 43 08/11/2021 0836   CHOLHDL 3.1 08/11/2021 0836   CHOLHDL 5 05/04/2016 0806   VLDL 50.6 (H) 05/04/2016 0806   LDLCALC 59 08/11/2021 0836   LDLDIRECT 118.0 05/04/2016 0806     Clinical Atherosclerotic Cardiovascular Disease (ASCVD): No   The 10-year ASCVD risk score (Arnett DK, et al., 2019) is: 14.6%   Values used to calculate the score:     Age: 17 years     Sex: Male     Is Non-Hispanic African American: No     Diabetic: Yes     Tobacco smoker: No     Systolic Blood Pressure: 284 mmHg     Is BP treated: No     HDL Cholesterol: 43 mg/dL     Total Cholesterol: 134 mg/dL    A/P:  Diabetes T2DM currently UNCONTROLLED. Patient is undergoing chemotherapy/steroids which is making glycemic control challenging.  Patient is adherent with medication.    -CONTINUE MEDICATIONS AS PRESCRIBED   METFORMIN 132 BID; GFR 81 TRULICITY 1.'5MG'$  SQ WEEKLY JARDIANCE '25mg'$  daily TOUJEO 30 UNITS AT  BEDTIME HUMALOG 8 UNITS 3 TIMES DAILY WITH MEALS    -CALLED IN LIB[employer] 3, but insurance denied with no reason (MUST USE SMART PHONE; no reader available with libre 3)              VOUCHER GIVEN TO PATIENT (HE KNOWS TO CALL [phone]) AND COST WILL BE RIGHT AT $75 MONTH FOR 2 SENSORS             LIBRE 3 DOES NOT REQUIRE PATIENT TO SCAN             BLOOD SUGAR UPDATES REAL TIME EVERY MINUTE             Must get at chain stores most likely for voucher to work--sent to [facility]     Written patient instructions provided.  Total time in face to face counseling 20 minutes.     Regina Eck, PharmD, BCPS Clinical Pharmacist, [place]  II Phone [phone]

## 2022-02-19 ENCOUNTER — Other Ambulatory Visit: Payer: Self-pay | Admitting: Oncology

## 2022-02-19 NOTE — Progress Notes (Signed)
OFF PATHWAY REGIMEN - Colorectal  No Change  Continue With Treatment as Ordered.  Original Decision Date/Time: 11/18/2021 13:38   [id number] (Leucovorin IV D1 + Fluorouracil IV D1/CIV D1,2 + Oxaliplatin IV D1) q14 Days:   A cycle is every 14 days:     Oxaliplatin      Leucovorin      Fluorouracil      Fluorouracil   **Always confirm dose/schedule in your pharmacy ordering system**  Patient Characteristics: Preoperative or Nonsurgical Candidate, M0 (Clinical Staging), Distant Metastasis Tumor Location: Rectal Therapeutic Status: Preoperative or Nonsurgical Candidate, M0 (Clinical Staging) AJCC T Category: cT4a AJCC N Category: cN2 AJCC M Category: cM1 AJCC 8 Stage Grouping: Unknown Intent of Therapy: Curative Intent, Discussed with Patient

## 2022-02-20 ENCOUNTER — Encounter (HOSPITAL_BASED_OUTPATIENT_CLINIC_OR_DEPARTMENT_OTHER): Payer: Self-pay

## 2022-02-20 ENCOUNTER — Inpatient Hospital Stay: Payer: Commercial Managed Care - PPO

## 2022-02-20 ENCOUNTER — Ambulatory Visit (HOSPITAL_BASED_OUTPATIENT_CLINIC_OR_DEPARTMENT_OTHER)
Admission: RE | Admit: 2022-02-20 | Discharge: 2022-02-20 | Disposition: A | Discharge status: Home / Self Care | Payer: Commercial Managed Care - PPO | Source: Ambulatory Visit | Attending: Oncology | Admitting: Oncology

## 2022-02-20 DIAGNOSIS — C20 Malignant neoplasm of rectum: Secondary | ICD-10-CM | POA: Insufficient documentation

## 2022-02-20 DIAGNOSIS — Z5111 Encounter for antineoplastic chemotherapy: Secondary | ICD-10-CM | POA: Diagnosis not present

## 2022-02-20 MED ORDER — IOHEXOL 300 MG/ML  SOLN
100.0000 mL | Freq: Once | INTRAMUSCULAR | Status: AC | PRN
  Administered 2022-02-20: 70 mL via INTRAVENOUS

## 2022-02-20 MED ORDER — HEPARIN SOD (PORK) LOCK FLUSH 100 UNIT/ML IV SOLN
500.0000 [IU] | Freq: Once | INTRAVENOUS | Status: AC
  Administered 2022-02-20: 500 [IU] via INTRAVENOUS

## 2022-02-22 ENCOUNTER — Telehealth: Payer: Self-pay | Admitting: Pharmacist

## 2022-02-22 ENCOUNTER — Inpatient Hospital Stay (HOSPITAL_BASED_OUTPATIENT_CLINIC_OR_DEPARTMENT_OTHER): Payer: Commercial Managed Care - PPO | Admitting: Oncology

## 2022-02-22 VITALS — BP 136/86 | HR 92 | Temp 97.9°F | Resp 18 | Ht 72.0 in | Wt 262.2 lb

## 2022-02-22 DIAGNOSIS — Z5111 Encounter for antineoplastic chemotherapy: Secondary | ICD-10-CM | POA: Diagnosis not present

## 2022-02-22 DIAGNOSIS — C20 Malignant neoplasm of rectum: Secondary | ICD-10-CM | POA: Diagnosis not present

## 2022-02-22 MED ORDER — LORAZEPAM 0.5 MG PO TABS: 0.5000 mg | ORAL_TABLET | Freq: Four times a day (QID) | ORAL | 0 refills | Status: DC | PRN

## 2022-02-22 MED ORDER — MAGIC MOUTHWASH: 5.0000 mL | Freq: Four times a day (QID) | ORAL | 1 refills | Status: DC | PRN

## 2022-02-22 NOTE — Telephone Encounter (Signed)
Please let patient know I tried my best to get his [employer] [place] 3 approved (his insurance req'd different website for [place]; see below Insurance denied and gave no reason I would have him call his insurance to see if CGM is even covered (would ask copay) If they prefer another CGM system, I'm happy to move forward  If not, he can call [phone] for $75 voucher for [employer]
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
## 2022-02-23 NOTE — Telephone Encounter (Signed)
Patient aware and verbalized understanding. °

## 2022-03-01 ENCOUNTER — Ambulatory Visit: Payer: Self-pay | Admitting: Surgery

## 2022-03-01 ENCOUNTER — Other Ambulatory Visit: Payer: Self-pay

## 2022-03-01 DIAGNOSIS — C20 Malignant neoplasm of rectum: Secondary | ICD-10-CM

## 2022-03-01 NOTE — H&P (Signed)
History of Present Illness: [name] [name] is a 61 y.o. male who is seen today for follow up of rectal cancer with metastatic disease to the liver. He has completed 6 cycles of neoadjuvant FOLFOX as of 8/16. He had restaging scans of the chest/abd/pelvis on 8/28 that showed no disease progression and no evidence of new metastatic disease. He is here today to discuss surgery. He has no new complaints today. He has overall tolerating chemotherapy well. He has had some fatigue but no loss of appetite. He denies abdominal pain.   His only prior abdominal surgery is an appendectomy in the 1980s.   Review of Systems: A complete review of systems was obtained from the patient.  I have reviewed this information and discussed as appropriate with the patient.  See HPI as well for other ROS.       Medical History: Past Medical History      Past Medical History:  Diagnosis Date   Diabetes mellitus without complication (CMS-HCC)     History of cancer             Patient Active Problem List  Diagnosis   Rectal cancer metastasized to liver (CMS-HCC)      Past Surgical History'[]'$ Expand by Default       Past Surgical History:  Procedure Laterality Date   APPENDECTOMY   1984   cervial spine surgery   01/2012        Allergies  No Known Allergies           Current Outpatient Medications on File Prior to Visit  Medication Sig Dispense Refill   dulaglutide (TRULICITY) 1.5 SH/7.0 mL subcutaneous pen injector Trulicity 1.62 YO/3.7 mL subcutaneous pen injector       empagliflozin (JARDIANCE) 25 mg tablet Jardiance 25 mg tablet  TAKE 1 TABLET BY MOUTH ONCE DAILY BEFORE BREAKFAST       levocetirizine (XYZAL) 5 MG tablet levocetirizine 5 mg tablet       metFORMIN (GLUCOPHAGE) 500 MG tablet metformin 500 mg tablet  TAKE 1 TABLET BY MOUTH TWICE DAILY WITH MEALS       montelukast (SINGULAIR) 10 mg tablet montelukast 10 mg tablet  TAKE 1 TABLET BY MOUTH AT BEDTIME       naproxen sodium (ALEVE)  220 MG tablet Take by mouth       rosuvastatin (CRESTOR) 10 MG tablet         tamsulosin (FLOMAX) 0.4 mg capsule tamsulosin 0.4 mg capsule  TAKE 2 CAPSULES BY MOUTH ONCE DAILY        No current facility-administered medications on file prior to visit.      Family History       Family History  Problem Relation Age of Onset   Diabetes Mother     Breast cancer Mother     Diabetes Father     Diabetes Sister     Coronary Artery Disease (Blocked arteries around heart) Brother     Diabetes Brother          Social History        Tobacco Use  Smoking Status Former   Types: Cigarettes   Quit date: 1988   Years since quitting: 35.7  Smokeless Tobacco Never      Social History  Social History         Socioeconomic History   Marital status: Married  Tobacco Use   Smoking status: Former      Types: Cigarettes      Quit  date: 47      Years since quitting: 35.7   Smokeless tobacco: Never  Vaping Use   Vaping Use: Never used  Substance and Sexual Activity   Alcohol use: Never   Drug use: Never        Objective:         Vitals:    02/28/22 0953  BP: 130/78  Pulse: 100  Temp: 36.8 C (98.2 F)  SpO2: 93%  Weight: (!) 120.1 kg (264 lb 12.8 oz)  Height: 182.9 cm (6')    Body mass index is 35.91 kg/m.   Physical Exam Vitals reviewed.  Constitutional:      General: He is not in acute distress.    Appearance: Normal appearance.  HENT:     Head: Normocephalic and atraumatic.  Eyes:     General: No scleral icterus.    Conjunctiva/sclera: Conjunctivae normal.  Pulmonary:     Effort: Pulmonary effort is normal. No respiratory distress.  Abdominal:     General: There is no distension.     Palpations: Abdomen is soft. There is no mass.     Tenderness: There is no abdominal tenderness.  Musculoskeletal:     Cervical back: Normal range of motion.  Skin:    General: Skin is warm and dry.     Coloration: Skin is not jaundiced.  Neurological:     General: No  focal deficit present.     Mental Status: He is alert and oriented to person, place, and time.          Labs, Imaging and Diagnostic Testing: CT chest/abd/pelvis 02/20/22: IMPRESSION: 1. Similar circumferential rectal wall thickening reflecting patient's known primary neoplasm. 2. Decreased size of the metastatic hepatic lesion. No new suspicious hepatic lesion identified. 3. No evidence of new or progressive disease in the chest, abdomen or pelvis. 4. Mild hepatomegaly and hepatic steatosis. 5. Colonic diverticulosis without findings of acute diverticulitis. 6.  Aortic Atherosclerosis (ICD10-I70.0).     Assessment and Plan:     Diagnoses and all orders for this visit:   Rectal cancer metastasized to liver (CMS-HCC) -     CCS Case Posting Request; Future       This is a 61 yo male with stage IV rectal cancer with a single metastatic liver lesion. I reviewed his restaging scans, which show a persistent lesion in the periphery of segment 7, which has decreased in size compared to pre-treatment scans. There are no other visible liver lesions. This is amenable to wedge resection but due to the location will need to be resected via an open approach through a subcostal incision. I discussed the surgical plan today with the patient and his wife. I would favor proceeding with surgery now, after which he can resume chemotherapy and then proceed with radiation prior to resection of the primary, for which he will follow up with Dr. Dema Severin. We will review his case at [facility] tomorrow, and plan to proceed with surgery about 4 weeks after his his last cycle of chemotherapy. I reviewed the benefits and risks including bleeding, infection, and incisional hernia. He can anticipate a 3-5 day hospital stay. He has a good performance status and is an appropriate surgical candidate. He consents to proceed with surgery and will be contacted to schedule a surgery date. All questions were answered.  Michaelle Birks,  [facility], Hepatobiliary and Pancreatic Surgery 03/01/22 10:00 AM

## 2022-03-01 NOTE — Progress Notes (Signed)
The proposed treatment discussed in conference is for discussion purpose only and is not a binding recommendation.  The patients have not been physically examined, or presented with their treatment options.  Therefore, final treatment plans cannot be decided.  

## 2022-03-01 NOTE — H&P (View-Only) (Signed)
History of Present Illness: [name] [name] is a 61 y.o. male who is seen today for follow up of rectal cancer with metastatic disease to the liver. He has completed 6 cycles of neoadjuvant FOLFOX as of 8/16. He had restaging scans of the chest/abd/pelvis on 8/28 that showed no disease progression and no evidence of new metastatic disease. He is here today to discuss surgery. He has no new complaints today. He has overall tolerating chemotherapy well. He has had some fatigue but no loss of appetite. He denies abdominal pain.   His only prior abdominal surgery is an appendectomy in the 1980s.   Review of Systems: A complete review of systems was obtained from the patient.  I have reviewed this information and discussed as appropriate with the patient.  See HPI as well for other ROS.       Medical History: Past Medical History      Past Medical History:  Diagnosis Date   Diabetes mellitus without complication (CMS-HCC)     History of cancer             Patient Active Problem List  Diagnosis   Rectal cancer metastasized to liver (CMS-HCC)      Past Surgical History'[]'$ Expand by Default       Past Surgical History:  Procedure Laterality Date   APPENDECTOMY   1984   cervial spine surgery   01/2012        Allergies  No Known Allergies           Current Outpatient Medications on File Prior to Visit  Medication Sig Dispense Refill   dulaglutide (TRULICITY) 1.5 JA/2.5 mL subcutaneous pen injector Trulicity 1.5 KN/3.9 mL subcutaneous pen injector       empagliflozin (JARDIANCE) 25 mg tablet Jardiance 25 mg tablet  TAKE 1 TABLET BY MOUTH ONCE DAILY BEFORE BREAKFAST       levocetirizine (XYZAL) 5 MG tablet levocetirizine 5 mg tablet       metFORMIN (GLUCOPHAGE) 500 MG tablet metformin 500 mg tablet  TAKE 1 TABLET BY MOUTH TWICE DAILY WITH MEALS       montelukast (SINGULAIR) 10 mg tablet montelukast 10 mg tablet  TAKE 1 TABLET BY MOUTH AT BEDTIME       naproxen sodium (ALEVE)  220 MG tablet Take by mouth       rosuvastatin (CRESTOR) 10 MG tablet         tamsulosin (FLOMAX) 0.4 mg capsule tamsulosin 0.4 mg capsule  TAKE 2 CAPSULES BY MOUTH ONCE DAILY        No current facility-administered medications on file prior to visit.      Family History       Family History  Problem Relation Age of Onset   Diabetes Mother     Breast cancer Mother     Diabetes Father     Diabetes Sister     Coronary Artery Disease (Blocked arteries around heart) Brother     Diabetes Brother          Social History        Tobacco Use  Smoking Status Former   Types: Cigarettes   Quit date: 1988   Years since quitting: 35.7  Smokeless Tobacco Never      Social History  Social History         Socioeconomic History   Marital status: Married  Tobacco Use   Smoking status: Former      Types: Cigarettes      Quit  date: 62      Years since quitting: 35.7   Smokeless tobacco: Never  Vaping Use   Vaping Use: Never used  Substance and Sexual Activity   Alcohol use: Never   Drug use: Never        Objective:         Vitals:    02/28/22 0953  BP: 130/78  Pulse: 100  Temp: 36.8 C (98.2 F)  SpO2: 93%  Weight: (!) 120.1 kg (264 lb 12.8 oz)  Height: 182.9 cm (6')    Body mass index is 35.91 kg/m.   Physical Exam Vitals reviewed.  Constitutional:      General: He is not in acute distress.    Appearance: Normal appearance.  HENT:     Head: Normocephalic and atraumatic.  Eyes:     General: No scleral icterus.    Conjunctiva/sclera: Conjunctivae normal.  Pulmonary:     Effort: Pulmonary effort is normal. No respiratory distress.  Abdominal:     General: There is no distension.     Palpations: Abdomen is soft. There is no mass.     Tenderness: There is no abdominal tenderness.  Musculoskeletal:     Cervical back: Normal range of motion.  Skin:    General: Skin is warm and dry.     Coloration: Skin is not jaundiced.  Neurological:     General: No  focal deficit present.     Mental Status: He is alert and oriented to person, place, and time.          Labs, Imaging and Diagnostic Testing: CT chest/abd/pelvis 02/20/22: IMPRESSION: 1. Similar circumferential rectal wall thickening reflecting patient's known primary neoplasm. 2. Decreased size of the metastatic hepatic lesion. No new suspicious hepatic lesion identified. 3. No evidence of new or progressive disease in the chest, abdomen or pelvis. 4. Mild hepatomegaly and hepatic steatosis. 5. Colonic diverticulosis without findings of acute diverticulitis. 6.  Aortic Atherosclerosis (ICD10-I70.0).     Assessment and Plan:     Diagnoses and all orders for this visit:   Rectal cancer metastasized to liver (CMS-HCC) -     CCS Case Posting Request; Future       This is a 61 yo male with stage IV rectal cancer with a single metastatic liver lesion. I reviewed his restaging scans, which show a persistent lesion in the periphery of segment 7, which has decreased in size compared to pre-treatment scans. There are no other visible liver lesions. This is amenable to wedge resection but due to the location will need to be resected via an open approach through a subcostal incision. I discussed the surgical plan today with the patient and his wife. I would favor proceeding with surgery now, after which he can resume chemotherapy and then proceed with radiation prior to resection of the primary, for which he will follow up with Dr. Dema Severin. We will review his case at [facility] tomorrow, and plan to proceed with surgery about 4 weeks after his his last cycle of chemotherapy. I reviewed the benefits and risks including bleeding, infection, and incisional hernia. He can anticipate a 3-5 day hospital stay. He has a good performance status and is an appropriate surgical candidate. He consents to proceed with surgery and will be contacted to schedule a surgery date. All questions were answered.  Michaelle Birks,  [facility], Hepatobiliary and Pancreatic Surgery 03/01/22 10:00 AM

## 2022-03-09 ENCOUNTER — Encounter (HOSPITAL_COMMUNITY): Payer: Self-pay | Admitting: Surgery

## 2022-03-09 NOTE — Progress Notes (Signed)
PCP - Evelina Dun, FNP Cardiologist - n/a Oncology - Dr Vivia Budge  CT Chest x-ray - 02/20/22 EKG - n/a Stress Test - n/a ECHO - n/a Cardiac Cath - n/a  ICD Pacemaker/Loop - n/a  Sleep Study -  n/a CPAP - none  Do not take Metformin or Jardiance on of surgery. This is a SDW call made on 03/09/22.    Patient only takes Insulin when getting chemo.        If your blood sugar is less than 70 mg/dL, you will need to treat for low blood sugar: Treat a low blood sugar (less than 70 mg/dL) with  cup of clear juice (cranberry or apple), 4 glucose tablets, OR glucose gel. Recheck blood sugar in 15 minutes after treatment (to make sure it is greater than 70 mg/dL). If your blood sugar is not greater than 70 mg/dL on recheck, call [phone] for further instructions.  ERAS: Clear liquids til 6:30 AM DOS.  STOP now taking any Aspirin (unless otherwise instructed by your surgeon), Aleve, Naproxen, Ibuprofen, Motrin, Advil, Goody's, BC's, all herbal medications, fish oil, and all vitamins.   Coronavirus Screening Do you have any of the following symptoms:  Cough yes/no: No Fever (>100.3F)  yes/no: No Runny nose yes/no: No Sore throat yes/no: No Difficulty breathing/shortness of breath  yes/no: No  Have you traveled in the last 14 days and where? yes/no: No  Patient verbalized understanding of instructions that were given via phone.

## 2022-03-10 ENCOUNTER — Other Ambulatory Visit: Payer: Self-pay

## 2022-03-10 ENCOUNTER — Inpatient Hospital Stay (HOSPITAL_COMMUNITY): Payer: Commercial Managed Care - PPO | Admitting: Anesthesiology

## 2022-03-10 ENCOUNTER — Inpatient Hospital Stay (HOSPITAL_COMMUNITY)
Admission: RE | Admit: 2022-03-10 | Discharge: 2022-03-12 | DRG: 406 | Disposition: A | Discharge status: Home / Self Care | Payer: Commercial Managed Care - PPO | Attending: Surgery | Admitting: Surgery

## 2022-03-10 ENCOUNTER — Encounter (HOSPITAL_COMMUNITY): Payer: Self-pay | Admitting: Surgery

## 2022-03-10 ENCOUNTER — Encounter (HOSPITAL_COMMUNITY)
Admission: RE | Disposition: A | Discharge status: Home / Self Care | Payer: Self-pay | Source: Home / Self Care | Attending: Surgery

## 2022-03-10 DIAGNOSIS — Z7984 Long term (current) use of oral hypoglycemic drugs: Secondary | ICD-10-CM | POA: Diagnosis not present

## 2022-03-10 DIAGNOSIS — Z79899 Other long term (current) drug therapy: Secondary | ICD-10-CM

## 2022-03-10 DIAGNOSIS — Z9221 Personal history of antineoplastic chemotherapy: Secondary | ICD-10-CM | POA: Diagnosis not present

## 2022-03-10 DIAGNOSIS — E1165 Type 2 diabetes mellitus with hyperglycemia: Secondary | ICD-10-CM | POA: Diagnosis not present

## 2022-03-10 DIAGNOSIS — Z833 Family history of diabetes mellitus: Secondary | ICD-10-CM

## 2022-03-10 DIAGNOSIS — Z87891 Personal history of nicotine dependence: Secondary | ICD-10-CM | POA: Diagnosis not present

## 2022-03-10 DIAGNOSIS — D490 Neoplasm of unspecified behavior of digestive system: Secondary | ICD-10-CM

## 2022-03-10 DIAGNOSIS — C20 Malignant neoplasm of rectum: Secondary | ICD-10-CM

## 2022-03-10 DIAGNOSIS — Z803 Family history of malignant neoplasm of breast: Secondary | ICD-10-CM | POA: Diagnosis not present

## 2022-03-10 DIAGNOSIS — C787 Secondary malignant neoplasm of liver and intrahepatic bile duct: Secondary | ICD-10-CM | POA: Diagnosis present

## 2022-03-10 DIAGNOSIS — C189 Malignant neoplasm of colon, unspecified: Secondary | ICD-10-CM | POA: Diagnosis present

## 2022-03-10 DIAGNOSIS — E119 Type 2 diabetes mellitus without complications: Secondary | ICD-10-CM

## 2022-03-10 DIAGNOSIS — K760 Fatty (change of) liver, not elsewhere classified: Secondary | ICD-10-CM | POA: Diagnosis present

## 2022-03-10 DIAGNOSIS — Z8249 Family history of ischemic heart disease and other diseases of the circulatory system: Secondary | ICD-10-CM | POA: Diagnosis not present

## 2022-03-10 HISTORY — PX: LAPAROSCOPY: SHX197

## 2022-03-10 HISTORY — DX: Personal history of other diseases of the musculoskeletal system and connective tissue: Z87.39

## 2022-03-10 HISTORY — PX: OPEN PARTIAL HEPATECTOMY [83]: SHX5987

## 2022-03-10 LAB — GLUCOSE, CAPILLARY
Glucose-Capillary: 210 mg/dL — ABNORMAL HIGH (ref 70–99)
Glucose-Capillary: 123 mg/dL — ABNORMAL HIGH (ref 70–99)
Glucose-Capillary: 234 mg/dL — ABNORMAL HIGH (ref 70–99)
Glucose-Capillary: 215 mg/dL — ABNORMAL HIGH (ref 70–99)
Glucose-Capillary: 277 mg/dL — ABNORMAL HIGH (ref 70–99)
Glucose-Capillary: 198 mg/dL — ABNORMAL HIGH (ref 70–99)

## 2022-03-10 LAB — CBC
HCT: 39.6 % (ref 39.0–52.0)
Hemoglobin: 13.3 g/dL (ref 13.0–17.0)
MCH: 30.2 pg (ref 26.0–34.0)
MCHC: 33.6 g/dL (ref 30.0–36.0)
MCV: 90 fL (ref 80.0–100.0)
Platelets: 174 10*3/uL (ref 150–400)
RBC: 4.4 MIL/uL (ref 4.22–5.81)
RDW: 15.1 % (ref 11.5–15.5)
WBC: 17.4 10*3/uL — ABNORMAL HIGH (ref 4.0–10.5)
nRBC: 0 % (ref 0.0–0.2)

## 2022-03-10 LAB — COMPREHENSIVE METABOLIC PANEL
ALT: 479 U/L — ABNORMAL HIGH (ref 0–44)
AST: 594 U/L — ABNORMAL HIGH (ref 15–41)
Albumin: 4 g/dL (ref 3.5–5.0)
Alkaline Phosphatase: 62 U/L (ref 38–126)
Anion gap: 11 (ref 5–15)
BUN: 13 mg/dL (ref 8–23)
CO2: 19 mmol/L — ABNORMAL LOW (ref 22–32)
Calcium: 8.4 mg/dL — ABNORMAL LOW (ref 8.9–10.3)
Chloride: 109 mmol/L (ref 98–111)
Creatinine, Ser: 1 mg/dL (ref 0.61–1.24)
GFR, Estimated: >60 mL/min (ref >60)
Glucose, Bld: 245 mg/dL — ABNORMAL HIGH (ref 70–99)
Potassium: 3.6 mmol/L (ref 3.5–5.1)
Sodium: 139 mmol/L (ref 135–145)
Total Bilirubin: 0.4 mg/dL (ref 0.3–1.2)
Total Protein: 5.9 g/dL — ABNORMAL LOW (ref 6.5–8.1)

## 2022-03-10 LAB — ABO/RH: ABO/RH(D): O NEG

## 2022-03-10 LAB — PREPARE RBC (CROSSMATCH)

## 2022-03-10 SURGERY — HEPATECTOMY, PARTIAL, OPEN
Anesthesia: General | Site: Abdomen

## 2022-03-10 MED ORDER — LACTATED RINGERS IV SOLN: INTRAVENOUS | Status: DC

## 2022-03-10 MED ORDER — GABAPENTIN 300 MG PO CAPS
300.0000 mg | ORAL_CAPSULE | ORAL | Status: AC
  Administered 2022-03-10: 300 mg via ORAL
  Filled 2022-03-10: qty 1

## 2022-03-10 MED ORDER — ENOXAPARIN SODIUM 40 MG/0.4ML IJ SOSY
40.0000 mg | PREFILLED_SYRINGE | INTRAMUSCULAR | Status: DC
  Administered 2022-03-11 – 2022-03-12 (×2): 40 mg via SUBCUTANEOUS
  Filled 2022-03-10 (×2): qty 0.4

## 2022-03-10 MED ORDER — LIDOCAINE 2% (20 MG/ML) 5 ML SYRINGE
INTRAMUSCULAR | Status: AC
  Filled 2022-03-10: qty 5

## 2022-03-10 MED ORDER — PROCHLORPERAZINE MALEATE 10 MG PO TABS: 10.0000 mg | ORAL_TABLET | Freq: Four times a day (QID) | ORAL | Status: DC | PRN

## 2022-03-10 MED ORDER — DIPHENHYDRAMINE HCL 50 MG/ML IJ SOLN: 12.5000 mg | Freq: Four times a day (QID) | INTRAMUSCULAR | Status: DC | PRN

## 2022-03-10 MED ORDER — DEXAMETHASONE SODIUM PHOSPHATE 10 MG/ML IJ SOLN
INTRAMUSCULAR | Status: DC | PRN
  Administered 2022-03-10: 10 mg via INTRAVENOUS

## 2022-03-10 MED ORDER — INSULIN ASPART 100 UNIT/ML IJ SOLN: 0.0000 [IU] | INTRAMUSCULAR | Status: DC | PRN

## 2022-03-10 MED ORDER — ROSUVASTATIN CALCIUM 5 MG PO TABS
10.0000 mg | ORAL_TABLET | Freq: Every day | ORAL | Status: DC
  Administered 2022-03-11 – 2022-03-12 (×2): 10 mg via ORAL
  Filled 2022-03-10 (×2): qty 2

## 2022-03-10 MED ORDER — ONDANSETRON HCL 4 MG/2ML IJ SOLN
INTRAMUSCULAR | Status: DC | PRN
  Administered 2022-03-10: 4 mg via INTRAVENOUS

## 2022-03-10 MED ORDER — VASOPRESSIN 20 UNIT/ML IV SOLN
INTRAVENOUS | Status: DC | PRN
  Administered 2022-03-10 (×2): 2 [IU] via INTRAVENOUS
  Administered 2022-03-10: 1 [IU] via INTRAVENOUS

## 2022-03-10 MED ORDER — ONDANSETRON HCL 4 MG/2ML IJ SOLN
INTRAMUSCULAR | Status: AC
  Filled 2022-03-10: qty 2

## 2022-03-10 MED ORDER — VASOPRESSIN 20 UNIT/ML IV SOLN
INTRAVENOUS | Status: AC
  Filled 2022-03-10: qty 1

## 2022-03-10 MED ORDER — ACETAMINOPHEN 500 MG PO TABS
1000.0000 mg | ORAL_TABLET | ORAL | Status: AC
  Administered 2022-03-10: 1000 mg via ORAL
  Filled 2022-03-10: qty 2

## 2022-03-10 MED ORDER — HYDROMORPHONE HCL 1 MG/ML IJ SOLN
INTRAMUSCULAR | Status: AC
  Filled 2022-03-10: qty 0.5

## 2022-03-10 MED ORDER — ORAL CARE MOUTH RINSE: 15.0000 mL | Freq: Once | OROMUCOSAL | Status: AC

## 2022-03-10 MED ORDER — ALBUMIN HUMAN 5 % IV SOLN: INTRAVENOUS | Status: DC | PRN

## 2022-03-10 MED ORDER — LIDOCAINE 2% (20 MG/ML) 5 ML SYRINGE
INTRAMUSCULAR | Status: DC | PRN
  Administered 2022-03-10: 100 mg via INTRAVENOUS

## 2022-03-10 MED ORDER — KETOROLAC TROMETHAMINE 30 MG/ML IJ SOLN
INTRAMUSCULAR | Status: AC
  Filled 2022-03-10: qty 1

## 2022-03-10 MED ORDER — HYDROMORPHONE HCL 1 MG/ML IJ SOLN: 0.2500 mg | INTRAMUSCULAR | Status: DC | PRN

## 2022-03-10 MED ORDER — OXYCODONE HCL 5 MG PO TABS
5.0000 mg | ORAL_TABLET | ORAL | Status: DC | PRN
  Administered 2022-03-10: 10 mg via ORAL
  Administered 2022-03-11: 5 mg via ORAL
  Administered 2022-03-11 – 2022-03-12 (×7): 10 mg via ORAL
  Filled 2022-03-10 (×9): qty 2
  Filled 2022-03-10: qty 1

## 2022-03-10 MED ORDER — CHLORHEXIDINE GLUCONATE 0.12 % MT SOLN
15.0000 mL | Freq: Once | OROMUCOSAL | Status: AC
  Administered 2022-03-10: 15 mL via OROMUCOSAL
  Filled 2022-03-10: qty 15

## 2022-03-10 MED ORDER — SODIUM CHLORIDE 0.9 % IR SOLN
Status: DC | PRN
  Administered 2022-03-10: 1000 mL

## 2022-03-10 MED ORDER — CEFAZOLIN IN SODIUM CHLORIDE 3-0.9 GM/100ML-% IV SOLN
3.0000 g | INTRAVENOUS | Status: AC
  Administered 2022-03-10: 3 g via INTRAVENOUS
  Filled 2022-03-10: qty 100

## 2022-03-10 MED ORDER — ENSURE PRE-SURGERY PO LIQD: 592.0000 mL | Freq: Once | ORAL | Status: DC

## 2022-03-10 MED ORDER — METHOCARBAMOL 1000 MG/10ML IJ SOLN
1000.0000 mg | Freq: Three times a day (TID) | INTRAVENOUS | Status: DC
  Filled 2022-03-10: qty 10

## 2022-03-10 MED ORDER — 0.9 % SODIUM CHLORIDE (POUR BTL) OPTIME
TOPICAL | Status: DC | PRN
  Administered 2022-03-10 (×2): 1000 mL

## 2022-03-10 MED ORDER — PANTOPRAZOLE SODIUM 40 MG PO TBEC
40.0000 mg | DELAYED_RELEASE_TABLET | Freq: Every day | ORAL | Status: DC
  Administered 2022-03-11 – 2022-03-12 (×2): 40 mg via ORAL
  Filled 2022-03-10 (×2): qty 1

## 2022-03-10 MED ORDER — HEMOSTATIC AGENTS (NO CHARGE) OPTIME
TOPICAL | Status: DC | PRN
  Administered 2022-03-10: 1 via TOPICAL

## 2022-03-10 MED ORDER — TAMSULOSIN HCL 0.4 MG PO CAPS
0.8000 mg | ORAL_CAPSULE | Freq: Every day | ORAL | Status: DC
  Administered 2022-03-11 – 2022-03-12 (×2): 0.8 mg via ORAL
  Filled 2022-03-10 (×2): qty 2

## 2022-03-10 MED ORDER — ENSURE PRE-SURGERY PO LIQD: 296.0000 mL | Freq: Once | ORAL | Status: DC

## 2022-03-10 MED ORDER — PROPOFOL 10 MG/ML IV BOLUS
INTRAVENOUS | Status: AC
  Filled 2022-03-10: qty 20

## 2022-03-10 MED ORDER — INSULIN ASPART 100 UNIT/ML IJ SOLN
INTRAMUSCULAR | Status: AC
  Administered 2022-03-10: 4 [IU] via SUBCUTANEOUS
  Filled 2022-03-10: qty 1

## 2022-03-10 MED ORDER — FENTANYL CITRATE (PF) 250 MCG/5ML IJ SOLN
INTRAMUSCULAR | Status: AC
  Filled 2022-03-10: qty 5

## 2022-03-10 MED ORDER — INSULIN ASPART 100 UNIT/ML IJ SOLN
0.0000 [IU] | INTRAMUSCULAR | Status: DC
  Administered 2022-03-10: 4 [IU] via SUBCUTANEOUS
  Administered 2022-03-10: 11 [IU] via SUBCUTANEOUS
  Administered 2022-03-10 – 2022-03-11 (×5): 7 [IU] via SUBCUTANEOUS
  Administered 2022-03-11: 4 [IU] via SUBCUTANEOUS
  Administered 2022-03-11: 11 [IU] via SUBCUTANEOUS
  Administered 2022-03-12 (×2): 7 [IU] via SUBCUTANEOUS

## 2022-03-10 MED ORDER — MIDAZOLAM HCL 2 MG/2ML IJ SOLN
INTRAMUSCULAR | Status: AC
  Filled 2022-03-10: qty 2

## 2022-03-10 MED ORDER — ONDANSETRON HCL 4 MG/2ML IJ SOLN: 4.0000 mg | Freq: Once | INTRAMUSCULAR | Status: DC | PRN

## 2022-03-10 MED ORDER — AMISULPRIDE (ANTIEMETIC) 5 MG/2ML IV SOLN
10.0000 mg | Freq: Once | INTRAVENOUS | Status: AC | PRN
  Administered 2022-03-10: 10 mg via INTRAVENOUS

## 2022-03-10 MED ORDER — PROPOFOL 10 MG/ML IV BOLUS
INTRAVENOUS | Status: DC | PRN
  Administered 2022-03-10: 200 mg via INTRAVENOUS

## 2022-03-10 MED ORDER — DEXAMETHASONE SODIUM PHOSPHATE 10 MG/ML IJ SOLN
INTRAMUSCULAR | Status: AC
  Filled 2022-03-10: qty 1

## 2022-03-10 MED ORDER — DIPHENHYDRAMINE HCL 12.5 MG/5ML PO ELIX: 12.5000 mg | ORAL_SOLUTION | Freq: Four times a day (QID) | ORAL | Status: DC | PRN

## 2022-03-10 MED ORDER — OXYCODONE HCL 5 MG/5ML PO SOLN: 5.0000 mg | Freq: Once | ORAL | Status: DC | PRN

## 2022-03-10 MED ORDER — PROCHLORPERAZINE EDISYLATE 10 MG/2ML IJ SOLN: 5.0000 mg | Freq: Four times a day (QID) | INTRAMUSCULAR | Status: DC | PRN

## 2022-03-10 MED ORDER — PHENYLEPHRINE 80 MCG/ML (10ML) SYRINGE FOR IV PUSH (FOR BLOOD PRESSURE SUPPORT)
PREFILLED_SYRINGE | INTRAVENOUS | Status: DC | PRN
  Administered 2022-03-10 (×3): 80 ug via INTRAVENOUS
  Administered 2022-03-10 (×2): 160 ug via INTRAVENOUS
  Administered 2022-03-10: 80 ug via INTRAVENOUS

## 2022-03-10 MED ORDER — MIDAZOLAM HCL 2 MG/2ML IJ SOLN
INTRAMUSCULAR | Status: DC | PRN
  Administered 2022-03-10: 2 mg via INTRAVENOUS

## 2022-03-10 MED ORDER — ROCURONIUM BROMIDE 10 MG/ML (PF) SYRINGE
PREFILLED_SYRINGE | INTRAVENOUS | Status: DC | PRN
  Administered 2022-03-10: 30 mg via INTRAVENOUS
  Administered 2022-03-10: 70 mg via INTRAVENOUS
  Administered 2022-03-10: 100 mg via INTRAVENOUS

## 2022-03-10 MED ORDER — HYDROMORPHONE HCL 1 MG/ML IJ SOLN
0.5000 mg | INTRAMUSCULAR | Status: DC | PRN
  Administered 2022-03-10 – 2022-03-11 (×5): 0.5 mg via INTRAVENOUS
  Filled 2022-03-10 (×5): qty 0.5

## 2022-03-10 MED ORDER — FENTANYL CITRATE (PF) 250 MCG/5ML IJ SOLN
INTRAMUSCULAR | Status: DC | PRN
  Administered 2022-03-10: 100 ug via INTRAVENOUS
  Administered 2022-03-10: 50 ug via INTRAVENOUS
  Administered 2022-03-10: 100 ug via INTRAVENOUS
  Administered 2022-03-10: 50 ug via INTRAVENOUS
  Administered 2022-03-10: 100 ug via INTRAVENOUS
  Administered 2022-03-10 (×2): 50 ug via INTRAVENOUS

## 2022-03-10 MED ORDER — OXYCODONE HCL 5 MG PO TABS: 5.0000 mg | ORAL_TABLET | Freq: Once | ORAL | Status: DC | PRN

## 2022-03-10 MED ORDER — MONTELUKAST SODIUM 10 MG PO TABS
10.0000 mg | ORAL_TABLET | Freq: Every day | ORAL | Status: DC
  Administered 2022-03-10 – 2022-03-11 (×2): 10 mg via ORAL
  Filled 2022-03-10 (×2): qty 1

## 2022-03-10 MED ORDER — DOCUSATE SODIUM 100 MG PO CAPS
100.0000 mg | ORAL_CAPSULE | Freq: Two times a day (BID) | ORAL | Status: DC
  Administered 2022-03-10 – 2022-03-12 (×4): 100 mg via ORAL
  Filled 2022-03-10 (×4): qty 1

## 2022-03-10 MED ORDER — PHENYLEPHRINE 80 MCG/ML (10ML) SYRINGE FOR IV PUSH (FOR BLOOD PRESSURE SUPPORT)
PREFILLED_SYRINGE | INTRAVENOUS | Status: AC
  Filled 2022-03-10: qty 10

## 2022-03-10 MED ORDER — ROCURONIUM BROMIDE 10 MG/ML (PF) SYRINGE
PREFILLED_SYRINGE | INTRAVENOUS | Status: AC
  Filled 2022-03-10: qty 10

## 2022-03-10 MED ORDER — ALBUTEROL SULFATE HFA 108 (90 BASE) MCG/ACT IN AERS: 2.0000 | INHALATION_SPRAY | Freq: Four times a day (QID) | RESPIRATORY_TRACT | Status: DC | PRN

## 2022-03-10 MED ORDER — ACETAMINOPHEN 500 MG PO TABS
1000.0000 mg | ORAL_TABLET | Freq: Three times a day (TID) | ORAL | Status: DC
  Administered 2022-03-10 – 2022-03-12 (×6): 1000 mg via ORAL
  Filled 2022-03-10 (×6): qty 2

## 2022-03-10 MED ORDER — AMISULPRIDE (ANTIEMETIC) 5 MG/2ML IV SOLN
INTRAVENOUS | Status: AC
  Filled 2022-03-10: qty 4

## 2022-03-10 MED ORDER — ONDANSETRON HCL 4 MG/2ML IJ SOLN
4.0000 mg | Freq: Three times a day (TID) | INTRAMUSCULAR | Status: DC
  Administered 2022-03-10 – 2022-03-11 (×3): 4 mg via INTRAVENOUS
  Filled 2022-03-10 (×6): qty 2

## 2022-03-10 MED ORDER — SUGAMMADEX SODIUM 200 MG/2ML IV SOLN
INTRAVENOUS | Status: DC | PRN
  Administered 2022-03-10: 400 mg via INTRAVENOUS

## 2022-03-10 MED ORDER — HYDROMORPHONE HCL 1 MG/ML IJ SOLN
INTRAMUSCULAR | Status: DC | PRN
  Administered 2022-03-10 (×2): .5 mg via INTRAVENOUS

## 2022-03-10 MED ORDER — METHOCARBAMOL 1000 MG/10ML IJ SOLN
1000.0000 mg | Freq: Three times a day (TID) | INTRAVENOUS | Status: DC
  Administered 2022-03-10 – 2022-03-12 (×4): 1000 mg via INTRAVENOUS
  Filled 2022-03-10: qty 1000
  Filled 2022-03-10 (×3): qty 10
  Filled 2022-03-10 (×2): qty 1000
  Filled 2022-03-10 (×2): qty 10

## 2022-03-10 MED ORDER — LORAZEPAM 0.5 MG PO TABS: 0.5000 mg | ORAL_TABLET | Freq: Three times a day (TID) | ORAL | Status: DC | PRN

## 2022-03-10 MED ORDER — PHENYLEPHRINE HCL-NACL 20-0.9 MG/250ML-% IV SOLN
INTRAVENOUS | Status: DC | PRN
  Administered 2022-03-10: 50 ug/min via INTRAVENOUS

## 2022-03-10 SURGICAL SUPPLY — 83 items
BAG COUNTER SPONGE SURGICOUNT (BAG) ×1 IMPLANT
BIOPATCH RED 1 DISK 7.0 (GAUZE/BANDAGES/DRESSINGS) ×2 IMPLANT
BLADE CLIPPER SURG (BLADE) IMPLANT
BOOT SUTURE AID YELLOW STND (SUTURE) IMPLANT
CANISTER SUCT 3000ML PPV (MISCELLANEOUS) ×1 IMPLANT
CHLORAPREP W/TINT 26 (MISCELLANEOUS) ×1 IMPLANT
CLIP TI MEDIUM 24 (CLIP) IMPLANT
CLIP TI WIDE RED SMALL 24 (CLIP) ×1 IMPLANT
CLIP VESOCCLUDE MED 24/CT (CLIP) ×1 IMPLANT
CNTNR URN SCR LID CUP LEK RST (MISCELLANEOUS) IMPLANT
CONT SPEC 4OZ STRL OR WHT (MISCELLANEOUS)
COVER SURGICAL LIGHT HANDLE (MISCELLANEOUS) ×1 IMPLANT
DERMABOND ADVANCE IMPLANT
DERMABOND ADVANCED .7 DNX12 (GAUZE/BANDAGES/DRESSINGS) ×2 IMPLANT
DRAIN CHANNEL 19F RND (DRAIN) ×1 IMPLANT
DRAPE INCISE IOBAN 66X45 STRL (DRAPES) ×1 IMPLANT
DRAPE LAPAROSCOPIC ABDOMINAL (DRAPES) ×1 IMPLANT
DRAPE WARM FLUID 44X44 (DRAPES) ×1 IMPLANT
DRSG TEGADERM 4X4.75 (GAUZE/BANDAGES/DRESSINGS) ×2 IMPLANT
DRSG TELFA 3X8 NADH STRL (GAUZE/BANDAGES/DRESSINGS) IMPLANT
ELECT BLADE 6.5 EXT (BLADE) ×1 IMPLANT
ELECT CAUTERY BLADE 6.4 (BLADE) ×1 IMPLANT
ELECT PAD DSPR THERM+ ADLT (MISCELLANEOUS) ×1 IMPLANT
ELECT REM PT RETURN 9FT ADLT (ELECTROSURGICAL) ×1
ELECTRODE REM PT RTRN 9FT ADLT (ELECTROSURGICAL) ×1 IMPLANT
EVACUATOR SILICONE 100CC (DRAIN) ×1 IMPLANT
GAUZE 4X4 16PLY ~~LOC~~+RFID DBL (SPONGE) IMPLANT
GLOVE BIOGEL PI IND STRL 6 (GLOVE) ×1 IMPLANT
GLOVE BIOGEL PI MICRO STRL 5.5 (GLOVE) ×2 IMPLANT
GLOVE SURG POLY MICRO LF SZ5.5 (GLOVE) ×1 IMPLANT
GLOVE SURG UNDER POLY LF SZ6 (GLOVE) ×1 IMPLANT
GOWN STRL REUS W/ TWL LRG LVL3 (GOWN DISPOSABLE) ×2 IMPLANT
GOWN STRL REUS W/TWL LRG LVL3 (GOWN DISPOSABLE) ×2
HAND PENCIL TRP OPTION (MISCELLANEOUS) ×1 IMPLANT
HANDLE SUCTION POOLE (INSTRUMENTS) ×1 IMPLANT
HEMOSTAT ARISTA ABSORB 3G PWDR (HEMOSTASIS) IMPLANT
HEMOSTAT HEMOBLAST BELLOWS (HEMOSTASIS) IMPLANT
HEMOSTAT SNOW SURGICEL 2X4 (HEMOSTASIS) ×1 IMPLANT
KIT BASIN OR (CUSTOM PROCEDURE TRAY) ×1 IMPLANT
KIT TURNOVER KIT B (KITS) ×1 IMPLANT
LIGASURE IMPACT 36 18CM CVD LR (INSTRUMENTS) IMPLANT
NDL INSUFFLATION 14GA 120MM (NEEDLE) ×1 IMPLANT
NEEDLE INSUFFLATION 14GA 120MM (NEEDLE) ×1 IMPLANT
NS IRRIG 1000ML POUR BTL (IV SOLUTION) ×2 IMPLANT
PACK GENERAL/GYN (CUSTOM PROCEDURE TRAY) ×1 IMPLANT
PAD ARMBOARD 7.5X6 YLW CONV (MISCELLANEOUS) ×2 IMPLANT
PENCIL SMOKE EVACUATOR (MISCELLANEOUS) ×1 IMPLANT
RETRACTOR WOUND ALXS 34CM XLRG (MISCELLANEOUS) IMPLANT
RTRCTR WOUND ALEXIS 34CM XLRG (MISCELLANEOUS)
SCISSORS LAP 5X35 DISP (ENDOMECHANICALS) IMPLANT
SEALER BIPOLAR AQUA 6.0 (INSTRUMENTS) ×1 IMPLANT
SET IRRIG TUBING LAPAROSCOPIC (IRRIGATION / IRRIGATOR) IMPLANT
SET TUBE SMOKE EVAC HIGH FLOW (TUBING) ×1 IMPLANT
SLEEVE ENDOPATH XCEL 5M (ENDOMECHANICALS) ×1 IMPLANT
SPONGE T-LAP 18X18 ~~LOC~~+RFID (SPONGE) ×1 IMPLANT
SUCTION POOLE HANDLE (INSTRUMENTS) ×1
SUT CHROMIC 0 BP (SUTURE) ×2 IMPLANT
SUT CHROMIC 3 0 CT 36 (SUTURE) IMPLANT
SUT ETHILON 2 0 FS 18 (SUTURE) ×1 IMPLANT
SUT MNCRL AB 4-0 PS2 18 (SUTURE) ×1 IMPLANT
SUT PDS AB 1 TP1 96 (SUTURE) ×2 IMPLANT
SUT PDS AB 4-0 RB1 27 (SUTURE) IMPLANT
SUT PROLENE 2 0 SH DA (SUTURE) IMPLANT
SUT PROLENE 3 0 SH 48 (SUTURE) ×1 IMPLANT
SUT PROLENE 4 0 RB 1 (SUTURE) ×5
SUT PROLENE 4-0 RB1 .5 CRCL 36 (SUTURE) ×2 IMPLANT
SUT PROLENE 4-0 RB1 18X2 ARM (SUTURE) IMPLANT
SUT SILK 2 0 SH (SUTURE) IMPLANT
SUT SILK 2 0 TIES 10X30 (SUTURE) ×1 IMPLANT
SUT SILK 2 0SH CR/8 30 (SUTURE) IMPLANT
SUT SILK 3 0 TIES 10X30 (SUTURE) IMPLANT
SUT SILK 3 0SH CR/8 30 (SUTURE) IMPLANT
SUT VIC AB 3-0 SH 27 (SUTURE) ×1
SUT VIC AB 3-0 SH 27X BRD (SUTURE) ×1 IMPLANT
SYR BULB IRRIG 60ML STRL (SYRINGE) ×1 IMPLANT
TOWEL GREEN STERILE (TOWEL DISPOSABLE) ×1 IMPLANT
TOWEL GREEN STERILE FF (TOWEL DISPOSABLE) ×1 IMPLANT
TRAY FOLEY MTR SLVR 14FR STAT (SET/KITS/TRAYS/PACK) ×1 IMPLANT
TRAY LAPAROSCOPIC MC (CUSTOM PROCEDURE TRAY) ×1 IMPLANT
TROCAR XCEL BLUNT TIP 100MML (ENDOMECHANICALS) IMPLANT
TROCAR Z-THREAD OPTICAL 5X100M (TROCAR) ×1 IMPLANT
TUBE CONNECTING 12X1/4 (SUCTIONS) IMPLANT
WARMER LAPAROSCOPE (MISCELLANEOUS) ×1 IMPLANT

## 2022-03-10 NOTE — Transfer of Care (Signed)
Immediate Anesthesia Transfer of Care Note  Patient: Eston L Erskin  Procedure(s) Performed: OPEN PARTIAL HEPATECTOMY (Abdomen) STAGING LAPAROSCOPY (Abdomen)  Patient Location: PACU  Anesthesia Type:General  Level of Consciousness: drowsy and patient cooperative  Airway & Oxygen Therapy: Patient Spontanous Breathing  Post-op Assessment: Report given to RN and Post -op Vital signs reviewed and stable  Post vital signs: Reviewed and stable  Last Vitals:  Vitals Value Taken Time  BP 154/69 03/10/22 1234  Temp    Pulse 120 03/10/22 1235  Resp 18 03/10/22 1235  SpO2 92 % 03/10/22 1235  Vitals shown include unvalidated device data.  Last Pain:  Vitals:   03/10/22 0743  TempSrc:   PainSc: 0-No pain         Complications: No notable events documented.

## 2022-03-10 NOTE — Anesthesia Preprocedure Evaluation (Addendum)
Anesthesia Evaluation  Patient identified by MRN, date of birth, ID band Patient awake    Reviewed: Allergy & Precautions, NPO status , Patient's Chart, lab work & pertinent test results  History of Anesthesia Complications Negative for: history of anesthetic complications  Airway Mallampati: I  TM Distance: >3 FB Neck ROM: Full    Dental  (+) Teeth Intact, Dental Advisory Given   Pulmonary asthma , former smoker,    Pulmonary exam normal        Cardiovascular negative cardio ROS Normal cardiovascular exam     Neuro/Psych negative neurological ROS     GI/Hepatic GERD  ,Liver metastasis Metastatic colon cancer   Endo/Other  diabetes, Type 2, Oral Hypoglycemic Agents  Renal/GU negative Renal ROS  negative genitourinary   Musculoskeletal  (+) Arthritis ,   Abdominal   Peds  Hematology negative hematology ROS (+)   Anesthesia Other Findings Trulicity- last dose 9/7  Reproductive/Obstetrics                           Anesthesia Physical Anesthesia Plan  ASA: 3  Anesthesia Plan: General   Post-op Pain Management: Toradol IV (intra-op)* and Dilaudid IV   Induction: Intravenous  PONV Risk Score and Plan: 3 and Ondansetron, Dexamethasone, Treatment may vary due to age or medical condition and Midazolam  Airway Management Planned: Oral ETT  Additional Equipment: Arterial line  Intra-op Plan:   Post-operative Plan: Extubation in OR  Informed Consent:   Plan Discussed with:   Anesthesia Plan Comments: (Large bore PIV x2, arterial line)       Anesthesia Quick Evaluation

## 2022-03-10 NOTE — Op Note (Signed)
Date: 03/10/22  Patient: [name] [name] MRN: [id number]  Preoperative Diagnosis: Metastatic rectal cancer Postoperative Diagnosis: Same  Procedure: Staging laparoscopy Partial right hepatectomy (metastasectomy of segment 7 tumor) Intraoperative ultrasound  Surgeon: Michaelle Birks, MD Assistant: Armandina Gemma, MD  EBL: 500 mL  Anesthesia: General endotracheal  Specimens: Right liver mass  Indications: [name] [name] is a 61 yo male who was diagnosed with rectal cancer in May of this year. He was found to have a single metastatic lesion to the liver in segment 7, with no other evidence of metastatic disease. He has completed 6 cycles neoadjuvant FOLFOX with downsizing of the liver lesion. After a multidisciplinary discussion the decision was made to proceed with resection of the liver lesion. He will then complete radiation followed by resection of the primary.  Findings: Single lesion in segment 7 of the liver consistent with known metastatic disease. No other evidence of metastatic disease within the abdomen. Hepatic steatosis.  Procedure details: Informed consent was obtained in the preoperative area prior to the procedure. The patient was brought to the operating room and placed on the table in the supine position. General anesthesia was induced and appropriate lines and drains were placed for intraoperative monitoring. Perioperative antibiotics were administered per SCIP guidelines. The abdomen was prepped and draped in the usual sterile fashion. A pre-procedure timeout was taken verifying patient identity, surgical site and procedure to be performed.  A small incision was made in the right subcostal margin and the fascia was grasped and elevated. A Veress needle was inserted through the fascia and intraperitoneal placement was confirmed with the saline drop test. The abdomen was insufflated and a 72m visiport was placed. The abdomen was inspected including the liver, diaphragm,  peritoneal surface and pelvis. There was no evidence of metastatic disease. The port was removed and the abdomen was desufflated.  A right subcostal skin incision was made with a vertical midline extension.  The subcutaneous tissue was divided with cautery.  The fascia was opened along the linea alba at midline.  The subcostal fascia was opened and the rectus muscles and posterior rectus sheath were divided.  The falciform ligament was ligated with 2-0 silk ties and divided, and taken down off the abdominal wall.  A Thompson retractor was placed.  The liver was mildly enlarged and steatotic.  The falciform ligament was further taken down off the abdominal wall.  The right triangular ligament was taken down with cautery, taking care not to injure the diaphragm.  This allowed the right lobe of the liver to be mobilized and rotated medially.  A nodule was palpable posteriorly in segment 7, corresponding to the location noted on preoperative imaging.  An intraoperative ultrasound was performed of the liver and no other suspicious lesions were identified.  Bimanual palpation of both lobes of the liver was performed and no other masses were palpated.  The right lobe was manually retracted medially to expose the posterior portion.  Bleeding was noted from a small branch vessel between the IVC and the liver, and this was repaired with a 3-0 Prolene figure-of-eight suture.  An ultrasound probe was placed over the mass and confirmed a depth of about 2 cm.  A gross margin of tissue was marked out around the mass and a 0 chromic stay suture was placed in the planned specimen.  A wedge resection of the mass was then performed using a crush clamp technique to divide the liver parenchyma.  Hemostasis was maintained with the aquamantys, and small vessels  were clipped prior to division.  There was bleeding from a hepatic vein branch adjacent to the mass, which was controlled with a Prolene suture.  The specimen was completely  excised and examined.  The entire nodule was palpable within the specimen with grossly negative margins.  The deep margin was marked and the specimen was sent for routine pathology.  Manual pressure was held at the resection site for several minutes to achieve hemostasis.  There was still a small amount of oozing on the cut surface of the liver, which was controlled with argon plasma coagulation and the aquamantys.  Manual pressure was again held for several minutes, and the resection bed was hemostatic.  The posterior branch vessel that had been repaired adjacent to the IVC was visualized and was hemostatic.  A Valsalva maneuver was performed and there was no bleeding near the IVC or at the liver resection site.  Arista was applied to the liver resection site and the right lobe was placed back into its proper anatomic position.  The Thompson retractor was removed.  The fascia was closed in 2 layers with a running looped 1 PDS suture.  Scarpa's layer was closed with a running 3-0 Vicryl suture and the skin was closed with a running subcuticular 4-0 Monocryl suture.  Dermabond was applied.  The patient tolerated the procedure well with no apparent complications.  All counts were correct x2 at the end of the procedure. The patient was extubated and taken to PACU in stable condition.  Michaelle Birks, MD 03/10/22 12:43 PM

## 2022-03-10 NOTE — Interval H&P Note (Signed)
History and Physical Interval Note:  03/10/2022 8:47 AM  [name] [name]  has presented today for surgery, with the diagnosis of METASTATIC COLON CANCER.  The various methods of treatment have been discussed with the patient and family. After consideration of risks, benefits and other options for treatment, the patient has consented to  Procedure(s): OPEN PARTIAL HEPATECTOMY (N/A) STAGING LAPAROSCOPY (N/A) as a surgical intervention.  The patient's history has been reviewed, patient examined, no change in status, stable for surgery.  I have reviewed the patient's chart and labs.  Questions were answered to the patient's satisfaction.  Admit to inpatient postoperatively.   Dwan Bolt

## 2022-03-10 NOTE — TOC Progression Note (Signed)
Transition of Care [facility]) - Progression Note    Patient Details  Name: [name] [name] MRN: [id number] Date of Birth: 13-Jul-1960  Transition of Care [facility]) CM/SW [place], RN Phone Number:[phone]  03/10/2022, 4:28 PM  Clinical Narrative:     Transition of Care (TOC) Screening Note   Patient Details  Name: [name] [name] Date of Birth: 07-11-1960   [facility]) CM/SW Contact:    Angelita Ingles, RN Phone Number: 03/10/2022, 4:28 PM    Transition of Care Department (TOC) has reviewed patient and no TOC needs have been identified at this time. We will continue to monitor patient advancement through interdisciplinary progression rounds. If new patient transition needs arise, please place a TOC consult.          Expected Discharge Plan and Services                                                 Social Determinants of Health (SDOH) Interventions    Readmission Risk Interventions     No data to display

## 2022-03-10 NOTE — Anesthesia Postprocedure Evaluation (Signed)
Anesthesia Post Note  Patient: [name] [name]  Procedure(s) Performed: OPEN PARTIAL HEPATECTOMY (Abdomen) STAGING LAPAROSCOPY (Abdomen)     Patient location during evaluation: PACU Anesthesia Type: General Level of consciousness: awake and alert Pain management: pain level controlled Vital Signs Assessment: post-procedure vital signs reviewed and stable Respiratory status: spontaneous breathing, nonlabored ventilation and respiratory function stable Cardiovascular status: blood pressure returned to baseline and stable Postop Assessment: no apparent nausea or vomiting Anesthetic complications: no   No notable events documented.  Last Vitals:  Vitals:   03/10/22 1300 03/10/22 1315  BP: 131/82 135/77  Pulse: (!) 115 (!) 114  Resp: 16 17  Temp:    SpO2: 96% 95%    Last Pain:  Vitals:   03/10/22 1300  TempSrc:   PainSc: [place]

## 2022-03-10 NOTE — Anesthesia Procedure Notes (Signed)
Procedure Name: Intubation Date/Time: 03/10/2022 9:40 AM  Performed by: Lance Coon, CRNAPre-anesthesia Checklist: Patient identified, Emergency Drugs available, Suction available, Patient being monitored and Timeout performed Patient Re-evaluated:Patient Re-evaluated prior to induction Oxygen Delivery Method: Circle system utilized Preoxygenation: Pre-oxygenation with 100% oxygen Induction Type: IV induction Ventilation: Mask ventilation without difficulty Laryngoscope Size: Miller and 3 Grade View: Grade I Tube type: Oral Tube size: 7.5 mm Number of attempts: 1 Airway Equipment and Method: Stylet Placement Confirmation: ETT inserted through vocal cords under direct vision, positive ETCO2 and breath sounds checked- equal and bilateral Secured at: 22 cm Tube secured with: Tape Dental Injury: Teeth and Oropharynx as per pre-operative assessment

## 2022-03-11 LAB — COMPREHENSIVE METABOLIC PANEL
ALT: 372 U/L — ABNORMAL HIGH (ref 0–44)
AST: 313 U/L — ABNORMAL HIGH (ref 15–41)
Albumin: 3.7 g/dL (ref 3.5–5.0)
Alkaline Phosphatase: 55 U/L (ref 38–126)
Anion gap: 8 (ref 5–15)
BUN: 12 mg/dL (ref 8–23)
CO2: 23 mmol/L (ref 22–32)
Calcium: 8.7 mg/dL — ABNORMAL LOW (ref 8.9–10.3)
Chloride: 103 mmol/L (ref 98–111)
Creatinine, Ser: 1.02 mg/dL (ref 0.61–1.24)
GFR, Estimated: >60 mL/min (ref >60)
Glucose, Bld: 201 mg/dL — ABNORMAL HIGH (ref 70–99)
Potassium: 4.5 mmol/L (ref 3.5–5.1)
Sodium: 134 mmol/L — ABNORMAL LOW (ref 135–145)
Total Bilirubin: 0.4 mg/dL (ref 0.3–1.2)
Total Protein: 5.6 g/dL — ABNORMAL LOW (ref 6.5–8.1)

## 2022-03-11 LAB — CBC
HCT: 35.2 % — ABNORMAL LOW (ref 39.0–52.0)
Hemoglobin: 12.3 g/dL — ABNORMAL LOW (ref 13.0–17.0)
MCH: 30.8 pg (ref 26.0–34.0)
MCHC: 34.9 g/dL (ref 30.0–36.0)
MCV: 88 fL (ref 80.0–100.0)
Platelets: 158 10*3/uL (ref 150–400)
RBC: 4 MIL/uL — ABNORMAL LOW (ref 4.22–5.81)
RDW: 15.1 % (ref 11.5–15.5)
WBC: 10.5 10*3/uL (ref 4.0–10.5)
nRBC: 0 % (ref 0.0–0.2)

## 2022-03-11 LAB — GLUCOSE, CAPILLARY
Glucose-Capillary: 207 mg/dL — ABNORMAL HIGH (ref 70–99)
Glucose-Capillary: 203 mg/dL — ABNORMAL HIGH (ref 70–99)
Glucose-Capillary: 209 mg/dL — ABNORMAL HIGH (ref 70–99)
Glucose-Capillary: 280 mg/dL — ABNORMAL HIGH (ref 70–99)
Glucose-Capillary: 229 mg/dL — ABNORMAL HIGH (ref 70–99)

## 2022-03-11 MED ORDER — INSULIN GLARGINE-YFGN 100 UNIT/ML ~~LOC~~ SOLN
5.0000 [IU] | Freq: Every day | SUBCUTANEOUS | Status: DC
  Administered 2022-03-11 – 2022-03-12 (×2): 5 [IU] via SUBCUTANEOUS
  Filled 2022-03-11 (×2): qty 0.05

## 2022-03-11 NOTE — Progress Notes (Signed)
1 Day Post-Op   Subjective/Chief Complaint: No n/v Tolerated clears +flatus Pain very well controlled. Nothing like the pain I had with back surgery Family at BS Some discoloration around incision that wasn't there yesterday   Objective: Vital signs in last 24 hours: Temp:  [97.6 F (36.4 C)-99.2 F (37.3 C)] 99.2 F (37.3 C) (09/16 0300) Pulse Rate:  [88-120] 88 (09/16 0300) Resp:  [14-21] 18 (09/16 0300) BP: (122-154)/(69-88) 142/88 (09/16 0300) SpO2:  [90 %-96 %] 90 % (09/16 0300) Arterial Line BP: (105-128)/(55-63) 120/60 (09/15 1415) Last BM Date : 03/10/22  Intake/Output from previous day: 09/15 0701 - 09/16 0700 In: 4553.6 [I.V.:3693.6; IV Piggyback:860] Out: 3175 [Urine:2375; Blood:800] Intake/Output this shift: No intake/output data recorded.  Alert, nontoxic Cta b/l, IS 1600 Reg Soft, protuberant abd, incision - some discoloration on skin along edges - doesn't blanch - looks more like early bruising No edema, +SCDs   Lab Results:  Recent Labs    03/10/22 1300 03/11/22 0131  WBC 17.4* 10.5  HGB 13.3 12.3*  HCT 39.6 35.2*  PLT 174 158   BMET Recent Labs    03/10/22 1300 03/11/22 0131  NA 139 134*  K 3.6 4.5  CL 109 103  CO2 19* 23  GLUCOSE 245* 201*  BUN 13 12  CREATININE 1.00 1.02  CALCIUM 8.4* 8.7*      Latest Ref Rng & Units 03/11/2022    1:31 AM 03/10/2022    1:00 PM 02/08/2022    9:25 AM  Hepatic Function  Total Protein 6.5 - 8.1 g/dL 5.6  5.9  6.9   Albumin 3.5 - 5.0 g/dL 3.7  4.0  4.2   AST 15 - 41 U/L 313  594  19   ALT 0 - 44 U/L 372  479  28   Alk Phosphatase 38 - 126 U/L 55  62  77   Total Bilirubin 0.3 - 1.2 mg/dL 0.4  0.4  0.6     PT/INR No results for input(s): "LABPROT", "INR" in the last 72 hours. ABG No results for input(s): "PHART", "HCO3" in the last 72 hours.  Invalid input(s): "PCO2", "PO2"  Studies/Results: No results found.  Anti-infectives: Anti-infectives (From admission, onward)    Start      Dose/Rate Route Frequency Ordered Stop   03/10/22 0730  ceFAZolin (ANCEF) IVPB 3g/100 mL premix        3 g 200 mL/hr over 30 Minutes Intravenous On call to O.R. 03/10/22 0720 03/10/22 0948       Assessment/Plan: s/p Procedure(s): OPEN PARTIAL HEPATECTOMY (N/A) STAGING LAPAROSCOPY (N/A) Dr Allen  Pulm - wean O2, pt given IS and can pull 1600 FEN- KVO IVF, adv diet to carb modified Endo - DM2, hyperglycemia, cont SSI, add low dose long acting insulin Elevated Transaminases - trending down, monitor VTE prophylaxis - scds, lovenox D/c foley Incision - monitor   Eric M. Wilson, MD, FACS General, Bariatric, & Minimally Invasive Surgery [facility],  [facility]   LOS: 1 day    Eric Wilson 03/11/2022  

## 2022-03-11 NOTE — Evaluation (Signed)
Physical Therapy Evaluation & Discharge Patient Details Name: [name] [name] MRN: [id number] DOB: 28-Oct-1960 Today's Date: 03/11/2022  History of Present Illness  Pt is a 61 y.o. male who presented 03/10/22 for partial right hepatectomy (metastasectomy of segment 7 tumor) and staging laparoscopy. PMH: rectal cancer, asthma, DM, GERD, migraines, back surgery   Clinical Impression  Pt presents with condition above. He appears and reports to be at his baseline, performing all functional mobility safely with no overt LOB and no need for UE support or assistance, including navigating a flight of stairs. Educated pt on use of incentive spirometer and walking halls several times a day to improve pulmonary function and reduce risk of deconditioning. Educated pt on techniques to reduce his pain with mobility. All education completed and questions answered. PT will sign off.     Recommendations for follow up therapy are one component of a multi-disciplinary discharge planning process, led by the attending physician.  Recommendations may be updated based on patient status, additional functional criteria and insurance authorization.  Follow Up Recommendations No PT follow up      Assistance Recommended at Discharge PRN  Patient can return home with the following       Equipment Recommendations None recommended by PT  Recommendations for Other Services       Functional Status Assessment Patient has not had a recent decline in their functional status     Precautions / Restrictions Precautions Precautions: Other (comment) Precaution Comments: abdominal for comfort Restrictions Weight Bearing Restrictions: No      Mobility  Bed Mobility Overal bed mobility: Modified Independent             General bed mobility comments: Cues to log roll and enter/exit bed on his L for comfort. Able to demonstrate understanding by performing sidelying <> sit and rolling in bed from L bed access  without assistance, bed flat.    Transfers Overall transfer level: Independent Equipment used: None               General transfer comment: Able to come to stand safely without LOB    Ambulation/Gait Ambulation/Gait assistance: Independent Gait Distance (Feet): 260 Feet Assistive device: None Gait Pattern/deviations: WFL(Within Functional Limits) Gait velocity: WNL Gait velocity interpretation: >4.37 ft/sec, indicative of normal walking speed   General Gait Details: Pt able to ambulate without LOB or need for assistance, even when cued to change directions, speeds, and head position.  Stairs Stairs: Yes Stairs assistance: Independent Stair Management: One rail Left, No rails, Alternating pattern, Forwards Number of Stairs: 10 General stair comments: Ascended with L rail but did not appear to need it, thus cued pt to not use rail when descending. no LOB  Wheelchair Mobility    Modified Rankin (Stroke Patients Only)       Balance Overall balance assessment: No apparent balance deficits (not formally assessed)                               Standardized Balance Assessment Standardized Balance Assessment : Dynamic Gait Index   Dynamic Gait Index Level Surface: Normal Change in Gait Speed: Normal Gait with Horizontal Head Turns: Normal Gait with Vertical Head Turns: Normal Gait and Pivot Turn: Normal Step Over Obstacle: Normal Step Around Obstacles: Normal Steps: Normal Total Score: 24       Pertinent Vitals/Pain Pain Assessment Pain Assessment: Faces Faces Pain Scale: Hurts a little bit Pain Location: abdomen, incision  site Pain Descriptors / Indicators: Discomfort, Operative site guarding Pain Intervention(s): Limited activity within patient's tolerance, Monitored during session, Repositioned    Home Living Family/patient expects to be discharged to:: Private residence Living Arrangements: Spouse/significant other Available Help at  Discharge: Family Type of Home: House Home Access: Stairs to enter Entrance Stairs-Rails:  (anterior rail) Entrance Stairs-Number of Steps: 2 Alternate Level Stairs-Number of Steps: flight Home Layout: Multi-level;Able to live on main level with bedroom/bathroom Home Equipment: Grab bars - tub/shower;Grab bars - toilet;Shower seat      Prior Function Prior Level of Function : Independent/Modified Independent;Driving;Working/employed               ADLs Comments: Works in [employer]        Extremity/Trunk Assessment   Upper Extremity Assessment Upper Extremity Assessment: Overall WFL for tasks assessed    Lower Extremity Assessment Lower Extremity Assessment: Overall WFL for tasks assessed    Cervical / Trunk Assessment Cervical / Trunk Assessment: Other exceptions Cervical / Trunk Exceptions: abdominal surgery  Communication   Communication: No difficulties  Cognition Arousal/Alertness: Awake/alert Behavior During Therapy: WFL for tasks assessed/performed Overall Cognitive Status: Within Functional Limits for tasks assessed                                 General Comments: follows commands appropriately        General Comments      Exercises     Assessment/Plan    PT Assessment Patient does not need any further PT services  PT Problem List         PT Treatment Interventions      PT Goals (Current goals can be found in the Care Plan section)  Acute Rehab PT Goals Patient Stated Goal: to go home PT Goal Formulation: All assessment and education complete, DC therapy Time For Goal Achievement: 03/12/22 Potential to Achieve Goals: Good    Frequency       Co-evaluation               AM-PAC PT "6 Clicks" Mobility  Outcome Measure Help needed turning from your back to your side while in a flat bed without using bedrails?: None Help needed moving from lying on your back to sitting on the side of a flat bed without  using bedrails?: None Help needed moving to and from a bed to a chair (including a wheelchair)?: None Help needed standing up from a chair using your arms (e.g., wheelchair or bedside chair)?: None Help needed to walk in hospital room?: None Help needed climbing 3-5 steps with a railing? : None 6 Click Score: 24    End of Session   Activity Tolerance: Patient tolerated treatment well Patient left: in bed;with family/visitor present Nurse Communication: Mobility status PT Visit Diagnosis: Pain Pain - Right/Left: Right Pain - part of body:  (abdomen)    Time: 0923-3007 PT Time Calculation (min) (ACUTE ONLY): 8 min   Charges:   PT Evaluation $PT Eval Low Complexity: 1 Low          Moishe Spice, PT, DPT Acute Rehabilitation Services  Office: [phone]   Orvan Falconer 03/11/2022, 1:16 PM

## 2022-03-12 LAB — CBC
HCT: 34.4 % — ABNORMAL LOW (ref 39.0–52.0)
Hemoglobin: 11.4 g/dL — ABNORMAL LOW (ref 13.0–17.0)
MCH: 30.1 pg (ref 26.0–34.0)
MCHC: 33.1 g/dL (ref 30.0–36.0)
MCV: 90.8 fL (ref 80.0–100.0)
Platelets: 129 10*3/uL — ABNORMAL LOW (ref 150–400)
RBC: 3.79 MIL/uL — ABNORMAL LOW (ref 4.22–5.81)
RDW: 15.4 % (ref 11.5–15.5)
WBC: 7.7 10*3/uL (ref 4.0–10.5)
nRBC: 0 % (ref 0.0–0.2)

## 2022-03-12 LAB — COMPREHENSIVE METABOLIC PANEL
ALT: 260 U/L — ABNORMAL HIGH (ref 0–44)
AST: 116 U/L — ABNORMAL HIGH (ref 15–41)
Albumin: 3.5 g/dL (ref 3.5–5.0)
Alkaline Phosphatase: 53 U/L (ref 38–126)
Anion gap: 9 (ref 5–15)
BUN: 10 mg/dL (ref 8–23)
CO2: 24 mmol/L (ref 22–32)
Calcium: 8.4 mg/dL — ABNORMAL LOW (ref 8.9–10.3)
Chloride: 105 mmol/L (ref 98–111)
Creatinine, Ser: 1.14 mg/dL (ref 0.61–1.24)
GFR, Estimated: >60 mL/min (ref >60)
Glucose, Bld: 208 mg/dL — ABNORMAL HIGH (ref 70–99)
Potassium: 4.1 mmol/L (ref 3.5–5.1)
Sodium: 138 mmol/L (ref 135–145)
Total Bilirubin: 0.5 mg/dL (ref 0.3–1.2)
Total Protein: 5.8 g/dL — ABNORMAL LOW (ref 6.5–8.1)

## 2022-03-12 LAB — GLUCOSE, CAPILLARY
Glucose-Capillary: 193 mg/dL — ABNORMAL HIGH (ref 70–99)
Glucose-Capillary: 202 mg/dL — ABNORMAL HIGH (ref 70–99)
Glucose-Capillary: 219 mg/dL — ABNORMAL HIGH (ref 70–99)

## 2022-03-12 MED ORDER — OXYCODONE HCL 5 MG PO TABS: 5.0000 mg | ORAL_TABLET | Freq: Four times a day (QID) | ORAL | 0 refills | Status: DC | PRN

## 2022-03-12 NOTE — Discharge Summary (Signed)
Physician Discharge Summary  Patient ID: [name] [name] MRN: [id number] DOB/AGE: 1960/09/17 61 y.o.  PCP: Sharion Balloon, FNP  Admit date: 03/10/2022 Discharge date: 03/12/2022  Admission Diagnoses:  metastatic rectal cancer post chemotherapy  Discharge Diagnoses:  same path pending  Principal Problem:   Rectal cancer [facility] Active Problems:   Rectal adenocarcinoma metastatic to liver [facility])   Surgery:  Procedure: Staging laparoscopy Partial right hepatectomy (metastasectomy of segment 7 tumor) Intraoperative ultrasound  Discharged Condition: Pain well controlled and patient asking to go home  Hospital Course:   Partial right hepatectomy was performed on Friday through a large subcostal incision.  He did very well regarding pain control.  His incision was intact with Dermabond on the skin and no erythema noted.  His Hg has been stable and was 11.4 today.  He was eager to go home as he has been eating a regular diet since yesterday.    Consults: none  Significant Diagnostic Studies: path pending    Discharge Exam: Blood pressure 128/83, pulse 92, temperature 97.6 F (36.4 C), temperature source Oral, resp. rate 20, height 6' (1.829 m), weight 119.7 kg, SpO2 93 %. Incision is not red and is closed with Dermabond.  He is having no appreciable pain  Disposition: Discharge disposition: 01-Home or Self Care       Discharge Instructions     Call MD for:  redness, tenderness, or signs of infection (pain, swelling, redness, odor or green/yellow discharge around incision site)   Complete by: As directed    Call MD for:  severe uncontrolled pain   Complete by: As directed    Diet - low sodium heart healthy   Complete by: As directed    Discharge instructions   Complete by: As directed    You may shower at home   Increase activity slowly   Complete by: As directed    No dressing needed   Complete by: As directed       Allergies as of 03/12/2022       Reactions    Other Swelling   Hayfever,GRASS,DOGS,CATS        Medication List     STOP taking these medications    dexamethasone 4 MG tablet Commonly known as: DECADRON   lidocaine-prilocaine cream Commonly known as: EMLA   naproxen sodium 220 MG tablet Commonly known as: ALEVE   prochlorperazine 10 MG tablet Commonly known as: COMPAZINE   sorbitol 70 % solution       TAKE these medications    albuterol 108 (90 Base) MCG/ACT inhaler Commonly known as: VENTOLIN HFA Inhale 2 puffs into the lungs every 6 (six) hours as needed for wheezing or shortness of breath.   empagliflozin 25 MG Tabs tablet Commonly known as: Jardiance Take 1 tablet (25 mg total) by mouth daily before breakfast.   fluticasone 50 MCG/ACT nasal spray Commonly known as: FLONASE Place 2 sprays into both nostrils daily. What changed:  when to take this reasons to take this   FreeStyle Libre 3 Sensor Misc Place 1 sensor on the skin every 14 days. Use to check glucose continuously.  DX: E11.65   insulin lispro 100 UNIT/ML KwikPen Commonly known as: HumaLOG KwikPen Inject 8 Units into the skin 3 (three) times daily.   Insulin Pen Needle 29G X 5MM Misc 1 Application by Does not apply route at bedtime.   LORazepam 0.5 MG tablet Commonly known as: ATIVAN Place 1 tablet (0.5 mg total) under the tongue every 6 (six)  hours as needed (prn nausea).   magic mouthwash Soln Take 5 mLs by mouth 4 (four) times daily as needed for mouth pain (swish and spit).   metFORMIN 500 MG tablet Commonly known as: GLUCOPHAGE Take 1 tablet (500 mg total) by mouth 2 (two) times daily with a meal.   montelukast 10 MG tablet Commonly known as: SINGULAIR Take 1 tablet (10 mg total) by mouth at bedtime.   omeprazole 40 MG capsule Commonly known as: PRILOSEC Take 1 capsule (40 mg total) by mouth daily as needed. For acid reflux   ondansetron 8 MG tablet Commonly known as: ZOFRAN Take 1 tablet (8 mg total) by mouth every 8  (eight) hours as needed for nausea or vomiting.   oxyCODONE 5 MG immediate release tablet Commonly known as: Oxy IR/ROXICODONE Take 1 tablet (5 mg total) by mouth every 6 (six) hours as needed for severe pain ('5mg'$  for moderate pain, '10mg'$  for severe pain).   rosuvastatin 10 MG tablet Commonly known as: Crestor Take 1 tablet (10 mg total) by mouth daily.   tamsulosin 0.4 MG Caps capsule Commonly known as: FLOMAX Take 2 capsules (0.8 mg total) by mouth daily.   Toujeo SoloStar 300 UNIT/ML Solostar Pen Generic drug: insulin glargine (1 Unit Dial) Inject 30 Units into the skin at bedtime.   Trulicity 1.5 RX/4.5OP Sopn Generic drug: Dulaglutide Inject 1.5 mg into the skin once a week. What changed: when to take this               Discharge Care Instructions  (From admission, onward)           Start     Ordered   03/12/22 0000  No dressing needed        03/12/22 1119            Follow-up Information     Dwan Bolt, MD Follow up in 2 week(s).   Specialty: General Surgery Why: For wound re-check Contact information: [address] [place] [address] [phone]                 Signed: Pedro Earls 03/12/2022, 11:21 AM

## 2022-03-13 ENCOUNTER — Encounter (HOSPITAL_COMMUNITY): Payer: Self-pay | Admitting: Surgery

## 2022-03-14 LAB — TYPE AND SCREEN
ABO/RH(D): O NEG
Antibody Screen: NEGATIVE
Unit division: 0
Unit division: 0
Unit division: 0
Unit division: 0

## 2022-03-14 LAB — BPAM RBC
Blood Product Expiration Date: 202309242359
Blood Product Expiration Date: 202309262359
Blood Product Expiration Date: 202309272359
Blood Product Expiration Date: 202309272359
Unit Type and Rh: 9500
Unit Type and Rh: 9500
Unit Type and Rh: 9500
Unit Type and Rh: 9500

## 2022-03-16 LAB — SURGICAL PATHOLOGY

## 2022-03-28 ENCOUNTER — Telehealth: Payer: Self-pay

## 2022-03-28 NOTE — Telephone Encounter (Signed)
Patient's wife called in stated [name] [name] is very sick and she want to cancel his appointment with Dr. Benay Spice. I advice the patient and his wife they need to come for his appointment. It's best to come in to get check out: he need to still hydrate, imodium,  and zofran if needed. Patient symptoms are body aches but no fever, diarrhea, vomiting, and weakness. Patient will be in as schedule. If the symptoms worsen give [place] a call back. Patient gave verbal understanding and had no further questions or concerns at this time.

## 2022-03-29 ENCOUNTER — Inpatient Hospital Stay: Payer: Commercial Managed Care - PPO | Attending: Oncology | Admitting: Oncology

## 2022-03-29 VITALS — BP 109/68 | HR 100 | Temp 98.1°F | Resp 18 | Ht 72.0 in | Wt 251.6 lb

## 2022-03-29 DIAGNOSIS — M549 Dorsalgia, unspecified: Secondary | ICD-10-CM | POA: Insufficient documentation

## 2022-03-29 DIAGNOSIS — C787 Secondary malignant neoplasm of liver and intrahepatic bile duct: Secondary | ICD-10-CM | POA: Insufficient documentation

## 2022-03-29 DIAGNOSIS — C20 Malignant neoplasm of rectum: Secondary | ICD-10-CM

## 2022-03-29 DIAGNOSIS — G8929 Other chronic pain: Secondary | ICD-10-CM | POA: Insufficient documentation

## 2022-03-29 DIAGNOSIS — Z23 Encounter for immunization: Secondary | ICD-10-CM | POA: Diagnosis not present

## 2022-03-29 DIAGNOSIS — Z803 Family history of malignant neoplasm of breast: Secondary | ICD-10-CM | POA: Diagnosis not present

## 2022-03-29 DIAGNOSIS — Z800 Family history of malignant neoplasm of digestive organs: Secondary | ICD-10-CM | POA: Diagnosis not present

## 2022-03-29 DIAGNOSIS — C19 Malignant neoplasm of rectosigmoid junction: Secondary | ICD-10-CM | POA: Insufficient documentation

## 2022-03-29 DIAGNOSIS — R197 Diarrhea, unspecified: Secondary | ICD-10-CM | POA: Diagnosis not present

## 2022-03-29 DIAGNOSIS — Z5111 Encounter for antineoplastic chemotherapy: Secondary | ICD-10-CM | POA: Insufficient documentation
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
## 2022-03-30 ENCOUNTER — Other Ambulatory Visit: Payer: Self-pay | Admitting: Oncology

## 2022-03-30 ENCOUNTER — Telehealth: Payer: Self-pay | Admitting: Family

## 2022-03-30 DIAGNOSIS — N401 Enlarged prostate with lower urinary tract symptoms: Secondary | ICD-10-CM

## 2022-03-30 DIAGNOSIS — E1165 Type 2 diabetes mellitus with hyperglycemia: Secondary | ICD-10-CM

## 2022-03-30 DIAGNOSIS — R3914 Feeling of incomplete bladder emptying: Secondary | ICD-10-CM

## 2022-03-30 DIAGNOSIS — J301 Allergic rhinitis due to pollen: Secondary | ICD-10-CM

## 2022-03-30 NOTE — Telephone Encounter (Signed)
  Prescription Request  03/30/2022   What is the name of the medication or equipment? METFORMIN, SINGULAIR, and TAMSULOSIN  Have you contacted your pharmacy to request a refill? YES  Which pharmacy would you like this sent to? [facility]  Pt had his 6 month check up with PCP on 02/09/2022 and no refills were sent in for these meds because pt didn't more refills at that time but is out now and needs refills.

## 2022-03-31 ENCOUNTER — Encounter: Payer: Self-pay | Admitting: Oncology

## 2022-03-31 MED ORDER — METFORMIN HCL 500 MG PO TABS: 500.0000 mg | ORAL_TABLET | Freq: Two times a day (BID) | ORAL | 0 refills | Status: DC

## 2022-03-31 MED ORDER — TAMSULOSIN HCL 0.4 MG PO CAPS: 0.8000 mg | ORAL_CAPSULE | Freq: Every day | ORAL | 0 refills | Status: DC

## 2022-03-31 MED ORDER — MONTELUKAST SODIUM 10 MG PO TABS: 10.0000 mg | ORAL_TABLET | Freq: Every day | ORAL | 1 refills | Status: DC

## 2022-03-31 NOTE — Telephone Encounter (Signed)
Refills sent

## 2022-04-04 DIAGNOSIS — E1165 Type 2 diabetes mellitus with hyperglycemia: Secondary | ICD-10-CM

## 2022-04-05 MED ORDER — EMPAGLIFLOZIN 25 MG PO TABS: 25.0000 mg | ORAL_TABLET | Freq: Every day | ORAL | 0 refills | Status: DC

## 2022-04-09 ENCOUNTER — Other Ambulatory Visit: Payer: Self-pay | Admitting: Oncology

## 2022-04-11 ENCOUNTER — Inpatient Hospital Stay: Payer: Commercial Managed Care - PPO

## 2022-04-11 ENCOUNTER — Inpatient Hospital Stay (HOSPITAL_BASED_OUTPATIENT_CLINIC_OR_DEPARTMENT_OTHER): Payer: Commercial Managed Care - PPO | Admitting: Oncology

## 2022-04-11 ENCOUNTER — Other Ambulatory Visit: Payer: Self-pay | Admitting: *Deleted

## 2022-04-11 ENCOUNTER — Other Ambulatory Visit: Payer: Self-pay

## 2022-04-11 DIAGNOSIS — Z23 Encounter for immunization: Secondary | ICD-10-CM

## 2022-04-11 DIAGNOSIS — Z5111 Encounter for antineoplastic chemotherapy: Secondary | ICD-10-CM | POA: Diagnosis not present

## 2022-04-11 DIAGNOSIS — C20 Malignant neoplasm of rectum: Secondary | ICD-10-CM

## 2022-04-11 LAB — CBC WITH DIFFERENTIAL (CANCER CENTER ONLY)
Abs Immature Granulocytes: 0.02 10*3/uL (ref 0.00–0.07)
Basophils Absolute: 0 10*3/uL (ref 0.0–0.1)
Basophils Relative: 1 %
Eosinophils Absolute: 0.1 10*3/uL (ref 0.0–0.5)
Eosinophils Relative: 2 %
HCT: 42 % (ref 39.0–52.0)
Hemoglobin: 13.6 g/dL (ref 13.0–17.0)
Immature Granulocytes: 1 %
Lymphocytes Relative: 18 %
Lymphs Abs: 0.7 10*3/uL (ref 0.7–4.0)
MCH: 27.7 pg (ref 26.0–34.0)
MCHC: 32.4 g/dL (ref 30.0–36.0)
MCV: 85.5 fL (ref 80.0–100.0)
Monocytes Absolute: 0.5 10*3/uL (ref 0.1–1.0)
Monocytes Relative: 11 %
Neutro Abs: 2.9 10*3/uL (ref 1.7–7.7)
Neutrophils Relative %: 67 %
Platelet Count: 237 10*3/uL (ref 150–400)
RBC: 4.91 MIL/uL (ref 4.22–5.81)
RDW: 14.5 % (ref 11.5–15.5)
WBC Count: 4.2 10*3/uL (ref 4.0–10.5)
nRBC: 0 % (ref 0.0–0.2)

## 2022-04-11 LAB — CMP (CANCER CENTER ONLY)
ALT: 24 U/L (ref 0–44)
AST: 21 U/L (ref 15–41)
Albumin: 4.2 g/dL (ref 3.5–5.0)
Alkaline Phosphatase: 74 U/L (ref 38–126)
Anion gap: 11 (ref 5–15)
BUN: 12 mg/dL (ref 8–23)
CO2: 23 mmol/L (ref 22–32)
Calcium: 9.3 mg/dL (ref 8.9–10.3)
Chloride: 104 mmol/L (ref 98–111)
Creatinine: 0.97 mg/dL (ref 0.61–1.24)
GFR, Estimated: >60 mL/min (ref >60)
Glucose, Bld: 175 mg/dL — ABNORMAL HIGH (ref 70–99)
Potassium: 4.3 mmol/L (ref 3.5–5.1)
Sodium: 138 mmol/L (ref 135–145)
Total Bilirubin: 0.6 mg/dL (ref 0.3–1.2)
Total Protein: 6.9 g/dL (ref 6.5–8.1)

## 2022-04-11 LAB — MAGNESIUM: Magnesium: 2 mg/dL (ref 1.7–2.4)

## 2022-04-11 MED ORDER — INFLUENZA VAC SPLIT QUAD 0.5 ML IM SUSY: 0.5000 mL | PREFILLED_SYRINGE | Freq: Once | INTRAMUSCULAR | Status: DC

## 2022-04-11 MED ORDER — INFLUENZA VAC SPLIT QUAD 0.5 ML IM SUSY
0.5000 mL | PREFILLED_SYRINGE | Freq: Once | INTRAMUSCULAR | Status: AC
  Administered 2022-04-11: 0.5 mL via INTRAMUSCULAR
  Filled 2022-04-11: qty 0.5

## 2022-04-11 NOTE — Patient Instructions (Signed)
Influenza Virus Vaccine injection What is this medication? INFLUENZA VIRUS VACCINE (in floo EN zuh VAHY ruhs vak SEEN) helps to reduce the risk of getting influenza also known as the flu. The vaccine only helps protect you against some strains of the flu. This medicine may be used for other purposes; ask your health care provider or pharmacist if you have questions. COMMON BRAND NAME(S): Afluria, Afluria Quadrivalent, Agriflu, Alfuria, FLUAD, FLUAD Quadrivalent, Fluarix, Fluarix Quadrivalent, Flublok, Flublok Quadrivalent, FLUCELVAX, FLUCELVAX Quadrivalent, Flulaval, Flulaval Quadrivalent, Fluvirin, Fluzone, Fluzone High-Dose, Fluzone Intradermal, Fluzone Quadrivalent What should I tell my care team before I take this medication? They need to know if you have any of these conditions: bleeding disorder like hemophilia fever or infection Guillain-Barre syndrome or other neurological problems immune system problems infection with the human immunodeficiency virus (HIV) or AIDS low blood platelet counts multiple sclerosis an unusual or allergic reaction to influenza virus vaccine, latex, other medicines, foods, dyes, or preservatives. Different brands of vaccines contain different allergens. Some may contain latex or eggs. Talk to your doctor about your allergies to make sure that you get the right vaccine. pregnant or trying to get pregnant breast-feeding How should I use this medication? This vaccine is for injection into a muscle or under the skin. It is given by a health care professional. A copy of Vaccine Information Statements will be given before each vaccination. Read this sheet carefully each time. The sheet may change frequently. Talk to your healthcare provider to see which vaccines are right for you. Some vaccines should not be used in all age groups. Overdosage: If you think you have taken too much of this medicine contact a poison control center or emergency room at once. NOTE: This  medicine is only for you. Do not share this medicine with others. What if I miss a dose? This does not apply. What may interact with this medication? chemotherapy or radiation therapy medicines that lower your immune system like etanercept, anakinra, infliximab, and adalimumab medicines that treat or prevent blood clots like warfarin phenytoin steroid medicines like prednisone or cortisone theophylline vaccines This list may not describe all possible interactions. Give your health care provider a list of all the medicines, herbs, non-prescription drugs, or dietary supplements you use. Also tell them if you smoke, drink alcohol, or use illegal drugs. Some items may interact with your medicine. What should I watch for while using this medication? Report any side effects that do not go away within 3 days to your doctor or health care professional. Call your health care provider if any unusual symptoms occur within 6 weeks of receiving this vaccine. You may still catch the flu, but the illness is not usually as bad. You cannot get the flu from the vaccine. The vaccine will not protect against colds or other illnesses that may cause fever. The vaccine is needed every year. What side effects may I notice from receiving this medication? Side effects that you should report to your doctor or health care professional as soon as possible: allergic reactions like skin rash, itching or hives, swelling of the face, lips, or tongue Side effects that usually do not require medical attention (report to your doctor or health care professional if they continue or are bothersome): fever headache muscle aches and pains pain, tenderness, redness, or swelling at the injection site tiredness This list may not describe all possible side effects. Call your doctor for medical advice about side effects. You may report side effects to FDA at 1-800-FDA-1088.   Where should I keep my medication? The vaccine will be given  by a health care professional in a clinic, pharmacy, doctor's office, or other health care setting. You will not be given vaccine doses to store at home. NOTE: This sheet is a summary. It may not cover all possible information. If you have questions about this medicine, talk to your doctor, pharmacist, or health care provider.  2023 [employer]vier/Gold Standard (2021-01-14 00:00:00)  
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
## 2022-04-12 ENCOUNTER — Inpatient Hospital Stay: Payer: Commercial Managed Care - PPO

## 2022-04-12 VITALS — BP 125/79 | HR 87 | Temp 97.6°F | Resp 20

## 2022-04-12 DIAGNOSIS — C20 Malignant neoplasm of rectum: Secondary | ICD-10-CM

## 2022-04-12 DIAGNOSIS — Z5111 Encounter for antineoplastic chemotherapy: Secondary | ICD-10-CM | POA: Diagnosis not present

## 2022-04-12 MED ORDER — FLUOROURACIL CHEMO INJECTION 2.5 GM/50ML
300.0000 mg/m2 | Freq: Once | INTRAVENOUS | Status: AC
  Administered 2022-04-12: 700 mg via INTRAVENOUS
  Filled 2022-04-12: qty 14

## 2022-04-12 MED ORDER — SODIUM CHLORIDE 0.9 % IV SOLN
10.0000 mg | Freq: Once | INTRAVENOUS | Status: AC
  Administered 2022-04-12: 10 mg via INTRAVENOUS
  Filled 2022-04-12: qty 1

## 2022-04-12 MED ORDER — OXALIPLATIN CHEMO INJECTION 100 MG/20ML
150.0000 mg | Freq: Once | INTRAVENOUS | Status: AC
  Administered 2022-04-12: 150 mg via INTRAVENOUS
  Filled 2022-04-12: qty 10

## 2022-04-12 MED ORDER — SODIUM CHLORIDE 0.9 % IV SOLN
150.0000 mg | Freq: Once | INTRAVENOUS | Status: AC
  Administered 2022-04-12: 150 mg via INTRAVENOUS
  Filled 2022-04-12: qty 5

## 2022-04-12 MED ORDER — DEXTROSE 5 % IV SOLN: Freq: Once | INTRAVENOUS | Status: AC

## 2022-04-12 MED ORDER — LEUCOVORIN CALCIUM INJECTION 350 MG
300.0000 mg/m2 | Freq: Once | INTRAVENOUS | Status: AC
  Administered 2022-04-12: 724 mg via INTRAVENOUS
  Filled 2022-04-12: qty 25

## 2022-04-12 MED ORDER — PALONOSETRON HCL INJECTION 0.25 MG/5ML
0.2500 mg | Freq: Once | INTRAVENOUS | Status: AC
  Administered 2022-04-12: 0.25 mg via INTRAVENOUS
  Filled 2022-04-12: qty 5

## 2022-04-12 MED ORDER — SODIUM CHLORIDE 0.9 % IV SOLN
5000.0000 mg | INTRAVENOUS | Status: DC
  Administered 2022-04-12: 5000 mg via INTRAVENOUS
  Filled 2022-04-12: qty 100

## 2022-04-12 NOTE — Patient Instructions (Signed)
[facility]   Discharge Instructions: Thank you for choosing [facility] to provide your oncology and hematology care.   If you have a lab appointment with the [facility], please go directly to the [facility] and check in at the registration area.   Wear comfortable clothing and clothing appropriate for easy access to any Portacath or PICC line.   We strive to give you quality time with your provider. You may need to reschedule your appointment if you arrive late (15 or more minutes).  Arriving late affects you and other patients whose appointments are after yours.  Also, if you miss three or more appointments without notifying the office, you may be dismissed from the clinic at the provider's discretion.      For prescription refill requests, have your pharmacy contact our office and allow 72 hours for refills to be completed.    Today you received the following chemotherapy and/or immunotherapy agents Oxaliplatin (ELOXATIN), Leucovorin & Flourouracil (ADRUCIL).      To help prevent nausea and vomiting after your treatment, we encourage you to take your nausea medication as directed.  BELOW ARE SYMPTOMS THAT SHOULD BE REPORTED IMMEDIATELY: *FEVER GREATER THAN 100.4 F (38 C) OR HIGHER *CHILLS OR SWEATING *NAUSEA AND VOMITING THAT IS NOT CONTROLLED WITH YOUR NAUSEA MEDICATION *UNUSUAL SHORTNESS OF BREATH *UNUSUAL BRUISING OR BLEEDING *URINARY PROBLEMS (pain or burning when urinating, or frequent urination) *BOWEL PROBLEMS (unusual diarrhea, constipation, pain near the anus) TENDERNESS IN MOUTH AND THROAT WITH OR WITHOUT PRESENCE OF ULCERS (sore throat, sores in mouth, or a toothache) UNUSUAL RASH, SWELLING OR PAIN  UNUSUAL VAGINAL DISCHARGE OR ITCHING   Items with * indicate a potential emergency and should be followed up as soon as possible or go to the Emergency Department if any problems should occur.  Please show the CHEMOTHERAPY ALERT  CARD or IMMUNOTHERAPY ALERT CARD at check-in to the Emergency Department and triage nurse.  Should you have questions after your visit or need to cancel or reschedule your appointment, please contact [facility]  Dept: [phone]  and follow the prompts.  Office hours are 8:00 a.m. to 4:30 p.m. Monday - Friday. Please note that voicemails left after 4:00 p.m. may not be returned until the following business day.  We are closed weekends and major holidays. You have access to a nurse at all times for urgent questions. Please call the main number to the clinic Dept: [phone] and follow the prompts.   For any non-urgent questions, you may also contact your provider using MyChart. We now offer e-Visits for anyone 62 and older to request care online for non-urgent symptoms. For details visit [email or web address].   Also download the MyChart app! Go to the app store, search "MyChart", open the app, select , and log in with your MyChart username and password.  Masks are optional in the cancer centers. If you would like for your care team to wear a mask while they are taking care of you, please let them know. You may have one support person who is at least 62 years old accompany you for your appointments.  Oxaliplatin Injection What is this medication? OXALIPLATIN (ox AL i PLA tin) treats some types of cancer. It works by slowing down the growth of cancer cells. This medicine may be used for other purposes; ask your health care provider or pharmacist if you have questions. COMMON BRAND NAME(S): Eloxatin What should I tell my  care team before I take this medication? They need to know if you have any of these conditions: Heart disease History of irregular heartbeat or rhythm Liver disease Low blood cell levels (white cells, red cells, and platelets) Lung or breathing disease, such as asthma Take medications that treat or prevent blood clots Tingling of the  fingers, toes, or other nerve disorder An unusual or allergic reaction to oxaliplatin, other medications, foods, dyes, or preservatives If you or your partner are pregnant or trying to get pregnant Breast-feeding How should I use this medication? This medication is injected into a vein. It is given by your care team in a hospital or clinic setting. Talk to your care team about the use of this medication in children. Special care may be needed. Overdosage: If you think you have taken too much of this medicine contact a poison control center or emergency room at once. NOTE: This medicine is only for you. Do not share this medicine with others. What if I miss a dose? Keep appointments for follow-up doses. It is important not to miss a dose. Call your care team if you are unable to keep an appointment. What may interact with this medication? Do not take this medication with any of the following: Cisapride Dronedarone Pimozide Thioridazine This medication may also interact with the following: Aspirin and aspirin-like medications Certain medications that treat or prevent blood clots, such as warfarin, apixaban, dabigatran, and rivaroxaban Cisplatin Cyclosporine Diuretics Medications for infection, such as acyclovir, adefovir, amphotericin B, bacitracin, cidofovir, foscarnet, ganciclovir, gentamicin, pentamidine, vancomycin NSAIDs, medications for pain and inflammation, such as ibuprofen or naproxen Other medications that cause heart rhythm changes Pamidronate Zoledronic acid This list may not describe all possible interactions. Give your health care provider a list of all the medicines, herbs, non-prescription drugs, or dietary supplements you use. Also tell them if you smoke, drink alcohol, or use illegal drugs. Some items may interact with your medicine. What should I watch for while using this medication? Your condition will be monitored carefully while you are receiving this  medication. You may need blood work while taking this medication. This medication may make you feel generally unwell. This is not uncommon as chemotherapy can affect healthy cells as well as cancer cells. Report any side effects. Continue your course of treatment even though you feel ill unless your care team tells you to stop. This medication may increase your risk of getting an infection. Call your care team for advice if you get a fever, chills, sore throat, or other symptoms of a cold or flu. Do not treat yourself. Try to avoid being around people who are sick. Avoid taking medications that contain aspirin, acetaminophen, ibuprofen, naproxen, or ketoprofen unless instructed by your care team. These medications may hide a fever. Be careful brushing or flossing your teeth or using a toothpick because you may get an infection or bleed more easily. If you have any dental work done, tell your dentist you are receiving this medication. This medication can make you more sensitive to cold. Do not drink cold drinks or use ice. Cover exposed skin before coming in contact with cold temperatures or cold objects. When out in cold weather wear warm clothing and cover your mouth and nose to warm the air that goes into your lungs. Tell your care team if you get sensitive to the cold. Talk to your care team if you or your partner are pregnant or think either of you might be pregnant. This medication can  cause serious birth defects if taken during pregnancy and for 9 months after the last dose. A negative pregnancy test is required before starting this medication. A reliable form of contraception is recommended while taking this medication and for 9 months after the last dose. Talk to your care team about effective forms of contraception. Do not father a child while taking this medication and for 6 months after the last dose. Use a condom while having sex during this time period. Do not breastfeed while taking this  medication and for 3 months after the last dose. This medication may cause infertility. Talk to your care team if you are concerned about your fertility. What side effects may I notice from receiving this medication? Side effects that you should report to your care team as soon as possible: Allergic reactions--skin rash, itching, hives, swelling of the face, lips, tongue, or throat Bleeding--bloody or black, tar-like stools, vomiting blood or brown material that looks like coffee grounds, red or dark brown urine, small red or purple spots on skin, unusual bruising or bleeding Dry cough, shortness of breath or trouble breathing Heart rhythm changes--fast or irregular heartbeat, dizziness, feeling faint or lightheaded, chest pain, trouble breathing Infection--fever, chills, cough, sore throat, wounds that don't heal, pain or trouble when passing urine, general feeling of discomfort or being unwell Liver injury--right upper belly pain, loss of appetite, nausea, light-colored stool, dark yellow or brown urine, yellowing skin or eyes, unusual weakness or fatigue Low red blood cell level--unusual weakness or fatigue, dizziness, headache, trouble breathing Muscle injury--unusual weakness or fatigue, muscle pain, dark yellow or brown urine, decrease in amount of urine Pain, tingling, or numbness in the hands or feet Sudden and severe headache, confusion, change in vision, seizures, which may be signs of posterior reversible encephalopathy syndrome (PRES) Unusual bruising or bleeding Side effects that usually do not require medical attention (report to your care team if they continue or are bothersome): Diarrhea Nausea Pain, redness, or swelling with sores inside the mouth or throat Unusual weakness or fatigue Vomiting This list may not describe all possible side effects. Call your doctor for medical advice about side effects. You may report side effects to FDA at 1-800-FDA-1088. Where should I keep my  medication? This medication is given in a hospital or clinic. It will not be stored at home. NOTE: This sheet is a summary. It may not cover all possible information. If you have questions about this medicine, talk to your doctor, pharmacist, or health care provider.  2023 Elsevier/Gold Standard (2021-10-07 00:00:00)  Leucovorin Injection What is this medication? LEUCOVORIN (loo koe VOR in) prevents side effects from certain medications, such as methotrexate. It works by increasing folate levels. This helps protect healthy cells in your body. It may also be used to treat anemia caused by low levels of folate. It can also be used with fluorouracil, a type of chemotherapy, to treat colorectal cancer. It works by increasing the effects of fluorouracil in the body. This medicine may be used for other purposes; ask your health care provider or pharmacist if you have questions. What should I tell my care team before I take this medication? They need to know if you have any of these conditions: Anemia from low levels of vitamin B12 in the blood An unusual or allergic reaction to leucovorin, folic acid, other medications, foods, dyes, or preservatives Pregnant or trying to get pregnant Breastfeeding How should I use this medication? This medication is injected into a vein or a  muscle. It is given by your care team in a hospital or clinic setting. Talk to your care team about the use of this medication in children. Special care may be needed. Overdosage: If you think you have taken too much of this medicine contact a poison control center or emergency room at once. NOTE: This medicine is only for you. Do not share this medicine with others. What if I miss a dose? Keep appointments for follow-up doses. It is important not to miss your dose. Call your care team if you are unable to keep an appointment. What may interact with this  medication? Capecitabine Fluorouracil Phenobarbital Phenytoin Primidone Trimethoprim;sulfamethoxazole This list may not describe all possible interactions. Give your health care provider a list of all the medicines, herbs, non-prescription drugs, or dietary supplements you use. Also tell them if you smoke, drink alcohol, or use illegal drugs. Some items may interact with your medicine. What should I watch for while using this medication? Your condition will be monitored carefully while you are receiving this medication. This medication may increase the side effects of 5-fluorouracil. Tell your care team if you have diarrhea or mouth sores that do not get better or that get worse. What side effects may I notice from receiving this medication? Side effects that you should report to your care team as soon as possible: Allergic reactions--skin rash, itching, hives, swelling of the face, lips, tongue, or throat This list may not describe all possible side effects. Call your doctor for medical advice about side effects. You may report side effects to FDA at 1-800-FDA-1088. Where should I keep my medication? This medication is given in a hospital or clinic. It will not be stored at home. NOTE: This sheet is a summary. It may not cover all possible information. If you have questions about this medicine, talk to your doctor, pharmacist, or health care provider.  2023 Elsevier/Gold Standard (2021-10-21 00:00:00)  Fluorouracil Injection What is this medication? FLUOROURACIL (flure oh YOOR a sil) treats some types of cancer. It works by slowing down the growth of cancer cells. This medicine may be used for other purposes; ask your health care provider or pharmacist if you have questions. COMMON BRAND NAME(S): Adrucil What should I tell my care team before I take this medication? They need to know if you have any of these conditions: Blood disorders Dihydropyrimidine dehydrogenase (DPD)  deficiency Infection, such as chickenpox, cold sores, herpes Kidney disease Liver disease Poor nutrition Recent or ongoing radiation therapy An unusual or allergic reaction to fluorouracil, other medications, foods, dyes, or preservatives If you or your partner are pregnant or trying to get pregnant Breast-feeding How should I use this medication? This medication is injected into a vein. It is administered by your care team in a hospital or clinic setting. Talk to your care team about the use of this medication in children. Special care may be needed. Overdosage: If you think you have taken too much of this medicine contact a poison control center or emergency room at once. NOTE: This medicine is only for you. Do not share this medicine with others. What if I miss a dose? Keep appointments for follow-up doses. It is important not to miss your dose. Call your care team if you are unable to keep an appointment. What may interact with this medication? Do not take this medication with any of the following: Live virus vaccines This medication may also interact with the following: Medications that treat or prevent blood clots, such  as warfarin, enoxaparin, dalteparin This list may not describe all possible interactions. Give your health care provider a list of all the medicines, herbs, non-prescription drugs, or dietary supplements you use. Also tell them if you smoke, drink alcohol, or use illegal drugs. Some items may interact with your medicine. What should I watch for while using this medication? Your condition will be monitored carefully while you are receiving this medication. This medication may make you feel generally unwell. This is not uncommon as chemotherapy can affect healthy cells as well as cancer cells. Report any side effects. Continue your course of treatment even though you feel ill unless your care team tells you to stop. In some cases, you may be given additional medications  to help with side effects. Follow all directions for their use. This medication may increase your risk of getting an infection. Call your care team for advice if you get a fever, chills, sore throat, or other symptoms of a cold or flu. Do not treat yourself. Try to avoid being around people who are sick. This medication may increase your risk to bruise or bleed. Call your care team if you notice any unusual bleeding. Be careful brushing or flossing your teeth or using a toothpick because you may get an infection or bleed more easily. If you have any dental work done, tell your dentist you are receiving this medication. Avoid taking medications that contain aspirin, acetaminophen, ibuprofen, naproxen, or ketoprofen unless instructed by your care team. These medications may hide a fever. Do not treat diarrhea with over the counter products. Contact your care team if you have diarrhea that lasts more than 2 days or if it is severe and watery. This medication can make you more sensitive to the sun. Keep out of the sun. If you cannot avoid being in the sun, wear protective clothing and sunscreen. Do not use sun lamps, tanning beds, or tanning booths. Talk to your care team if you or your partner wish to become pregnant or think you might be pregnant. This medication can cause serious birth defects if taken during pregnancy and for 3 months after the last dose. A reliable form of contraception is recommended while taking this medication and for 3 months after the last dose. Talk to your care team about effective forms of contraception. Do not father a child while taking this medication and for 3 months after the last dose. Use a condom while having sex during this time period. Do not breastfeed while taking this medication. This medication may cause infertility. Talk to your care team if you are concerned about your fertility. What side effects may I notice from receiving this medication? Side effects that you  should report to your care team as soon as possible: Allergic reactions--skin rash, itching, hives, swelling of the face, lips, tongue, or throat Heart attack--pain or tightness in the chest, shoulders, arms, or jaw, nausea, shortness of breath, cold or clammy skin, feeling faint or lightheaded Heart failure--shortness of breath, swelling of the ankles, feet, or hands, sudden weight gain, unusual weakness or fatigue Heart rhythm changes--fast or irregular heartbeat, dizziness, feeling faint or lightheaded, chest pain, trouble breathing High ammonia level--unusual weakness or fatigue, confusion, loss of appetite, nausea, vomiting, seizures Infection--fever, chills, cough, sore throat, wounds that don't heal, pain or trouble when passing urine, general feeling of discomfort or being unwell Low red blood cell level--unusual weakness or fatigue, dizziness, headache, trouble breathing Pain, tingling, or numbness in the hands or feet, muscle weakness,  change in vision, confusion or trouble speaking, loss of balance or coordination, trouble walking, seizures Redness, swelling, and blistering of the skin over hands and feet Severe or prolonged diarrhea Unusual bruising or bleeding Side effects that usually do not require medical attention (report to your care team if they continue or are bothersome): Dry skin Headache Increased tears Nausea Pain, redness, or swelling with sores inside the mouth or throat Sensitivity to light Vomiting This list may not describe all possible side effects. Call your doctor for medical advice about side effects. You may report side effects to FDA at 1-800-FDA-1088. Where should I keep my medication? This medication is given in a hospital or clinic. It will not be stored at home. NOTE: This sheet is a summary. It may not cover all possible information. If you have questions about this medicine, talk to your doctor, pharmacist, or health care provider.  2023 Elsevier/Gold  Standard (2021-10-18 00:00:00)  The chemotherapy medication bag should finish at 46 hours, 96 hours, or 7 days. For example, if your pump is scheduled for 46 hours and it was put on at 4:00 p.m., it should finish at 2:00 p.m. the day it is scheduled to come off regardless of your appointment time.     Estimated time to finish at 11:00 a.m. on Friday 04/14/2022.   If the display on your pump reads "Low Volume" and it is beeping, take the batteries out of the pump and come to the cancer center for it to be taken off.   If the pump alarms go off prior to the pump reading "Low Volume" then call [phone] and someone can assist you.  If the plunger comes out and the chemotherapy medication is leaking out, please use your home chemo spill kit to clean up the spill. Do NOT use paper towels or other household products.  If you have problems or questions regarding your pump, please call either [phone] (24 hours a day) or the cancer center Monday-Friday 8:00 a.m.- 4:30 p.m. at the clinic number and we will assist you. If you are unable to get assistance, then go to the nearest Emergency Department and ask the staff to contact the IV team for assistance.

## 2022-04-14 ENCOUNTER — Inpatient Hospital Stay: Payer: Commercial Managed Care - PPO

## 2022-04-14 VITALS — BP 114/75 | HR 90 | Temp 97.9°F | Resp 20

## 2022-04-14 DIAGNOSIS — C20 Malignant neoplasm of rectum: Secondary | ICD-10-CM

## 2022-04-14 DIAGNOSIS — Z5111 Encounter for antineoplastic chemotherapy: Secondary | ICD-10-CM | POA: Diagnosis not present

## 2022-04-14 MED ORDER — SODIUM CHLORIDE 0.9% FLUSH
10.0000 mL | INTRAVENOUS | Status: DC | PRN
  Administered 2022-04-14: 10 mL

## 2022-04-14 MED ORDER — HEPARIN SOD (PORK) LOCK FLUSH 100 UNIT/ML IV SOLN
500.0000 [IU] | Freq: Once | INTRAVENOUS | Status: AC | PRN
  Administered 2022-04-14: 500 [IU]

## 2022-04-14 NOTE — Patient Instructions (Signed)
Heparin injection What is this medication? HEPARIN (HEP a rin) is an anticoagulant. It is used to treat or prevent clots in the veins, arteries, lungs, or heart. It stops clots from forming or getting bigger. This medicine prevents clotting during open-heart surgery, dialysis, or in patients who are confined to bed. This medicine may be used for other purposes; ask your health care provider or pharmacist if you have questions. COMMON BRAND NAME(S): Hep-Lock, Hep-Lock U/P, Hepflush-10, Monoject Prefill Advanced Heparin Lock Flush, SASH Normal Saline and Heparin What should I tell my care team before I take this medication? They need to know if you have any of these conditions: bleeding disorders, such as hemophilia or low blood platelets bowel disease or diverticulitis endocarditis high blood pressure liver disease recent surgery or delivery of a baby stomach ulcers an unusual or allergic reaction to heparin, benzyl alcohol, sulfites, other medicines, foods, dyes, or preservatives pregnant or trying to get pregnant breast-feeding How should I use this medication? This medicine is given by injection or infusion into a vein. It can also be given by injection of small amounts under the skin. It is usually given by a health care professional in a hospital or clinic setting. If you get this medicine at home, you will be taught how to prepare and give this medicine. Use exactly as directed. Take your medicine at regular intervals. Do not take it more often than directed. Do not stop taking except on your doctor's advice. Stopping this medicine may increase your risk of a blot clot. Be sure to refill your prescription before you run out of medicine. It is important that you put your used needles and syringes in a special sharps container. Do not put them in a trash can. If you do not have a sharps container, call your pharmacist or healthcare provider to get one. Talk to your pediatrician regarding the  use of this medicine in children. While this medicine may be prescribed for children for selected conditions, precautions do apply. Overdosage: If you think you have taken too much of this medicine contact a poison control center or emergency room at once. NOTE: This medicine is only for you. Do not share this medicine with others. What if I miss a dose? If you miss a dose, take it as soon as you can. If it is almost time for your next dose, take only that dose. Do not take double or extra doses. What may interact with this medication? Do not take this medicine with any of the following medications: aspirin and aspirin-like drugs mifepristone medicines that treat or prevent blood clots like warfarin, enoxaparin, and dalteparin palifermin protamine This medicine may also interact with the following medications: dextran digoxin hydroxychloroquine medicines for treating colds or allergies nicotine NSAIDs, medicines for pain and inflammation, like ibuprofen or naproxen phenylbutazone tetracycline antibiotics This list may not describe all possible interactions. Give your health care provider a list of all the medicines, herbs, non-prescription drugs, or dietary supplements you use. Also tell them if you smoke, drink alcohol, or use illegal drugs. Some items may interact with your medicine. What should I watch for while using this medication? Visit your healthcare professional for regular checks on your progress. You may need blood work done while you are taking this medicine. Your condition will be monitored carefully while you are receiving this medicine. It is important not to miss any appointments. Wear a medical ID bracelet or chain, and carry a card that describes your disease and details   of your medicine and dosage times. Notify your doctor or healthcare professional at once if you have cold, blue hands or feet. If you are going to need surgery or other procedure, tell your healthcare  professional that you are using this medicine. Avoid sports and activities that might cause injury while you are using this medicine. Severe falls or injuries can cause unseen bleeding. Be careful when using sharp tools or knives. Consider using an electric razor. Take special care brushing or flossing your teeth. Report any injuries, bruising, or red spots on the skin to your healthcare professional. Using this medicine for a long time may weaken your bones and increase the risk of bone fractures. You should make sure that you get enough calcium and vitamin D while you are taking this medicine. Discuss the foods you eat and the vitamins you take with your healthcare professional. Wear a medical ID bracelet or chain. Carry a card that describes your disease and details of your medicine and dosage times. What side effects may I notice from receiving this medication? Side effects that you should report to your doctor or health care professional as soon as possible: allergic reactions like skin rash, itching or hives, swelling of the face, lips, or tongue bone pain fever, chills nausea, vomiting signs and symptoms of bleeding such as bloody or black, tarry stools; red or dark-brown urine; spitting up blood or brown material that looks like coffee grounds; red spots on the skin; unusual bruising or bleeding from the eye, gums, or nose signs and symptoms of a blood clot such as chest pain; shortness of breath; pain, swelling, or warmth in the leg signs and symptoms of a stroke such as changes in vision; confusion; trouble speaking or understanding; severe headaches; sudden numbness or weakness of the face, arm or leg; trouble walking; dizziness; loss of coordination Side effects that usually do not require medical attention (report to your doctor or health care professional if they continue or are bothersome): hair loss pain, redness, or irritation at site where injected This list may not describe all  possible side effects. Call your doctor for medical advice about side effects. You may report side effects to FDA at 1-800-FDA-1088. Where should I keep my medication? Keep out of the reach of children. Store unopened vials at room temperature between 15 and 30 degrees C (59 and 86 degrees F). Do not freeze. Do not use if solution is discolored or particulate matter is present. Throw away any unused medicine after the expiration date. NOTE: This sheet is a summary. It may not cover all possible information. If you have questions about this medicine, talk to your doctor, pharmacist, or health care provider.  2023 Elsevier/Gold Standard (2005-03-20 00:00:00)  

## 2022-04-14 NOTE — Progress Notes (Signed)
Patient was seen today for his pump stop appointment. This Probation officer noticed his port a cath dressing was half undone and his bio-patch was swollen like it had gotten soaked. When patient was asked about the dressing, he reported to this writer that he took a shower and that he always takes a shower with the portable pump infusion. This Probation officer asked if patient was aware he was not to take showers for the duration of his home infusion, he admitted to be aware and stated "I can not go days without a shower" This Probation officer then informed him that he can take a sponge bath instead which would not have his dressing soaked up in bath water. This Probation officer further explained that when his dressing gets soaked up and comes loose and the bio-patch is also soaked and swollen, this exposes his port access to infection and also systemic infection for him.   Patient appeared to take this lightly because he kept making jokes about the situation and stated that he has just one more treatment and "I will be alright". This Probation officer emphasized the importance of adhering to not taking a shower or compromising his port access in anyway in order to keep him safe. Patient later promised not to take a shower while accessed and on his home infusion again.

## 2022-04-23 ENCOUNTER — Other Ambulatory Visit: Payer: Self-pay | Admitting: Oncology

## 2022-04-26 ENCOUNTER — Inpatient Hospital Stay (HOSPITAL_BASED_OUTPATIENT_CLINIC_OR_DEPARTMENT_OTHER): Payer: Commercial Managed Care - PPO | Admitting: Nurse Practitioner

## 2022-04-26 ENCOUNTER — Other Ambulatory Visit (HOSPITAL_COMMUNITY): Payer: Self-pay

## 2022-04-26 ENCOUNTER — Inpatient Hospital Stay: Payer: Commercial Managed Care - PPO | Attending: Oncology

## 2022-04-26 ENCOUNTER — Encounter: Payer: Self-pay | Admitting: Oncology

## 2022-04-26 ENCOUNTER — Inpatient Hospital Stay: Payer: Commercial Managed Care - PPO

## 2022-04-26 ENCOUNTER — Encounter: Payer: Self-pay | Admitting: Nurse Practitioner

## 2022-04-26 ENCOUNTER — Telehealth: Payer: Self-pay | Admitting: Radiation Oncology

## 2022-04-26 ENCOUNTER — Telehealth: Payer: Self-pay | Admitting: Pharmacist

## 2022-04-26 ENCOUNTER — Telehealth: Payer: Self-pay

## 2022-04-26 VITALS — BP 130/85 | HR 97 | Temp 98.0°F

## 2022-04-26 VITALS — BP 135/89 | HR 100 | Temp 98.2°F | Resp 20 | Ht 72.0 in | Wt 255.0 lb

## 2022-04-26 DIAGNOSIS — Z5111 Encounter for antineoplastic chemotherapy: Secondary | ICD-10-CM | POA: Insufficient documentation

## 2022-04-26 DIAGNOSIS — C19 Malignant neoplasm of rectosigmoid junction: Secondary | ICD-10-CM | POA: Diagnosis not present

## 2022-04-26 DIAGNOSIS — C20 Malignant neoplasm of rectum: Secondary | ICD-10-CM

## 2022-04-26 LAB — CMP (CANCER CENTER ONLY)
ALT: 19 U/L (ref 0–44)
AST: 17 U/L (ref 15–41)
Albumin: 3.9 g/dL (ref 3.5–5.0)
Alkaline Phosphatase: 75 U/L (ref 38–126)
Anion gap: 9 (ref 5–15)
BUN: 12 mg/dL (ref 8–23)
CO2: 25 mmol/L (ref 22–32)
Calcium: 8.9 mg/dL (ref 8.9–10.3)
Chloride: 104 mmol/L (ref 98–111)
Creatinine: 0.81 mg/dL (ref 0.61–1.24)
GFR, Estimated: >60 mL/min (ref >60)
Glucose, Bld: 173 mg/dL — ABNORMAL HIGH (ref 70–99)
Potassium: 4.3 mmol/L (ref 3.5–5.1)
Sodium: 138 mmol/L (ref 135–145)
Total Bilirubin: 0.4 mg/dL (ref 0.3–1.2)
Total Protein: 6.4 g/dL — ABNORMAL LOW (ref 6.5–8.1)

## 2022-04-26 LAB — CBC WITH DIFFERENTIAL (CANCER CENTER ONLY)
Abs Immature Granulocytes: 0.01 10*3/uL (ref 0.00–0.07)
Basophils Absolute: 0.1 10*3/uL (ref 0.0–0.1)
Basophils Relative: 2 %
Eosinophils Absolute: 0.4 10*3/uL (ref 0.0–0.5)
Eosinophils Relative: 10 %
HCT: 38.9 % — ABNORMAL LOW (ref 39.0–52.0)
Hemoglobin: 12.7 g/dL — ABNORMAL LOW (ref 13.0–17.0)
Immature Granulocytes: 0 %
Lymphocytes Relative: 22 %
Lymphs Abs: 0.9 10*3/uL (ref 0.7–4.0)
MCH: 27.7 pg (ref 26.0–34.0)
MCHC: 32.6 g/dL (ref 30.0–36.0)
MCV: 84.7 fL (ref 80.0–100.0)
Monocytes Absolute: 0.4 10*3/uL (ref 0.1–1.0)
Monocytes Relative: 10 %
Neutro Abs: 2.3 10*3/uL (ref 1.7–7.7)
Neutrophils Relative %: 56 %
Platelet Count: 185 10*3/uL (ref 150–400)
RBC: 4.59 MIL/uL (ref 4.22–5.81)
RDW: 14.7 % (ref 11.5–15.5)
WBC Count: 4.2 10*3/uL (ref 4.0–10.5)
nRBC: 0 % (ref 0.0–0.2)

## 2022-04-26 LAB — MAGNESIUM: Magnesium: 1.9 mg/dL (ref 1.7–2.4)

## 2022-04-26 MED ORDER — LORAZEPAM 0.5 MG PO TABS: 0.5000 mg | ORAL_TABLET | Freq: Three times a day (TID) | ORAL | 0 refills | Status: DC | PRN

## 2022-04-26 MED ORDER — PALONOSETRON HCL INJECTION 0.25 MG/5ML
0.2500 mg | Freq: Once | INTRAVENOUS | Status: AC
  Administered 2022-04-26: 0.25 mg via INTRAVENOUS
  Filled 2022-04-26: qty 5

## 2022-04-26 MED ORDER — CAPECITABINE 500 MG PO TABS
ORAL_TABLET | ORAL | 0 refills | Status: DC
  Filled 2022-04-26: qty 224, fill #0

## 2022-04-26 MED ORDER — SODIUM CHLORIDE 0.9% FLUSH
10.0000 mL | INTRAVENOUS | Status: DC | PRN
  Administered 2022-04-26: 10 mL

## 2022-04-26 MED ORDER — SODIUM CHLORIDE 0.9 % IV SOLN
1440.0000 mg/m2 | INTRAVENOUS | Status: DC
  Administered 2022-04-26: 3450 mg via INTRAVENOUS
  Filled 2022-04-26: qty 69

## 2022-04-26 MED ORDER — LEUCOVORIN CALCIUM INJECTION 350 MG
300.0000 mg/m2 | Freq: Once | INTRAVENOUS | Status: AC
  Administered 2022-04-26: 724 mg via INTRAVENOUS
  Filled 2022-04-26: qty 36.2

## 2022-04-26 MED ORDER — CAPECITABINE 500 MG PO TABS: 2000.0000 mg | ORAL_TABLET | Freq: Two times a day (BID) | ORAL | 0 refills | Status: DC

## 2022-04-26 MED ORDER — SODIUM CHLORIDE 0.9 % IV SOLN
150.0000 mg | Freq: Once | INTRAVENOUS | Status: AC
  Administered 2022-04-26: 150 mg via INTRAVENOUS
  Filled 2022-04-26: qty 5

## 2022-04-26 MED ORDER — DEXTROSE 5 % IV SOLN: Freq: Once | INTRAVENOUS | Status: AC

## 2022-04-26 MED ORDER — DIPHENOXYLATE-ATROPINE 2.5-0.025 MG PO TABS: 1.0000 | ORAL_TABLET | Freq: Four times a day (QID) | ORAL | 0 refills | Status: DC | PRN

## 2022-04-26 MED ORDER — OXALIPLATIN CHEMO INJECTION 100 MG/20ML
63.0000 mg/m2 | Freq: Once | INTRAVENOUS | Status: AC
  Administered 2022-04-26: 150 mg via INTRAVENOUS
  Filled 2022-04-26: qty 10

## 2022-04-26 MED ORDER — SODIUM CHLORIDE 0.9 % IV SOLN
10.0000 mg | Freq: Once | INTRAVENOUS | Status: AC
  Administered 2022-04-26: 10 mg via INTRAVENOUS
  Filled 2022-04-26: qty 10
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
## 2022-04-26 NOTE — Telephone Encounter (Addendum)
Oral Oncology Pharmacist Encounter  Received new prescription for Xeloda (capecitabine) for the neoadjuvant treatment of rectal cancer in conjunction with radiation, planned for duration of radiation therapy (likely ~5 1/2 weeks). Patient is completing cycle 8 of FOLFOX on 04/26/22.  CBC w/ Diff and CMP from 04/26/22 assessed, no relevant lab abnormalities requiring baseline dose adjustment required at this time. Prescription dose and frequency assessed for appropriateness. Per MD note on 04/26/22, patient does not want to start radiation until 05/29/22.  Current medication list in Epic reviewed, DDIs with Xeloda identified: Category C DDI between Xeloda and Omeprazole - proton-pump inhibitors can decrease efficacy of Xeloda - will discuss with patient alternatives to omeprazole such as H2RA's like famotidine while on Xeloda.  Evaluated chart and no patient barriers to medication adherence noted.   Patient agreement for treatment documented in MD note on 04/26/22.  Patient's insurance requires that Xeloda be filled through [employer] Prescription redirected to [facility] for dispensing.   Oral Oncology Clinic will continue to follow for insurance authorization, copayment issues, initial counseling and start date.  Leron Croak, PharmD, BCPS, [facility] Hematology/Oncology Clinical Pharmacist [place] and [place] [phone] 04/26/2022 5:08 PM

## 2022-04-26 NOTE — Patient Instructions (Signed)

## 2022-04-26 NOTE — Telephone Encounter (Signed)
Oral Oncology Patient Advocate Encounter  After completing a benefits investigation, prior authorization for Capecitabine is not required at this time through [employer].  Patient must fill through [place], [phone]   OR FAX [phone].     Berdine Addison, [place] [facility] Patient [place]  [phone] (phone) [phone] (fax) 04/26/2022 3:57 PM

## 2022-04-26 NOTE — Progress Notes (Signed)
Patient seen by Ned Card NP today  Vitals are within treatment parameters.  Labs reviewed by Ned Card NP and are within treatment parameters.  Per physician team, patient is ready for treatment. Please note that modifications are being made to the treatment plan including The 5-FU bolus will be eliminated and pump dose reduced

## 2022-04-26 NOTE — Telephone Encounter (Signed)
11/1 @ 11:29 am Left voicemail for patient to call our office to be schedule for consult.  Patient currently in infusion clinic.

## 2022-04-26 NOTE — Patient Instructions (Addendum)
[facility]   Discharge Instructions: Thank you for choosing [facility] to provide your oncology and hematology care.   If you have a lab appointment with the [facility], please go directly to the [facility] and check in at the registration area.   Wear comfortable clothing and clothing appropriate for easy access to any Portacath or PICC line.   We strive to give you quality time with your provider. You may need to reschedule your appointment if you arrive late (15 or more minutes).  Arriving late affects you and other patients whose appointments are after yours.  Also, if you miss three or more appointments without notifying the office, you may be dismissed from the clinic at the provider's discretion.      For prescription refill requests, have your pharmacy contact our office and allow 72 hours for refills to be completed.    Today you received the following chemotherapy and/or immunotherapy agents Oxaliplatin (ELOXATIN), Leucovorin & Flourouracil (ADRUCIL).      To help prevent nausea and vomiting after your treatment, we encourage you to take your nausea medication as directed.  BELOW ARE SYMPTOMS THAT SHOULD BE REPORTED IMMEDIATELY: *FEVER GREATER THAN 100.4 F (38 C) OR HIGHER *CHILLS OR SWEATING *NAUSEA AND VOMITING THAT IS NOT CONTROLLED WITH YOUR NAUSEA MEDICATION *UNUSUAL SHORTNESS OF BREATH *UNUSUAL BRUISING OR BLEEDING *URINARY PROBLEMS (pain or burning when urinating, or frequent urination) *BOWEL PROBLEMS (unusual diarrhea, constipation, pain near the anus) TENDERNESS IN MOUTH AND THROAT WITH OR WITHOUT PRESENCE OF ULCERS (sore throat, sores in mouth, or a toothache) UNUSUAL RASH, SWELLING OR PAIN  UNUSUAL VAGINAL DISCHARGE OR ITCHING   Items with * indicate a potential emergency and should be followed up as soon as possible or go to the Emergency Department if any problems should occur.  Please show the CHEMOTHERAPY ALERT  CARD or IMMUNOTHERAPY ALERT CARD at check-in to the Emergency Department and triage nurse.  Should you have questions after your visit or need to cancel or reschedule your appointment, please contact [facility]  Dept: [phone]  and follow the prompts.  Office hours are 8:00 a.m. to 4:30 p.m. Monday - Friday. Please note that voicemails left after 4:00 p.m. may not be returned until the following business day.  We are closed weekends and major holidays. You have access to a nurse at all times for urgent questions. Please call the main number to the clinic Dept: [phone] and follow the prompts.   For any non-urgent questions, you may also contact your provider using MyChart. We now offer e-Visits for anyone 68 and older to request care online for non-urgent symptoms. For details visit [email or web address].   Also download the MyChart app! Go to the app store, search "MyChart", open the app, select , and log in with your MyChart username and password.  Masks are optional in the cancer centers. If you would like for your care team to wear a mask while they are taking care of you, please let them know. You may have one support person who is at least 62 years old accompany you for your appointments.  Oxaliplatin Injection What is this medication? OXALIPLATIN (ox AL i PLA tin) treats some types of cancer. It works by slowing down the growth of cancer cells. This medicine may be used for other purposes; ask your health care provider or pharmacist if you have questions. COMMON BRAND NAME(S): Eloxatin What should I tell my  care team before I take this medication? They need to know if you have any of these conditions: Heart disease History of irregular heartbeat or rhythm Liver disease Low blood cell levels (white cells, red cells, and platelets) Lung or breathing disease, such as asthma Take medications that treat or prevent blood clots Tingling of the  fingers, toes, or other nerve disorder An unusual or allergic reaction to oxaliplatin, other medications, foods, dyes, or preservatives If you or your partner are pregnant or trying to get pregnant Breast-feeding How should I use this medication? This medication is injected into a vein. It is given by your care team in a hospital or clinic setting. Talk to your care team about the use of this medication in children. Special care may be needed. Overdosage: If you think you have taken too much of this medicine contact a poison control center or emergency room at once. NOTE: This medicine is only for you. Do not share this medicine with others. What if I miss a dose? Keep appointments for follow-up doses. It is important not to miss a dose. Call your care team if you are unable to keep an appointment. What may interact with this medication? Do not take this medication with any of the following: Cisapride Dronedarone Pimozide Thioridazine This medication may also interact with the following: Aspirin and aspirin-like medications Certain medications that treat or prevent blood clots, such as warfarin, apixaban, dabigatran, and rivaroxaban Cisplatin Cyclosporine Diuretics Medications for infection, such as acyclovir, adefovir, amphotericin B, bacitracin, cidofovir, foscarnet, ganciclovir, gentamicin, pentamidine, vancomycin NSAIDs, medications for pain and inflammation, such as ibuprofen or naproxen Other medications that cause heart rhythm changes Pamidronate Zoledronic acid This list may not describe all possible interactions. Give your health care provider a list of all the medicines, herbs, non-prescription drugs, or dietary supplements you use. Also tell them if you smoke, drink alcohol, or use illegal drugs. Some items may interact with your medicine. What should I watch for while using this medication? Your condition will be monitored carefully while you are receiving this  medication. You may need blood work while taking this medication. This medication may make you feel generally unwell. This is not uncommon as chemotherapy can affect healthy cells as well as cancer cells. Report any side effects. Continue your course of treatment even though you feel ill unless your care team tells you to stop. This medication may increase your risk of getting an infection. Call your care team for advice if you get a fever, chills, sore throat, or other symptoms of a cold or flu. Do not treat yourself. Try to avoid being around people who are sick. Avoid taking medications that contain aspirin, acetaminophen, ibuprofen, naproxen, or ketoprofen unless instructed by your care team. These medications may hide a fever. Be careful brushing or flossing your teeth or using a toothpick because you may get an infection or bleed more easily. If you have any dental work done, tell your dentist you are receiving this medication. This medication can make you more sensitive to cold. Do not drink cold drinks or use ice. Cover exposed skin before coming in contact with cold temperatures or cold objects. When out in cold weather wear warm clothing and cover your mouth and nose to warm the air that goes into your lungs. Tell your care team if you get sensitive to the cold. Talk to your care team if you or your partner are pregnant or think either of you might be pregnant. This medication can  cause serious birth defects if taken during pregnancy and for 9 months after the last dose. A negative pregnancy test is required before starting this medication. A reliable form of contraception is recommended while taking this medication and for 9 months after the last dose. Talk to your care team about effective forms of contraception. Do not father a child while taking this medication and for 6 months after the last dose. Use a condom while having sex during this time period. Do not breastfeed while taking this  medication and for 3 months after the last dose. This medication may cause infertility. Talk to your care team if you are concerned about your fertility. What side effects may I notice from receiving this medication? Side effects that you should report to your care team as soon as possible: Allergic reactions--skin rash, itching, hives, swelling of the face, lips, tongue, or throat Bleeding--bloody or black, tar-like stools, vomiting blood or brown material that looks like coffee grounds, red or dark brown urine, small red or purple spots on skin, unusual bruising or bleeding Dry cough, shortness of breath or trouble breathing Heart rhythm changes--fast or irregular heartbeat, dizziness, feeling faint or lightheaded, chest pain, trouble breathing Infection--fever, chills, cough, sore throat, wounds that don't heal, pain or trouble when passing urine, general feeling of discomfort or being unwell Liver injury--right upper belly pain, loss of appetite, nausea, light-colored stool, dark yellow or brown urine, yellowing skin or eyes, unusual weakness or fatigue Low red blood cell level--unusual weakness or fatigue, dizziness, headache, trouble breathing Muscle injury--unusual weakness or fatigue, muscle pain, dark yellow or brown urine, decrease in amount of urine Pain, tingling, or numbness in the hands or feet Sudden and severe headache, confusion, change in vision, seizures, which may be signs of posterior reversible encephalopathy syndrome (PRES) Unusual bruising or bleeding Side effects that usually do not require medical attention (report to your care team if they continue or are bothersome): Diarrhea Nausea Pain, redness, or swelling with sores inside the mouth or throat Unusual weakness or fatigue Vomiting This list may not describe all possible side effects. Call your doctor for medical advice about side effects. You may report side effects to FDA at 1-800-FDA-1088. Where should I keep my  medication? This medication is given in a hospital or clinic. It will not be stored at home. NOTE: This sheet is a summary. It may not cover all possible information. If you have questions about this medicine, talk to your doctor, pharmacist, or health care provider.  2023 Elsevier/Gold Standard (2021-10-07 00:00:00)  Leucovorin Injection What is this medication? LEUCOVORIN (loo koe VOR in) prevents side effects from certain medications, such as methotrexate. It works by increasing folate levels. This helps protect healthy cells in your body. It may also be used to treat anemia caused by low levels of folate. It can also be used with fluorouracil, a type of chemotherapy, to treat colorectal cancer. It works by increasing the effects of fluorouracil in the body. This medicine may be used for other purposes; ask your health care provider or pharmacist if you have questions. What should I tell my care team before I take this medication? They need to know if you have any of these conditions: Anemia from low levels of vitamin B12 in the blood An unusual or allergic reaction to leucovorin, folic acid, other medications, foods, dyes, or preservatives Pregnant or trying to get pregnant Breastfeeding How should I use this medication? This medication is injected into a vein or a  muscle. It is given by your care team in a hospital or clinic setting. Talk to your care team about the use of this medication in children. Special care may be needed. Overdosage: If you think you have taken too much of this medicine contact a poison control center or emergency room at once. NOTE: This medicine is only for you. Do not share this medicine with others. What if I miss a dose? Keep appointments for follow-up doses. It is important not to miss your dose. Call your care team if you are unable to keep an appointment. What may interact with this  medication? Capecitabine Fluorouracil Phenobarbital Phenytoin Primidone Trimethoprim;sulfamethoxazole This list may not describe all possible interactions. Give your health care provider a list of all the medicines, herbs, non-prescription drugs, or dietary supplements you use. Also tell them if you smoke, drink alcohol, or use illegal drugs. Some items may interact with your medicine. What should I watch for while using this medication? Your condition will be monitored carefully while you are receiving this medication. This medication may increase the side effects of 5-fluorouracil. Tell your care team if you have diarrhea or mouth sores that do not get better or that get worse. What side effects may I notice from receiving this medication? Side effects that you should report to your care team as soon as possible: Allergic reactions--skin rash, itching, hives, swelling of the face, lips, tongue, or throat This list may not describe all possible side effects. Call your doctor for medical advice about side effects. You may report side effects to FDA at 1-800-FDA-1088. Where should I keep my medication? This medication is given in a hospital or clinic. It will not be stored at home. NOTE: This sheet is a summary. It may not cover all possible information. If you have questions about this medicine, talk to your doctor, pharmacist, or health care provider.  2023 Elsevier/Gold Standard (2021-10-21 00:00:00)  Fluorouracil Injection What is this medication? FLUOROURACIL (flure oh YOOR a sil) treats some types of cancer. It works by slowing down the growth of cancer cells. This medicine may be used for other purposes; ask your health care provider or pharmacist if you have questions. COMMON BRAND NAME(S): Adrucil What should I tell my care team before I take this medication? They need to know if you have any of these conditions: Blood disorders Dihydropyrimidine dehydrogenase (DPD)  deficiency Infection, such as chickenpox, cold sores, herpes Kidney disease Liver disease Poor nutrition Recent or ongoing radiation therapy An unusual or allergic reaction to fluorouracil, other medications, foods, dyes, or preservatives If you or your partner are pregnant or trying to get pregnant Breast-feeding How should I use this medication? This medication is injected into a vein. It is administered by your care team in a hospital or clinic setting. Talk to your care team about the use of this medication in children. Special care may be needed. Overdosage: If you think you have taken too much of this medicine contact a poison control center or emergency room at once. NOTE: This medicine is only for you. Do not share this medicine with others. What if I miss a dose? Keep appointments for follow-up doses. It is important not to miss your dose. Call your care team if you are unable to keep an appointment. What may interact with this medication? Do not take this medication with any of the following: Live virus vaccines This medication may also interact with the following: Medications that treat or prevent blood clots, such  as warfarin, enoxaparin, dalteparin This list may not describe all possible interactions. Give your health care provider a list of all the medicines, herbs, non-prescription drugs, or dietary supplements you use. Also tell them if you smoke, drink alcohol, or use illegal drugs. Some items may interact with your medicine. What should I watch for while using this medication? Your condition will be monitored carefully while you are receiving this medication. This medication may make you feel generally unwell. This is not uncommon as chemotherapy can affect healthy cells as well as cancer cells. Report any side effects. Continue your course of treatment even though you feel ill unless your care team tells you to stop. In some cases, you may be given additional medications  to help with side effects. Follow all directions for their use. This medication may increase your risk of getting an infection. Call your care team for advice if you get a fever, chills, sore throat, or other symptoms of a cold or flu. Do not treat yourself. Try to avoid being around people who are sick. This medication may increase your risk to bruise or bleed. Call your care team if you notice any unusual bleeding. Be careful brushing or flossing your teeth or using a toothpick because you may get an infection or bleed more easily. If you have any dental work done, tell your dentist you are receiving this medication. Avoid taking medications that contain aspirin, acetaminophen, ibuprofen, naproxen, or ketoprofen unless instructed by your care team. These medications may hide a fever. Do not treat diarrhea with over the counter products. Contact your care team if you have diarrhea that lasts more than 2 days or if it is severe and watery. This medication can make you more sensitive to the sun. Keep out of the sun. If you cannot avoid being in the sun, wear protective clothing and sunscreen. Do not use sun lamps, tanning beds, or tanning booths. Talk to your care team if you or your partner wish to become pregnant or think you might be pregnant. This medication can cause serious birth defects if taken during pregnancy and for 3 months after the last dose. A reliable form of contraception is recommended while taking this medication and for 3 months after the last dose. Talk to your care team about effective forms of contraception. Do not father a child while taking this medication and for 3 months after the last dose. Use a condom while having sex during this time period. Do not breastfeed while taking this medication. This medication may cause infertility. Talk to your care team if you are concerned about your fertility. What side effects may I notice from receiving this medication? Side effects that you  should report to your care team as soon as possible: Allergic reactions--skin rash, itching, hives, swelling of the face, lips, tongue, or throat Heart attack--pain or tightness in the chest, shoulders, arms, or jaw, nausea, shortness of breath, cold or clammy skin, feeling faint or lightheaded Heart failure--shortness of breath, swelling of the ankles, feet, or hands, sudden weight gain, unusual weakness or fatigue Heart rhythm changes--fast or irregular heartbeat, dizziness, feeling faint or lightheaded, chest pain, trouble breathing High ammonia level--unusual weakness or fatigue, confusion, loss of appetite, nausea, vomiting, seizures Infection--fever, chills, cough, sore throat, wounds that don't heal, pain or trouble when passing urine, general feeling of discomfort or being unwell Low red blood cell level--unusual weakness or fatigue, dizziness, headache, trouble breathing Pain, tingling, or numbness in the hands or feet, muscle weakness,  change in vision, confusion or trouble speaking, loss of balance or coordination, trouble walking, seizures Redness, swelling, and blistering of the skin over hands and feet Severe or prolonged diarrhea Unusual bruising or bleeding Side effects that usually do not require medical attention (report to your care team if they continue or are bothersome): Dry skin Headache Increased tears Nausea Pain, redness, or swelling with sores inside the mouth or throat Sensitivity to light Vomiting This list may not describe all possible side effects. Call your doctor for medical advice about side effects. You may report side effects to FDA at 1-800-FDA-1088. Where should I keep my medication? This medication is given in a hospital or clinic. It will not be stored at home. NOTE: This sheet is a summary. It may not cover all possible information. If you have questions about this medicine, talk to your doctor, pharmacist, or health care provider.  2023 Elsevier/Gold  Standard (2021-10-18 00:00:00)  The chemotherapy medication bag should finish at 46 hours, 96 hours, or 7 days. For example, if your pump is scheduled for 46 hours and it was put on at 4:00 p.m., it should finish at 2:00 p.m. the day it is scheduled to come off regardless of your appointment time.     Estimated time to finish at 12:30pm on Friday 04/28/22.   If the display on your pump reads "Low Volume" and it is beeping, take the batteries out of the pump and come to the cancer center for it to be taken off.   If the pump alarms go off prior to the pump reading "Low Volume" then call [phone] and someone can assist you.  If the plunger comes out and the chemotherapy medication is leaking out, please use your home chemo spill kit to clean up the spill. Do NOT use paper towels or other household products.  If you have problems or questions regarding your pump, please call either [phone] (24 hours a day) or the cancer center Monday-Friday 8:00 a.m.- 4:30 p.m. at the clinic number and we will assist you. If you are unable to get assistance, then go to the nearest Emergency Department and ask the staff to contact the IV team for assistance.

## 2022-04-27 ENCOUNTER — Other Ambulatory Visit (HOSPITAL_COMMUNITY): Payer: Self-pay

## 2022-04-27 ENCOUNTER — Other Ambulatory Visit: Payer: Self-pay

## 2022-04-27 ENCOUNTER — Telehealth: Payer: Self-pay

## 2022-04-27 NOTE — Telephone Encounter (Signed)
Oral Oncology Patient Advocate Encounter   Received notification that prior authorization for Capecitabine is required.   PA submitted on 11.02.23 to [facility] [id number] Status is pending     [name] [name], [name] [name] Patient [place]  [phone] (phone) [phone] (fax) 04/27/2022 2:04 PM

## 2022-04-27 NOTE — Telephone Encounter (Signed)
Oral Oncology Patient Advocate Encounter  Prior Authorization for Capecitabine has been approved.    PA# [id number]  Effective dates: 11.02.23 through 11.01.24  Patient must fill through [facility].    Berdine Addison, [facility] Patient [place]  [phone] (phone) [phone] (fax) 04/27/2022 3:00 PM

## 2022-04-28 ENCOUNTER — Inpatient Hospital Stay: Payer: Commercial Managed Care - PPO

## 2022-04-28 VITALS — BP 124/91 | HR 89 | Temp 98.2°F | Resp 20

## 2022-04-28 DIAGNOSIS — C20 Malignant neoplasm of rectum: Secondary | ICD-10-CM

## 2022-04-28 DIAGNOSIS — Z5111 Encounter for antineoplastic chemotherapy: Secondary | ICD-10-CM | POA: Diagnosis not present

## 2022-04-28 MED ORDER — SODIUM CHLORIDE 0.9% FLUSH
10.0000 mL | INTRAVENOUS | Status: DC | PRN
  Administered 2022-04-28: 10 mL

## 2022-04-28 MED ORDER — HEPARIN SOD (PORK) LOCK FLUSH 100 UNIT/ML IV SOLN
500.0000 [IU] | Freq: Once | INTRAVENOUS | Status: AC | PRN
  Administered 2022-04-28: 500 [IU]

## 2022-04-28 NOTE — Telephone Encounter (Signed)
Oral Chemotherapy Pharmacist Encounter   Spoke with patient today to follow up regarding patient's oral chemotherapy medication: Xeloda (capecitabine)  Informed patient that his medication is being filled through [facility], and that he will be receiving a call from [facility] to set up shipment of the medication to his home. Patient informed of $0 copay for medication.  Mr. Desroches verbalized understanding that he will not be starting the Xeloda until he starts radiation, and that if he receives the Xeloda in the mail in the meantime, he should place it in a safe space until he is scheduled to start therapy.   Oral Oncology Clinic will continue to follow for initial counseling and start date.  Leron Croak, PharmD, BCPS, [facility] Hematology/Oncology Clinical Pharmacist [facility] [place] [phone] 04/28/2022 10:21 AM

## 2022-04-28 NOTE — Patient Instructions (Signed)

## 2022-05-01 NOTE — Telephone Encounter (Signed)
Per [facility], medication is scheduled to be delivered on 05/03/22 with a $0 copay.

## 2022-05-02 ENCOUNTER — Other Ambulatory Visit: Payer: Self-pay

## 2022-05-03 ENCOUNTER — Other Ambulatory Visit: Payer: Self-pay

## 2022-05-03 DIAGNOSIS — C20 Malignant neoplasm of rectum: Secondary | ICD-10-CM

## 2022-05-03 MED ORDER — ONDANSETRON HCL 8 MG PO TABS: 8.0000 mg | ORAL_TABLET | Freq: Three times a day (TID) | ORAL | 3 refills | Status: DC | PRN

## 2022-05-04 ENCOUNTER — Other Ambulatory Visit: Payer: Self-pay | Admitting: Nurse Practitioner

## 2022-05-04 DIAGNOSIS — C20 Malignant neoplasm of rectum: Secondary | ICD-10-CM

## 2022-05-04 MED ORDER — LORAZEPAM 0.5 MG PO TABS: 0.5000 mg | ORAL_TABLET | Freq: Three times a day (TID) | ORAL | 0 refills | Status: DC | PRN

## 2022-05-08 NOTE — Progress Notes (Signed)
Radiation Oncology         [phone] ________________________________  Name: [name] [name]        MRN: [id number]  Date of Service: 05/11/2022 DOB: 08/10/1960  JK:DTOIZ, Theador Hawthorne, FNP  Ladell Pier, MD     REFERRING PHYSICIAN: Ladell Pier, MD   DIAGNOSIS: The encounter diagnosis was Rectal cancer [facility]).   HISTORY OF PRESENT ILLNESS: [name] [name] is a 61 y.o. male seen at the request of Dr. Benay Spice for a diagnosis if [id number] adenocarcinoma of the rectum.  The patient noticed some discomfort in the pelvic region associated with some bleeding earlier in the spring of 2023.  Work-up revealed a tumor in the region of the rectosigmoid junction.  CT imaging did show some thickening in this region as well as a potential hypodense lesion in segment 7 of the liver.  MRI scan of the pelvis revealed a T4aN2 tumor measuring 3.9 cm.  It appeared to invade into the upper rectum from originating at the site near the rectosigmoid junction.  The patient did proceed with an MRI scan of the abdomen which remains suspicious for a solitary metastatic lesion.  Biopsy did confirm adenocarcinoma within the rectum.  Case has been discussed in multidisciplinary GI oncology conference.  It was recommended that he undergo total neoadjuvant chemotherapy which he received with FOLFOX between 11/30/21-04/26/22 .  He did also undergo open partial hepatectomy on 03/10/2022 with Dr. Zenia Resides and this revealed metastatic adenocarcinoma.  The disease measured 1.8 cm in greatest dimension, and was 1 mm from the blue inked external margin and 5 mm from the black inked deep resection margin.  He is now ready to proceed with locally aggressive treatment to the pelvis and is seen to discuss chemoradiation. He will follow up with Dr. Dema Severin in colorectal surgery as well at the conclusion of radiation.      PREVIOUS RADIATION THERAPY: No   PAST MEDICAL HISTORY:  Past Medical History:  Diagnosis Date   Allergy     SEASONAL   Arthritis    Asthma    only due to hey fever   Blood in stool    Cancer ([place]) 10/2021   rectal cancer   Diabetes mellitus without complication ([place]) 83/38/2505   type 2   Family history of breast cancer    Family history of colon cancer    GERD (gastroesophageal reflux disease)    Hay fever    History of back pain    Hx of colonic polyps    Migraines    PONV (postoperative nausea and vomiting)    also history of motion sickness       PAST SURGICAL HISTORY: Past Surgical History:  Procedure Laterality Date   ANTERIOR CERVICAL DECOMP/DISCECTOMY FUSION  02/20/2012   Procedure: ANTERIOR CERVICAL DECOMPRESSION/DISCECTOMY FUSION 1 LEVEL;  Surgeon: Erline Levine, MD;  Location: [place] [facility];  Service: Neurosurgery;  Laterality: N/A;  Cervical six - seven  Anterior cervical decompression/diskectomy/fusion   APPENDECTOMY  06/26/1982   COLONOSCOPY     IR IMAGING GUIDED PORT INSERTION  11/28/2021   right side   KNEE ARTHROSCOPY     LAPAROSCOPY N/A 03/10/2022   Procedure: STAGING LAPAROSCOPY;  Surgeon: Dwan Bolt, MD;  Location: [place];  Service: General;  Laterality: N/A;   NASAL SEPTUM SURGERY  06/26/2006   OPEN PARTIAL HEPATECTOMY  N/A 03/10/2022   Procedure: OPEN PARTIAL HEPATECTOMY;  Surgeon: Dwan Bolt, MD;  Location: [place];  Service: General;  Laterality: N/A;   SPINAL CORD STIMULATOR INSERTION N/A 03/10/2021   Procedure: SPINAL CORD STIMULATOR PLACEMENT;  Surgeon: Melina Schools, MD;  Location: [place];  Service: Orthopedics;  Laterality: N/A;  2.5 HRS 3C-BED   WISDOM TOOTH EXTRACTION       FAMILY HISTORY:  Family History  Problem Relation Age of Onset   Diabetes Mother    Breast cancer Mother 69   Diabetes Father        type II   Colon polyps Sister        11+ polyps   Other Brother        reportedly negative genetic testing   Diabetes Brother    Colon cancer Maternal Aunt    Colon cancer Maternal Uncle        x2   Colon cancer Maternal Uncle         x3   Colon cancer Maternal Uncle    Colon cancer Maternal Uncle    Colon cancer Maternal Uncle    Colon cancer Maternal Uncle    Other Maternal Uncle        farm accident as a teen   Colon cancer Maternal Grandmother    Cancer Maternal Grandmother        breast, colon    Diabetes Paternal Grandfather    Colon cancer Cousin        <50   Colon cancer Cousin        maternal cousin's children   Esophageal cancer Neg Hx    Rectal cancer Neg Hx    Stomach cancer Neg Hx      SOCIAL HISTORY:  reports that he quit smoking about 36 years ago. His smoking use included cigarettes. He smoked an average of 1 pack per day. He has never been exposed to tobacco smoke. He has never used smokeless tobacco. He reports that he does not currently use alcohol. He reports that he does not use drugs.  The patient is married and lives in [place]. 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   ALLERGIES: Other   MEDICATIONS:  Current Outpatient Medications  Medication Sig Dispense Refill   acetaminophen (TYLENOL) 500 MG tablet Take 1,000 mg by mouth every 6 (six) hours as needed for fever.     albuterol (VENTOLIN HFA) 108 (90 Base) MCG/ACT inhaler Inhale 2 puffs into the lungs every 6 (six) hours as needed for wheezing or shortness of breath. (Patient not taking: Reported on 03/29/2022) 18 g 2   capecitabine (XELODA) 500 MG tablet Take 4 tablets (2,000 mg total) by mouth 2 (two) times daily. (Total dose 4000 mg daily) Take within 30 minutes after meals. Take only on days of radiation 224 tablet 0   Continuous Blood Gluc Sensor (FREESTYLE LIBRE 3 SENSOR) MISC Place 1 sensor on the skin every 14 days. Use to check glucose continuously.  DX: E11.65 2 each 11   diphenoxylate-atropine (LOMOTIL) 2.5-0.025 MG tablet Take 1-2 tablets by mouth 4 (four) times daily as needed for diarrhea or loose stools. 30 tablet 0   Dulaglutide (TRULICITY) 1.5 XK/4.8JE SOPN Inject 1.5 mg into the skin once a week. (Patient not taking:  Reported on 04/26/2022) 6 mL 2   empagliflozin (JARDIANCE) 25 MG TABS tablet Take 1 tablet (25 mg total) by mouth daily before breakfast. 90 tablet 0   fluticasone (FLONASE) 50 MCG/ACT nasal spray Place 2 sprays into both nostrils daily. (Patient not taking: Reported on 03/29/2022) 48 g 1   insulin glargine, 1 Unit  Dial, (TOUJEO SOLOSTAR) 300 UNIT/ML Solostar Pen Inject 30 Units into the skin at bedtime. 3 mL 2   insulin lispro (HUMALOG KWIKPEN) 100 UNIT/ML KwikPen Inject 8 Units into the skin 3 (three) times daily. (Patient not taking: Reported on 04/26/2022) 6 mL 1   Insulin Pen Needle 29G X 5MM MISC 1 Application by Does not apply route at bedtime. 100 each 2   loperamide (IMODIUM A-D) 2 MG tablet Take 2-4 mg by mouth 4 (four) times daily as needed for diarrhea or loose stools.     LORazepam (ATIVAN) 0.5 MG tablet Take 1 tablet (0.5 mg total) by mouth every 8 (eight) hours as needed for anxiety. 40 tablet 0   magic mouthwash SOLN Take 5 mLs by mouth 4 (four) times daily as needed for mouth pain (swish and spit). (Patient not taking: Reported on 04/11/2022) 240 mL 1   metFORMIN (GLUCOPHAGE) 500 MG tablet Take 1 tablet (500 mg total) by mouth 2 (two) times daily with a meal. 180 tablet 0   montelukast (SINGULAIR) 10 MG tablet Take 1 tablet (10 mg total) by mouth at bedtime. 90 tablet 1   omeprazole (PRILOSEC) 40 MG capsule Take 1 capsule (40 mg total) by mouth daily as needed. For acid reflux 90 capsule 1   ondansetron (ZOFRAN) 8 MG tablet Take 1 tablet (8 mg total) by mouth every 8 (eight) hours as needed for nausea or vomiting. 30 tablet 3   prochlorperazine (COMPAZINE) 10 MG tablet Take 10 mg by mouth every 6 (six) hours as needed for nausea.     rosuvastatin (CRESTOR) 10 MG tablet Take 1 tablet (10 mg total) by mouth daily. 90 tablet 1   tamsulosin (FLOMAX) 0.4 MG CAPS capsule Take 2 capsules (0.8 mg total) by mouth daily. 180 capsule 0   No current facility-administered medications for this  encounter.     REVIEW OF SYSTEMS: On review of systems, the patient reports that he is doing okay since chemotherapy ended. He reports frequent stools at baseline prior to his chemotherapy but had recent diarrhea from a GI bug and from chemotherapy. He denies any rectal bleeding. No other complaints are verbalized.      PHYSICAL EXAM:  Wt Readings from Last 3 Encounters:  05/11/22 256 lb (116.1 kg)  04/26/22 255 lb (115.7 kg)  04/11/22 249 lb 3.2 oz (113 kg)    Pain Assessment Pain Score: 0-No pain/10  In general this is a well appearing caucasian male in no acute distress. He's alert and oriented x4 and appropriate throughout the examination. Cardiopulmonary assessment is negative for acute distress and he exhibits normal effort.     ECOG = 1  0 - Asymptomatic (Fully active, able to carry on all predisease activities without restriction)  1 - Symptomatic but completely ambulatory (Restricted in physically strenuous activity but ambulatory and able to carry out work of a light or sedentary nature. For example, light housework, office work)  2 - Symptomatic, <50% in bed during the day (Ambulatory and capable of all self care but unable to carry out any work activities. Up and about more than 50% of waking hours)  3 - Symptomatic, >50% in bed, but not bedbound (Capable of only limited self-care, confined to bed or chair 50% or more of waking hours)  4 - Bedbound (Completely disabled. Cannot carry on any self-care. Totally confined to bed or chair)  5 - Death   Eustace Pen MM, Creech RH, Tormey DC, et al. [phone]). "Toxicity and response criteria  of the [facility]". [place] Oncol. 5 (6): 649-55    LABORATORY DATA:  Lab Results  Component Value Date   WBC 4.2 04/26/2022   HGB 12.7 (L) 04/26/2022   HCT 38.9 (L) 04/26/2022   MCV 84.7 04/26/2022   PLT 185 04/26/2022   Lab Results  Component Value Date   NA 138 04/26/2022   K 4.3 04/26/2022   CL 104  04/26/2022   CO2 25 04/26/2022   Lab Results  Component Value Date   ALT 19 04/26/2022   AST 17 04/26/2022   ALKPHOS 75 04/26/2022   BILITOT 0.4 04/26/2022      RADIOGRAPHY: No results found.     IMPRESSION/PLAN: 1. Stage IIIC, [id number] adenocarcinoma of the rectum. We reviewed his diagnosis, work up and treatment to date. Dr. Lisbeth Renshaw has also reviewed his case. He has completed total neoadjuvant chemotherapy and is now ready to proceed with chemoradiation. We discussed the risks, benefits, short, and long term effects of radiotherapy, as well as the curative intent, and the patient is interested in proceeding. Dr. Lisbeth Renshaw discusses the delivery and logistics of radiotherapy and anticipates a course of 5 1/2 weeks of radiotherapy to the pelvis. He will come in next Tuesday for simulation with plans to start treatment on 05/29/22. He will sign consent at that time to proceed. 2. Risks of pelvic floor dysfunction from radiotherapy. We discussed the importance of evaluation with physical therapy prior to pelvic radiation. A referral was placed to physical therapy today.  3. In Situ Neuro stimulator. The patient has an implanted neuro stimulator and I've reached out to our physics department about the device, as we plan therapy, and the patient will also send [place] copies of his device card.   This encounter was provided by telemedicine platform MyChart.  The patient has provided two factor identification and has given verbal consent for this type of encounter and has been advised to only accept a meeting of this type in a secure network environment. The time spent during this encounter was 45 minutes including preparation, discussion, and coordination of the patient's care. The attendants for this meeting include   Hayden Pedro  and Arty [employer] and [employer]. During the encounter,  Hayden Pedro was located at [facility] Radiation Oncology Department.   [name] [name] was located at home and his wife [name] [name] joined [place] remotely by phone as well.      Carola Rhine, [facility]   **Disclaimer: This note was dictated with voice recognition software. Similar sounding words can inadvertently be transcribed and this note may contain transcription errors which may not have been corrected upon publication of note.**

## 2022-05-10 ENCOUNTER — Telehealth: Payer: Self-pay | Admitting: Pharmacist

## 2022-05-10 NOTE — Telephone Encounter (Signed)
Oral Chemotherapy Pharmacist Encounter  Patient has had is Xeloda delivered and knows not to start until his first day of radiation.  Patient Education I spoke with patient for overview of new oral chemotherapy medication: Xeloda (capecitabine) for the neoadjuvant treatment of rectal cancer in conjunction with radiation .  Pt is doing well. Counseled patient on administration, dosing, side effects, monitoring, drug-food interactions, safe handling, storage, and disposal. Patient will take 4 tablets (2,000 mg total) by mouth 2 (two) times daily. (Total dose 4000 mg daily) Take within 30 minutes after meals. Take only on days of radiation.  Side effects include but not limited to: diarrhea, hand-foot syndrome, mouth sores, edema, decreased wbc, fatigue, N/V Diarrhea: Patient has had issues with diarrhea while on IV chemotherapy. He knows to use the lomotil he has at home as needed and to call the office if his diarrhea is uncontrollable  Hand-foot syndrome: Patient already has Udderly Smooth Extra Care 20 handy, he knows to report any skin changes/pain on his hands or feet Mouth sores: Patient has used magic mouthwash in the past and knows he can use it again if mouth sores return on capecitabine  Reviewed with patient importance of keeping a medication schedule and plan for any missed doses.  After discussion with patient no patient barriers to medication adherence identified.   Mr. Campusano voiced understanding and appreciation. All questions answered. Medication handout provided.  Provided patient with [facility] phone number. Patient knows to call the office with questions or concerns. Oral Chemotherapy [facility] will continue to follow.  Darl Pikes, PharmD, BCPS, BCOP, CPP Hematology/Oncology Clinical Pharmacist Practitioner [place]/[facility]/AP [facility] [phone]  05/10/2022 1:22 PM

## 2022-05-11 ENCOUNTER — Other Ambulatory Visit (HOSPITAL_COMMUNITY): Payer: Self-pay

## 2022-05-11 ENCOUNTER — Encounter: Payer: Self-pay | Admitting: Radiation Oncology

## 2022-05-11 ENCOUNTER — Ambulatory Visit
Admission: RE | Admit: 2022-05-11 | Discharge: 2022-05-11 | Disposition: A | Discharge status: Home / Self Care | Payer: Commercial Managed Care - PPO | Source: Ambulatory Visit | Attending: Radiation Oncology | Admitting: Radiation Oncology

## 2022-05-11 VITALS — Ht 72.0 in | Wt 256.0 lb

## 2022-05-11 DIAGNOSIS — C20 Malignant neoplasm of rectum: Secondary | ICD-10-CM | POA: Insufficient documentation

## 2022-05-11 DIAGNOSIS — Z87891 Personal history of nicotine dependence: Secondary | ICD-10-CM | POA: Diagnosis not present

## 2022-05-11 DIAGNOSIS — C787 Secondary malignant neoplasm of liver and intrahepatic bile duct: Secondary | ICD-10-CM | POA: Insufficient documentation

## 2022-05-11 DIAGNOSIS — K219 Gastro-esophageal reflux disease without esophagitis: Secondary | ICD-10-CM | POA: Insufficient documentation

## 2022-05-11 DIAGNOSIS — Z794 Long term (current) use of insulin: Secondary | ICD-10-CM | POA: Insufficient documentation

## 2022-05-11 DIAGNOSIS — Z800 Family history of malignant neoplasm of digestive organs: Secondary | ICD-10-CM | POA: Insufficient documentation

## 2022-05-11 DIAGNOSIS — Z7985 Long-term (current) use of injectable non-insulin antidiabetic drugs: Secondary | ICD-10-CM | POA: Diagnosis not present

## 2022-05-11 DIAGNOSIS — Z86010 Personal history of colonic polyps: Secondary | ICD-10-CM | POA: Insufficient documentation

## 2022-05-11 DIAGNOSIS — M129 Arthropathy, unspecified: Secondary | ICD-10-CM | POA: Insufficient documentation

## 2022-05-11 DIAGNOSIS — Z803 Family history of malignant neoplasm of breast: Secondary | ICD-10-CM | POA: Insufficient documentation

## 2022-05-11 DIAGNOSIS — Z79899 Other long term (current) drug therapy: Secondary | ICD-10-CM | POA: Insufficient documentation

## 2022-05-11 DIAGNOSIS — E119 Type 2 diabetes mellitus without complications: Secondary | ICD-10-CM | POA: Insufficient documentation

## 2022-05-11 DIAGNOSIS — Z7984 Long term (current) use of oral hypoglycemic drugs: Secondary | ICD-10-CM | POA: Diagnosis not present

## 2022-05-11 NOTE — Progress Notes (Signed)
Follow-up-new "My Chart Video" nursing interview for a 61 yr old male w/ Rectal cancer ([name] [name]).  I verified patient's identity and began nursing interview. Patient reports having diarrhea that has now subsided. No other issues reported at this time.  Meaningful use complete. Patient is taking Flomax as directed. CRS- Dr. Dema Severin  Ht 6' (1.829 m)   Wt 256 lb (116.1 kg)   BMI 34.72 kg/m   This concludes the nursing interview.  Leandra Kern, LPN'

## 2022-05-15 ENCOUNTER — Ambulatory Visit: Payer: Commercial Managed Care - PPO | Admitting: Family

## 2022-05-15 ENCOUNTER — Encounter: Payer: Self-pay | Admitting: Family

## 2022-05-15 VITALS — BP 112/80 | HR 87 | Temp 97.5°F | Ht 72.0 in | Wt 250.0 lb

## 2022-05-15 DIAGNOSIS — E785 Hyperlipidemia, unspecified: Secondary | ICD-10-CM

## 2022-05-15 DIAGNOSIS — C787 Secondary malignant neoplasm of liver and intrahepatic bile duct: Secondary | ICD-10-CM

## 2022-05-15 DIAGNOSIS — J4520 Mild intermittent asthma, uncomplicated: Secondary | ICD-10-CM | POA: Diagnosis not present

## 2022-05-15 DIAGNOSIS — Z0000 Encounter for general adult medical examination without abnormal findings: Secondary | ICD-10-CM

## 2022-05-15 DIAGNOSIS — E6609 Other obesity due to excess calories: Secondary | ICD-10-CM

## 2022-05-15 DIAGNOSIS — C20 Malignant neoplasm of rectum: Secondary | ICD-10-CM

## 2022-05-15 DIAGNOSIS — E1169 Type 2 diabetes mellitus with other specified complication: Secondary | ICD-10-CM | POA: Diagnosis not present

## 2022-05-15 DIAGNOSIS — R3914 Feeling of incomplete bladder emptying: Secondary | ICD-10-CM

## 2022-05-15 DIAGNOSIS — Z0001 Encounter for general adult medical examination with abnormal findings: Secondary | ICD-10-CM

## 2022-05-15 DIAGNOSIS — N401 Enlarged prostate with lower urinary tract symptoms: Secondary | ICD-10-CM

## 2022-05-15 DIAGNOSIS — Z6833 Body mass index (BMI) 33.0-33.9, adult: Secondary | ICD-10-CM

## 2022-05-15 DIAGNOSIS — M545 Low back pain, unspecified: Secondary | ICD-10-CM

## 2022-05-15 DIAGNOSIS — K219 Gastro-esophageal reflux disease without esophagitis: Secondary | ICD-10-CM

## 2022-05-15 DIAGNOSIS — M5136 Other intervertebral disc degeneration, lumbar region: Secondary | ICD-10-CM

## 2022-05-15 DIAGNOSIS — G8929 Other chronic pain: Secondary | ICD-10-CM

## 2022-05-15 DIAGNOSIS — M47816 Spondylosis without myelopathy or radiculopathy, lumbar region: Secondary | ICD-10-CM

## 2022-05-15 LAB — BAYER DCA HB A1C WAIVED: HB A1C (BAYER DCA - WAIVED): 7.3 % — ABNORMAL HIGH (ref 4.8–5.6)

## 2022-05-15 MED ORDER — LEVOCETIRIZINE DIHYDROCHLORIDE 5 MG PO TABS: 5.0000 mg | ORAL_TABLET | Freq: Every evening | ORAL | 1 refills | Status: DC

## 2022-05-15 NOTE — Progress Notes (Signed)
Subjective:    Patient ID: [name] [name], male    DOB: September 25, 1960, 61 y.o.   MRN: [id number]  Chief Complaint  Patient presents with   Medical Management of Chronic Issues   Pt presents to the office today for CPE and chronic follow up. He had for Spinal Cord stimulator. He states this has been life changing. He was diagnosed Rectal Cancer and completed chemo this month and starting radiation 05/29/22.  Asthma There is no cough or wheezing. This is a chronic problem. The problem occurs intermittently. The problem has been unchanged. Associated symptoms include heartburn. He reports moderate improvement on treatment. His symptoms are not alleviated by rest. His past medical history is significant for asthma.  Diabetes He presents for his follow-up diabetic visit. He has type 2 diabetes mellitus. Pertinent negatives for diabetes include no blurred vision and no foot paresthesias. Symptoms are stable. Pertinent negatives for diabetic complications include no nephropathy or peripheral neuropathy. Risk factors for coronary artery disease include dyslipidemia, diabetes mellitus, hypertension and sedentary lifestyle. He is following a generally healthy diet. His overall blood glucose range is 110-130 mg/dl. Eye exam is current.  Hyperlipidemia This is a chronic problem. The current episode started more than 1 year ago. The problem is controlled. Recent lipid tests were reviewed and are normal. Exacerbating diseases include obesity. Current antihyperlipidemic treatment includes statins. The current treatment provides moderate improvement of lipids. Risk factors for coronary artery disease include dyslipidemia, hypertension, diabetes mellitus, male sex and a sedentary lifestyle.  Gastroesophageal Reflux He complains of belching and heartburn. He reports no coughing or no wheezing. This is a chronic problem. The current episode started more than 1 year ago. The problem occurs frequently. He has tried  a PPI for the symptoms. The treatment provided moderate relief.  Back Pain This is a chronic problem. The current episode started more than 1 year ago. The problem occurs intermittently. The problem has been waxing and waning since onset. The pain is present in the lumbar spine. The quality of the pain is described as aching. The pain is at a severity of 2/10. The pain is mild. Risk factors include obesity. He has tried bed rest for the symptoms. The treatment provided mild relief.  Benign Prostatic Hypertrophy This is a chronic problem. The current episode started more than 1 year ago. The problem has been waxing and waning since onset. Irritative symptoms include nocturia (1).      Review of Systems  Eyes:  Negative for blurred vision.  Respiratory:  Negative for cough and wheezing.   Gastrointestinal:  Positive for heartburn.  Genitourinary:  Positive for nocturia (1).  Musculoskeletal:  Positive for back pain.  All other systems reviewed and are negative.  Family History  Problem Relation Age of Onset   Diabetes Mother    Breast cancer Mother 68   Diabetes Father        type II   Colon polyps Sister        11+ polyps   Other Brother        reportedly negative genetic testing   Diabetes Brother    Colon cancer Maternal Aunt    Colon cancer Maternal Uncle        x2   Colon cancer Maternal Uncle        x3   Colon cancer Maternal Uncle    Colon cancer Maternal Uncle    Colon cancer Maternal Uncle    Colon cancer Maternal Uncle  Other Maternal Uncle        farm accident as a teen   Colon cancer Maternal Grandmother    Cancer Maternal Grandmother        breast, colon    Diabetes Paternal Grandfather    Colon cancer Cousin        <50   Colon cancer Cousin        maternal cousin's children   Esophageal cancer Neg Hx    Rectal cancer Neg Hx    Stomach cancer Neg Hx    Social History   Socioeconomic History   Marital status: Married    Spouse name: Not on file    Number of children: Not on file   Years of education: Not on file   Highest education level: Not on file  Occupational History   Not on file  Tobacco Use   Smoking status: Former    Packs/day: 1.00    Types: Cigarettes    Quit date: 1987    Years since quitting: 36.9    Passive exposure: Never   Smokeless tobacco: Never  Vaping Use   Vaping Use: Never used  Substance and Sexual Activity   Alcohol use: Not Currently    Comment: occasional beer   Drug use: No   Sexual activity: Not on file  Other Topics Concern   Not on file  Social History Narrative   Not on file   Social Determinants of Health   Financial Resource Strain: Low Risk  (11/22/2021)   Overall Financial Resource Strain (CARDIA)    Difficulty of Paying Living Expenses: Not very hard  Food Insecurity: No Food Insecurity (11/22/2021)   Hunger Vital Sign    Worried About Running Out of Food in the Last Year: Never true    Ran Out of Food in the Last Year: Never true  Transportation Needs: No Transportation Needs (11/22/2021)   PRAPARE - Hydrologist (Medical): No    Lack of Transportation (Non-Medical): No  Physical Activity: Not on file  Stress: Stress Concern Present (11/22/2021)   [place]    Feeling of Stress : To some extent  Social Connections: Unknown (11/22/2021)   Social Connection and Isolation Panel [NHANES]    Frequency of Communication with Friends and Family: Three times a week    Frequency of Social Gatherings with Friends and Family: Twice a week    Attends Religious Services: Patient refused    Active Member of Clubs or Organizations: Patient refused    Attends Archivist Meetings: Patient refused    Marital Status: Married       Objective:   Physical Exam Vitals reviewed.  Constitutional:      General: He is not in acute distress.    Appearance: He is well-developed. He is obese.   HENT:     Head: Normocephalic.     Right Ear: Tympanic membrane normal.     Left Ear: Tympanic membrane normal.  Eyes:     General:        Right eye: No discharge.        Left eye: No discharge.     Pupils: Pupils are equal, round, and reactive to light.  Neck:     Thyroid: No thyromegaly.  Cardiovascular:     Rate and Rhythm: Normal rate and regular rhythm.     Heart sounds: Normal heart sounds. No murmur heard. Pulmonary:  Effort: Pulmonary effort is normal. No respiratory distress.     Breath sounds: Normal breath sounds. No wheezing.  Abdominal:     General: Bowel sounds are normal. There is no distension.     Palpations: Abdomen is soft.     Tenderness: There is no abdominal tenderness.  Musculoskeletal:        General: No tenderness. Normal range of motion.     Cervical back: Normal range of motion and neck supple.  Skin:    General: Skin is warm and dry.     Findings: No erythema or rash.  Neurological:     Mental Status: He is alert and oriented to person, place, and time.     Cranial Nerves: No cranial nerve deficit.     Deep Tendon Reflexes: Reflexes are normal and symmetric.  Psychiatric:        Behavior: Behavior normal.        Thought Content: Thought content normal.        Judgment: Judgment normal.          BP 112/80   Pulse 87   Temp (!) 97.5 F (36.4 C) (Temporal)   Ht 6' (1.829 m)   Wt 250 lb (113.4 kg)   SpO2 (!) 87%   BMI 33.91 kg/m   Assessment & Plan:  [name] [name] comes in today with chief complaint of Medical Management of Chronic Issues   Diagnosis and orders addressed:  1. Mild intermittent asthma, unspecified whether complicated  2. Rectal cancer ([place])  3. Rectal adenocarcinoma metastatic to liver ([place])  4. Type 2 diabetes mellitus with other specified complication, without long-term current use of insulin (HCC) - Bayer DCA Hb A1c Waived - Microalbumin / creatinine urine ratio  5. Class 1 obesity due to excess  calories with serious comorbidity and body mass index (BMI) of 33.0 to 33.9 in adult   6. Benign prostatic hyperplasia with incomplete bladder emptying  7. Chronic low back pain, unspecified back pain laterality, unspecified whether sciatica present  8. Dyslipidemia  9. Gastroesophageal reflux disease, unspecified whether esophagitis present  10. Lumbar spondylosis  11. Degeneration of lumbar intervertebral disc  12. Annual physical exam - Bayer DCA Hb A1c Waived - Microalbumin / creatinine urine ratio - TSH - PSA, total and free   Labs pending Health Maintenance reviewed Diet and exercise encouraged  Follow up plan: 4 months    Evelina Dun, FNP

## 2022-05-15 NOTE — Patient Instructions (Signed)
Health Maintenance, Male Adopting a healthy lifestyle and getting preventive care are important in promoting health and wellness. Ask your health care provider about: The right schedule for you to have regular tests and exams. Things you can do on your own to prevent diseases and keep yourself healthy. What should I know about diet, weight, and exercise? Eat a healthy diet  Eat a diet that includes plenty of vegetables, fruits, low-fat dairy products, and lean protein. Do not eat a lot of foods that are high in solid fats, added sugars, or sodium. Maintain a healthy weight Body mass index (BMI) is a measurement that can be used to identify possible weight problems. It estimates body fat based on height and weight. Your health care provider can help determine your BMI and help you achieve or maintain a healthy weight. Get regular exercise Get regular exercise. This is one of the most important things you can do for your health. Most adults should: Exercise for at least 150 minutes each week. The exercise should increase your heart rate and make you sweat (moderate-intensity exercise). Do strengthening exercises at least twice a week. This is in addition to the moderate-intensity exercise. Spend less time sitting. Even light physical activity can be beneficial. Watch cholesterol and blood lipids Have your blood tested for lipids and cholesterol at 62 years of age, then have this test every 5 years. You may need to have your cholesterol levels checked more often if: Your lipid or cholesterol levels are high. You are older than 62 years of age. You are at high risk for heart disease. What should I know about cancer screening? Many types of cancers can be detected early and may often be prevented. Depending on your health history and family history, you may need to have cancer screening at various ages. This may include screening for: Colorectal cancer. Prostate cancer. Skin cancer. Lung  cancer. What should I know about heart disease, diabetes, and high blood pressure? Blood pressure and heart disease High blood pressure causes heart disease and increases the risk of stroke. This is more likely to develop in people who have high blood pressure readings or are overweight. Talk with your health care provider about your target blood pressure readings. Have your blood pressure checked: Every 3-5 years if you are 18-39 years of age. Every year if you are 40 years old or older. If you are between the ages of 65 and 75 and are a current or former smoker, ask your health care provider if you should have a one-time screening for abdominal aortic aneurysm (AAA). Diabetes Have regular diabetes screenings. This checks your fasting blood sugar level. Have the screening done: Once every three years after age 45 if you are at a normal weight and have a low risk for diabetes. More often and at a younger age if you are overweight or have a high risk for diabetes. What should I know about preventing infection? Hepatitis B If you have a higher risk for hepatitis B, you should be screened for this virus. Talk with your health care provider to find out if you are at risk for hepatitis B infection. Hepatitis C Blood testing is recommended for: Everyone born from 1945 through 1965. Anyone with known risk factors for hepatitis C. Sexually transmitted infections (STIs) You should be screened each year for STIs, including gonorrhea and chlamydia, if: You are sexually active and are younger than 62 years of age. You are older than 62 years of age and your   health care provider tells you that you are at risk for this type of infection. Your sexual activity has changed since you were last screened, and you are at increased risk for chlamydia or gonorrhea. Ask your health care provider if you are at risk. Ask your health care provider about whether you are at high risk for HIV. Your health care provider  may recommend a prescription medicine to help prevent HIV infection. If you choose to take medicine to prevent HIV, you should first get tested for HIV. You should then be tested every 3 months for as long as you are taking the medicine. Follow these instructions at home: Alcohol use Do not drink alcohol if your health care provider tells you not to drink. If you drink alcohol: Limit how much you have to 0-2 drinks a day. Know how much alcohol is in your drink. In the [place] one drink equals one 12 oz bottle of beer (355 mL), one 5 oz glass of wine (148 mL), or one 1 oz glass of hard liquor (44 mL). Lifestyle Do not use any products that contain nicotine or tobacco. These products include cigarettes, chewing tobacco, and vaping devices, such as e-cigarettes. If you need help quitting, ask your health care provider. Do not use street drugs. Do not share needles. Ask your health care provider for help if you need support or information about quitting drugs. General instructions Schedule regular health, dental, and eye exams. Stay current with your vaccines. Tell your health care provider if: You often feel depressed. You have ever been abused or do not feel safe at home. Summary Adopting a healthy lifestyle and getting preventive care are important in promoting health and wellness. Follow your health care provider's instructions about healthy diet, exercising, and getting tested or screened for diseases. Follow your health care provider's instructions on monitoring your cholesterol and blood pressure. This information is not intended to replace advice given to you by your health care provider. Make sure you discuss any questions you have with your health care provider. Document Revised: 11/01/2020 Document Reviewed: 11/01/2020 Elsevier Patient Education  2023 [employer] Inc[employer]  

## 2022-05-16 ENCOUNTER — Ambulatory Visit
Admission: RE | Admit: 2022-05-16 | Discharge: 2022-05-16 | Disposition: A | Discharge status: Home / Self Care | Payer: Commercial Managed Care - PPO | Source: Ambulatory Visit | Attending: Radiation Oncology | Admitting: Radiation Oncology

## 2022-05-16 ENCOUNTER — Other Ambulatory Visit: Payer: Self-pay

## 2022-05-16 DIAGNOSIS — C20 Malignant neoplasm of rectum: Secondary | ICD-10-CM | POA: Insufficient documentation

## 2022-05-16 LAB — MICROALBUMIN / CREATININE URINE RATIO
Creatinine, Urine: 130.6 mg/dL
Microalb/Creat Ratio: 8 mg/g creat (ref 0–29)
Microalbumin, Urine: 10.5 ug/mL

## 2022-05-16 LAB — PSA, TOTAL AND FREE
PSA, Free Pct: 25 %
PSA, Free: 0.6 ng/mL
Prostate Specific Ag, Serum: 2.4 ng/mL (ref 0.0–4.0)

## 2022-05-16 LAB — TSH: TSH: 0.499 u[IU]/mL (ref 0.450–4.500)

## 2022-05-25 ENCOUNTER — Ambulatory Visit: Payer: Commercial Managed Care - PPO | Admitting: Radiation Oncology

## 2022-05-26 ENCOUNTER — Ambulatory Visit: Payer: Commercial Managed Care - PPO

## 2022-05-26 DIAGNOSIS — R110 Nausea: Secondary | ICD-10-CM | POA: Diagnosis not present

## 2022-05-26 DIAGNOSIS — C787 Secondary malignant neoplasm of liver and intrahepatic bile duct: Secondary | ICD-10-CM | POA: Diagnosis not present

## 2022-05-26 DIAGNOSIS — C20 Malignant neoplasm of rectum: Secondary | ICD-10-CM | POA: Insufficient documentation

## 2022-05-26 DIAGNOSIS — Z800 Family history of malignant neoplasm of digestive organs: Secondary | ICD-10-CM | POA: Diagnosis not present

## 2022-05-26 DIAGNOSIS — K8020 Calculus of gallbladder without cholecystitis without obstruction: Secondary | ICD-10-CM | POA: Diagnosis not present

## 2022-05-26 DIAGNOSIS — G8929 Other chronic pain: Secondary | ICD-10-CM | POA: Diagnosis not present

## 2022-05-26 DIAGNOSIS — C19 Malignant neoplasm of rectosigmoid junction: Secondary | ICD-10-CM | POA: Diagnosis not present

## 2022-05-26 DIAGNOSIS — Z803 Family history of malignant neoplasm of breast: Secondary | ICD-10-CM | POA: Diagnosis not present

## 2022-05-29 ENCOUNTER — Other Ambulatory Visit: Payer: Commercial Managed Care - PPO

## 2022-05-29 ENCOUNTER — Other Ambulatory Visit: Payer: Self-pay

## 2022-05-29 ENCOUNTER — Ambulatory Visit
Admission: RE | Admit: 2022-05-29 | Discharge: 2022-05-29 | Disposition: A | Discharge status: Home / Self Care | Payer: Commercial Managed Care - PPO | Source: Ambulatory Visit | Attending: Radiation Oncology | Admitting: Radiation Oncology

## 2022-05-29 ENCOUNTER — Inpatient Hospital Stay: Payer: Commercial Managed Care - PPO | Attending: Oncology

## 2022-05-29 ENCOUNTER — Inpatient Hospital Stay (HOSPITAL_BASED_OUTPATIENT_CLINIC_OR_DEPARTMENT_OTHER): Payer: Commercial Managed Care - PPO | Admitting: Oncology

## 2022-05-29 VITALS — BP 121/87 | HR 86 | Temp 98.2°F | Resp 18 | Wt 252.2 lb

## 2022-05-29 DIAGNOSIS — K8020 Calculus of gallbladder without cholecystitis without obstruction: Secondary | ICD-10-CM | POA: Insufficient documentation

## 2022-05-29 DIAGNOSIS — C20 Malignant neoplasm of rectum: Secondary | ICD-10-CM | POA: Insufficient documentation

## 2022-05-29 DIAGNOSIS — C19 Malignant neoplasm of rectosigmoid junction: Secondary | ICD-10-CM | POA: Diagnosis not present

## 2022-05-29 DIAGNOSIS — Z803 Family history of malignant neoplasm of breast: Secondary | ICD-10-CM | POA: Insufficient documentation

## 2022-05-29 DIAGNOSIS — R110 Nausea: Secondary | ICD-10-CM | POA: Insufficient documentation

## 2022-05-29 DIAGNOSIS — C787 Secondary malignant neoplasm of liver and intrahepatic bile duct: Secondary | ICD-10-CM | POA: Insufficient documentation

## 2022-05-29 DIAGNOSIS — Z800 Family history of malignant neoplasm of digestive organs: Secondary | ICD-10-CM | POA: Insufficient documentation

## 2022-05-29 DIAGNOSIS — G8929 Other chronic pain: Secondary | ICD-10-CM | POA: Insufficient documentation

## 2022-05-29 LAB — CBC WITH DIFFERENTIAL (CANCER CENTER ONLY)
Abs Immature Granulocytes: 0.01 10*3/uL (ref 0.00–0.07)
Basophils Absolute: 0.1 10*3/uL (ref 0.0–0.1)
Basophils Relative: 1 %
Eosinophils Absolute: 0.4 10*3/uL (ref 0.0–0.5)
Eosinophils Relative: 5 %
HCT: 48.7 % (ref 39.0–52.0)
Hemoglobin: 15.5 g/dL (ref 13.0–17.0)
Immature Granulocytes: 0 %
Lymphocytes Relative: 21 %
Lymphs Abs: 1.6 10*3/uL (ref 0.7–4.0)
MCH: 26.6 pg (ref 26.0–34.0)
MCHC: 31.8 g/dL (ref 30.0–36.0)
MCV: 83.7 fL (ref 80.0–100.0)
Monocytes Absolute: 0.8 10*3/uL (ref 0.1–1.0)
Monocytes Relative: 11 %
Neutro Abs: 4.7 10*3/uL (ref 1.7–7.7)
Neutrophils Relative %: 62 %
Platelet Count: 188 10*3/uL (ref 150–400)
RBC: 5.82 MIL/uL — ABNORMAL HIGH (ref 4.22–5.81)
RDW: 15.2 % (ref 11.5–15.5)
WBC Count: 7.6 10*3/uL (ref 4.0–10.5)
nRBC: 0 % (ref 0.0–0.2)

## 2022-05-29 LAB — CMP (CANCER CENTER ONLY)
ALT: 25 U/L (ref 0–44)
AST: 18 U/L (ref 15–41)
Albumin: 4.6 g/dL (ref 3.5–5.0)
Alkaline Phosphatase: 78 U/L (ref 38–126)
Anion gap: 8 (ref 5–15)
BUN: 14 mg/dL (ref 8–23)
CO2: 24 mmol/L (ref 22–32)
Calcium: 9.6 mg/dL (ref 8.9–10.3)
Chloride: 105 mmol/L (ref 98–111)
Creatinine: 0.9 mg/dL (ref 0.61–1.24)
GFR, Estimated: >60 mL/min (ref >60)
Glucose, Bld: 140 mg/dL — ABNORMAL HIGH (ref 70–99)
Potassium: 4.7 mmol/L (ref 3.5–5.1)
Sodium: 137 mmol/L (ref 135–145)
Total Bilirubin: 0.4 mg/dL (ref 0.3–1.2)
Total Protein: 7.4 g/dL (ref 6.5–8.1)

## 2022-05-29 LAB — RAD ONC ARIA SESSION SUMMARY
Course Elapsed Days: 0
Plan Fractions Treated to Date: 1
Plan Prescribed Dose Per Fraction: 1.8 Gy
Plan Total Fractions Prescribed: 25
Plan Total Prescribed Dose: 45 Gy
Reference Point Dosage Given to Date: 1.8 Gy
Reference Point Session Dosage Given: 1.8 Gy
Session Number: 1
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
## 2022-05-30 ENCOUNTER — Ambulatory Visit
Admission: RE | Admit: 2022-05-30 | Discharge: 2022-05-30 | Disposition: A | Discharge status: Home / Self Care | Payer: Commercial Managed Care - PPO | Source: Ambulatory Visit | Attending: Radiation Oncology | Admitting: Radiation Oncology

## 2022-05-30 ENCOUNTER — Other Ambulatory Visit: Payer: Self-pay

## 2022-05-30 DIAGNOSIS — C19 Malignant neoplasm of rectosigmoid junction: Secondary | ICD-10-CM | POA: Diagnosis not present

## 2022-05-30 LAB — RAD ONC ARIA SESSION SUMMARY
Course Elapsed Days: 1
Plan Fractions Treated to Date: 2
Plan Prescribed Dose Per Fraction: 1.8 Gy
Plan Total Fractions Prescribed: 25
Plan Total Prescribed Dose: 45 Gy
Reference Point Dosage Given to Date: 3.6 Gy
Reference Point Session Dosage Given: 1.8 Gy
Session Number: 2

## 2022-05-31 ENCOUNTER — Ambulatory Visit
Admission: RE | Admit: 2022-05-31 | Discharge: 2022-05-31 | Disposition: A | Discharge status: Home / Self Care | Payer: Commercial Managed Care - PPO | Source: Ambulatory Visit | Attending: Radiation Oncology | Admitting: Radiation Oncology

## 2022-05-31 ENCOUNTER — Other Ambulatory Visit: Payer: Self-pay

## 2022-05-31 DIAGNOSIS — C19 Malignant neoplasm of rectosigmoid junction: Secondary | ICD-10-CM | POA: Diagnosis not present

## 2022-05-31 LAB — RAD ONC ARIA SESSION SUMMARY
Course Elapsed Days: 2
Plan Fractions Treated to Date: 3
Plan Prescribed Dose Per Fraction: 1.8 Gy
Plan Total Fractions Prescribed: 25
Plan Total Prescribed Dose: 45 Gy
Reference Point Dosage Given to Date: 5.4 Gy
Reference Point Session Dosage Given: 1.8 Gy
Session Number: 3

## 2022-06-01 ENCOUNTER — Other Ambulatory Visit: Payer: Self-pay

## 2022-06-01 ENCOUNTER — Ambulatory Visit
Admission: RE | Admit: 2022-06-01 | Discharge: 2022-06-01 | Disposition: A | Discharge status: Home / Self Care | Payer: Commercial Managed Care - PPO | Source: Ambulatory Visit | Attending: Radiation Oncology | Admitting: Radiation Oncology

## 2022-06-01 DIAGNOSIS — C19 Malignant neoplasm of rectosigmoid junction: Secondary | ICD-10-CM | POA: Diagnosis not present

## 2022-06-01 LAB — RAD ONC ARIA SESSION SUMMARY
Course Elapsed Days: 3
Plan Fractions Treated to Date: 4
Plan Prescribed Dose Per Fraction: 1.8 Gy
Plan Total Fractions Prescribed: 25
Plan Total Prescribed Dose: 45 Gy
Reference Point Dosage Given to Date: 7.2 Gy
Reference Point Session Dosage Given: 1.8 Gy
Session Number: 4

## 2022-06-02 ENCOUNTER — Other Ambulatory Visit: Payer: Self-pay

## 2022-06-02 ENCOUNTER — Ambulatory Visit
Admission: RE | Admit: 2022-06-02 | Discharge: 2022-06-02 | Disposition: A | Discharge status: Home / Self Care | Payer: Commercial Managed Care - PPO | Source: Ambulatory Visit | Attending: Radiation Oncology | Admitting: Radiation Oncology

## 2022-06-02 DIAGNOSIS — C19 Malignant neoplasm of rectosigmoid junction: Secondary | ICD-10-CM | POA: Diagnosis not present

## 2022-06-02 DIAGNOSIS — C20 Malignant neoplasm of rectum: Secondary | ICD-10-CM

## 2022-06-02 LAB — RAD ONC ARIA SESSION SUMMARY
Course Elapsed Days: 4
Plan Fractions Treated to Date: 5
Plan Prescribed Dose Per Fraction: 1.8 Gy
Plan Total Fractions Prescribed: 25
Plan Total Prescribed Dose: 45 Gy
Reference Point Dosage Given to Date: 9 Gy
Reference Point Session Dosage Given: 1.8 Gy
Session Number: 5

## 2022-06-02 MED ORDER — SONAFINE EX EMUL
1.0000 | Freq: Two times a day (BID) | CUTANEOUS | Status: DC
  Administered 2022-06-02: 1 via TOPICAL

## 2022-06-05 ENCOUNTER — Other Ambulatory Visit: Payer: Self-pay

## 2022-06-05 ENCOUNTER — Ambulatory Visit
Admission: RE | Admit: 2022-06-05 | Discharge: 2022-06-05 | Disposition: A | Discharge status: Home / Self Care | Payer: Commercial Managed Care - PPO | Source: Ambulatory Visit | Attending: Radiation Oncology | Admitting: Radiation Oncology

## 2022-06-05 DIAGNOSIS — C19 Malignant neoplasm of rectosigmoid junction: Secondary | ICD-10-CM | POA: Diagnosis not present

## 2022-06-05 LAB — RAD ONC ARIA SESSION SUMMARY
Course Elapsed Days: 7
Plan Fractions Treated to Date: 6
Plan Prescribed Dose Per Fraction: 1.8 Gy
Plan Total Fractions Prescribed: 25
Plan Total Prescribed Dose: 45 Gy
Reference Point Dosage Given to Date: 10.8 Gy
Reference Point Session Dosage Given: 1.8 Gy
Session Number: 6

## 2022-06-06 ENCOUNTER — Other Ambulatory Visit: Payer: Self-pay | Admitting: Oncology

## 2022-06-06 ENCOUNTER — Telehealth: Payer: Self-pay | Admitting: *Deleted

## 2022-06-06 ENCOUNTER — Ambulatory Visit
Admission: RE | Admit: 2022-06-06 | Discharge: 2022-06-06 | Disposition: A | Discharge status: Home / Self Care | Payer: Commercial Managed Care - PPO | Source: Ambulatory Visit | Attending: Radiation Oncology | Admitting: Radiation Oncology

## 2022-06-06 ENCOUNTER — Other Ambulatory Visit: Payer: Self-pay

## 2022-06-06 ENCOUNTER — Encounter: Payer: Self-pay | Admitting: Oncology

## 2022-06-06 DIAGNOSIS — C19 Malignant neoplasm of rectosigmoid junction: Secondary | ICD-10-CM | POA: Diagnosis not present

## 2022-06-06 DIAGNOSIS — C20 Malignant neoplasm of rectum: Secondary | ICD-10-CM

## 2022-06-06 LAB — RAD ONC ARIA SESSION SUMMARY
Course Elapsed Days: 8
Plan Fractions Treated to Date: 7
Plan Prescribed Dose Per Fraction: 1.8 Gy
Plan Total Fractions Prescribed: 25
Plan Total Prescribed Dose: 45 Gy
Reference Point Dosage Given to Date: 12.6 Gy
Reference Point Session Dosage Given: 1.8 Gy
Session Number: 7

## 2022-06-06 NOTE — Telephone Encounter (Signed)
Received refill request for Xeloda from specialty pharmacy. Confirmed w/patient that he has enough to complete his radiation treatments.

## 2022-06-06 NOTE — Telephone Encounter (Signed)
Patient reports he has enough Xeloda to complete RT

## 2022-06-07 ENCOUNTER — Other Ambulatory Visit: Payer: Self-pay

## 2022-06-07 ENCOUNTER — Ambulatory Visit
Admission: RE | Admit: 2022-06-07 | Discharge: 2022-06-07 | Disposition: A | Discharge status: Home / Self Care | Payer: Commercial Managed Care - PPO | Source: Ambulatory Visit | Attending: Radiation Oncology | Admitting: Radiation Oncology

## 2022-06-07 DIAGNOSIS — C19 Malignant neoplasm of rectosigmoid junction: Secondary | ICD-10-CM | POA: Diagnosis not present

## 2022-06-07 LAB — RAD ONC ARIA SESSION SUMMARY
Course Elapsed Days: 9
Plan Fractions Treated to Date: 8
Plan Prescribed Dose Per Fraction: 1.8 Gy
Plan Total Fractions Prescribed: 25
Plan Total Prescribed Dose: 45 Gy
Reference Point Dosage Given to Date: 14.4 Gy
Reference Point Session Dosage Given: 1.8 Gy
Session Number: 8

## 2022-06-08 ENCOUNTER — Encounter: Payer: Self-pay | Admitting: Oncology

## 2022-06-08 ENCOUNTER — Ambulatory Visit
Admission: RE | Admit: 2022-06-08 | Discharge: 2022-06-08 | Disposition: A | Discharge status: Home / Self Care | Payer: Commercial Managed Care - PPO | Source: Ambulatory Visit | Attending: Radiation Oncology | Admitting: Radiation Oncology

## 2022-06-08 ENCOUNTER — Other Ambulatory Visit: Payer: Self-pay

## 2022-06-08 DIAGNOSIS — C19 Malignant neoplasm of rectosigmoid junction: Secondary | ICD-10-CM | POA: Diagnosis not present

## 2022-06-08 LAB — RAD ONC ARIA SESSION SUMMARY
Course Elapsed Days: 10
Plan Fractions Treated to Date: 9
Plan Prescribed Dose Per Fraction: 1.8 Gy
Plan Total Fractions Prescribed: 25
Plan Total Prescribed Dose: 45 Gy
Reference Point Dosage Given to Date: 16.2 Gy
Reference Point Session Dosage Given: 1.8 Gy
Session Number: 9

## 2022-06-09 ENCOUNTER — Ambulatory Visit
Admission: RE | Admit: 2022-06-09 | Discharge: 2022-06-09 | Disposition: A | Discharge status: Home / Self Care | Payer: Commercial Managed Care - PPO | Source: Ambulatory Visit | Attending: Radiation Oncology | Admitting: Radiation Oncology

## 2022-06-09 ENCOUNTER — Other Ambulatory Visit: Payer: Self-pay

## 2022-06-09 DIAGNOSIS — C19 Malignant neoplasm of rectosigmoid junction: Secondary | ICD-10-CM | POA: Diagnosis not present

## 2022-06-09 LAB — RAD ONC ARIA SESSION SUMMARY
Course Elapsed Days: 11
Plan Fractions Treated to Date: 10
Plan Prescribed Dose Per Fraction: 1.8 Gy
Plan Total Fractions Prescribed: 25
Plan Total Prescribed Dose: 45 Gy
Reference Point Dosage Given to Date: 18 Gy
Reference Point Session Dosage Given: 1.8 Gy
Session Number: 10

## 2022-06-10 ENCOUNTER — Encounter: Payer: Self-pay | Admitting: Oncology

## 2022-06-12 ENCOUNTER — Inpatient Hospital Stay (HOSPITAL_BASED_OUTPATIENT_CLINIC_OR_DEPARTMENT_OTHER): Payer: Commercial Managed Care - PPO | Admitting: Nurse Practitioner

## 2022-06-12 ENCOUNTER — Encounter: Payer: Self-pay | Admitting: Nurse Practitioner

## 2022-06-12 ENCOUNTER — Ambulatory Visit
Admission: RE | Admit: 2022-06-12 | Discharge: 2022-06-12 | Disposition: A | Discharge status: Home / Self Care | Payer: Commercial Managed Care - PPO | Source: Ambulatory Visit | Attending: Radiation Oncology | Admitting: Radiation Oncology

## 2022-06-12 ENCOUNTER — Inpatient Hospital Stay: Payer: Commercial Managed Care - PPO

## 2022-06-12 ENCOUNTER — Other Ambulatory Visit: Payer: Self-pay

## 2022-06-12 VITALS — BP 133/83 | HR 110 | Temp 98.2°F | Resp 18 | Ht 72.0 in | Wt 252.8 lb

## 2022-06-12 DIAGNOSIS — C20 Malignant neoplasm of rectum: Secondary | ICD-10-CM

## 2022-06-12 DIAGNOSIS — C19 Malignant neoplasm of rectosigmoid junction: Secondary | ICD-10-CM | POA: Diagnosis not present

## 2022-06-12 DIAGNOSIS — Z95828 Presence of other vascular implants and grafts: Secondary | ICD-10-CM

## 2022-06-12 LAB — CMP (CANCER CENTER ONLY)
ALT: 21 U/L (ref 0–44)
AST: 15 U/L (ref 15–41)
Albumin: 4.5 g/dL (ref 3.5–5.0)
Alkaline Phosphatase: 57 U/L (ref 38–126)
Anion gap: 11 (ref 5–15)
BUN: 14 mg/dL (ref 8–23)
CO2: 22 mmol/L (ref 22–32)
Calcium: 9.2 mg/dL (ref 8.9–10.3)
Chloride: 103 mmol/L (ref 98–111)
Creatinine: 0.83 mg/dL (ref 0.61–1.24)
GFR, Estimated: >60 mL/min (ref >60)
Glucose, Bld: 248 mg/dL — ABNORMAL HIGH (ref 70–99)
Potassium: 4 mmol/L (ref 3.5–5.1)
Sodium: 136 mmol/L (ref 135–145)
Total Bilirubin: 0.5 mg/dL (ref 0.3–1.2)
Total Protein: 7.1 g/dL (ref 6.5–8.1)

## 2022-06-12 LAB — RAD ONC ARIA SESSION SUMMARY
Course Elapsed Days: 14
Plan Fractions Treated to Date: 11
Plan Prescribed Dose Per Fraction: 1.8 Gy
Plan Total Fractions Prescribed: 25
Plan Total Prescribed Dose: 45 Gy
Reference Point Dosage Given to Date: 19.8 Gy
Reference Point Session Dosage Given: 1.8 Gy
Session Number: 11

## 2022-06-12 LAB — CBC WITH DIFFERENTIAL (CANCER CENTER ONLY)
Abs Immature Granulocytes: 0.02 10*3/uL (ref 0.00–0.07)
Basophils Absolute: 0 10*3/uL (ref 0.0–0.1)
Basophils Relative: 1 %
Eosinophils Absolute: 0.3 10*3/uL (ref 0.0–0.5)
Eosinophils Relative: 7 %
HCT: 45.4 % (ref 39.0–52.0)
Hemoglobin: 15.1 g/dL (ref 13.0–17.0)
Immature Granulocytes: 1 %
Lymphocytes Relative: 16 %
Lymphs Abs: 0.6 10*3/uL — ABNORMAL LOW (ref 0.7–4.0)
MCH: 27.6 pg (ref 26.0–34.0)
MCHC: 33.3 g/dL (ref 30.0–36.0)
MCV: 83 fL (ref 80.0–100.0)
Monocytes Absolute: 0.3 10*3/uL (ref 0.1–1.0)
Monocytes Relative: 7 %
Neutro Abs: 2.5 10*3/uL (ref 1.7–7.7)
Neutrophils Relative %: 68 %
Platelet Count: 135 10*3/uL — ABNORMAL LOW (ref 150–400)
RBC: 5.47 MIL/uL (ref 4.22–5.81)
RDW: 17 % — ABNORMAL HIGH (ref 11.5–15.5)
WBC Count: 3.7 10*3/uL — ABNORMAL LOW (ref 4.0–10.5)
nRBC: 0 % (ref 0.0–0.2)

## 2022-06-12 LAB — CEA (ACCESS): CEA (CHCC): 1.69 ng/mL (ref 0.00–5.00)

## 2022-06-12 MED ORDER — SODIUM CHLORIDE 0.9% FLUSH
10.0000 mL | INTRAVENOUS | Status: DC | PRN
  Administered 2022-06-12: 10 mL via INTRAVENOUS

## 2022-06-12 MED ORDER — HEPARIN SOD (PORK) LOCK FLUSH 100 UNIT/ML IV SOLN
500.0000 [IU] | Freq: Once | INTRAVENOUS | Status: AC
  Administered 2022-06-12: 500 [IU] via INTRAVENOUS
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
## 2022-06-13 ENCOUNTER — Ambulatory Visit
Admission: RE | Admit: 2022-06-13 | Discharge: 2022-06-13 | Disposition: A | Discharge status: Home / Self Care | Payer: Commercial Managed Care - PPO | Source: Ambulatory Visit | Attending: Radiation Oncology | Admitting: Radiation Oncology

## 2022-06-13 ENCOUNTER — Other Ambulatory Visit: Payer: Self-pay

## 2022-06-13 DIAGNOSIS — C19 Malignant neoplasm of rectosigmoid junction: Secondary | ICD-10-CM | POA: Diagnosis not present

## 2022-06-13 LAB — RAD ONC ARIA SESSION SUMMARY
Course Elapsed Days: 15
Plan Fractions Treated to Date: 12
Plan Prescribed Dose Per Fraction: 1.8 Gy
Plan Total Fractions Prescribed: 25
Plan Total Prescribed Dose: 45 Gy
Reference Point Dosage Given to Date: 21.6 Gy
Reference Point Session Dosage Given: 1.8 Gy
Session Number: 12

## 2022-06-14 ENCOUNTER — Ambulatory Visit
Admission: RE | Admit: 2022-06-14 | Discharge: 2022-06-14 | Disposition: A | Discharge status: Home / Self Care | Payer: Commercial Managed Care - PPO | Source: Ambulatory Visit | Attending: Radiation Oncology | Admitting: Radiation Oncology

## 2022-06-14 ENCOUNTER — Other Ambulatory Visit: Payer: Self-pay

## 2022-06-14 DIAGNOSIS — C19 Malignant neoplasm of rectosigmoid junction: Secondary | ICD-10-CM | POA: Diagnosis not present

## 2022-06-14 LAB — RAD ONC ARIA SESSION SUMMARY
Course Elapsed Days: 16
Plan Fractions Treated to Date: 13
Plan Prescribed Dose Per Fraction: 1.8 Gy
Plan Total Fractions Prescribed: 25
Plan Total Prescribed Dose: 45 Gy
Reference Point Dosage Given to Date: 23.4 Gy
Reference Point Session Dosage Given: 1.8 Gy
Session Number: 13

## 2022-06-15 ENCOUNTER — Other Ambulatory Visit: Payer: Self-pay | Admitting: *Deleted

## 2022-06-15 ENCOUNTER — Ambulatory Visit
Admission: RE | Admit: 2022-06-15 | Discharge: 2022-06-15 | Disposition: A | Discharge status: Home / Self Care | Payer: Commercial Managed Care - PPO | Source: Ambulatory Visit | Attending: Radiation Oncology | Admitting: Radiation Oncology

## 2022-06-15 ENCOUNTER — Other Ambulatory Visit: Payer: Self-pay

## 2022-06-15 DIAGNOSIS — C19 Malignant neoplasm of rectosigmoid junction: Secondary | ICD-10-CM | POA: Diagnosis not present

## 2022-06-15 DIAGNOSIS — C20 Malignant neoplasm of rectum: Secondary | ICD-10-CM

## 2022-06-15 LAB — RAD ONC ARIA SESSION SUMMARY
Course Elapsed Days: 17
Plan Fractions Treated to Date: 14
Plan Prescribed Dose Per Fraction: 1.8 Gy
Plan Total Fractions Prescribed: 25
Plan Total Prescribed Dose: 45 Gy
Reference Point Dosage Given to Date: 25.2 Gy
Reference Point Session Dosage Given: 1.8 Gy
Session Number: 14

## 2022-06-16 ENCOUNTER — Ambulatory Visit
Admission: RE | Admit: 2022-06-16 | Discharge: 2022-06-16 | Disposition: A | Discharge status: Home / Self Care | Payer: Commercial Managed Care - PPO | Source: Ambulatory Visit | Attending: Radiation Oncology | Admitting: Radiation Oncology

## 2022-06-16 ENCOUNTER — Other Ambulatory Visit: Payer: Self-pay

## 2022-06-16 DIAGNOSIS — C19 Malignant neoplasm of rectosigmoid junction: Secondary | ICD-10-CM | POA: Diagnosis not present

## 2022-06-16 LAB — RAD ONC ARIA SESSION SUMMARY
Course Elapsed Days: 18
Plan Fractions Treated to Date: 15
Plan Prescribed Dose Per Fraction: 1.8 Gy
Plan Total Fractions Prescribed: 25
Plan Total Prescribed Dose: 45 Gy
Reference Point Dosage Given to Date: 27 Gy
Reference Point Session Dosage Given: 1.8 Gy
Session Number: 15

## 2022-06-18 ENCOUNTER — Other Ambulatory Visit: Payer: Self-pay

## 2022-06-18 ENCOUNTER — Emergency Department (HOSPITAL_BASED_OUTPATIENT_CLINIC_OR_DEPARTMENT_OTHER): Payer: Commercial Managed Care - PPO

## 2022-06-18 ENCOUNTER — Emergency Department (HOSPITAL_BASED_OUTPATIENT_CLINIC_OR_DEPARTMENT_OTHER)
Admission: EM | Admit: 2022-06-18 | Discharge: 2022-06-19 | Disposition: A | Discharge status: Home / Self Care | Payer: Commercial Managed Care - PPO | Attending: Emergency Medicine | Admitting: Emergency Medicine

## 2022-06-18 DIAGNOSIS — R1013 Epigastric pain: Secondary | ICD-10-CM

## 2022-06-18 DIAGNOSIS — Z794 Long term (current) use of insulin: Secondary | ICD-10-CM | POA: Diagnosis not present

## 2022-06-18 DIAGNOSIS — C20 Malignant neoplasm of rectum: Secondary | ICD-10-CM | POA: Diagnosis not present

## 2022-06-18 DIAGNOSIS — C787 Secondary malignant neoplasm of liver and intrahepatic bile duct: Secondary | ICD-10-CM | POA: Diagnosis not present

## 2022-06-18 DIAGNOSIS — K8021 Calculus of gallbladder without cholecystitis with obstruction: Secondary | ICD-10-CM | POA: Diagnosis not present

## 2022-06-18 LAB — CBC
HCT: 47.5 % (ref 39.0–52.0)
Hemoglobin: 15.6 g/dL (ref 13.0–17.0)
MCH: 27.3 pg (ref 26.0–34.0)
MCHC: 32.8 g/dL (ref 30.0–36.0)
MCV: 83 fL (ref 80.0–100.0)
Platelets: 104 10*3/uL — ABNORMAL LOW (ref 150–400)
RBC: 5.72 MIL/uL (ref 4.22–5.81)
RDW: 18.6 % — ABNORMAL HIGH (ref 11.5–15.5)
WBC: 5.9 10*3/uL (ref 4.0–10.5)
nRBC: 0 % (ref 0.0–0.2)

## 2022-06-18 LAB — BASIC METABOLIC PANEL
Anion gap: 14 (ref 5–15)
BUN: 13 mg/dL (ref 8–23)
CO2: 22 mmol/L (ref 22–32)
Calcium: 10 mg/dL (ref 8.9–10.3)
Chloride: 101 mmol/L (ref 98–111)
Creatinine, Ser: 0.84 mg/dL (ref 0.61–1.24)
GFR, Estimated: >60 mL/min (ref >60)
Glucose, Bld: 188 mg/dL — ABNORMAL HIGH (ref 70–99)
Potassium: 3.9 mmol/L (ref 3.5–5.1)
Sodium: 137 mmol/L (ref 135–145)

## 2022-06-18 LAB — TROPONIN I (HIGH SENSITIVITY): Troponin I (High Sensitivity): 4 ng/L (ref <18)

## 2022-06-18 MED ORDER — ONDANSETRON HCL 4 MG/2ML IJ SOLN
4.0000 mg | Freq: Once | INTRAMUSCULAR | Status: AC
  Administered 2022-06-18: 4 mg via INTRAVENOUS
  Filled 2022-06-18: qty 2

## 2022-06-18 MED ORDER — IOHEXOL 350 MG/ML SOLN
100.0000 mL | Freq: Once | INTRAVENOUS | Status: AC | PRN
  Administered 2022-06-19: 100 mL via INTRAVENOUS

## 2022-06-18 MED ORDER — HYDROMORPHONE HCL 1 MG/ML IJ SOLN
1.0000 mg | Freq: Once | INTRAMUSCULAR | Status: AC
  Administered 2022-06-18: 1 mg via INTRAVENOUS
  Filled 2022-06-18: qty 1

## 2022-06-18 MED ORDER — LACTATED RINGERS IV BOLUS
1000.0000 mL | Freq: Once | INTRAVENOUS | Status: AC
  Administered 2022-06-19: 1000 mL via INTRAVENOUS

## 2022-06-18 NOTE — ED Notes (Signed)
Dr. Langston Masker provided with repeat EKG -- provider made aware of pt location and status

## 2022-06-18 NOTE — ED Notes (Signed)
Pt awake and alert; GCS 15.  Pt tachypneic RR 26 - O2 sats stable.  Continuous cardiac and pulse ox maintained.   Results unremarkable (trop, cbc, bmp, EKG) - will continue to monitor for acute changes and maintain plan of care as pt awaits ED provider.

## 2022-06-18 NOTE — ED Notes (Signed)
Pt now oob to hall bathroom -- urinal has been provided

## 2022-06-18 NOTE — ED Triage Notes (Signed)
Pt states he thinks he's having a heart attack; states around 1530 suddenly onset central CP, "like something heavy on my chest." Denies radiation, NV, diaphoresis. Multiple OTC medications for indigestion, no relief.

## 2022-06-18 NOTE — ED Notes (Signed)
ED Provider at bedside. 

## 2022-06-18 NOTE — ED Notes (Signed)
Rad Tech at bedside peforming PCXR -- pt awake and alert; no obvious distress noted.  Has been placed on cardiac and pulse ox monitoring by ED tech prior to this nurse making contact with pt

## 2022-06-18 NOTE — ED Provider Notes (Signed)
[name] [name]  Provider Note  CSN: [id number] Arrival date & time: 06/18/22 2137  History Chief Complaint  Patient presents with   Chest Pain    [name] [name] is a 61 y.o. male with history of metastatic rectal cancer, has had chemo, a partial liver resection and now also getting radiation treatments to rectum. He reports several hours of gradually worsening epigastric and chest pain, feels like someone sitting on his chest. Not associated with SOB. Some nausea, but no vomiting. He thought it was indigestion and took several OTC meds without improvement. He has not had any fever. No prior heart problems.    Home Medications Prior to Admission medications   Medication Sig Start Date End Date Taking? Authorizing Provider  ondansetron (ZOFRAN-ODT) 4 MG disintegrating tablet Take 1 tablet (4 mg total) by mouth every 8 (eight) hours as needed for nausea or vomiting. 06/19/22  Yes Truddie Hidden, MD  oxyCODONE-acetaminophen (PERCOCET/ROXICET) 5-325 MG tablet Take 1 tablet by mouth every 6 (six) hours as needed for severe pain. 06/19/22  Yes Truddie Hidden, MD  acetaminophen (TYLENOL) 500 MG tablet Take 1,000 mg by mouth every 6 (six) hours as needed for fever.    [provider]  albuterol (VENTOLIN HFA) 108 (90 Base) MCG/ACT inhaler Inhale 2 puffs into the lungs every 6 (six) hours as needed for wheezing or shortness of breath. Patient not taking: Reported on 03/29/2022 08/09/20   Evelina Dun A, FNP  capecitabine (XELODA) 500 MG tablet Take 4 tablets (2,000 mg total) by mouth 2 (two) times daily. (Total dose 4000 mg daily) Take within 30 minutes after meals. Take only on days of radiation 04/26/22   Ladell Pier, MD  Continuous Blood Gluc Sensor (FREESTYLE LIBRE 3 SENSOR) MISC Place 1 sensor on the skin every 14 days. Use to check glucose continuously.  DX: E11.65 02/16/22   Evelina Dun A, FNP  diphenoxylate-atropine (LOMOTIL) 2.5-0.025  MG tablet Take 1-2 tablets by mouth 4 (four) times daily as needed for diarrhea or loose stools. Patient not taking: Reported on 05/29/2022 04/26/22   Owens Shark, NP  Dulaglutide (TRULICITY) 1.5 TG/6.2IR SOPN Inject 1.5 mg into the skin once a week. 12/30/21   Sharion Balloon, FNP  empagliflozin (JARDIANCE) 25 MG TABS tablet Take 1 tablet (25 mg total) by mouth daily before breakfast. 04/05/22   Evelina Dun A, FNP  fluticasone (FLONASE) 50 MCG/ACT nasal spray Place 2 sprays into both nostrils daily. Patient not taking: Reported on 05/29/2022 03/21/21   Evelina Dun A, FNP  insulin glargine, 1 Unit Dial, (TOUJEO SOLOSTAR) 300 UNIT/ML Solostar Pen Inject 30 Units into the skin at bedtime. 02/09/22   Evelina Dun A, FNP  insulin lispro (HUMALOG KWIKPEN) 100 UNIT/ML KwikPen Inject 8 Units into the skin 3 (three) times daily. Patient not taking: Reported on 05/29/2022 02/09/22   Evelina Dun A, FNP  Insulin Pen Needle 29G X 5MM MISC 1 Application by Does not apply route at bedtime. 01/02/22   Sharion Balloon, FNP  levocetirizine (XYZAL) 5 MG tablet Take 1 tablet (5 mg total) by mouth every evening. 05/15/22   Sharion Balloon, FNP  loperamide (IMODIUM A-D) 2 MG tablet Take 2-4 mg by mouth 4 (four) times daily as needed for diarrhea or loose stools. Patient not taking: Reported on 05/29/2022    [provider]  LORazepam (ATIVAN) 0.5 MG tablet Take 1 tablet (0.5 mg total) by mouth every 8 (eight) hours as needed  for anxiety. 05/04/22   Owens Shark, NP  magic mouthwash SOLN Take 5 mLs by mouth 4 (four) times daily as needed for mouth pain (swish and spit). Patient not taking: Reported on 05/29/2022 02/22/22   Ladell Pier, MD  metFORMIN (GLUCOPHAGE) 500 MG tablet Take 1 tablet (500 mg total) by mouth 2 (two) times daily with a meal. 03/31/22   Hawks, Alyse Low A, FNP  montelukast (SINGULAIR) 10 MG tablet Take 1 tablet (10 mg total) by mouth at bedtime. 03/31/22   Sharion Balloon, FNP   omeprazole (PRILOSEC) 40 MG capsule Take 1 capsule (40 mg total) by mouth daily as needed. For acid reflux Patient not taking: Reported on 05/15/2022 02/09/22   Evelina Dun A, FNP  ondansetron (ZOFRAN) 8 MG tablet Take 1 tablet (8 mg total) by mouth every 8 (eight) hours as needed for nausea or vomiting. 05/03/22   Ladell Pier, MD  prochlorperazine (COMPAZINE) 10 MG tablet Take 10 mg by mouth every 6 (six) hours as needed for nausea. Patient not taking: Reported on 05/29/2022 03/30/22   [provider]  rosuvastatin (CRESTOR) 10 MG tablet Take 1 tablet (10 mg total) by mouth daily. 12/30/21   Sharion Balloon, FNP  tamsulosin (FLOMAX) 0.4 MG CAPS capsule Take 2 capsules (0.8 mg total) by mouth daily. 03/31/22   Sharion Balloon, FNP     Allergies    Other   Review of Systems   Review of Systems Please see HPI for pertinent positives and negatives  Physical Exam BP (!) 151/95   Pulse 91   Temp 97.7 F (36.5 C) (Oral)   Resp 18   SpO2 94%   Physical Exam Vitals and nursing note reviewed.  Constitutional:      Appearance: Normal appearance.  HENT:     Head: Normocephalic and atraumatic.     Nose: Nose normal.     Mouth/Throat:     Mouth: Mucous membranes are moist.  Eyes:     Extraocular Movements: Extraocular movements intact.     Conjunctiva/sclera: Conjunctivae normal.  Cardiovascular:     Rate and Rhythm: Normal rate.  Pulmonary:     Effort: Pulmonary effort is normal.     Breath sounds: Normal breath sounds.  Abdominal:     General: Abdomen is flat.     Palpations: Abdomen is soft.     Tenderness: There is abdominal tenderness (epigastric, no RUQ tenderness). There is no guarding or rebound.  Musculoskeletal:        General: No swelling. Normal range of motion.     Cervical back: Neck supple.  Skin:    General: Skin is warm and dry.  Neurological:     General: No focal deficit present.     Mental Status: He is alert.  Psychiatric:        Mood and  Affect: Mood normal.     ED Results / Procedures / Treatments   EKG None  Procedures Procedures  Medications Ordered in the ED Medications  oxyCODONE-acetaminophen (PERCOCET/ROXICET) 5-325 MG per tablet 2 tablet (has no administration in time range)  HYDROmorphone (DILAUDID) injection 1 mg (1 mg Intravenous Given 06/18/22 2347)  ondansetron (ZOFRAN) injection 4 mg (4 mg Intravenous Given 06/18/22 2347)  lactated ringers bolus 1,000 mL (1,000 mLs Intravenous New Bag/Given 06/19/22 0009)  iohexol (OMNIPAQUE) 350 MG/ML injection 100 mL (100 mLs Intravenous Contrast Given 06/19/22 0009)  HYDROmorphone (DILAUDID) injection 1 mg (1 mg Intravenous Given 06/19/22 0043)  Initial Impression and Plan  Patient here with chief complaint chest pain, but more focally having pain in epigastric area. He is currently being treated for rectal cancer, prior liver mets have been resected. Initial labs done in triage for chest pain show normal CBC, BMP and Trop. Will add LFTs and lipase. I personally viewed the images from radiology studies and agree with radiologist interpretation: CXR is clear. Given his history of active cancer, will send for CTA of chest to rule out PE and CTAP to eval hepatobiliary process. Pain/nausea meds and IVF for comfort.   ED Course   Clinical Course as of 06/19/22 0151  Mon Jun 19, 2022  0122 I personally viewed the images from radiology studies and agree with radiologist interpretation: CT neg for PE. There are some gall stones but no other signs of cholecystitis or obstruction. Rectal mass is larger. No other acute findings to explain his pain.  [CS]  0123 LFTs and lipase are normal.  [CS]  0147 Patient reports overall pain has improved but not yet resolved. Repeat Trop remains normal. Discussed lab and CT findings. No clear etiology identified, could be gall stones, but no signs of obstruction or infection. Offered admission for further pain control and imaging but he  prefers to go home. Rx for pain and nausea medications as needed. He has appointment with Surgery scheduled for 12/27, advised to RTED sooner if symptoms return.  [CS]    Clinical Course User Index [CS] Truddie Hidden, MD     MDM Rules/Calculators/A&P Medical Decision Making Problems Addressed: Calculus of gallbladder with biliary obstruction but without cholecystitis: acute illness or injury Epigastric pain: acute illness or injury  Amount and/or Complexity of Data Reviewed Labs: ordered. Decision-making details documented in ED Course. Radiology: ordered and independent interpretation performed. Decision-making details documented in ED Course. ECG/medicine tests: ordered and independent interpretation performed. Decision-making details documented in ED Course.  Risk Prescription drug management. Parenteral controlled substances. Decision regarding hospitalization.    Final Clinical Impression(s) / ED Diagnoses Final diagnoses:  Epigastric pain  Calculus of gallbladder with biliary obstruction but without cholecystitis    Rx / DC Orders ED Discharge Orders          Ordered    oxyCODONE-acetaminophen (PERCOCET/ROXICET) 5-325 MG tablet  Every 6 hours PRN        06/19/22 0150    ondansetron (ZOFRAN-ODT) 4 MG disintegrating tablet  Every 8 hours PRN        06/19/22 0150             Truddie Hidden, MD 06/19/22 0151

## 2022-06-18 NOTE — ED Notes (Signed)
Pt oob to hall bathroom - -after returning from bathroom - pt appears anxious.  Reports this pain is worse than any of his surgeries he has ever had or any pain he has ever experienced.  Dr. Karle Starch will be notified via secure chat.

## 2022-06-19 LAB — HEPATIC FUNCTION PANEL
ALT: 18 U/L (ref 0–44)
AST: 19 U/L (ref 15–41)
Albumin: 4.7 g/dL (ref 3.5–5.0)
Alkaline Phosphatase: 56 U/L (ref 38–126)
Bilirubin, Direct: 0.1 mg/dL (ref 0.0–0.2)
Indirect Bilirubin: 0.6 mg/dL (ref 0.3–0.9)
Total Bilirubin: 0.7 mg/dL (ref 0.3–1.2)
Total Protein: 7.4 g/dL (ref 6.5–8.1)

## 2022-06-19 LAB — LIPASE, BLOOD: Lipase: 27 U/L (ref 11–51)

## 2022-06-19 LAB — TROPONIN I (HIGH SENSITIVITY): Troponin I (High Sensitivity): 3 ng/L (ref <18)

## 2022-06-19 MED ORDER — HYDROMORPHONE HCL 1 MG/ML IJ SOLN
1.0000 mg | Freq: Once | INTRAMUSCULAR | Status: AC
  Administered 2022-06-19: 1 mg via INTRAVENOUS
  Filled 2022-06-19: qty 1

## 2022-06-19 MED ORDER — OXYCODONE-ACETAMINOPHEN 5-325 MG PO TABS: 1.0000 | ORAL_TABLET | Freq: Four times a day (QID) | ORAL | 0 refills | Status: DC | PRN

## 2022-06-19 MED ORDER — ONDANSETRON 4 MG PO TBDP: 4.0000 mg | ORAL_TABLET | Freq: Three times a day (TID) | ORAL | 0 refills | Status: DC | PRN

## 2022-06-19 MED ORDER — OXYCODONE-ACETAMINOPHEN 5-325 MG PO TABS
2.0000 | ORAL_TABLET | Freq: Once | ORAL | Status: AC
  Administered 2022-06-19: 2 via ORAL
  Filled 2022-06-19: qty 2

## 2022-06-19 NOTE — ED Notes (Signed)
Patient transported to CT via w/c. 

## 2022-06-19 NOTE — ED Notes (Signed)
Pt agreeable with d/c plan as discussed by provider- this nurse has verbally reinforced d/c instructions and provided pt with written copy - pt acknowledges verbal understanding and denies any addl questions, concerns, needs- pt declines w/c at discharge; ambulatory independently with steady gait accompanied by wife; vitals within baseline; no acute changes or distress

## 2022-06-19 NOTE — ED Notes (Signed)
Pt now returned from CT dept via w/c -- no acute changes; cardiac and pulse ox monitoring resumed

## 2022-06-20 ENCOUNTER — Other Ambulatory Visit: Payer: Self-pay

## 2022-06-20 ENCOUNTER — Ambulatory Visit
Admission: RE | Admit: 2022-06-20 | Discharge: 2022-06-20 | Disposition: A | Discharge status: Home / Self Care | Payer: Commercial Managed Care - PPO | Source: Ambulatory Visit | Attending: Radiation Oncology | Admitting: Radiation Oncology

## 2022-06-20 ENCOUNTER — Telehealth: Payer: Self-pay

## 2022-06-20 DIAGNOSIS — C19 Malignant neoplasm of rectosigmoid junction: Secondary | ICD-10-CM | POA: Diagnosis not present

## 2022-06-20 DIAGNOSIS — C20 Malignant neoplasm of rectum: Secondary | ICD-10-CM

## 2022-06-20 LAB — RAD ONC ARIA SESSION SUMMARY
Course Elapsed Days: 22
Plan Fractions Treated to Date: 16
Plan Prescribed Dose Per Fraction: 1.8 Gy
Plan Total Fractions Prescribed: 25
Plan Total Prescribed Dose: 45 Gy
Reference Point Dosage Given to Date: 28.8 Gy
Reference Point Session Dosage Given: 1.8 Gy
Session Number: 16

## 2022-06-20 NOTE — Telephone Encounter (Signed)
Pt reported urgency to radiation staff today and this concern was forwarded to Geoffry Paradise RN. Juliette Mangle spoke with Dr. Tammi Klippel about this concern. Dr. Tammi Klippel is requesting a uranalysis and culture due to pt symptoms. Pt will come in tomorrow prior to radiation to get this done. On call back with update pt reported that he felt like he was not taking his Flomax at all (with this symptoms) and that he was urinating about every 20 minutes. Rn will acquire with Dr. Tammi Klippel to seek if a PUT appointment will be necessary tomorrow for this pt as well based on symptoms.

## 2022-06-21 ENCOUNTER — Other Ambulatory Visit: Payer: Self-pay

## 2022-06-21 ENCOUNTER — Telehealth: Payer: Self-pay

## 2022-06-21 ENCOUNTER — Ambulatory Visit
Admission: RE | Admit: 2022-06-21 | Discharge: 2022-06-21 | Disposition: A | Discharge status: Home / Self Care | Payer: Commercial Managed Care - PPO | Source: Ambulatory Visit | Attending: Radiation Oncology | Admitting: Radiation Oncology

## 2022-06-21 DIAGNOSIS — C20 Malignant neoplasm of rectum: Secondary | ICD-10-CM

## 2022-06-21 DIAGNOSIS — C19 Malignant neoplasm of rectosigmoid junction: Secondary | ICD-10-CM | POA: Diagnosis not present

## 2022-06-21 LAB — RAD ONC ARIA SESSION SUMMARY
Course Elapsed Days: 23
Plan Fractions Treated to Date: 17
Plan Prescribed Dose Per Fraction: 1.8 Gy
Plan Total Fractions Prescribed: 25
Plan Total Prescribed Dose: 45 Gy
Reference Point Dosage Given to Date: 30.6 Gy
Reference Point Session Dosage Given: 1.8 Gy
Session Number: 17

## 2022-06-21 LAB — URINALYSIS, COMPLETE (UACMP) WITH MICROSCOPIC
Glucose, UA: >=500 mg/dL — AB
Ketones, ur: 80 mg/dL — AB
Leukocytes,Ua: NEGATIVE
Nitrite: NEGATIVE
Protein, ur: NEGATIVE mg/dL
Specific Gravity, Urine: 1.025 (ref 1.005–1.030)
pH: 6 (ref 5.0–8.0)

## 2022-06-21 NOTE — Telephone Encounter (Signed)
Rn Anderson Malta called pt back concerning his urinary s/s and urine test results from this am. Pt stated in this phone call that he felt his diabetes was under control as his AIC was 7.1 and his blood glucose is 120 most of the time. He reported that his blood sugar is 160 after eating in the morning and that the urine test was done after he ate. When asked about arranging follow up with his PCP, he stated he would call himself if he wanted to. He felt like the radiation was causing the s/s based on the educational book we provided him. He continued to state that he knew DM would not cause the burning he is feeling at time. Rn acknowledged this as well.  Rn offered to call his PCP and arrange for follow up for DM,  but he refused this request. He did again mention he had not ever seen an urologist. Rn will touch base with Ashyln to see if she feels like a urology consult is needed for this pt.

## 2022-06-21 NOTE — Telephone Encounter (Signed)
Rn Anderson Malta called pt back with further advice from [employer] PA concerning his urinary burning, urgency, and frequency. He was receptive to taking Azo (his wife had actually suggested that to him earlier as well), though he would not take it. He states he will take it now. He also stated that he would get an appointment with his PCP for further evaluation as well. Rn Anderson Malta mentioned that his kidneys need to be monitored as well.  He however did not want to see urology at this. He stated he is seeing a lot of doctors right now and will touch base with [place] if this becomes a need. He did state that his s/s are improving but would take the advice. He stated he would call us with any further concerns or questions.

## 2022-06-22 ENCOUNTER — Ambulatory Visit
Admission: RE | Admit: 2022-06-22 | Discharge: 2022-06-22 | Disposition: A | Discharge status: Home / Self Care | Payer: Commercial Managed Care - PPO | Source: Ambulatory Visit | Attending: Radiation Oncology | Admitting: Radiation Oncology

## 2022-06-22 ENCOUNTER — Other Ambulatory Visit: Payer: Self-pay

## 2022-06-22 DIAGNOSIS — C19 Malignant neoplasm of rectosigmoid junction: Secondary | ICD-10-CM | POA: Diagnosis not present

## 2022-06-22 LAB — RAD ONC ARIA SESSION SUMMARY
Course Elapsed Days: 24
Plan Fractions Treated to Date: 18
Plan Prescribed Dose Per Fraction: 1.8 Gy
Plan Total Fractions Prescribed: 25
Plan Total Prescribed Dose: 45 Gy
Reference Point Dosage Given to Date: 32.4 Gy
Reference Point Session Dosage Given: 1.8 Gy
Session Number: 18

## 2022-06-22 LAB — URINE CULTURE: Culture: NO GROWTH

## 2022-06-22 NOTE — Progress Notes (Signed)
Please call patient with normal result.  Thanks. MM 

## 2022-06-23 ENCOUNTER — Inpatient Hospital Stay (HOSPITAL_BASED_OUTPATIENT_CLINIC_OR_DEPARTMENT_OTHER): Payer: Commercial Managed Care - PPO | Admitting: Oncology

## 2022-06-23 ENCOUNTER — Other Ambulatory Visit: Payer: Self-pay

## 2022-06-23 ENCOUNTER — Other Ambulatory Visit: Payer: Self-pay | Admitting: *Deleted

## 2022-06-23 ENCOUNTER — Inpatient Hospital Stay: Payer: Commercial Managed Care - PPO

## 2022-06-23 ENCOUNTER — Ambulatory Visit
Admission: RE | Admit: 2022-06-23 | Discharge: 2022-06-23 | Disposition: A | Discharge status: Home / Self Care | Payer: Commercial Managed Care - PPO | Source: Ambulatory Visit | Attending: Radiation Oncology | Admitting: Radiation Oncology

## 2022-06-23 VITALS — BP 132/70 | HR 91 | Temp 98.2°F | Resp 20 | Ht 72.0 in | Wt 248.2 lb

## 2022-06-23 DIAGNOSIS — C20 Malignant neoplasm of rectum: Secondary | ICD-10-CM | POA: Diagnosis not present

## 2022-06-23 DIAGNOSIS — C19 Malignant neoplasm of rectosigmoid junction: Secondary | ICD-10-CM | POA: Diagnosis not present

## 2022-06-23 LAB — CMP (CANCER CENTER ONLY)
ALT: 17 U/L (ref 0–44)
AST: 16 U/L (ref 15–41)
Albumin: 3.8 g/dL (ref 3.5–5.0)
Alkaline Phosphatase: 82 U/L (ref 38–126)
Anion gap: 15 (ref 5–15)
BUN: 11 mg/dL (ref 8–23)
CO2: 19 mmol/L — ABNORMAL LOW (ref 22–32)
Calcium: 9.3 mg/dL (ref 8.9–10.3)
Chloride: 100 mmol/L (ref 98–111)
Creatinine: 0.83 mg/dL (ref 0.61–1.24)
GFR, Estimated: >60 mL/min (ref >60)
Glucose, Bld: 225 mg/dL — ABNORMAL HIGH (ref 70–99)
Potassium: 3.5 mmol/L (ref 3.5–5.1)
Sodium: 134 mmol/L — ABNORMAL LOW (ref 135–145)
Total Bilirubin: 0.7 mg/dL (ref 0.3–1.2)
Total Protein: 7 g/dL (ref 6.5–8.1)

## 2022-06-23 LAB — CBC WITH DIFFERENTIAL (CANCER CENTER ONLY)
Abs Immature Granulocytes: 0.03 10*3/uL (ref 0.00–0.07)
Basophils Absolute: 0 10*3/uL (ref 0.0–0.1)
Basophils Relative: 1 %
Eosinophils Absolute: 0.2 10*3/uL (ref 0.0–0.5)
Eosinophils Relative: 3 %
HCT: 37.1 % — ABNORMAL LOW (ref 39.0–52.0)
Hemoglobin: 12.3 g/dL — ABNORMAL LOW (ref 13.0–17.0)
Immature Granulocytes: 1 %
Lymphocytes Relative: 2 %
Lymphs Abs: 0.1 10*3/uL — ABNORMAL LOW (ref 0.7–4.0)
MCH: 27.2 pg (ref 26.0–34.0)
MCHC: 33.2 g/dL (ref 30.0–36.0)
MCV: 82.1 fL (ref 80.0–100.0)
Monocytes Absolute: 0.5 10*3/uL (ref 0.1–1.0)
Monocytes Relative: 8 %
Neutro Abs: 4.8 10*3/uL (ref 1.7–7.7)
Neutrophils Relative %: 85 %
Platelet Count: 112 10*3/uL — ABNORMAL LOW (ref 150–400)
RBC: 4.52 MIL/uL (ref 4.22–5.81)
RDW: 18.3 % — ABNORMAL HIGH (ref 11.5–15.5)
WBC Count: 5.7 10*3/uL (ref 4.0–10.5)
nRBC: 0 % (ref 0.0–0.2)

## 2022-06-23 LAB — RAD ONC ARIA SESSION SUMMARY
Course Elapsed Days: 25
Plan Fractions Treated to Date: 19
Plan Prescribed Dose Per Fraction: 1.8 Gy
Plan Total Fractions Prescribed: 25
Plan Total Prescribed Dose: 45 Gy
Reference Point Dosage Given to Date: 34.2 Gy
Reference Point Session Dosage Given: 1.8 Gy
Session Number: 19

## 2022-06-23 MED ORDER — OXYCODONE-ACETAMINOPHEN 5-325 MG PO TABS: 1.0000 | ORAL_TABLET | Freq: Four times a day (QID) | ORAL | 0 refills | Status: DC | PRN

## 2022-06-23 MED ORDER — SUCRALFATE 1 G PO TABS: 1.0000 g | ORAL_TABLET | Freq: Three times a day (TID) | ORAL | 1 refills | Status: DC

## 2022-06-23 NOTE — Progress Notes (Signed)
Confirmed w/oncology pharmacist that no interaction between Xeloda and Carafate.
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
## 2022-06-27 ENCOUNTER — Other Ambulatory Visit: Payer: Self-pay

## 2022-06-27 ENCOUNTER — Ambulatory Visit
Admission: RE | Admit: 2022-06-27 | Discharge: 2022-06-27 | Disposition: A | Discharge status: Home / Self Care | Payer: Commercial Managed Care - PPO | Source: Ambulatory Visit | Attending: Radiation Oncology | Admitting: Radiation Oncology

## 2022-06-27 ENCOUNTER — Telehealth: Payer: Self-pay | Admitting: Family

## 2022-06-27 DIAGNOSIS — Z803 Family history of malignant neoplasm of breast: Secondary | ICD-10-CM | POA: Diagnosis not present

## 2022-06-27 DIAGNOSIS — C19 Malignant neoplasm of rectosigmoid junction: Secondary | ICD-10-CM | POA: Diagnosis present

## 2022-06-27 DIAGNOSIS — N401 Enlarged prostate with lower urinary tract symptoms: Secondary | ICD-10-CM

## 2022-06-27 DIAGNOSIS — C20 Malignant neoplasm of rectum: Secondary | ICD-10-CM | POA: Insufficient documentation

## 2022-06-27 DIAGNOSIS — R300 Dysuria: Secondary | ICD-10-CM | POA: Diagnosis not present

## 2022-06-27 DIAGNOSIS — C787 Secondary malignant neoplasm of liver and intrahepatic bile duct: Secondary | ICD-10-CM | POA: Diagnosis not present

## 2022-06-27 DIAGNOSIS — R350 Frequency of micturition: Secondary | ICD-10-CM | POA: Diagnosis not present

## 2022-06-27 DIAGNOSIS — Z800 Family history of malignant neoplasm of digestive organs: Secondary | ICD-10-CM | POA: Diagnosis not present

## 2022-06-27 LAB — RAD ONC ARIA SESSION SUMMARY
Course Elapsed Days: 29
Plan Fractions Treated to Date: 20
Plan Prescribed Dose Per Fraction: 1.8 Gy
Plan Total Fractions Prescribed: 25
Plan Total Prescribed Dose: 45 Gy
Reference Point Dosage Given to Date: 36 Gy
Reference Point Session Dosage Given: 1.8 Gy
Session Number: 20

## 2022-06-27 MED ORDER — ROSUVASTATIN CALCIUM 10 MG PO TABS: 10.0000 mg | ORAL_TABLET | Freq: Every day | ORAL | 1 refills | Status: DC

## 2022-06-27 MED ORDER — TAMSULOSIN HCL 0.4 MG PO CAPS: 0.8000 mg | ORAL_CAPSULE | Freq: Every day | ORAL | 0 refills | Status: DC

## 2022-06-27 NOTE — Telephone Encounter (Signed)
  Prescription Request  06/27/2022  Is this a "Controlled Substance" medicine? no  Have you seen your PCP in the last 2 weeks? No pt has appt on 11/16/22 with Christy for 6 month f/u  If YES, route message to pool  -  If NO, patient needs to be scheduled for appointment.  What is the name of the medication or equipment? Metformin rosuvastatin (CRESTOR) 10 MG tablet  Jardiance tamsulosin (FLOMAX) 0.4 MG CAPS capsule   Have you contacted your pharmacy to request a refill? yes   Which pharmacy would you like this sent to? [facility]   Patient notified that their request is being sent to the clinical staff for review and that they should receive a response within 2 business days.

## 2022-06-27 NOTE — Telephone Encounter (Signed)
Patient aware and verbalized understanding. Sent in patient aware

## 2022-06-28 ENCOUNTER — Other Ambulatory Visit: Payer: Self-pay

## 2022-06-28 ENCOUNTER — Ambulatory Visit
Admission: RE | Admit: 2022-06-28 | Discharge: 2022-06-28 | Disposition: A | Discharge status: Home / Self Care | Payer: Commercial Managed Care - PPO | Source: Ambulatory Visit | Attending: Radiation Oncology | Admitting: Radiation Oncology

## 2022-06-28 DIAGNOSIS — C19 Malignant neoplasm of rectosigmoid junction: Secondary | ICD-10-CM | POA: Diagnosis not present

## 2022-06-28 LAB — RAD ONC ARIA SESSION SUMMARY
Course Elapsed Days: 30
Plan Fractions Treated to Date: 21
Plan Prescribed Dose Per Fraction: 1.8 Gy
Plan Total Fractions Prescribed: 25
Plan Total Prescribed Dose: 45 Gy
Reference Point Dosage Given to Date: 37.8 Gy
Reference Point Session Dosage Given: 1.8 Gy
Session Number: 21

## 2022-06-29 ENCOUNTER — Ambulatory Visit
Admission: RE | Admit: 2022-06-29 | Discharge: 2022-06-29 | Disposition: A | Discharge status: Home / Self Care | Payer: Commercial Managed Care - PPO | Source: Ambulatory Visit | Attending: Radiation Oncology | Admitting: Radiation Oncology

## 2022-06-29 ENCOUNTER — Other Ambulatory Visit: Payer: Self-pay | Admitting: *Deleted

## 2022-06-29 ENCOUNTER — Other Ambulatory Visit: Payer: Self-pay

## 2022-06-29 DIAGNOSIS — C19 Malignant neoplasm of rectosigmoid junction: Secondary | ICD-10-CM | POA: Diagnosis not present

## 2022-06-29 DIAGNOSIS — E1165 Type 2 diabetes mellitus with hyperglycemia: Secondary | ICD-10-CM

## 2022-06-29 LAB — RAD ONC ARIA SESSION SUMMARY
Course Elapsed Days: 31
Plan Fractions Treated to Date: 22
Plan Prescribed Dose Per Fraction: 1.8 Gy
Plan Total Fractions Prescribed: 25
Plan Total Prescribed Dose: 45 Gy
Reference Point Dosage Given to Date: 39.6 Gy
Reference Point Session Dosage Given: 1.8 Gy
Session Number: 22

## 2022-06-29 MED ORDER — EMPAGLIFLOZIN 25 MG PO TABS: 25.0000 mg | ORAL_TABLET | Freq: Every day | ORAL | 1 refills | Status: DC

## 2022-06-29 MED ORDER — METFORMIN HCL 500 MG PO TABS: 500.0000 mg | ORAL_TABLET | Freq: Two times a day (BID) | ORAL | 1 refills | Status: DC

## 2022-06-30 ENCOUNTER — Ambulatory Visit
Admission: RE | Admit: 2022-06-30 | Discharge: 2022-06-30 | Disposition: A | Discharge status: Home / Self Care | Payer: Commercial Managed Care - PPO | Source: Ambulatory Visit | Attending: Radiation Oncology | Admitting: Radiation Oncology

## 2022-06-30 ENCOUNTER — Other Ambulatory Visit: Payer: Self-pay

## 2022-06-30 DIAGNOSIS — C19 Malignant neoplasm of rectosigmoid junction: Secondary | ICD-10-CM | POA: Diagnosis not present

## 2022-06-30 LAB — RAD ONC ARIA SESSION SUMMARY
Course Elapsed Days: 32
Plan Fractions Treated to Date: 23
Plan Prescribed Dose Per Fraction: 1.8 Gy
Plan Total Fractions Prescribed: 25
Plan Total Prescribed Dose: 45 Gy
Reference Point Dosage Given to Date: 41.4 Gy
Reference Point Session Dosage Given: 1.8 Gy
Session Number: 23

## 2022-07-03 ENCOUNTER — Ambulatory Visit
Admission: RE | Admit: 2022-07-03 | Discharge: 2022-07-03 | Disposition: A | Discharge status: Home / Self Care | Payer: Commercial Managed Care - PPO | Source: Ambulatory Visit | Attending: Radiation Oncology | Admitting: Radiation Oncology

## 2022-07-03 ENCOUNTER — Ambulatory Visit: Payer: Commercial Managed Care - PPO

## 2022-07-03 ENCOUNTER — Other Ambulatory Visit: Payer: Self-pay

## 2022-07-03 DIAGNOSIS — C19 Malignant neoplasm of rectosigmoid junction: Secondary | ICD-10-CM | POA: Diagnosis not present

## 2022-07-03 LAB — RAD ONC ARIA SESSION SUMMARY
Course Elapsed Days: 35
Plan Fractions Treated to Date: 24
Plan Prescribed Dose Per Fraction: 1.8 Gy
Plan Total Fractions Prescribed: 25
Plan Total Prescribed Dose: 45 Gy
Reference Point Dosage Given to Date: 43.2 Gy
Reference Point Session Dosage Given: 1.8 Gy
Session Number: 24

## 2022-07-04 ENCOUNTER — Ambulatory Visit
Admission: RE | Admit: 2022-07-04 | Discharge: 2022-07-04 | Disposition: A | Discharge status: Home / Self Care | Payer: Commercial Managed Care - PPO | Source: Ambulatory Visit | Attending: Radiation Oncology | Admitting: Radiation Oncology

## 2022-07-04 ENCOUNTER — Other Ambulatory Visit: Payer: Self-pay

## 2022-07-04 DIAGNOSIS — C19 Malignant neoplasm of rectosigmoid junction: Secondary | ICD-10-CM | POA: Diagnosis not present

## 2022-07-04 LAB — RAD ONC ARIA SESSION SUMMARY
Course Elapsed Days: 36
Plan Fractions Treated to Date: 25
Plan Prescribed Dose Per Fraction: 1.8 Gy
Plan Total Fractions Prescribed: 25
Plan Total Prescribed Dose: 45 Gy
Reference Point Dosage Given to Date: 45 Gy
Reference Point Session Dosage Given: 1.8 Gy
Session Number: 25

## 2022-07-05 ENCOUNTER — Ambulatory Visit
Admission: RE | Admit: 2022-07-05 | Discharge: 2022-07-05 | Disposition: A | Discharge status: Home / Self Care | Payer: Commercial Managed Care - PPO | Source: Ambulatory Visit | Attending: Radiation Oncology | Admitting: Radiation Oncology

## 2022-07-05 ENCOUNTER — Other Ambulatory Visit: Payer: Self-pay

## 2022-07-05 ENCOUNTER — Ambulatory Visit: Payer: Commercial Managed Care - PPO

## 2022-07-05 DIAGNOSIS — C19 Malignant neoplasm of rectosigmoid junction: Secondary | ICD-10-CM | POA: Diagnosis not present

## 2022-07-05 LAB — RAD ONC ARIA SESSION SUMMARY
Course Elapsed Days: 37
Plan Fractions Treated to Date: 1
Plan Prescribed Dose Per Fraction: 1.8 Gy
Plan Total Fractions Prescribed: 3
Plan Total Prescribed Dose: 5.4 Gy
Reference Point Dosage Given to Date: 1.8 Gy
Reference Point Session Dosage Given: 1.8 Gy
Session Number: 26

## 2022-07-06 ENCOUNTER — Other Ambulatory Visit: Payer: Self-pay

## 2022-07-06 ENCOUNTER — Encounter: Payer: Self-pay | Admitting: Nurse Practitioner

## 2022-07-06 ENCOUNTER — Ambulatory Visit
Admission: RE | Admit: 2022-07-06 | Discharge: 2022-07-06 | Disposition: A | Discharge status: Home / Self Care | Payer: Commercial Managed Care - PPO | Source: Ambulatory Visit | Attending: Radiation Oncology | Admitting: Radiation Oncology

## 2022-07-06 ENCOUNTER — Inpatient Hospital Stay: Payer: Commercial Managed Care - PPO | Attending: Oncology

## 2022-07-06 ENCOUNTER — Inpatient Hospital Stay: Payer: Commercial Managed Care - PPO | Admitting: Nurse Practitioner

## 2022-07-06 VITALS — BP 121/81 | HR 81 | Temp 98.1°F | Resp 18 | Ht 72.0 in | Wt 240.2 lb

## 2022-07-06 DIAGNOSIS — C20 Malignant neoplasm of rectum: Secondary | ICD-10-CM | POA: Diagnosis not present

## 2022-07-06 DIAGNOSIS — Z803 Family history of malignant neoplasm of breast: Secondary | ICD-10-CM | POA: Insufficient documentation

## 2022-07-06 DIAGNOSIS — R300 Dysuria: Secondary | ICD-10-CM | POA: Insufficient documentation

## 2022-07-06 DIAGNOSIS — C19 Malignant neoplasm of rectosigmoid junction: Secondary | ICD-10-CM | POA: Diagnosis not present

## 2022-07-06 DIAGNOSIS — C787 Secondary malignant neoplasm of liver and intrahepatic bile duct: Secondary | ICD-10-CM | POA: Insufficient documentation

## 2022-07-06 DIAGNOSIS — Z800 Family history of malignant neoplasm of digestive organs: Secondary | ICD-10-CM | POA: Insufficient documentation

## 2022-07-06 DIAGNOSIS — R350 Frequency of micturition: Secondary | ICD-10-CM | POA: Insufficient documentation

## 2022-07-06 LAB — RAD ONC ARIA SESSION SUMMARY
Course Elapsed Days: 38
Plan Fractions Treated to Date: 2
Plan Prescribed Dose Per Fraction: 1.8 Gy
Plan Total Fractions Prescribed: 3
Plan Total Prescribed Dose: 5.4 Gy
Reference Point Dosage Given to Date: 3.6 Gy
Reference Point Session Dosage Given: 1.8 Gy
Session Number: 27

## 2022-07-06 LAB — CBC WITH DIFFERENTIAL (CANCER CENTER ONLY)
Abs Immature Granulocytes: 0.03 10*3/uL (ref 0.00–0.07)
Basophils Absolute: 0.1 10*3/uL (ref 0.0–0.1)
Basophils Relative: 2 %
Eosinophils Absolute: 0.3 10*3/uL (ref 0.0–0.5)
Eosinophils Relative: 6 %
HCT: 43 % (ref 39.0–52.0)
Hemoglobin: 14 g/dL (ref 13.0–17.0)
Immature Granulocytes: 1 %
Lymphocytes Relative: 9 %
Lymphs Abs: 0.4 10*3/uL — ABNORMAL LOW (ref 0.7–4.0)
MCH: 27.1 pg (ref 26.0–34.0)
MCHC: 32.6 g/dL (ref 30.0–36.0)
MCV: 83.2 fL (ref 80.0–100.0)
Monocytes Absolute: 0.4 10*3/uL (ref 0.1–1.0)
Monocytes Relative: 10 %
Neutro Abs: 3.2 10*3/uL (ref 1.7–7.7)
Neutrophils Relative %: 72 %
Platelet Count: 255 10*3/uL (ref 150–400)
RBC: 5.17 MIL/uL (ref 4.22–5.81)
RDW: 18.8 % — ABNORMAL HIGH (ref 11.5–15.5)
WBC Count: 4.4 10*3/uL (ref 4.0–10.5)
nRBC: 0 % (ref 0.0–0.2)

## 2022-07-06 LAB — CMP (CANCER CENTER ONLY)
ALT: 25 U/L (ref 0–44)
AST: 19 U/L (ref 15–41)
Albumin: 4.3 g/dL (ref 3.5–5.0)
Alkaline Phosphatase: 76 U/L (ref 38–126)
Anion gap: 9 (ref 5–15)
BUN: 15 mg/dL (ref 8–23)
CO2: 23 mmol/L (ref 22–32)
Calcium: 9.7 mg/dL (ref 8.9–10.3)
Chloride: 104 mmol/L (ref 98–111)
Creatinine: 0.91 mg/dL (ref 0.61–1.24)
GFR, Estimated: >60 mL/min (ref >60)
Glucose, Bld: 200 mg/dL — ABNORMAL HIGH (ref 70–99)
Potassium: 4.7 mmol/L (ref 3.5–5.1)
Sodium: 136 mmol/L (ref 135–145)
Total Bilirubin: 0.8 mg/dL (ref 0.3–1.2)
Total Protein: 7.2 g/dL (ref 6.5–8.1)

## 2022-07-06 MED ORDER — DIPHENOXYLATE-ATROPINE 2.5-0.025 MG PO TABS: 1.0000 | ORAL_TABLET | Freq: Four times a day (QID) | ORAL | 0 refills | Status: DC | PRN

## 2022-07-06 MED ORDER — OXYCODONE-ACETAMINOPHEN 5-325 MG PO TABS: 1.0000 | ORAL_TABLET | Freq: Four times a day (QID) | ORAL | 0 refills | Status: DC | PRN
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
## 2022-07-07 ENCOUNTER — Encounter: Payer: Self-pay | Admitting: Radiation Oncology

## 2022-07-07 ENCOUNTER — Ambulatory Visit
Admission: RE | Admit: 2022-07-07 | Discharge: 2022-07-07 | Disposition: A | Discharge status: Home / Self Care | Payer: Commercial Managed Care - PPO | Source: Ambulatory Visit | Attending: Radiation Oncology | Admitting: Radiation Oncology

## 2022-07-07 ENCOUNTER — Other Ambulatory Visit: Payer: Self-pay

## 2022-07-07 DIAGNOSIS — C19 Malignant neoplasm of rectosigmoid junction: Secondary | ICD-10-CM | POA: Diagnosis not present

## 2022-07-07 LAB — RAD ONC ARIA SESSION SUMMARY
Course Elapsed Days: 39
Plan Fractions Treated to Date: 3
Plan Prescribed Dose Per Fraction: 1.8 Gy
Plan Total Fractions Prescribed: 3
Plan Total Prescribed Dose: 5.4 Gy
Reference Point Dosage Given to Date: 5.4 Gy
Reference Point Session Dosage Given: 1.8 Gy
Session Number: 28

## 2022-07-10 ENCOUNTER — Encounter: Payer: Self-pay | Admitting: Oncology

## 2022-07-10 NOTE — Progress Notes (Signed)
                                                                                                                                                             Patient Name: [name] [name] MRN: [id number] DOB: 09/18/1960 Referring Physician: [name] (Profile Not Attached) Date of Service: 07/07/2022 [facility], [place]                                                        End Of Treatment Note  Diagnoses: C20-Malignant neoplasm of rectum  Cancer Staging: Stage IIIC, [id number] adenocarcinoma of the rectum   Intent: Curative  Radiation Treatment Dates: 05/29/2022 through 07/07/2022 Site Technique Total Dose (Gy) Dose per Fx (Gy) Completed Fx Beam Energies  Rectum: Rectum 3D 45/45 1.8 25/25 6X  Rectum: Rectum_Bst 3D 5.4/5.4 1.8 3/3 6X   Narrative: The patient tolerated radiation therapy relatively well. He developed fatigue and nausea relieved by antiemetics.   Plan: The patient will receive a call in about one month from the radiation oncology department. He will continue follow up with Dr. Benay Spice as well as Dr. Dema Severin whom he's scheduled with for surgery in March.   ________________________________________________    Carola Rhine, [facility]

## 2022-08-03 ENCOUNTER — Inpatient Hospital Stay: Payer: Commercial Managed Care - PPO | Admitting: Oncology

## 2022-08-03 ENCOUNTER — Inpatient Hospital Stay: Payer: Commercial Managed Care - PPO

## 2022-08-03 ENCOUNTER — Inpatient Hospital Stay: Payer: Commercial Managed Care - PPO | Attending: Oncology

## 2022-08-03 VITALS — BP 120/82 | HR 94 | Temp 98.2°F | Resp 18 | Ht 72.0 in | Wt 249.0 lb

## 2022-08-03 DIAGNOSIS — C787 Secondary malignant neoplasm of liver and intrahepatic bile duct: Secondary | ICD-10-CM | POA: Diagnosis not present

## 2022-08-03 DIAGNOSIS — R3911 Hesitancy of micturition: Secondary | ICD-10-CM | POA: Insufficient documentation

## 2022-08-03 DIAGNOSIS — Z923 Personal history of irradiation: Secondary | ICD-10-CM | POA: Insufficient documentation

## 2022-08-03 DIAGNOSIS — C19 Malignant neoplasm of rectosigmoid junction: Secondary | ICD-10-CM | POA: Insufficient documentation

## 2022-08-03 DIAGNOSIS — C20 Malignant neoplasm of rectum: Secondary | ICD-10-CM | POA: Diagnosis not present

## 2022-08-03 DIAGNOSIS — Z800 Family history of malignant neoplasm of digestive organs: Secondary | ICD-10-CM | POA: Diagnosis not present

## 2022-08-03 DIAGNOSIS — Z803 Family history of malignant neoplasm of breast: Secondary | ICD-10-CM | POA: Diagnosis not present

## 2022-08-03 DIAGNOSIS — Z95828 Presence of other vascular implants and grafts: Secondary | ICD-10-CM

## 2022-08-03 DIAGNOSIS — G8929 Other chronic pain: Secondary | ICD-10-CM | POA: Insufficient documentation

## 2022-08-03 LAB — CMP (CANCER CENTER ONLY)
ALT: 19 U/L (ref 0–44)
AST: 14 U/L — ABNORMAL LOW (ref 15–41)
Albumin: 4.4 g/dL (ref 3.5–5.0)
Alkaline Phosphatase: 63 U/L (ref 38–126)
Anion gap: 11 (ref 5–15)
BUN: 14 mg/dL (ref 8–23)
CO2: 22 mmol/L (ref 22–32)
Calcium: 9.1 mg/dL (ref 8.9–10.3)
Chloride: 104 mmol/L (ref 98–111)
Creatinine: 0.96 mg/dL (ref 0.61–1.24)
GFR, Estimated: >60 mL/min (ref >60)
Glucose, Bld: 273 mg/dL — ABNORMAL HIGH (ref 70–99)
Potassium: 4.1 mmol/L (ref 3.5–5.1)
Sodium: 137 mmol/L (ref 135–145)
Total Bilirubin: 0.7 mg/dL (ref 0.3–1.2)
Total Protein: 6.9 g/dL (ref 6.5–8.1)

## 2022-08-03 LAB — CBC WITH DIFFERENTIAL (CANCER CENTER ONLY)
Abs Immature Granulocytes: 0.01 10*3/uL (ref 0.00–0.07)
Basophils Absolute: 0 10*3/uL (ref 0.0–0.1)
Basophils Relative: 1 %
Eosinophils Absolute: 0.2 10*3/uL (ref 0.0–0.5)
Eosinophils Relative: 6 %
HCT: 40.1 % (ref 39.0–52.0)
Hemoglobin: 13.2 g/dL (ref 13.0–17.0)
Immature Granulocytes: 0 %
Lymphocytes Relative: 10 %
Lymphs Abs: 0.3 10*3/uL — ABNORMAL LOW (ref 0.7–4.0)
MCH: 29.4 pg (ref 26.0–34.0)
MCHC: 32.9 g/dL (ref 30.0–36.0)
MCV: 89.3 fL (ref 80.0–100.0)
Monocytes Absolute: 0.3 10*3/uL (ref 0.1–1.0)
Monocytes Relative: 9 %
Neutro Abs: 2.3 10*3/uL (ref 1.7–7.7)
Neutrophils Relative %: 74 %
Platelet Count: 163 10*3/uL (ref 150–400)
RBC: 4.49 MIL/uL (ref 4.22–5.81)
RDW: 20.8 % — ABNORMAL HIGH (ref 11.5–15.5)
WBC Count: 3.2 10*3/uL — ABNORMAL LOW (ref 4.0–10.5)
nRBC: 0 % (ref 0.0–0.2)

## 2022-08-03 LAB — CEA (ACCESS): CEA (CHCC): 1.63 ng/mL (ref 0.00–5.00)

## 2022-08-03 MED ORDER — HEPARIN SOD (PORK) LOCK FLUSH 100 UNIT/ML IV SOLN
500.0000 [IU] | Freq: Once | INTRAVENOUS | Status: AC
  Administered 2022-08-03: 500 [IU] via INTRAVENOUS

## 2022-08-03 MED ORDER — SODIUM CHLORIDE 0.9% FLUSH
10.0000 mL | Freq: Once | INTRAVENOUS | Status: AC
  Administered 2022-08-03: 10 mL via INTRAVENOUS
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
## 2022-08-16 NOTE — Progress Notes (Signed)
Sent message, via epic in basket, requesting orders in epic from surgeon.  

## 2022-08-21 ENCOUNTER — Ambulatory Visit: Payer: Self-pay | Admitting: Surgery

## 2022-08-21 ENCOUNTER — Ambulatory Visit
Admission: RE | Admit: 2022-08-21 | Discharge: 2022-08-21 | Disposition: A | Discharge status: Home / Self Care | Payer: Commercial Managed Care - PPO | Source: Ambulatory Visit | Attending: Radiation Oncology | Admitting: Radiation Oncology

## 2022-08-21 DIAGNOSIS — Z01818 Encounter for other preprocedural examination: Secondary | ICD-10-CM

## 2022-08-21 NOTE — Progress Notes (Signed)
  Radiation Oncology         [phone] ________________________________  Name: [name] [name] MRN: [id number]  Date of Service: 08/21/2022  DOB: [birth year]  Post Treatment Telephone Note  Diagnosis:  Stage IIIC, [id number] adenocarcinoma of the rectum   Intent: Curative  Radiation Treatment Dates: 05/29/2022 through 07/07/2022 Site Technique Total Dose (Gy) Dose per Fx (Gy) Completed Fx Beam Energies  Rectum: Rectum 3D 45/45 1.8 25/25 6X  Rectum: Rectum_Bst 3D 5.4/5.4 1.8 3/3 6X   (as documented in provider EOT note)   The patient was available for call today.   Symptoms of fatigue have improved since completing therapy.  Symptoms of skin changes have improved since completing therapy.   Symptoms of bladder changes have improved since completing therapy.  Symptoms of bowel changes have improved since completing therapy.  The patient is scheduled for follow up and surgery with colorectal surgeon Dr. Dema Severin in March 2024.  The patient has scheduled follow up with his medical oncologist Dr. Benay Spice and their surgeon Dr. Dema Severin for ongoing surveillance. he  was encouraged to call if he  develop concerns or questions regarding radiation.   This concludes the interview.   Leandra Kern, LPN

## 2022-08-21 NOTE — Progress Notes (Signed)
Second request for orders in [facility]: Left voicemail on nurse triage line.

## 2022-08-22 NOTE — Patient Instructions (Addendum)
SURGICAL WAITING ROOM VISITATION  Patients having surgery or a procedure may have no more than 2 support people in the waiting area - these visitors may rotate.    Children under the age of 38 must have an adult with them who is not the patient.  Due to an increase in RSV and influenza rates and associated hospitalizations, children ages 16 and under may not visit patients in [place].  If the patient needs to stay at the hospital during part of their recovery, the visitor guidelines for inpatient rooms apply. Pre-op nurse will coordinate an appropriate time for 1 support person to accompany patient in pre-op.  This support person may not rotate.    Please refer to the [name] website for the visitor guidelines for Inpatients (after your surgery is over and you are in a regular room).    Your procedure is scheduled on: 09/01/22   Report to [facility] Main Entrance    Report to admitting at 5:15 AM   Call this number if you have problems the morning of surgery [phone]   Follow a clear liquid diet the day before surgery.   After Midnight you may have the following liquids until 4:30 AM DAY OF SURGERY  Water Non-Citrus Juices (without pulp, NO RED-Apple, White grape, White cranberry) Black Coffee (NO MILK/CREAM OR CREAMERS, sugar ok)  Clear Tea (NO MILK/CREAM OR CREAMERS, sugar ok) regular and decaf                             Plain Jell-O (NO RED)                                           Fruit ices (not with fruit pulp, NO RED)                                     Popsicles (NO RED)                                                               Sports drinks like Gatorade (NO RED)              Drink G2 drinks AT 10:00 PM the night before surgery.        The day of surgery:  Drink ONE (1) Pre-Surgery G2 at 4:30 AM the morning of surgery. Drink in one sitting. Do not sip.  This drink was given to you during your hospital  pre-op appointment  visit. Nothing else to drink after completing the  Pre-Surgery G2.          If you have questions, please contact your surgeon's office.   FOLLOW BOWEL PREP AND ANY ADDITIONAL PRE OP INSTRUCTIONS YOU RECEIVED FROM YOUR SURGEON'S OFFICE!!!     Oral Hygiene is also important to reduce your risk of infection.                                    Remember -  BRUSH YOUR TEETH THE MORNING OF SURGERY WITH YOUR REGULAR TOOTHPASTE  DENTURES WILL BE REMOVED PRIOR TO SURGERY PLEASE DO NOT APPLY "Poly grip" OR ADHESIVES!!!   Take these medicines the morning of surgery with A SIP OF WATER: Omeprazole, Rosuvastatin, Tamsulosin   DO NOT TAKE ANY ORAL DIABETIC MEDICATIONS DAY OF YOUR SURGERY  How to Manage Your Diabetes Before and After Surgery  Why is it important to control my blood sugar before and after surgery? Improving blood sugar levels before and after surgery helps healing and can limit problems. A way of improving blood sugar control is eating a healthy diet by:  Eating less sugar and carbohydrates  Increasing activity/exercise  Talking with your doctor about reaching your blood sugar goals High blood sugars (greater than 180 mg/dL) can raise your risk of infections and slow your recovery, so you will need to focus on controlling your diabetes during the weeks before surgery. Make sure that the doctor who takes care of your diabetes knows about your planned surgery including the date and location.  How do I manage my blood sugar before surgery? Check your blood sugar at least 4 times a day, starting 2 days before surgery, to make sure that the level is not too high or low. Check your blood sugar the morning of your surgery when you wake up and every 2 hours until you get to the Short Stay unit. If your blood sugar is less than 70 mg/dL, you will need to treat for low blood sugar: Do not take insulin. Treat a low blood sugar (less than 70 mg/dL) with  cup of clear juice (cranberry or  apple), 4 glucose tablets, OR glucose gel. Recheck blood sugar in 15 minutes after treatment (to make sure it is greater than 70 mg/dL). If your blood sugar is not greater than 70 mg/dL on recheck, call [phone] for further instructions. Report your blood sugar to the short stay nurse when you get to Short Stay.  If you are admitted to the hospital after surgery: Your blood sugar will be checked by the staff and you will probably be given insulin after surgery (instead of oral diabetes medicines) to make sure you have good blood sugar levels. The goal for blood sugar control after surgery is 80-180 mg/dL.   WHAT DO I DO ABOUT MY DIABETES MEDICATION?  Do not take oral diabetes medicines (pills) the morning of surgery.  Hold Trulicity for 7 days prior to surgery. Last dose 08/20/22. Do not take 08/27/22. May resume after surgery.  Hold Jardiance 72 hours prior to surgery. Last dose 08/28/22. May resume after surgery.  THE DAY BEFORE SURGERY, take Metformin as prescribed.      THE MORNING OF SURGERY, do not take Metformin.  DO NOT TAKE THE FOLLOWING 7 DAYS PRIOR TO SURGERY: Ozempic, Wegovy, Rybelsus (Semaglutide), Byetta (exenatide), Bydureon (exenatide ER), Victoza, Saxenda (liraglutide), or Trulicity (dulaglutide) Mounjaro (Tirzepatide) Adlyxin (Lixisenatide), Polyethylene Glycol Loxenatide.  Reviewed and Endorsed by [facility] Patient Education Committee, August 2015                              You may not have any metal on your body including jewelry, and body piercing             Do not wear lotions, powders, cologne, or deodorant  Do not shave  48 hours prior to surgery.  Men may shave face and neck.   Do not bring valuables to the hospital. [place].   Contacts, glasses, dentures or bridgework may not be worn into surgery.   Bring small overnight bag day of surgery.   DO NOT [place]. PHARMACY WILL DISPENSE MEDICATIONS LISTED ON YOUR MEDICATION LIST TO YOU DURING YOUR ADMISSION [place]!              Please read over the following fact sheets you were given: IF [name] [name] [phone] [name]    If you received a COVID test during your pre-op visit  it is requested that you wear a mask when out in public, stay away from anyone that may not be feeling well and notify your surgeon if you develop symptoms. If you test positive for Covid or have been in contact with anyone that has tested positive in the last 10 days please notify you surgeon.    [facility] - Preparing for Surgery Before surgery, you can play an important role.  Because skin is not sterile, your skin needs to be as free of germs as possible.  You can reduce the number of germs on your skin by washing with CHG (chlorahexidine gluconate) soap before surgery.  CHG is an antiseptic cleaner which kills germs and bonds with the skin to continue killing germs even after washing. Please DO NOT use if you have an allergy to CHG or antibacterial soaps.  If your skin becomes reddened/irritated stop using the CHG and inform your nurse when you arrive at Short Stay. Do not shave (including legs and underarms) for at least 48 hours prior to the first CHG shower.  You may shave your face/neck.  Please follow these instructions carefully:  1.  Shower with CHG Soap the night before surgery and the  morning of surgery.  2.  If you choose to wash your hair, wash your hair first as usual with your normal  shampoo.  3.  After you shampoo, rinse your hair and body thoroughly to remove the shampoo.                             4.  Use CHG as you would any other liquid soap.  You can apply chg directly to the skin and wash.  Gently with a scrungie or clean washcloth.  5.  Apply the CHG Soap to your body ONLY FROM THE NECK DOWN.   Do   not use on face/ open                            Wound or open sores. Avoid contact with eyes, ears mouth and   genitals (private parts).                       Wash face,  Genitals (private parts) with your normal soap.             6.  Wash thoroughly, paying special attention to the area where your    surgery  will be performed.  7.  Thoroughly rinse your body with warm water from the neck down.  8.  DO NOT shower/wash with your normal soap after  using and rinsing off the CHG Soap.                9.  Pat yourself dry with a clean towel.            10.  Wear clean pajamas.            11.  Place clean sheets on your bed the night of your first shower and do not  sleep with pets. Day of Surgery : Do not apply any lotions/deodorants the morning of surgery.  Please wear clean clothes to the hospital/surgery center.  FAILURE TO FOLLOW THESE INSTRUCTIONS MAY RESULT IN THE CANCELLATION OF YOUR SURGERY  PATIENT SIGNATURE_________________________________  NURSE SIGNATURE__________________________________  ________________________________________________________________________  [name]  An incentive spirometer is a tool that can help keep your lungs clear and active. This tool measures how well you are filling your lungs with each breath. Taking long deep breaths may help reverse or decrease the chance of developing breathing (pulmonary) problems (especially infection) following: A long period of time when you are unable to move or be active. BEFORE THE PROCEDURE  If the spirometer includes an indicator to show your best effort, your nurse or respiratory therapist will set it to a desired goal. If possible, sit up straight or lean slightly forward. Try not to slouch. Hold the incentive spirometer in an upright position. INSTRUCTIONS FOR USE  Sit on the edge of your bed if possible, or sit up as far as you can in bed or on a chair. Hold the incentive spirometer in an upright position. Breathe out normally. Place the  mouthpiece in your mouth and seal your lips tightly around it. Breathe in slowly and as deeply as possible, raising the piston or the ball toward the top of the column. Hold your breath for 3-5 seconds or for as long as possible. Allow the piston or ball to fall to the bottom of the column. Remove the mouthpiece from your mouth and breathe out normally. Rest for a few seconds and repeat Steps 1 through 7 at least 10 times every 1-2 hours when you are awake. Take your time and take a few normal breaths between deep breaths. The spirometer may include an indicator to show your best effort. Use the indicator as a goal to work toward during each repetition. After each set of 10 deep breaths, practice coughing to be sure your lungs are clear. If you have an incision (the cut made at the time of surgery), support your incision when coughing by placing a pillow or rolled up towels firmly against it. Once you are able to get out of bed, walk around indoors and cough well. You may stop using the incentive spirometer when instructed by your caregiver.  RISKS AND COMPLICATIONS Take your time so you do not get dizzy or light-headed. If you are in pain, you may need to take or ask for pain medication before doing incentive spirometry. It is harder to take a deep breath if you are having pain. AFTER USE Rest and breathe slowly and easily. It can be helpful to keep track of a log of your progress. Your caregiver can provide you with a simple table to help with this. If you are using the spirometer at home, follow these instructions: [place] IF:  You are having difficultly using the spirometer. You have trouble using the spirometer as often as instructed. Your pain medication is not giving enough relief while using the spirometer. You develop  fever of 100.5 F (38.1 C) or higher. SEEK IMMEDIATE MEDICAL CARE IF:  You cough up bloody sputum that had not been present before. You develop fever of 102 F  (38.9 C) or greater. You develop worsening pain at or near the incision site. MAKE SURE YOU:  Understand these instructions. Will watch your condition. Will get help right away if you are not doing well or get worse. Document Released: 10/23/2006 Document Revised: 09/04/2011 Document Reviewed: 12/24/2006 ExitCare Patient Information 2014 ExitCare, [place].   ________________________________________________________________________ WHAT IS A BLOOD TRANSFUSION? Blood Transfusion Information  A transfusion is the replacement of blood or some of its parts. Blood is made up of multiple cells which provide different functions. Red blood cells carry oxygen and are used for blood loss replacement. White blood cells fight against infection. Platelets control bleeding. Plasma helps clot blood. Other blood products are available for specialized needs, such as hemophilia or other clotting disorders. BEFORE THE TRANSFUSION  Who gives blood for transfusions?  Healthy volunteers who are fully evaluated to make sure their blood is safe. This is blood bank blood. Transfusion therapy is the safest it has ever been in the practice of medicine. Before blood is taken from a donor, a complete history is taken to make sure that person has no history of diseases nor engages in risky social behavior (examples are intravenous drug use or sexual activity with multiple partners). The donor's travel history is screened to minimize risk of transmitting infections, such as malaria. The donated blood is tested for signs of infectious diseases, such as HIV and hepatitis. The blood is then tested to be sure it is compatible with you in order to minimize the chance of a transfusion reaction. If you or a relative donates blood, this is often done in anticipation of surgery and is not appropriate for emergency situations. It takes many days to process the donated blood. RISKS AND COMPLICATIONS Although transfusion therapy is very  safe and saves many lives, the main dangers of transfusion include:  Getting an infectious disease. Developing a transfusion reaction. This is an allergic reaction to something in the blood you were given. Every precaution is taken to prevent this. The decision to have a blood transfusion has been considered carefully by your caregiver before blood is given. Blood is not given unless the benefits outweigh the risks. AFTER THE TRANSFUSION Right after receiving a blood transfusion, you will usually feel much better and more energetic. This is especially true if your red blood cells have gotten low (anemic). The transfusion raises the level of the red blood cells which carry oxygen, and this usually causes an energy increase. The nurse administering the transfusion will monitor you carefully for complications. HOME CARE INSTRUCTIONS  No special instructions are needed after a transfusion. You may find your energy is better. Speak with your caregiver about any limitations on activity for underlying diseases you may have. SEEK MEDICAL CARE IF:  Your condition is not improving after your transfusion. You develop redness or irritation at the intravenous (IV) site. SEEK IMMEDIATE MEDICAL CARE IF:  Any of the following symptoms occur over the next 12 hours: Shaking chills. You have a temperature by mouth above 102 F (38.9 C), not controlled by medicine. Chest, back, or muscle pain. People around you feel you are not acting correctly or are confused. Shortness of breath or difficulty breathing. Dizziness and fainting. You get a rash or develop hives. You have a decrease in urine output. Your urine turns a  dark color or changes to pink, red, or brown. Any of the following symptoms occur over the next 10 days: You have a temperature by mouth above 102 F (38.9 C), not controlled by medicine. Shortness of breath. Weakness after normal activity. The white part of the eye turns yellow (jaundice). You  have a decrease in the amount of urine or are urinating less often. Your urine turns a dark color or changes to pink, red, or brown. Document Released: 06/09/2000 Document Revised: 09/04/2011 Document Reviewed: 01/27/2008 [facility] Patient Information 2014 [place], [place].  _______________________________________________________________________

## 2022-08-22 NOTE — Progress Notes (Addendum)
WOC RN saw patient for marking.  COVID Vaccine Completed: yes  Date of COVID positive in last 90 days: no  PCP - Jannifer Rodney, FNP Cardiologist - n/a  Chest x-ray - CT 06/18/22 Epic EKG - 06/21/22 Epic Stress Test - n/a ECHO - n/a Cardiac Cath - n/a Pacemaker/ICD device last checked: n/a Spinal Cord Stimulator: yes, remote, pt instructed to bring DOS  Bowel Prep - pt has instructions and supplies  Sleep Study - n/a CPAP -   Fasting Blood Sugar - 120-140 Checks Blood Sugar  has free style   Last dose of GLP1 agonist-  Trulicity Sundays, last dose 08/20/22 GLP1 instructions:  hold 08/27/22  Last dose of SGLT-2 inhibitors-  N/A SGLT-2 instructions: N/A   Blood Thinner Instructions: n/a Aspirin Instructions: Last Dose:  Activity level: Can go up a flight of stairs and perform activities of daily living without stopping and without symptoms of chest pain or shortness of breath.  Anesthesia review: BS 261, DM2, rectal cancer  Patient denies shortness of breath, fever, cough and chest pain at PAT appointment  Patient verbalized understanding of instructions that were given to them at the PAT appointment. Patient was also instructed that they will need to review over the PAT instructions again at home before surgery.

## 2022-08-23 ENCOUNTER — Encounter (HOSPITAL_COMMUNITY): Payer: Self-pay

## 2022-08-23 ENCOUNTER — Encounter (HOSPITAL_COMMUNITY)
Admission: RE | Admit: 2022-08-23 | Discharge: 2022-08-23 | Disposition: A | Discharge status: Home / Self Care | Payer: Commercial Managed Care - PPO | Source: Ambulatory Visit | Attending: Surgery | Admitting: Surgery

## 2022-08-23 VITALS — BP 125/83 | HR 90 | Temp 98.0°F | Resp 14 | Ht 72.0 in | Wt 248.9 lb

## 2022-08-23 DIAGNOSIS — Z01818 Encounter for other preprocedural examination: Secondary | ICD-10-CM | POA: Insufficient documentation

## 2022-08-23 DIAGNOSIS — E119 Type 2 diabetes mellitus without complications: Secondary | ICD-10-CM

## 2022-08-23 LAB — CBC
HCT: 43.6 % (ref 39.0–52.0)
Hemoglobin: 14.5 g/dL (ref 13.0–17.0)
MCH: 31.2 pg (ref 26.0–34.0)
MCHC: 33.3 g/dL (ref 30.0–36.0)
MCV: 93.8 fL (ref 80.0–100.0)
Platelets: 147 10*3/uL — ABNORMAL LOW (ref 150–400)
RBC: 4.65 MIL/uL (ref 4.22–5.81)
RDW: 16.4 % — ABNORMAL HIGH (ref 11.5–15.5)
WBC: 4 10*3/uL (ref 4.0–10.5)
nRBC: 0 % (ref 0.0–0.2)

## 2022-08-23 LAB — BASIC METABOLIC PANEL
Anion gap: 7 (ref 5–15)
BUN: 16 mg/dL (ref 8–23)
CO2: 21 mmol/L — ABNORMAL LOW (ref 22–32)
Calcium: 8.8 mg/dL — ABNORMAL LOW (ref 8.9–10.3)
Chloride: 106 mmol/L (ref 98–111)
Creatinine, Ser: 1.04 mg/dL (ref 0.61–1.24)
GFR, Estimated: >60 mL/min (ref >60)
Glucose, Bld: 228 mg/dL — ABNORMAL HIGH (ref 70–99)
Potassium: 4.2 mmol/L (ref 3.5–5.1)
Sodium: 134 mmol/L — ABNORMAL LOW (ref 135–145)

## 2022-08-23 LAB — GLUCOSE, CAPILLARY: Glucose-Capillary: 261 mg/dL — ABNORMAL HIGH (ref 70–99)

## 2022-08-23 NOTE — Consult Note (Addendum)
[place] Nurse requested for preoperative stoma site marking  Discussed surgical procedure and stoma creation with patient and wife.  Explained role of the [place] nurse team.  Provided the patient with educational booklet and provided samples of pouching options.  Answered patient and wife's questions.  Examined patient sitting, and standing in order to place the marking in the patient's visual field, away from any creases or abdominal contour issues and within the rectus muscle.  Attempted to mark below the patient's belt line, but this was not possible, since a significant crease occurs lower on the abd when the patient leans forward and this should be avoided if possible.  Marked for colostomy in the LLQ  _7___ cm to the left of the umbilicus and [id number] below the umbilicus.  Marked for ileostomy in the RLQ  _7___cm to the right of the umbilicus and  [id number] cm above the umbilicus.  Patient's abdomen cleansed with CHG wipes at site markings, allowed to air dry prior to marking.Covered mark with thin film transparent dressing to preserve mark until date of surgery. Provided patient with marking pen and instructed him to re-color in the mark if it begins to fade prior to surgery.   [place] Nurse team will follow up with patient after surgery for continued ostomy care and teaching.  Thank-you,  Julien Girt MSN, [place], [place], [place], [place]

## 2022-08-24 LAB — HEMOGLOBIN A1C
Hgb A1c MFr Bld: 6.2 % — ABNORMAL HIGH (ref 4.8–5.6)
Mean Plasma Glucose: 131 mg/dL

## 2022-08-31 NOTE — Anesthesia Preprocedure Evaluation (Signed)
Anesthesia Evaluation   Patient awake    Reviewed: Allergy & Precautions, Patient's Chart, lab work & pertinent test results  History of Anesthesia Complications (+) PONVNegative for: history of anesthetic complications  Airway Mallampati: I  TM Distance: >3 FB Neck ROM: Full    Dental  (+) Teeth Intact, Dental Advisory Given   Pulmonary asthma , former smoker   Pulmonary exam normal        Cardiovascular negative cardio ROS Normal cardiovascular exam     Neuro/Psych  Headaches negative neurological ROS     GI/Hepatic ,GERD  Medicated,,Liver metastasis Metastatic colon cancer   Endo/Other  diabetes, Type 2, Oral Hypoglycemic Agents    Renal/GU negative Renal ROS  negative genitourinary   Musculoskeletal  (+) Arthritis ,    Abdominal   Peds  Hematology negative hematology ROS (+)   Anesthesia Other Findings   Reproductive/Obstetrics                              Anesthesia Physical Anesthesia Plan  ASA: 3  Anesthesia Plan: General   Post-op Pain Management: Toradol IV (intra-op)* and Dilaudid IV   Induction: Intravenous  PONV Risk Score and Plan: 3 and Ondansetron, Dexamethasone, Treatment may vary due to age or medical condition and Midazolam  Airway Management Planned: Oral ETT  Additional Equipment: None  Intra-op Plan:   Post-operative Plan: Extubation in OR  Informed Consent:   Plan Discussed with: Anesthesiologist and CRNA  Anesthesia Plan Comments: (Large bore PIV x2)         Anesthesia Quick Evaluation

## 2022-09-01 ENCOUNTER — Inpatient Hospital Stay (HOSPITAL_COMMUNITY): Payer: Commercial Managed Care - PPO | Admitting: Anesthesiology

## 2022-09-01 ENCOUNTER — Encounter (HOSPITAL_COMMUNITY): Payer: Self-pay | Admitting: Surgery

## 2022-09-01 ENCOUNTER — Inpatient Hospital Stay (HOSPITAL_COMMUNITY): Payer: Commercial Managed Care - PPO | Admitting: Physician Assistant

## 2022-09-01 ENCOUNTER — Other Ambulatory Visit (HOSPITAL_COMMUNITY): Payer: Self-pay

## 2022-09-01 ENCOUNTER — Inpatient Hospital Stay (HOSPITAL_COMMUNITY)
Admission: RE | Admit: 2022-09-01 | Discharge: 2022-09-07 | DRG: 330 | Disposition: A | Discharge status: Home Health | Payer: Commercial Managed Care - PPO | Attending: Surgery | Admitting: Surgery

## 2022-09-01 ENCOUNTER — Other Ambulatory Visit: Payer: Self-pay

## 2022-09-01 ENCOUNTER — Encounter (HOSPITAL_COMMUNITY)
Admission: RE | Disposition: A | Discharge status: Home Health | Payer: Self-pay | Source: Home / Self Care | Attending: Surgery

## 2022-09-01 DIAGNOSIS — Z7984 Long term (current) use of oral hypoglycemic drugs: Secondary | ICD-10-CM

## 2022-09-01 DIAGNOSIS — Z87891 Personal history of nicotine dependence: Secondary | ICD-10-CM

## 2022-09-01 DIAGNOSIS — N401 Enlarged prostate with lower urinary tract symptoms: Secondary | ICD-10-CM | POA: Diagnosis present

## 2022-09-01 DIAGNOSIS — Z833 Family history of diabetes mellitus: Secondary | ICD-10-CM

## 2022-09-01 DIAGNOSIS — Z86010 Personal history of colonic polyps: Secondary | ICD-10-CM | POA: Diagnosis not present

## 2022-09-01 DIAGNOSIS — C20 Malignant neoplasm of rectum: Secondary | ICD-10-CM

## 2022-09-01 DIAGNOSIS — E871 Hypo-osmolality and hyponatremia: Secondary | ICD-10-CM | POA: Diagnosis present

## 2022-09-01 DIAGNOSIS — Z800 Family history of malignant neoplasm of digestive organs: Secondary | ICD-10-CM

## 2022-09-01 DIAGNOSIS — E785 Hyperlipidemia, unspecified: Secondary | ICD-10-CM | POA: Diagnosis present

## 2022-09-01 DIAGNOSIS — J302 Other seasonal allergic rhinitis: Secondary | ICD-10-CM | POA: Diagnosis present

## 2022-09-01 DIAGNOSIS — Z9049 Acquired absence of other specified parts of digestive tract: Secondary | ICD-10-CM

## 2022-09-01 DIAGNOSIS — E119 Type 2 diabetes mellitus without complications: Secondary | ICD-10-CM | POA: Diagnosis present

## 2022-09-01 DIAGNOSIS — G8929 Other chronic pain: Secondary | ICD-10-CM | POA: Diagnosis present

## 2022-09-01 DIAGNOSIS — J45909 Unspecified asthma, uncomplicated: Secondary | ICD-10-CM | POA: Diagnosis not present

## 2022-09-01 DIAGNOSIS — I10 Essential (primary) hypertension: Secondary | ICD-10-CM | POA: Diagnosis present

## 2022-09-01 DIAGNOSIS — E876 Hypokalemia: Secondary | ICD-10-CM | POA: Diagnosis present

## 2022-09-01 DIAGNOSIS — K660 Peritoneal adhesions (postprocedural) (postinfection): Secondary | ICD-10-CM | POA: Diagnosis present

## 2022-09-01 DIAGNOSIS — Z6833 Body mass index (BMI) 33.0-33.9, adult: Secondary | ICD-10-CM | POA: Diagnosis not present

## 2022-09-01 DIAGNOSIS — Z9682 Presence of neurostimulator: Secondary | ICD-10-CM

## 2022-09-01 DIAGNOSIS — C787 Secondary malignant neoplasm of liver and intrahepatic bile duct: Secondary | ICD-10-CM | POA: Diagnosis present

## 2022-09-01 DIAGNOSIS — K219 Gastro-esophageal reflux disease without esophagitis: Secondary | ICD-10-CM | POA: Diagnosis present

## 2022-09-01 DIAGNOSIS — E669 Obesity, unspecified: Secondary | ICD-10-CM | POA: Diagnosis present

## 2022-09-01 DIAGNOSIS — Z83719 Family history of colon polyps, unspecified: Secondary | ICD-10-CM | POA: Diagnosis not present

## 2022-09-01 DIAGNOSIS — R3914 Feeling of incomplete bladder emptying: Secondary | ICD-10-CM | POA: Diagnosis present

## 2022-09-01 DIAGNOSIS — C779 Secondary and unspecified malignant neoplasm of lymph node, unspecified: Secondary | ICD-10-CM | POA: Diagnosis present

## 2022-09-01 DIAGNOSIS — Z981 Arthrodesis status: Secondary | ICD-10-CM

## 2022-09-01 DIAGNOSIS — Z932 Ileostomy status: Secondary | ICD-10-CM

## 2022-09-01 HISTORY — PX: LAPAROSCOPIC LYSIS OF ADHESIONS: SHX5905

## 2022-09-01 HISTORY — PX: DIVERTING ILEOSTOMY: SHX5799

## 2022-09-01 HISTORY — PX: XI ROBOTIC ASSISTED LOWER ANTERIOR RESECTION: SHX6558

## 2022-09-01 HISTORY — PX: FLEXIBLE SIGMOIDOSCOPY: SHX5431

## 2022-09-01 LAB — GLUCOSE, CAPILLARY
Glucose-Capillary: 225 mg/dL — ABNORMAL HIGH (ref 70–99)
Glucose-Capillary: 295 mg/dL — ABNORMAL HIGH (ref 70–99)
Glucose-Capillary: 263 mg/dL — ABNORMAL HIGH (ref 70–99)
Glucose-Capillary: 251 mg/dL — ABNORMAL HIGH (ref 70–99)

## 2022-09-01 LAB — TYPE AND SCREEN
ABO/RH(D): O NEG
Antibody Screen: NEGATIVE

## 2022-09-01 SURGERY — RESECTION, RECTUM, LOW ANTERIOR, ROBOT-ASSISTED
Anesthesia: General | Site: Abdomen

## 2022-09-01 MED ORDER — ALBUTEROL SULFATE (2.5 MG/3ML) 0.083% IN NEBU: 2.5000 mg | INHALATION_SOLUTION | Freq: Four times a day (QID) | RESPIRATORY_TRACT | Status: DC | PRN

## 2022-09-01 MED ORDER — KETAMINE HCL 10 MG/ML IJ SOLN
INTRAMUSCULAR | Status: AC
  Filled 2022-09-01: qty 1

## 2022-09-01 MED ORDER — PROPOFOL 10 MG/ML IV BOLUS
INTRAVENOUS | Status: AC
  Filled 2022-09-01: qty 20

## 2022-09-01 MED ORDER — ORAL CARE MOUTH RINSE: 15.0000 mL | Freq: Once | OROMUCOSAL | Status: AC

## 2022-09-01 MED ORDER — PROPOFOL 10 MG/ML IV BOLUS
INTRAVENOUS | Status: DC | PRN
  Administered 2022-09-01: 200 mg via INTRAVENOUS

## 2022-09-01 MED ORDER — FENTANYL CITRATE (PF) 100 MCG/2ML IJ SOLN
INTRAMUSCULAR | Status: DC | PRN
  Administered 2022-09-01 (×4): 50 ug via INTRAVENOUS
  Administered 2022-09-01: 100 ug via INTRAVENOUS
  Administered 2022-09-01: 50 ug via INTRAVENOUS
  Administered 2022-09-01: 100 ug via INTRAVENOUS
  Administered 2022-09-01 (×2): 50 ug via INTRAVENOUS

## 2022-09-01 MED ORDER — LIDOCAINE 2% (20 MG/ML) 5 ML SYRINGE
INTRAMUSCULAR | Status: DC | PRN
  Administered 2022-09-01: 60 mg via INTRAVENOUS

## 2022-09-01 MED ORDER — HEPARIN SODIUM (PORCINE) 5000 UNIT/ML IJ SOLN
5000.0000 [IU] | Freq: Once | INTRAMUSCULAR | Status: AC
  Administered 2022-09-01: 5000 [IU] via SUBCUTANEOUS
  Filled 2022-09-01: qty 1

## 2022-09-01 MED ORDER — DEXAMETHASONE SODIUM PHOSPHATE 4 MG/ML IJ SOLN
INTRAMUSCULAR | Status: DC | PRN
  Administered 2022-09-01: 5 mg via INTRAVENOUS

## 2022-09-01 MED ORDER — HEPARIN SODIUM (PORCINE) 5000 UNIT/ML IJ SOLN
5000.0000 [IU] | Freq: Three times a day (TID) | INTRAMUSCULAR | Status: DC
  Administered 2022-09-01 – 2022-09-07 (×18): 5000 [IU] via SUBCUTANEOUS
  Filled 2022-09-01 (×18): qty 1

## 2022-09-01 MED ORDER — LIDOCAINE HCL (PF) 2 % IJ SOLN
INTRAMUSCULAR | Status: DC | PRN
  Administered 2022-09-01: 1.5 mg/kg/h via INTRADERMAL

## 2022-09-01 MED ORDER — HYDROMORPHONE HCL 2 MG/ML IJ SOLN
INTRAMUSCULAR | Status: AC
  Filled 2022-09-01: qty 1

## 2022-09-01 MED ORDER — STERILE WATER FOR IRRIGATION IR SOLN
Status: DC | PRN
  Administered 2022-09-01: 500 mL

## 2022-09-01 MED ORDER — ALVIMOPAN 12 MG PO CAPS
12.0000 mg | ORAL_CAPSULE | Freq: Two times a day (BID) | ORAL | Status: DC
  Administered 2022-09-02 (×2): 12 mg via ORAL
  Filled 2022-09-01 (×3): qty 1

## 2022-09-01 MED ORDER — LACTATED RINGERS IV SOLN: INTRAVENOUS | Status: DC | PRN

## 2022-09-01 MED ORDER — IBUPROFEN 400 MG PO TABS
600.0000 mg | ORAL_TABLET | Freq: Four times a day (QID) | ORAL | Status: DC | PRN
  Administered 2022-09-03 – 2022-09-04 (×2): 600 mg via ORAL
  Filled 2022-09-01 (×2): qty 1

## 2022-09-01 MED ORDER — HYDROMORPHONE HCL 1 MG/ML IJ SOLN
INTRAMUSCULAR | Status: DC | PRN
  Administered 2022-09-01 (×2): 1 mg via INTRAVENOUS

## 2022-09-01 MED ORDER — DIPHENHYDRAMINE HCL 12.5 MG/5ML PO ELIX: 12.5000 mg | ORAL_SOLUTION | Freq: Four times a day (QID) | ORAL | Status: DC | PRN

## 2022-09-01 MED ORDER — FENTANYL CITRATE PF 50 MCG/ML IJ SOSY
PREFILLED_SYRINGE | INTRAMUSCULAR | Status: AC
  Filled 2022-09-01: qty 1

## 2022-09-01 MED ORDER — PANTOPRAZOLE SODIUM 40 MG PO TBEC
40.0000 mg | DELAYED_RELEASE_TABLET | Freq: Every day | ORAL | Status: DC
  Administered 2022-09-02 – 2022-09-07 (×6): 40 mg via ORAL
  Filled 2022-09-01 (×6): qty 1

## 2022-09-01 MED ORDER — HYDROMORPHONE HCL 1 MG/ML IJ SOLN
0.5000 mg | INTRAMUSCULAR | Status: DC | PRN
  Administered 2022-09-01 – 2022-09-03 (×8): 0.5 mg via INTRAVENOUS
  Filled 2022-09-01 (×8): qty 0.5

## 2022-09-01 MED ORDER — MEPERIDINE HCL 50 MG/ML IJ SOLN: 6.2500 mg | INTRAMUSCULAR | Status: DC | PRN

## 2022-09-01 MED ORDER — BUPIVACAINE LIPOSOME 1.3 % IJ SUSP: 20.0000 mL | Freq: Once | INTRAMUSCULAR | Status: DC

## 2022-09-01 MED ORDER — CHLORHEXIDINE GLUCONATE CLOTH 2 % EX PADS: 6.0000 | MEDICATED_PAD | Freq: Once | CUTANEOUS | Status: DC

## 2022-09-01 MED ORDER — FENTANYL CITRATE (PF) 250 MCG/5ML IJ SOLN
INTRAMUSCULAR | Status: AC
  Filled 2022-09-01: qty 5

## 2022-09-01 MED ORDER — OXYCODONE HCL 5 MG/5ML PO SOLN: 5.0000 mg | Freq: Once | ORAL | Status: DC | PRN

## 2022-09-01 MED ORDER — METRONIDAZOLE 500 MG PO TABS: 1000.0000 mg | ORAL_TABLET | ORAL | Status: DC

## 2022-09-01 MED ORDER — ONDANSETRON HCL 4 MG/2ML IJ SOLN: 4.0000 mg | Freq: Once | INTRAMUSCULAR | Status: DC | PRN

## 2022-09-01 MED ORDER — MIDAZOLAM HCL 5 MG/5ML IJ SOLN
INTRAMUSCULAR | Status: DC | PRN
  Administered 2022-09-01: 2 mg via INTRAVENOUS

## 2022-09-01 MED ORDER — SPY AGENT GREEN - (INDOCYANINE FOR INJECTION)
INTRAMUSCULAR | Status: DC | PRN
  Administered 2022-09-01: 2 mL via INTRAVENOUS

## 2022-09-01 MED ORDER — BUPIVACAINE-EPINEPHRINE (PF) 0.25% -1:200000 IJ SOLN
INTRAMUSCULAR | Status: AC
  Filled 2022-09-01: qty 30

## 2022-09-01 MED ORDER — TRAMADOL HCL 50 MG PO TABS
50.0000 mg | ORAL_TABLET | Freq: Four times a day (QID) | ORAL | Status: DC | PRN
  Administered 2022-09-02 – 2022-09-05 (×7): 50 mg via ORAL
  Filled 2022-09-01 (×8): qty 1

## 2022-09-01 MED ORDER — ACETAMINOPHEN 500 MG PO TABS
1000.0000 mg | ORAL_TABLET | ORAL | Status: AC
  Administered 2022-09-01: 1000 mg via ORAL
  Filled 2022-09-01: qty 2

## 2022-09-01 MED ORDER — BISACODYL 5 MG PO TBEC: 20.0000 mg | DELAYED_RELEASE_TABLET | Freq: Once | ORAL | Status: DC

## 2022-09-01 MED ORDER — ACETAMINOPHEN 325 MG PO TABS: 325.0000 mg | ORAL_TABLET | ORAL | Status: DC | PRN

## 2022-09-01 MED ORDER — ROCURONIUM BROMIDE 10 MG/ML (PF) SYRINGE
PREFILLED_SYRINGE | INTRAVENOUS | Status: AC
  Filled 2022-09-01: qty 10

## 2022-09-01 MED ORDER — BUPIVACAINE-EPINEPHRINE (PF) 0.25% -1:200000 IJ SOLN
INTRAMUSCULAR | Status: DC | PRN
  Administered 2022-09-01: 50 mL

## 2022-09-01 MED ORDER — ROCURONIUM BROMIDE 10 MG/ML (PF) SYRINGE
PREFILLED_SYRINGE | INTRAVENOUS | Status: DC | PRN
  Administered 2022-09-01: 100 mg via INTRAVENOUS
  Administered 2022-09-01: 40 mg via INTRAVENOUS

## 2022-09-01 MED ORDER — OXYCODONE HCL 5 MG PO TABS: 5.0000 mg | ORAL_TABLET | Freq: Once | ORAL | Status: DC | PRN

## 2022-09-01 MED ORDER — INSULIN ASPART 100 UNIT/ML IJ SOLN
0.0000 [IU] | Freq: Every day | INTRAMUSCULAR | Status: DC
  Administered 2022-09-01: 3 [IU] via SUBCUTANEOUS
  Administered 2022-09-03: 2 [IU] via SUBCUTANEOUS

## 2022-09-01 MED ORDER — NEOMYCIN SULFATE 500 MG PO TABS: 1000.0000 mg | ORAL_TABLET | ORAL | Status: DC

## 2022-09-01 MED ORDER — LACTATED RINGERS IV SOLN
INTRAVENOUS | Status: DC
  Administered 2022-09-01: 1000 mL via INTRAVENOUS

## 2022-09-01 MED ORDER — 0.9 % SODIUM CHLORIDE (POUR BTL) OPTIME
TOPICAL | Status: DC | PRN
  Administered 2022-09-01: 2000 mL

## 2022-09-01 MED ORDER — ENSURE PRE-SURGERY PO LIQD
296.0000 mL | Freq: Once | ORAL | Status: DC
  Filled 2022-09-01: qty 296

## 2022-09-01 MED ORDER — SIMETHICONE 80 MG PO CHEW
40.0000 mg | CHEWABLE_TABLET | Freq: Four times a day (QID) | ORAL | Status: DC | PRN
  Administered 2022-09-02: 40 mg via ORAL
  Filled 2022-09-01: qty 1

## 2022-09-01 MED ORDER — ONDANSETRON HCL 4 MG/2ML IJ SOLN
INTRAMUSCULAR | Status: DC | PRN
  Administered 2022-09-01: 4 mg via INTRAVENOUS

## 2022-09-01 MED ORDER — HYDRALAZINE HCL 20 MG/ML IJ SOLN: 10.0000 mg | INTRAMUSCULAR | Status: DC | PRN

## 2022-09-01 MED ORDER — TAMSULOSIN HCL 0.4 MG PO CAPS
0.8000 mg | ORAL_CAPSULE | Freq: Every day | ORAL | Status: DC
  Administered 2022-09-02 – 2022-09-07 (×6): 0.8 mg via ORAL
  Filled 2022-09-01 (×6): qty 2

## 2022-09-01 MED ORDER — ROSUVASTATIN CALCIUM 10 MG PO TABS
10.0000 mg | ORAL_TABLET | Freq: Every day | ORAL | Status: DC
  Administered 2022-09-01 – 2022-09-07 (×7): 10 mg via ORAL
  Filled 2022-09-01 (×7): qty 1

## 2022-09-01 MED ORDER — ACETAMINOPHEN 500 MG PO TABS: 1000.0000 mg | ORAL_TABLET | Freq: Four times a day (QID) | ORAL | Status: DC

## 2022-09-01 MED ORDER — LORAZEPAM 0.5 MG PO TABS
0.5000 mg | ORAL_TABLET | Freq: Three times a day (TID) | ORAL | Status: DC | PRN
  Administered 2022-09-01: 0.5 mg via ORAL
  Filled 2022-09-01 (×2): qty 1

## 2022-09-01 MED ORDER — POLYETHYLENE GLYCOL 3350 17 GM/SCOOP PO POWD: 1.0000 | Freq: Once | ORAL | Status: DC

## 2022-09-01 MED ORDER — ACETAMINOPHEN 500 MG PO TABS
1000.0000 mg | ORAL_TABLET | Freq: Four times a day (QID) | ORAL | Status: DC
  Administered 2022-09-01 – 2022-09-07 (×21): 1000 mg via ORAL
  Filled 2022-09-01 (×22): qty 2

## 2022-09-01 MED ORDER — ENSURE SURGERY PO LIQD: 237.0000 mL | Freq: Two times a day (BID) | ORAL | Status: DC

## 2022-09-01 MED ORDER — ONDANSETRON HCL 4 MG PO TABS: 4.0000 mg | ORAL_TABLET | Freq: Four times a day (QID) | ORAL | Status: DC | PRN

## 2022-09-01 MED ORDER — OXYCODONE HCL 5 MG PO TABS
5.0000 mg | ORAL_TABLET | Freq: Four times a day (QID) | ORAL | 0 refills | Status: AC | PRN
  Filled 2022-09-04 (×2): qty 20, 5d supply, fill #0
  Filled ????-??-??: fill #0

## 2022-09-01 MED ORDER — INSULIN ASPART 100 UNIT/ML IJ SOLN
0.0000 [IU] | Freq: Three times a day (TID) | INTRAMUSCULAR | Status: DC
  Administered 2022-09-01: 8 [IU] via SUBCUTANEOUS
  Administered 2022-09-02: 3 [IU] via SUBCUTANEOUS
  Administered 2022-09-02: 2 [IU] via SUBCUTANEOUS
  Administered 2022-09-02 – 2022-09-04 (×7): 3 [IU] via SUBCUTANEOUS
  Administered 2022-09-05: 2 [IU] via SUBCUTANEOUS
  Administered 2022-09-05 – 2022-09-06 (×2): 3 [IU] via SUBCUTANEOUS
  Administered 2022-09-06: 2 [IU] via SUBCUTANEOUS
  Administered 2022-09-06: 5 [IU] via SUBCUTANEOUS
  Administered 2022-09-07: 3 [IU] via SUBCUTANEOUS

## 2022-09-01 MED ORDER — SODIUM CHLORIDE 0.9 % IV SOLN
2.0000 g | INTRAVENOUS | Status: AC
  Administered 2022-09-01: 2 g via INTRAVENOUS
  Filled 2022-09-01: qty 2

## 2022-09-01 MED ORDER — LACTATED RINGERS IR SOLN
Status: DC | PRN
  Administered 2022-09-01: 1000 mL

## 2022-09-01 MED ORDER — SUGAMMADEX SODIUM 200 MG/2ML IV SOLN
INTRAVENOUS | Status: DC | PRN
  Administered 2022-09-01: 200 mg via INTRAVENOUS

## 2022-09-01 MED ORDER — DIPHENHYDRAMINE HCL 50 MG/ML IJ SOLN: 12.5000 mg | Freq: Four times a day (QID) | INTRAMUSCULAR | Status: DC | PRN

## 2022-09-01 MED ORDER — DEXMEDETOMIDINE HCL IN NACL 80 MCG/20ML IV SOLN
INTRAVENOUS | Status: AC
  Filled 2022-09-01: qty 20

## 2022-09-01 MED ORDER — ENSURE PRE-SURGERY PO LIQD
592.0000 mL | Freq: Once | ORAL | Status: DC
  Filled 2022-09-01: qty 592

## 2022-09-01 MED ORDER — CHLORHEXIDINE GLUCONATE 0.12 % MT SOLN
15.0000 mL | Freq: Once | OROMUCOSAL | Status: AC
  Administered 2022-09-01: 15 mL via OROMUCOSAL

## 2022-09-01 MED ORDER — FENTANYL CITRATE (PF) 100 MCG/2ML IJ SOLN
INTRAMUSCULAR | Status: AC
  Filled 2022-09-01: qty 2

## 2022-09-01 MED ORDER — EMPAGLIFLOZIN 25 MG PO TABS
25.0000 mg | ORAL_TABLET | Freq: Every day | ORAL | Status: DC
  Administered 2022-09-02 – 2022-09-07 (×6): 25 mg via ORAL
  Filled 2022-09-01 (×6): qty 1

## 2022-09-01 MED ORDER — LORATADINE 10 MG PO TABS
10.0000 mg | ORAL_TABLET | Freq: Every evening | ORAL | Status: DC
  Administered 2022-09-01 – 2022-09-06 (×6): 10 mg via ORAL
  Filled 2022-09-01 (×6): qty 1

## 2022-09-01 MED ORDER — ALUM & MAG HYDROXIDE-SIMETH 200-200-20 MG/5ML PO SUSP: 30.0000 mL | Freq: Four times a day (QID) | ORAL | Status: DC | PRN

## 2022-09-01 MED ORDER — MONTELUKAST SODIUM 10 MG PO TABS
10.0000 mg | ORAL_TABLET | Freq: Every day | ORAL | Status: DC
  Administered 2022-09-01 – 2022-09-06 (×6): 10 mg via ORAL
  Filled 2022-09-01 (×6): qty 1

## 2022-09-01 MED ORDER — KETAMINE HCL 10 MG/ML IJ SOLN
INTRAMUSCULAR | Status: DC | PRN
  Administered 2022-09-01: 30 mg via INTRAVENOUS

## 2022-09-01 MED ORDER — SCOPOLAMINE 1 MG/3DAYS TD PT72
1.0000 | MEDICATED_PATCH | TRANSDERMAL | Status: DC
  Administered 2022-09-01: 1.5 mg via TRANSDERMAL
  Filled 2022-09-01: qty 1

## 2022-09-01 MED ORDER — MIDAZOLAM HCL 2 MG/2ML IJ SOLN
INTRAMUSCULAR | Status: AC
  Filled 2022-09-01: qty 2

## 2022-09-01 MED ORDER — BUPIVACAINE LIPOSOME 1.3 % IJ SUSP
INTRAMUSCULAR | Status: AC
  Filled 2022-09-01: qty 20

## 2022-09-01 MED ORDER — FLUTICASONE PROPIONATE 50 MCG/ACT NA SUSP: 2.0000 | Freq: Every day | NASAL | Status: DC

## 2022-09-01 MED ORDER — LACTATED RINGERS IV SOLN: INTRAVENOUS | Status: DC

## 2022-09-01 MED ORDER — FENTANYL CITRATE PF 50 MCG/ML IJ SOSY
25.0000 ug | PREFILLED_SYRINGE | INTRAMUSCULAR | Status: DC | PRN
  Administered 2022-09-01: 50 ug via INTRAVENOUS

## 2022-09-01 MED ORDER — ACETAMINOPHEN 160 MG/5ML PO SOLN: 325.0000 mg | ORAL | Status: DC | PRN

## 2022-09-01 MED ORDER — ALVIMOPAN 12 MG PO CAPS
12.0000 mg | ORAL_CAPSULE | ORAL | Status: AC
  Administered 2022-09-01: 12 mg via ORAL
  Filled 2022-09-01: qty 1

## 2022-09-01 MED ORDER — ONDANSETRON HCL 4 MG/2ML IJ SOLN
4.0000 mg | Freq: Four times a day (QID) | INTRAMUSCULAR | Status: DC | PRN
  Administered 2022-09-04 – 2022-09-05 (×2): 4 mg via INTRAVENOUS
  Filled 2022-09-01 (×2): qty 2

## 2022-09-01 SURGICAL SUPPLY — 125 items
ADH SKN CLS APL DERMABOND .7 (GAUZE/BANDAGES/DRESSINGS) ×2
APL PRP STRL LF DISP 70% ISPRP (MISCELLANEOUS) ×2
APPLIER CLIP 5 13 M/L LIGAMAX5 (MISCELLANEOUS)
APPLIER CLIP ROT 10 11.4 M/L (STAPLE)
APR CLP MED LRG 11.4X10 (STAPLE)
APR CLP MED LRG 5 ANG JAW (MISCELLANEOUS)
BAG COUNTER SPONGE SURGICOUNT (BAG) IMPLANT
BAG SPNG CNTER NS LX DISP (BAG)
BLADE EXTENDED COATED 6.5IN (ELECTRODE) ×2 IMPLANT
CANNULA REDUC XI 12-8 STAPL (CANNULA) ×2
CANNULA REDUCER 12-8 DVNC XI (CANNULA) ×2 IMPLANT
CELLS DAT CNTRL 66122 CELL SVR (MISCELLANEOUS) IMPLANT
CHLORAPREP W/TINT 26 (MISCELLANEOUS) ×2 IMPLANT
CLIP APPLIE 5 13 M/L LIGAMAX5 (MISCELLANEOUS) IMPLANT
CLIP APPLIE ROT 10 11.4 M/L (STAPLE) IMPLANT
CLIP LIGATING HEM O LOK PURPLE (MISCELLANEOUS) IMPLANT
CLIP LIGATING HEMO O LOK GREEN (MISCELLANEOUS) IMPLANT
COVER SURGICAL LIGHT HANDLE (MISCELLANEOUS) ×4 IMPLANT
COVER TIP SHEARS 8 DVNC (MISCELLANEOUS) ×2 IMPLANT
COVER TIP SHEARS 8MM DA VINCI (MISCELLANEOUS) ×2
DEFOGGER SCOPE WARMER CLEARIFY (MISCELLANEOUS) ×2 IMPLANT
DERMABOND ADVANCED .7 DNX12 (GAUZE/BANDAGES/DRESSINGS) IMPLANT
DEVICE TROCAR PUNCTURE CLOSURE (ENDOMECHANICALS) IMPLANT
DRAIN CHANNEL 19F RND (DRAIN) ×2 IMPLANT
DRAPE ARM DVNC X/XI (DISPOSABLE) ×8 IMPLANT
DRAPE COLUMN DVNC XI (DISPOSABLE) ×2 IMPLANT
DRAPE DA VINCI XI ARM (DISPOSABLE) ×8
DRAPE DA VINCI XI COLUMN (DISPOSABLE) ×2
DRAPE SURG IRRIG POUCH 19X23 (DRAPES) ×2 IMPLANT
DRSG OPSITE POSTOP 4X10 (GAUZE/BANDAGES/DRESSINGS) IMPLANT
DRSG OPSITE POSTOP 4X6 (GAUZE/BANDAGES/DRESSINGS) IMPLANT
DRSG OPSITE POSTOP 4X8 (GAUZE/BANDAGES/DRESSINGS) IMPLANT
DRSG TEGADERM 2-3/8X2-3/4 SM (GAUZE/BANDAGES/DRESSINGS) ×10 IMPLANT
DRSG TEGADERM 4X4.75 (GAUZE/BANDAGES/DRESSINGS) ×2 IMPLANT
ELECT REM PT RETURN 15FT ADLT (MISCELLANEOUS) ×2 IMPLANT
ENDOLOOP SUT PDS II  0 18 (SUTURE)
ENDOLOOP SUT PDS II 0 18 (SUTURE) IMPLANT
EVACUATOR SILICONE 100CC (DRAIN) ×2 IMPLANT
GAUZE SPONGE 2X2 STRL 8-PLY (GAUZE/BANDAGES/DRESSINGS) ×2 IMPLANT
GAUZE SPONGE 4X4 12PLY STRL (GAUZE/BANDAGES/DRESSINGS) IMPLANT
GLOVE BIO SURGEON STRL SZ7.5 (GLOVE) ×6 IMPLANT
GLOVE INDICATOR 8.0 STRL GRN (GLOVE) ×6 IMPLANT
GOWN SRG XL LVL 4 BRTHBL STRL (GOWNS) ×2 IMPLANT
GOWN STRL NON-REIN XL LVL4 (GOWNS) ×2
GOWN STRL REUS W/ TWL XL LVL3 (GOWN DISPOSABLE) ×10 IMPLANT
GOWN STRL REUS W/TWL XL LVL3 (GOWN DISPOSABLE) ×10
GRASPER SUT TROCAR 14GX15 (MISCELLANEOUS) IMPLANT
HOLDER FOLEY CATH W/STRAP (MISCELLANEOUS) ×2 IMPLANT
IRRIG SUCT STRYKERFLOW 2 WTIP (MISCELLANEOUS) ×2
IRRIGATION SUCT STRKRFLW 2 WTP (MISCELLANEOUS) ×2 IMPLANT
KIT PROCEDURE DA VINCI SI (MISCELLANEOUS) ×2
KIT PROCEDURE DVNC SI (MISCELLANEOUS) IMPLANT
KIT TURNOVER KIT A (KITS) IMPLANT
NDL INSUFFLATION 14GA 120MM (NEEDLE) ×2 IMPLANT
NEEDLE INSUFFLATION 14GA 120MM (NEEDLE) ×2 IMPLANT
PACK CARDIOVASCULAR III (CUSTOM PROCEDURE TRAY) ×2 IMPLANT
PACK COLON (CUSTOM PROCEDURE TRAY) ×2 IMPLANT
PAD POSITIONING PINK XL (MISCELLANEOUS) ×2 IMPLANT
PENCIL SMOKE EVACUATOR (MISCELLANEOUS) IMPLANT
PROTECTOR NERVE ULNAR (MISCELLANEOUS) ×4 IMPLANT
RELOAD STAPLE 45 3.5 BLU DVNC (STAPLE) IMPLANT
RELOAD STAPLE 45 4.3 GRN DVNC (STAPLE) IMPLANT
RELOAD STAPLE 60 3.5 BLU DVNC (STAPLE) IMPLANT
RELOAD STAPLE 60 4.3 GRN DVNC (STAPLE) IMPLANT
RELOAD STAPLER 3.5X45 BLU DVNC (STAPLE) IMPLANT
RELOAD STAPLER 3.5X60 BLU DVNC (STAPLE) IMPLANT
RELOAD STAPLER 4.3X45 GRN DVNC (STAPLE) IMPLANT
RELOAD STAPLER 4.3X60 GRN DVNC (STAPLE) ×4 IMPLANT
RETRACTOR WND ALEXIS 18 MED (MISCELLANEOUS) IMPLANT
RTRCTR WOUND ALEXIS 18CM MED (MISCELLANEOUS)
SCISSORS LAP 5X35 DISP (ENDOMECHANICALS) IMPLANT
SEAL CANN UNIV 5-8 DVNC XI (MISCELLANEOUS) ×8 IMPLANT
SEAL XI 5MM-8MM UNIVERSAL (MISCELLANEOUS) ×8
SEALER VESSEL DA VINCI XI (MISCELLANEOUS) ×2
SEALER VESSEL EXT DVNC XI (MISCELLANEOUS) ×2 IMPLANT
SLEEVE ADV FIXATION 5X100MM (TROCAR) IMPLANT
SOL ELECTROSURG ANTI STICK (MISCELLANEOUS) ×2
SOLUTION ELECTROSURG ANTI STCK (MISCELLANEOUS) ×2 IMPLANT
SPIKE FLUID TRANSFER (MISCELLANEOUS) ×2 IMPLANT
STAPLER 60 DA VINCI SURE FORM (STAPLE)
STAPLER 60 SUREFORM DVNC (STAPLE) IMPLANT
STAPLER CANNULA SEAL DVNC XI (STAPLE) ×2 IMPLANT
STAPLER CANNULA SEAL XI (STAPLE) ×2
STAPLER ECHELON POWER CIR 29 (STAPLE) IMPLANT
STAPLER ECHELON POWER CIR 31 (STAPLE) IMPLANT
STAPLER RELOAD 3.5X45 BLU DVNC (STAPLE)
STAPLER RELOAD 3.5X45 BLUE (STAPLE)
STAPLER RELOAD 3.5X60 BLU DVNC (STAPLE)
STAPLER RELOAD 3.5X60 BLUE (STAPLE)
STAPLER RELOAD 4.3X45 GREEN (STAPLE)
STAPLER RELOAD 4.3X45 GRN DVNC (STAPLE)
STAPLER RELOAD 4.3X60 GREEN (STAPLE) ×4
STAPLER RELOAD 4.3X60 GRN DVNC (STAPLE) ×4
STOPCOCK 4 WAY LG BORE MALE ST (IV SETS) ×4 IMPLANT
SURGILUBE 2OZ TUBE FLIPTOP (MISCELLANEOUS) ×2 IMPLANT
SUT MNCRL AB 4-0 PS2 18 (SUTURE) ×2 IMPLANT
SUT PDS AB 1 CT1 27 (SUTURE) IMPLANT
SUT PDS AB 1 TP1 96 (SUTURE) IMPLANT
SUT PROLENE 0 CT 2 (SUTURE) IMPLANT
SUT PROLENE 2 0 KS (SUTURE) ×2 IMPLANT
SUT PROLENE 2 0 SH DA (SUTURE) IMPLANT
SUT SILK 2 0 (SUTURE) ×2
SUT SILK 2 0 SH CR/8 (SUTURE) IMPLANT
SUT SILK 2-0 18XBRD TIE 12 (SUTURE) IMPLANT
SUT SILK 3 0 (SUTURE)
SUT SILK 3 0 SH CR/8 (SUTURE) ×2 IMPLANT
SUT SILK 3-0 18XBRD TIE 12 (SUTURE) ×2 IMPLANT
SUT V-LOC BARB 180 2/0GR6 GS22 (SUTURE)
SUT VIC AB 3-0 SH 18 (SUTURE) IMPLANT
SUT VIC AB 3-0 SH 27 (SUTURE)
SUT VIC AB 3-0 SH 27XBRD (SUTURE) IMPLANT
SUT VIC AB 3-0 SH 8-18 (SUTURE) IMPLANT
SUT VICRYL 0 UR6 27IN ABS (SUTURE) ×2 IMPLANT
SUTURE V-LC BRB 180 2/0GR6GS22 (SUTURE) IMPLANT
SYR 10ML LL (SYRINGE) ×2 IMPLANT
SYS LAPSCP GELPORT 120MM (MISCELLANEOUS)
SYS WOUND ALEXIS 18CM MED (MISCELLANEOUS) ×2
SYSTEM LAPSCP GELPORT 120MM (MISCELLANEOUS) IMPLANT
SYSTEM WOUND ALEXIS 18CM MED (MISCELLANEOUS) ×2 IMPLANT
TAPE UMBILICAL 1/8 X36 TWILL (MISCELLANEOUS) ×2 IMPLANT
TOWEL OR NON WOVEN STRL DISP B (DISPOSABLE) ×2 IMPLANT
TRAY FOLEY MTR SLVR 16FR STAT (SET/KITS/TRAYS/PACK) ×2 IMPLANT
TROCAR ADV FIXATION 5X100MM (TROCAR) ×2 IMPLANT
TUBING CONNECTING 10 (TUBING) ×6 IMPLANT
TUBING INSUFFLATION 10FT LAP (TUBING) ×2 IMPLANT

## 2022-09-01 NOTE — Discharge Instructions (Signed)
POST OP INSTRUCTIONS AFTER COLON SURGERY  DIET: Be sure to include lots of fluids daily to stay hydrated - 64oz of water per day (8, 8 oz glasses).  Avoid fast food or heavy meals for the first couple of weeks as your are more likely to get nauseated. Avoid raw/uncooked fruits or vegetables for the first 4 weeks (its ok to have these if they are blended into smoothie form). If you have fruits/vegetables, make sure they are cooked until soft enough to mash on the roof of your mouth and chew your food well. Otherwise, diet as tolerated.  Take your usually prescribed home medications unless otherwise directed.  PAIN CONTROL: Pain is best controlled by a usual combination of three different methods TOGETHER: Ice/Heat Over the counter pain medication Prescription pain medication Most patients will experience some swelling and bruising around the surgical site.  Ice packs or heating pads (30-60 minutes up to 6 times a day) will help. Some people prefer to use ice alone, heat alone, alternating between ice & heat.  Experiment to what works for you.  Swelling and bruising can take several weeks to resolve.   It is helpful to take an over-the-counter pain medication regularly for the first few weeks: Ibuprofen (Motrin/Advil) - '200mg'$  tabs - take 3 tabs ('600mg'$ ) every 6 hours as needed for pain (unless you have been directed previously to avoid NSAIDs/ibuprofen) Acetaminophen (Tylenol) - you may take '650mg'$  every 6 hours as needed. You can take this with motrin as they act differently on the body. If you are taking a narcotic pain medication that has acetaminophen in it, do not take over the counter tylenol at the same time. NOTE: You may take both of these medications together - most patients  find it most helpful when alternating between the two (i.e. Ibuprofen at 6am, tylenol at 9am, ibuprofen at 12pm ..Marland Kitchen) A  prescription for pain medication should be given to you upon discharge.  Take your pain medication as  prescribed if your pain is not adequatly controlled with the over-the-counter pain reliefs mentioned above.  Avoid getting constipated.  Between the surgery and the pain medications, it is common to experience some constipation.  Increasing fluid intake and taking a fiber supplement (such as Metamucil, Citrucel, FiberCon, MiraLax, etc) 1-2 times a day regularly will usually help prevent this problem from occurring.  A mild laxative (prune juice, Milk of Magnesia, MiraLax, etc) should be taken according to package directions if there are no bowel movements after 48 hours.    Dressing: Your incisions are covered in Dermabond which is like sterile superglue for the skin. This will come off on it's own in a couple weeks. It is waterproof and you may bathe normally starting the day after your surgery in a shower. Avoid baths/pools/lakes/oceans until your wounds have fully healed.  Ostomy care: As instructed with our Wound Ostomy Team. Generally, we target toothpaste consistency ostomy output. Things may be thickened with fiber tablets (FiberCon, etc). Measure your output in milliliters and keep a record of this. IF the ostomy output is > 1.2L (1,200 mL) in 24 hours, you are at risk for dehydration and we need to be aware as there are medications (Imodium) that can be taken to help slow this down. The likelihood for issues with high output are most often in the first couple of weeks following this surgery. It will be important to work to stay hydrated while you have an ileostomy - drinking upwards to 64 oz of water/non-caffeinated  beverages daily (sugar free Gatorade is another option). A referral has been made to the outpatient wound ostomy clinic. Please call to confirm this appointment the week you arrive home. They are an excellent resource moving forward for any potential issues you may run into with bag fittings, leaks, irritation, among others.  ACTIVITIES as tolerated:   Avoid heavy lifting (>10lbs or 1  gallon of milk) for the next 6 weeks. You may resume regular daily activities as tolerated--such as daily self-care, walking, climbing stairs--gradually increasing activities as tolerated.  If you can walk 30 minutes without difficulty, it is safe to try more intense activity such as jogging, treadmill, bicycling, low-impact aerobics.  DO NOT PUSH THROUGH PAIN.  Let pain be your guide: If it hurts to do something, don't do it. You may drive when you are no longer taking prescription pain medication, you can comfortably wear a seatbelt, and you can safely maneuver your car and apply brakes.  FOLLOW UP in our office Please call [facility] at [phone] to set up an appointment to see your surgeon in the office for a follow-up appointment approximately 2 weeks after your surgery. Make sure that you call for this appointment the day you arrive home to insure a convenient appointment time.  9. If you have disability or family leave forms that need to be completed, you may have them completed by your primary care physician's office; for return to work instructions, please ask our office staff and they will be happy to assist you in obtaining this documentation   When to call us [phone]: Poor pain control Reactions / problems with new medications (rash/itching, etc)  Fever over 101.5 F (38.5 C) Inability to urinate Nausea/vomiting Worsening swelling or bruising Continued bleeding from incision. Increased pain, redness, or drainage from the incision  The clinic staff is available to answer your questions during regular business hours (8:30am-5pm).  Please don't hesitate to call and ask to speak to one of our nurses for clinical concerns.   A surgeon from [facility] is always on call at the hospitals   If you have a medical emergency, go to the nearest emergency room or call 911.  [facility], [place] [address], [place], [place], [place]  [address] MAIN: [phone] FAX: [phone] [email or web address]

## 2022-09-01 NOTE — Op Note (Signed)
PATIENT: [name] [name]  62 y.o. male  Patient Care Team: Sharion Balloon, FNP as PCP - General (Family Medicine) Lavera Guise, [facility] as Pharmacist (Family Medicine)  PREOP DIAGNOSIS: RECTAL CANCER  POSTOP DIAGNOSIS: RECTAL CANCER  PROCEDURE:  Robotic assisted low anterior resection with colorectal anastomosis, diverting loop ileostomy Diagnostic flexible sigmoidoscopy (necessary to confirm location of lesion, margin) Robotic lysis of adhesions x 60 minutes Intraoperative assessment of perfusion using ICG fluorescence imaging Bilateral transversus abdominus plane (TAP) blocks  SURGEON: Sharon Mt. Kaelon Weekes, MD  ASSISTANT: Michael Boston, MD  ANESTHESIA: General endotracheal  EBL: 100 mL Total I/O In: 1100 [I.V.:1000; IV Piggyback:100] Out: 300 [Urine:250; Blood:50]  DRAINS: None  SPECIMEN:  Rectosigmoid colon-opened and proximal Distal anastomotic donut  COUNTS: Sponge, needle and instrument counts were reported correct x2  FINDINGS: Adhesions within the lower right lower quadrant between small bowel and small bowel mesentery as well as small bowel and other loops of small bowel.  Adhesiolysis necessary for ultimate creation of diverting loop ileostomy.  Omental containing adhesions across much of his upper abdomen precluding significant visualization of his liver.    No obvious metastatic disease on visceral parietal peritoneum. Tattoo evident in the mid rectum just distal to the peritoneal reflection. On diagnostic flexible sigmoidoscopy, the tattoo is well distal to the endoluminal mass.     A well perfused, tension free, hemostatic, air tight 31 mm EEA colorectal anastomosis fashioned 10 cm from the anal verge by flexible sigmoidoscopy; anatomically this is at the level of the mid rectum and is clearly well distal to the peritoneal reflection.   Diverting loop ileostomy created at conclusion.   NARRATIVE: Informed consent was verified. The patient was taken to  the operating room, placed supine on the operating table and SCD's were applied. General endotracheal anesthesia was induced without difficulty. He was then positioned in the lithotomy position with Allen stirrups.  Pressure points were evaluated and padded.  A foley catheter was then placed by nursing under sterile conditions. Hair on the abdomen was clipped.  He was secured to the operating table. The abdomen was then prepped and draped in the standard sterile fashion. Surgical timeout was called indicating the correct patient, procedure, positioning and need for preoperative antibiotics.   An OG tube was placed by anesthesia and confirmed to be to suction.  At Palmer's point, a stab incision was created and the Veress needle was introduced into the peritoneal cavity on the first attempt.  Intraperitoneal location was confirmed by the aspiration and saline drop test.  Pneumoperitoneum was established to a maximum pressure of 15 mmHg using CO2.  Following this, the abdomen was marked for planned trocar sites.  Just to the right and cephalad to the umbilicus, an 8 mm incision was created and an 8 mm blunt tipped robotic trocar was cautiously placed into the peritoneal cavity.  The laparoscope was inserted and demonstrated no evidence of trocar site nor Veress needle site complications.  The Veress needle was removed.  Bilateral transversus abdominis plane blocks were then created using a dilute mixture of Exparel with Marcaine.  3 additional 8 mm robotic trochars were placed under direct visualization roughly in a line extending from the right ASIS towards the left upper quadrant. The bladder was inspected and noted to be at/below the pubic symphysis.  Staying 3 fingerbreadths above the pubic symphysis, an incision was created and the 12 mm robotic trocar inserted directed cephalad into the peritoneal cavity under direct visualization.  An additional  5 mm assist port was placed in the right lateral abdomen under  direct visualization.  The abdomen was surveyed and there was adhesions in the right lower quadrant between the small bowel, small bowel mesentery, and other adjacent loops of small bowel.  There are also fairly dense omental containing adhesions across much of his right upper quadrant from his prior liver surgery.  The peritoneal surfaces are normal in appearance.  He was positioned in Trendelenburg with the left side tilted slightly up.  Small bowel was carefully retracted out of the pelvis.  The robot was then docked and I went to the console.  We began with adhesiolysis. Adhesions consisting of ileal to ileal adhesions were lysed sharply.  There are also adhesions to much of the small bowel mesentery.  All these adhesions were lysed for a potential diverting loop ileostomy at conclusion of the planned procedure.  After lysing adhesions, the serosal surfaces of the bowel were inspected and there were no evident injuries.  This process took approximately 60 minutes in total.   The sigmoid colon was readily identified. Attachments of the sigmoid colon were taken down from the intersigmoid fossa.  The rectosigmoid colon was grasped and elevated anteriorly.  Beginning with a medial to lateral approach, the peritoneum overlying the presacral space was carefully incised.  The TME plane was readily gained working in a plane between the fascia propria of the rectum and the presacral fascia.  Hypogastric nerves were seen going along the the presacral fascia and were protected free of injury.  Working more proximally, the mesorectum and sigmoid mesentery were carefully mobilized off of the peritoneum.  The left ureter was identified and protected free of injury.  The left gonadal vessels were identified and protected.  These were both swept "down."  The superior hemorrhoidal and IMA pedicles were identified. Further mesocolon was mobilized proximally staying in this plane between the retroperitoneum proper and the  mesocolon. Attention was then turned to the lateral portion of dissection.  The sigmoid colon was then retracted to the right.  The sigmoid colon was fully mobilized. The descending colon was mobilized by incising the Yovan Leeman line of Toldt.  This was done all the way up to the level of the splenic flexure.  The associated mesocolon was also mobilized medially.  The left ureter again was confirmed to be well away from the vasculature which had been dissected medially.  The rectosigmoid colon was elevated anteriorly. The left ureter was re-identified. The IMA was clear of this. The IMA was then divided with the vessel sealer. The stump was inspected and noted to be completely hemostatic with a good seal.  The mesentery was divided out to the point of planned proximal division.   We then directed our attention at the TME portion of the procedure.  Maintaining the plane posteriorly first between the fascia propria the rectum and the presacral fascia, the mesorectum was fully mobilized down to the mid rectum.  Initially, no tattoo was visible.  The rectosigmoid colon was then gently occluded with a bowel grasper.  A flexible sigmoidoscope was then passed by myself transanally and up into the rectum.  The distal rectum is normal in appearance.  Tattoo is evident at approximately the level of the mid rectum.  At least 2 cm proximal to this, there is the evident endoluminal mass.  The tattoo appears to be well distal to it. Pneumorectum is then released.   I went back to the console.  We then turned  our attention back to the TME portion.  We continued our posterior dissection a little further distally maintain the TME plane.  At this juncture, were able to clear the identified black tattoo.  The TME dissection is then continued laterally on both the left and right sides maintaining her plane between the fascia propria and the presacral fascia.  The dissection is then terminated anteriorly.  The peritoneal reflection  was opened sharply as the tattoo was distal to this.  We went down to the level of the tattoo and just a little further.  At the level of the tattoo where we had an approximate 2 cm distal margin, the mesorectum was cleared circumferentially.  There is no evident tumor involvement in the mesorectum at this level.  A 60 mm green load robotic stapler was then placed through the 12 mm port and introduced into the peritoneal cavity.  The rectum was divided with 2 firings of the stapler.  The stump was intact and healthy in appearance.   Attention was turned to performing a perfusion test. ICG was administered by anesthesia and at the level of the cleared mesentery proximally, there was excellent uptake of the tracer.  The rectum was also well perfused in appearance.  The proximal point of planned transection is evaluated and it is supple and healthy in appearance.  This reached into the deep pelvis without any difficulty and remained in that location without any tension. A locking grasper was then placed on the sigmoid staple line.   Attention was turned to the extracorporeal portion of the procedure.  The robot was undocked.  I scrubbed back in.  Using the 12 mm trocar site, a Pfannenstiel incision was created and incorporated the fascial opening through the 12 mm port site.  The rectus fascia was incised and then elevated.  The rectus muscle was mobilized free of the overlying fascia.  The peritoneum was incised in the midline well above the location of the bladder.  An [place] wound protector was placed.  Towels were placed around the field.  The divided colon was passed through the wound protector.  The point of proximal division was identified and was again on a healthy segment of supple colon with a palpable pulse in the mesentery. This was pink in color.  A pursestring device was applied.  A 2-0 Prolene on a Keith needle was passed.  The colon was divided and passed off with the open end being proximal.  EEA  sizers were then introduced and a 31 mm EEA selected.  "Belt loops" consisting of 3-0 silk were placed around the pursestring suture line.  The anvil was placed and the pursestring tied.  A small amount of fat was cleared from the planned anastomosis and no diverticula were apparent within this.  This was placed back into the abdomen and a cap placed over the wound protector port site.  Pneumoperitoneum was reestablished.  I then went below to pass the stapler.  My partner remained above.  EEA sizers were cautiously introduced via the anus and advanced under direct visualization.  The stapler was passed and the spike deployed just anterior to the staple line.  The components were then mated.  Orientation was confirmed such that there is no twisting of the colon nor small bowel underneath the mesenteric defect. Care was taken to ensure no other structures were incorporated within this either.  The stapler was then closed, held, and fired. This was then removed. The donuts were inspected and noted  to be complete.  The colon proximal to the anastomosis was then gently occluded. The pelvis was filled with sterile irrigation. Under direct visualization, I passed a flexible sigmoidoscope.  The anastomosis was under water.  With good distention of the anastomosis there was no air leak. The anastomosis pink in appearance.  This is located at 10 cm from the anal verge by flexible sigmoidoscopy. He is tall and has a rather long rectum. The staple line is hemostatic.  Additionally, looking from above, there is no tension on the colon or mesentery.  Sigmoidoscope was withdrawn.  Irrigation was evacuated from the pelvis.  The abdomen and pelvis are surveyed and noted to be completely hemostatic without any apparent injury.  I scrubbed back in.  We directed our attention at or planned ileostomy would be created.  This is the side of the 8 mm trocar site.  The skin at this location is excised.  The terminal ileum was identified.   We worked back approximately 20 cm and this loop of ileum easily reaches the abdominal wall at the planned ileostomy site without any difficulty.  This is overlying the rectus muscle.  The fascia is incised in a cruciate manner at this location.  The loop of ileum was brought out.  A Kelly clamp was placed through a small mesenteric defect we created to maintain orientation.  We looked again laparoscopically to confirm that there is no twisting of the mesentery which there is not.  Under direct visualization, all trochars are removed.  The Troy wound protector was removed.  Gowns/gloves are changed and a fresh set of clean instruments utilized. Additional sterile drapes were placed around the field.   The Pfannenstiel peritoneum was closed with a running 2-0 Vicryl suture.  The rectus fascia was then closed using 2 running #1 PDS sutures.  The fascia was then palpated and noted to be completely closed.  Additional anesthetic was infiltrated at the Pfannenstiel site.  Sponge, needle, and instrument counts were reported correct x2. 4-0 Monocryl subcuticular suture was used to close the skin of all incision sites.  Dermabond was placed over all incisions.  A honeycomb dressing placed over the Pfannenstiel as well.  Attention is then directed at maturing the ileostomy.  The ileum was incised and the proximal end is matured in a Brooke manner using 3-0 Vicryl suture.  The distal end is secured as well with 3-0 Vicryl.  The ileostomy has a nicely on the abdominal wall and is pink and color.  It is gently digitized and found to be widely patent below the level of the fascia.  An ostomy appliance was then cut to fit.   He was then taken out of lithotomy, awakened from anesthesia, extubated, and transferred to a stretcher for transport to PACU in satisfactory condition having tolerated the procedure well.

## 2022-09-01 NOTE — H&P (Addendum)
CC: Here for surgery  HPI: [name] [name] is an 62 y.o. male with history of HTN, HLD, DM, BPH, whom is seen in the office today as a referral by Dr. Pasty Arch for evaluation of rectal cancer.  Colonoscopy with Dr. Hilarie Fredrickson 10/27/21 -  1. Fungating ulcerated large mass in the rectosigmoid colon, partially circumferential. 4 cm in length. Biopsied. Tattooed distally. 2. 2 mm cecal polyp removed. 3. 6 mm cecal polyp removed. PATH: Rectal biopsy mass moderately differentiated adenocarcinoma. Other polyps were tubular adenomas/benign colon. No dysplasia.  CT CAP 11/03/21 -circumferential wall thickening in the rectum. 2. Lesion of the posterior right lobe of the liver suspicious for liver metastasis. 3. No other evidence of lymphadenopathy or metastatic disease. 4. Hepatomegaly and hepatic steatosis. 5. Prostatomegaly 6. CAD, aortic atherosclerosis.  MRI Abd and pelvis 11/12/21 -  1. cmri [id number] with >6 LNs along sup rectal vein and within mesorectum. +Extramesorectal adenopathy along upper margin of S1 2. 3 x 3.4 x 3 cm subcapsular lesion in posterior right liver seg VII felt to represent liver met.   He had 1 episode of some blood in his stool around Valentine's day this year, none prior or since. He underwent colonoscopy with Dr. Hilarie Fredrickson for further evaluation as noted above.  Case was reviewed at multidisciplinary tumor board. He was also seen with our hepatobiliary surgeon, Dr. Zenia Resides. He underwent systemic therapy with FOLFOX and in the midst of this underwent surgery with Dr. Gypsy Decant laparoscopy, partial right hepatectomy (metastatic ectomy of segment 7 tumor), intraoperative ultrasound.  Pathology demonstrated metastatic adenocarcinoma.  He is now undergoing Xeloda/radiation which began 05/29/2022, planned completion 07/07/22. He was recently seen in the ER 06/18/2022 with midepigastric/right upper quadrant pain. He does have a history of similar in the past particular in the  immediate postoperative period. This radiated into his chest. He thought it was indigestion and took several OTC meds but that did not help. He subsequently presented to the emergency room for further evaluation. He underwent workup including a CT scan which demonstrated gallstones. CBC and LFTs were normal. Lipase was normal. No evident cholecystitis. His symptoms had improved and he was discharged from the emergency room.  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 Similar circumferential rectal wall thickening reflecting patient's known primary neoplasm. 2. Decreased size of the metastatic hepatic lesion. No new suspicious hepatic lesion identified. 3. No evidence of new or progressive disease in the chest, abdomen or pelvis. 4. Mild hepatomegaly and hepatic steatosis. 5. Colonic diverticulosis without findings of acute diverticulitis. 6. Aortic Atherosclerosis (ICD10-I70.0).  CTA Chest 12/24- no PE CT A/P 12/24 - 1. Negative for acute pulmonary embolism. 2. Cholelithiasis. No evidence of cholecystitis or biliary dilation. If there is concern for acute cholecystitis consider right upper quadrant ultrasound. 3. Slight increase in circumferential rectal wall thickening concerning for progression of known primary neoplasm. 4. Interval resection of the metastasis in hepatic segment VII. No new suspicious hepatic lesion. 5. Colonic diverticulosis without diverticulitis. 6. Aortic Atherosclerosis (ICD10-I70.0).  He is here today for scheduled follow-up. He denies any complaints at present aside from the midepigastric pain he experienced a few nights ago. Currently, no nausea or vomiting. No abdominal pain. He denies any rectal bleeding. He did have some significant change in his stools following initiation of radiation including additional mucus. That has since improved and resolved.  Plan radiation completion date of 07/07/2022.  PMH: HTN, HLD, DM, BPH  PSH: Open appendectomy  1980s  FHx:  Multiple relatives with cancer including colon. 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 He returns today with his wife   INTERVAL HX Denies any changes in his health or health history since we met. States he is ready for surgery. Tolerated bowel with satisfactory result  Past Medical History:  Diagnosis Date   Allergy    SEASONAL   Arthritis    Asthma    only due to hey fever   Blood in stool    Cancer ([place]) 10/2021   rectal cancer   Diabetes mellitus without complication ([place]) [id number]   type 2   Family history of breast cancer    Family history of colon cancer    GERD (gastroesophageal reflux disease)    Hay fever    History of back pain    Hx of colonic polyps    Migraines    PONV (postoperative nausea and vomiting)    also history of motion sickness    Past Surgical History:  Procedure Laterality Date   ANTERIOR CERVICAL DECOMP/DISCECTOMY FUSION  02/20/2012   Procedure: ANTERIOR CERVICAL DECOMPRESSION/DISCECTOMY FUSION 1 LEVEL;  Surgeon: Erline Levine, MD;  Location: [facility];  Service: Neurosurgery;  Laterality: N/A;  Cervical six - seven  Anterior cervical decompression/diskectomy/fusion   APPENDECTOMY  06/26/1982   COLONOSCOPY     IR IMAGING GUIDED PORT INSERTION  11/28/2021   right side   KNEE ARTHROSCOPY     LAPAROSCOPY N/A 03/10/2022   Procedure: STAGING LAPAROSCOPY;  Surgeon: Dwan Bolt, MD;  Location: [facility];  Service: General;  Laterality: N/A;   NASAL SEPTUM SURGERY  06/26/2006   OPEN PARTIAL HEPATECTOMY  N/A 03/10/2022   Procedure: OPEN PARTIAL HEPATECTOMY;  Surgeon: Dwan Bolt, MD;  Location: [facility];  Service: General;  Laterality: N/A;   SPINAL CORD STIMULATOR INSERTION N/A 03/10/2021   Procedure: [place];  Surgeon: Melina Schools, MD;  Location: [facility];  Service:  Orthopedics;  Laterality: N/A;  2.5 HRS 3C-BED   WISDOM TOOTH EXTRACTION      Family History  Problem Relation Age of Onset   Diabetes Mother    Breast cancer Mother 74   Diabetes Father        type II   Colon polyps Sister        11+ polyps   Other Brother        reportedly negative genetic testing   Diabetes Brother    Colon cancer Maternal Aunt    Colon cancer Maternal Uncle        x2   Colon cancer Maternal Uncle        x3   Colon cancer Maternal Uncle    Colon cancer Maternal Uncle    Colon cancer Maternal Uncle    Colon cancer Maternal Uncle    Other Maternal Uncle        farm accident as a teen   Colon cancer Maternal Grandmother    Cancer Maternal Grandmother        breast, colon    Diabetes Paternal Grandfather    Colon cancer Cousin        <50   Colon cancer Cousin        maternal cousin's children   Esophageal cancer Neg Hx    Rectal cancer Neg Hx    Stomach cancer Neg Hx  Social:  reports that he quit smoking about 37 years ago. His smoking use included cigarettes. He smoked an average of 1 pack per day. He has never been exposed to tobacco smoke. He has never used smokeless tobacco. He reports that he does not currently use alcohol. He reports that he does not use drugs.  Allergies:  Allergies  Allergen Reactions   Other Swelling    Hayfever,GRASS,DOGS,CATS    Medications: I have reviewed the patient's current medications.  Results for orders placed or performed during the hospital encounter of 09/01/22 (from the past 48 hour(s))  Glucose, capillary     Status: Abnormal   Collection Time: 09/01/22  5:43 AM  Result Value Ref Range   Glucose-Capillary 225 (H) 70 - 99 mg/dL    Comment: Glucose reference range applies only to samples taken after fasting for at least 8 hours.    No results found.  ROS - all of the below systems have been reviewed with the patient and positives are indicated with bold text General: chills, fever or night  sweats Eyes: blurry vision or double vision ENT: epistaxis or sore throat Allergy/Immunology: itchy/watery eyes or nasal congestion Hematologic/Lymphatic: bleeding problems, blood clots or swollen lymph nodes Endocrine: temperature intolerance or unexpected weight changes Breast: new or changing breast lumps or nipple discharge Resp: cough, shortness of breath, or wheezing CV: chest pain or dyspnea on exertion GI: as per HPI GU: dysuria, trouble voiding, or hematuria MSK: joint pain or joint stiffness Neuro: TIA or stroke symptoms Derm: pruritus and skin lesion changes Psych: anxiety and depression  PE Blood pressure (!) 144/92, pulse 91, temperature 98.6 F (37 C), temperature source Oral, resp. rate 16, height 6' (1.829 m), weight 112.9 kg, SpO2 96 %. Constitutional: NAD; conversant Eyes: Moist conjunctiva Lungs: Normal respiratory effort CV: RRR MSK: Normal range of motion of extremities Psychiatric: Appropriate affect  Results for orders placed or performed during the hospital encounter of 09/01/22 (from the past 48 hour(s))  Glucose, capillary     Status: Abnormal   Collection Time: 09/01/22  5:43 AM  Result Value Ref Range   Glucose-Capillary 225 (H) 70 - 99 mg/dL    Comment: Glucose reference range applies only to samples taken after fasting for at least 8 hours.    No results found.  A/P: SHADRICK BIERLEY is an 62 y.o. male with hx of HTN, HLD, DM, BPH, here for evaluation of newly diagnosed presumed metastatic rectal adenocarcinoma  Appears to be mid-rectum on MRI  [id number] FOLFOX OR with Dr. Zenia Resides 03/10/22 -open right partial hepatectomy-segment 7 tumor  Now underoing chemo/XRT with planned completion 07/07/22  -The anatomy and physiology of the GI tract was reviewed with the patient. The pathophysiology of rectal cancer was discussed as well with associated pictures. -We have discussed various different treatment options going forward including surgery  (the most definitive) to address this - Robotic-assisted low anterior resection, diverting loop ileostomy, flexible sigmoidoscopy -Given the tumor location which is in the more proximal rectum and therefore too proximal for digital palpation of full extent of lesion, not being a good candidate for any sort of watch and wait type approaches. -The planned procedures, material risks (including, but not limited to, pain, bleeding, infection, scarring, need for blood transfusion, damage to surrounding structures- blood vessels/nerves/viscus/organs, damage to ureter, urine leak, leak from anastomosis, need for additional procedures, sexual dysfunction including potential for erectile dysfunction, rational for stoma creation, possibility of permanent stomas although less common in proximal rectal  cancers, worsening of pre-existing medical conditions, hernia, recurrence, pneumonia, heart attack, stroke) benefits and alternatives to surgery were discussed at length. The patient's questions were answered to his satisfaction, he voiced understanding and elected to proceed with surgery. Additionally, we discussed typical postoperative expectations and the recovery process.  -He has indicated he has watched multiple videos online regarding the planned surgery and was actually able to describe many of the surgical techniques which were utilized in our planned procedure as well!  -I have encouraged him to call with any other questions or concerns that he may have between now and we plan to do surgery. We will tentatively plan for surgery in March, first half if able.  Nadeen Landau, [facility], [place]

## 2022-09-01 NOTE — Anesthesia Postprocedure Evaluation (Signed)
Anesthesia Post Note  Patient: [name] [name]  Procedure(s) Performed: ROBOTIC ASSISTED LOWER ANTERIOR RESECTION AND INTRAOPERATIVE ASSESSMENT OF PERFUSION USING FIREFLY DYE DIVERTING LOOP ILEOSTOMY FLEXIBLE SIGMOIDOSCOPY LAPAROSCOPIC LYSIS OF ADHESIONS (Abdomen)     Patient location during evaluation: PACU Anesthesia Type: General Level of consciousness: awake and alert Pain management: pain level controlled Vital Signs Assessment: post-procedure vital signs reviewed and stable Respiratory status: spontaneous breathing, nonlabored ventilation, respiratory function stable and patient connected to nasal cannula oxygen Cardiovascular status: blood pressure returned to baseline and stable Postop Assessment: no apparent nausea or vomiting Anesthetic complications: no   No notable events documented.  Last Vitals:  Vitals:   09/01/22 0554  BP: (!) 144/92  Pulse: 91  Resp: 16  Temp: 37 C  SpO2: 96%    Last Pain:  Vitals:   09/01/22 0603  TempSrc:   PainSc: 0-No pain                 Thailyn Khalid

## 2022-09-01 NOTE — Consult Note (Signed)
[place] Nurse ostomy consult note Consult received for new ostomy created today by Dr. Dema Severin (Ileostomy, loop, no rod). [place] Nurse consult will occur on Monday, 09/04/22.  [facility] nursing team will follow, and will remain available to this patient, the nursing and medical teams.  Thank you for inviting [place] to participate in this patient's Plan of Care.  Maudie Flakes, MSN, RN, CNS, [place], Serita Grammes, [employer], [employer] phone:  [phone]

## 2022-09-01 NOTE — Anesthesia Procedure Notes (Signed)
Procedure Name: Intubation Date/Time: 09/01/2022 7:39 AM  Performed by: Claudia Desanctis, CRNAPre-anesthesia Checklist: Patient identified, Emergency Drugs available, Suction available and Patient being monitored Patient Re-evaluated:Patient Re-evaluated prior to induction Oxygen Delivery Method: Circle system utilized Preoxygenation: Pre-oxygenation with 100% oxygen Induction Type: IV induction Ventilation: Mask ventilation without difficulty Laryngoscope Size: 2 and Miller Grade View: Grade I Tube type: Oral Tube size: 7.5 mm Number of attempts: 1 Airway Equipment and Method: Stylet Placement Confirmation: ETT inserted through vocal cords under direct vision, positive ETCO2 and breath sounds checked- equal and bilateral Secured at: 22 cm Tube secured with: Tape Dental Injury: Teeth and Oropharynx as per pre-operative assessment

## 2022-09-01 NOTE — Progress Notes (Signed)
Pt is alert and oriented x4. Wife at bedside. Bed exit alarm remains on. He has been instructed to not get oob without assistance from nurse or nurse tech. Both he and his wife voiced understanding.

## 2022-09-01 NOTE — Transfer of Care (Signed)
Immediate Anesthesia Transfer of Care Note  Patient: [name] [name]  Procedure(s) Performed: ROBOTIC ASSISTED LOWER ANTERIOR RESECTION AND INTRAOPERATIVE ASSESSMENT OF PERFUSION USING FIREFLY DYE DIVERTING LOOP ILEOSTOMY FLEXIBLE SIGMOIDOSCOPY LAPAROSCOPIC LYSIS OF ADHESIONS (Abdomen)  Patient Location: PACU  Anesthesia Type:General  Level of Consciousness: drowsy  Airway & Oxygen Therapy: Patient Spontanous Breathing and Patient connected to face mask  Post-op Assessment: Report given to RN and Post -op Vital signs reviewed and stable  Post vital signs: Reviewed and stable  Last Vitals:  Vitals Value Taken Time  BP 152/93 09/01/22 1149  Temp    Pulse 96 09/01/22 1153  Resp 12 09/01/22 1153  SpO2 96 % 09/01/22 1153  Vitals shown include unvalidated device data.  Last Pain:  Vitals:   09/01/22 0603  TempSrc:   PainSc: 0-No pain         Complications: No notable events documented.

## 2022-09-02 ENCOUNTER — Encounter (HOSPITAL_COMMUNITY): Payer: Self-pay | Admitting: Surgery

## 2022-09-02 LAB — GLUCOSE, CAPILLARY
Glucose-Capillary: 162 mg/dL — ABNORMAL HIGH (ref 70–99)
Glucose-Capillary: 149 mg/dL — ABNORMAL HIGH (ref 70–99)
Glucose-Capillary: 194 mg/dL — ABNORMAL HIGH (ref 70–99)
Glucose-Capillary: 193 mg/dL — ABNORMAL HIGH (ref 70–99)

## 2022-09-02 LAB — BASIC METABOLIC PANEL
Anion gap: 5 (ref 5–15)
BUN: 7 mg/dL — ABNORMAL LOW (ref 8–23)
CO2: 24 mmol/L (ref 22–32)
Calcium: 7.9 mg/dL — ABNORMAL LOW (ref 8.9–10.3)
Chloride: 102 mmol/L (ref 98–111)
Creatinine, Ser: 0.82 mg/dL (ref 0.61–1.24)
GFR, Estimated: >60 mL/min (ref >60)
Glucose, Bld: 170 mg/dL — ABNORMAL HIGH (ref 70–99)
Potassium: 3.4 mmol/L — ABNORMAL LOW (ref 3.5–5.1)
Sodium: 131 mmol/L — ABNORMAL LOW (ref 135–145)

## 2022-09-02 LAB — CBC
HCT: 36.6 % — ABNORMAL LOW (ref 39.0–52.0)
Hemoglobin: 11.9 g/dL — ABNORMAL LOW (ref 13.0–17.0)
MCH: 30.7 pg (ref 26.0–34.0)
MCHC: 32.5 g/dL (ref 30.0–36.0)
MCV: 94.3 fL (ref 80.0–100.0)
Platelets: 161 10*3/uL (ref 150–400)
RBC: 3.88 MIL/uL — ABNORMAL LOW (ref 4.22–5.81)
RDW: 14.6 % (ref 11.5–15.5)
WBC: 5.5 10*3/uL (ref 4.0–10.5)
nRBC: 0 % (ref 0.0–0.2)

## 2022-09-02 MED ORDER — POTASSIUM CHLORIDE CRYS ER 20 MEQ PO TBCR
40.0000 meq | EXTENDED_RELEASE_TABLET | Freq: Once | ORAL | Status: AC
  Administered 2022-09-02: 40 meq via ORAL
  Filled 2022-09-02: qty 2

## 2022-09-02 NOTE — Progress Notes (Signed)
OT Cancellation Note  Patient Details Name: [name] [name] MRN: [id number] DOB: 04-Sep-1960   Cancelled Treatment:    Reason Eval/Treat Not Completed: OT screened, no needs identified, will sign off. Patient ambulating and has assistance for ADLs if needed from wife.  [name] [name] 09/02/2022, 10:52 AM

## 2022-09-02 NOTE — Progress Notes (Signed)
PT Cancellation Note  Patient Details Name: [name] [name] MRN: [id number] DOB: 1960/09/13   Cancelled Treatment:     PT order received but eval deferred.  Pt IND in room and halls and with no PT needs identified.  Will DC PT.   Lacresha Fusilier 09/02/2022, 12:34 PM

## 2022-09-02 NOTE — Progress Notes (Addendum)
1 Day Post-Op   Subjective/Chief Complaint: No n/v Some pain Nurses have emptied ostomy bag a few times Wife at [place] [facility] Asking for PB Walked several times Documented 150 ileosotmy   Objective: Vital signs in last 24 hours: Temp:  [97.5 F (36.4 C)-98.9 F (37.2 C)] 98.1 F (36.7 C) (03/09 0440) Pulse Rate:  [84-103] 84 (03/09 0440) Resp:  [12-19] 16 (03/09 0440) BP: (106-169)/(68-101) 106/68 (03/09 0440) SpO2:  [91 %-99 %] 91 % (03/09 0440) Weight:  [115.5 kg] 115.5 kg (03/09 0439) Last BM Date : 08/31/22  Intake/Output from previous day: 03/08 0701 - 03/09 0700 In: 4434.5 [P.O.:820; I.V.:3514.5; IV Piggyback:100] Out: 2950 [Urine:2750; Stool:150; Blood:50] Intake/Output this shift: No intake/output data recorded.  Alert, nontoxic Symm chest rise Soft, mild obesity, old incisions, new incisions ok Mild approp TTP; ostomy bag secure with liquid stool in bag +scds  Lab Results:  Recent Labs    09/02/22 0158  WBC 5.5  HGB 11.9*  HCT 36.6*  PLT 161   BMET Recent Labs    09/02/22 0158  NA 131*  K 3.4*  CL 102  CO2 24  GLUCOSE 170*  BUN 7*  CREATININE 0.82  CALCIUM 7.9*   PT/INR No results for input(s): "LABPROT", "INR" in the last 72 hours. ABG No results for input(s): "PHART", "HCO3" in the last 72 hours.  Invalid input(s): "PCO2", "PO2"  Studies/Results: No results found.  Anti-infectives: Anti-infectives (From admission, onward)    Start     Dose/Rate Route Frequency Ordered Stop   09/01/22 1400  neomycin (MYCIFRADIN) tablet 1,000 mg  Status:  Discontinued       See Hyperspace for full Linked Orders Report.   1,000 mg Oral 3 times per day 09/01/22 0534 09/01/22 0542   09/01/22 1400  metroNIDAZOLE (FLAGYL) tablet 1,000 mg  Status:  Discontinued       See Hyperspace for full Linked Orders Report.   1,000 mg Oral 3 times per day 09/01/22 0534 09/01/22 0542   09/01/22 0600  cefoTEtan (CEFOTAN) 2 g in sodium chloride 0.9 % 100 mL  IVPB        2 g 200 mL/hr over 30 Minutes Intravenous On call to O.R. 09/01/22 0534 09/01/22 0810       Assessment/Plan: s/p Procedure(s): ROBOTIC ASSISTED LOWER ANTERIOR RESECTION AND INTRAOPERATIVE ASSESSMENT OF PERFUSION USING FIREFLY DYE (N/A) DIVERTING LOOP ILEOSTOMY (N/A) FLEXIBLE SIGMOIDOSCOPY (N/A) LAPAROSCOPIC LYSIS OF ADHESIONS   Adv diet to FLD and then adv to carb modified Looks good Ambulate, pulm toilet Chemical vte prophylaxis Some hyponatremia and mild hypokalemia Replace potassium Monitor ostomy output - if gets high - fiber then imodium Ostomy teaching DM2 - cont SSI, on some home meds; may need to restart long acting insulin at lower dose, consult diabetes nurse  [name] [name]. [name], MD, [place], Bariatric, & Minimally Invasive Surgery [facility],  [facility]     LOS: 1 day    [name] [name] 09/02/2022

## 2022-09-03 LAB — BASIC METABOLIC PANEL
Anion gap: 10 (ref 5–15)
BUN: 9 mg/dL (ref 8–23)
CO2: 22 mmol/L (ref 22–32)
Calcium: 8.6 mg/dL — ABNORMAL LOW (ref 8.9–10.3)
Chloride: 105 mmol/L (ref 98–111)
Creatinine, Ser: 0.89 mg/dL (ref 0.61–1.24)
GFR, Estimated: >60 mL/min (ref >60)
Glucose, Bld: 147 mg/dL — ABNORMAL HIGH (ref 70–99)
Potassium: 3.6 mmol/L (ref 3.5–5.1)
Sodium: 137 mmol/L (ref 135–145)

## 2022-09-03 LAB — CBC
HCT: 38.3 % — ABNORMAL LOW (ref 39.0–52.0)
Hemoglobin: 12.2 g/dL — ABNORMAL LOW (ref 13.0–17.0)
MCH: 30.4 pg (ref 26.0–34.0)
MCHC: 31.9 g/dL (ref 30.0–36.0)
MCV: 95.5 fL (ref 80.0–100.0)
Platelets: 148 10*3/uL — ABNORMAL LOW (ref 150–400)
RBC: 4.01 MIL/uL — ABNORMAL LOW (ref 4.22–5.81)
RDW: 14.6 % (ref 11.5–15.5)
WBC: 5.6 10*3/uL (ref 4.0–10.5)
nRBC: 0 % (ref 0.0–0.2)

## 2022-09-03 LAB — GLUCOSE, CAPILLARY
Glucose-Capillary: 168 mg/dL — ABNORMAL HIGH (ref 70–99)
Glucose-Capillary: 186 mg/dL — ABNORMAL HIGH (ref 70–99)
Glucose-Capillary: 188 mg/dL — ABNORMAL HIGH (ref 70–99)
Glucose-Capillary: 237 mg/dL — ABNORMAL HIGH (ref 70–99)

## 2022-09-03 LAB — MAGNESIUM: Magnesium: 2 mg/dL (ref 1.7–2.4)

## 2022-09-03 MED ORDER — CALCIUM POLYCARBOPHIL 625 MG PO TABS
625.0000 mg | ORAL_TABLET | Freq: Two times a day (BID) | ORAL | Status: DC
  Administered 2022-09-03 – 2022-09-07 (×7): 625 mg via ORAL
  Filled 2022-09-03 (×9): qty 1

## 2022-09-03 MED ORDER — LOPERAMIDE HCL 2 MG PO CAPS
2.0000 mg | ORAL_CAPSULE | Freq: Two times a day (BID) | ORAL | Status: DC
  Administered 2022-09-03 – 2022-09-04 (×3): 2 mg via ORAL
  Filled 2022-09-03 (×3): qty 1

## 2022-09-03 NOTE — Progress Notes (Signed)
2 Days Post-Op   Subjective/Chief Complaint: No n/v Pain better Nurses have emptied ostomy bag a few times Wife at [place] several times Documented 2350 ileosotmy   Objective: Vital signs in last 24 hours: Temp:  [98.2 F (36.8 C)-98.9 F (37.2 C)] 98.7 F (37.1 C) (03/10 0546) Pulse Rate:  [80-96] 80 (03/10 0546) Resp:  [16-18] 16 (03/10 0546) BP: (110-131)/(66-75) 113/70 (03/10 0546) SpO2:  [93 %-100 %] 94 % (03/10 0546) Weight:  [113.4 kg] 113.4 kg (03/10 0500) Last BM Date : 09/02/22  Intake/Output from previous day: 03/09 0701 - 03/10 0700 In: 3779.3 [P.O.:1920; I.V.:1859.3] Out: 7500 [Urine:5150; Stool:2350] Intake/Output this shift: Total I/O In: 0  Out: 300 [Urine:300]  Alert, nontoxic Symm chest rise Soft, mild obesity, old incisions, new incisions ok Mild approp TTP; ostomy bag secure with liquid stool in bag +scds  Lab Results:  Recent Labs    09/02/22 0158 09/03/22 0146  WBC 5.5 5.6  HGB 11.9* 12.2*  HCT 36.6* 38.3*  PLT 161 148*    BMET Recent Labs    09/02/22 0158 09/03/22 0146  NA 131* 137  K 3.4* 3.6  CL 102 105  CO2 24 22  GLUCOSE 170* 147*  BUN 7* 9  CREATININE 0.82 0.89  CALCIUM 7.9* 8.6*    PT/INR No results for input(s): "LABPROT", "INR" in the last 72 hours. ABG No results for input(s): "PHART", "HCO3" in the last 72 hours.  Invalid input(s): "PCO2", "PO2"  Studies/Results: No results found.  Anti-infectives: Anti-infectives (From admission, onward)    Start     Dose/Rate Route Frequency Ordered Stop   09/01/22 1400  neomycin (MYCIFRADIN) tablet 1,000 mg  Status:  Discontinued       See Hyperspace for full Linked Orders Report.   1,000 mg Oral 3 times per day 09/01/22 0534 09/01/22 0542   09/01/22 1400  metroNIDAZOLE (FLAGYL) tablet 1,000 mg  Status:  Discontinued       See Hyperspace for full Linked Orders Report.   1,000 mg Oral 3 times per day 09/01/22 0534 09/01/22 0542   09/01/22 0600   cefoTEtan (CEFOTAN) 2 g in sodium chloride 0.9 % 100 mL IVPB        2 g 200 mL/hr over 30 Minutes Intravenous On call to O.R. 09/01/22 0534 09/01/22 0810       Assessment/Plan: s/p Procedure(s): ROBOTIC ASSISTED LOWER ANTERIOR RESECTION AND INTRAOPERATIVE ASSESSMENT OF PERFUSION USING FIREFLY DYE (N/A) DIVERTING LOOP ILEOSTOMY (N/A) FLEXIBLE SIGMOIDOSCOPY (N/A) LAPAROSCOPIC LYSIS OF ADHESIONS   Adv diet to carb modified Looks good Ambulate, pulm toilet Chemical vte prophylaxis FEN - lytes improved. Monitory lytes given ileostomy output; KVO. Discussed keeping up with ostomy output with oral intake - water, pedialyte, G2 gatorade High  ostomy output -start bid fiber and imodioum Ostomy teaching DM2 - cont SSI, on some home meds; BS better past 24 hrs consult diabetes nurse  [name]. [name], MD, [place], Bariatric, & Minimally Invasive Surgery [facility],  [facility]     LOS: 2 days    [name] [name] 09/03/2022

## 2022-09-03 NOTE — Inpatient Diabetes Management (Signed)
Inpatient Diabetes Program Recommendations  AACE/ADA: New Consensus Statement on Inpatient Glycemic Control (2015)  Target Ranges:  Prepandial:   less than 140 mg/dL      Peak postprandial:   less than 180 mg/dL (1-2 hours)      Critically ill patients:  140 - 180 mg/dL   Lab Results  Component Value Date   GLUCAP 186 (H) 09/03/2022   HGBA1C 6.2 (H) 08/23/2022    Review of Glycemic Control  Diabetes history: DM2 Outpatient Diabetes medications: Trulicity 1.5 mg weekly, Jardiance 25 QAM, metformin 500 mg BID Current orders for Inpatient glycemic control: Jardiance 25 mg QD, Novolog 0-15 TID with meals and 0-5 HS  HgbA1C - 6.2% Previously on insulin - Lantus 30 QHS, Humalog 8 TID  CBGs trending well. 168, 186 mg/dL today  Inpatient Diabetes Program Recommendations:    Agree with orders.  Will see pt on Monday, March 11th to speak with him regarding his diabetes control and see if he has any questions/concerns.  Will follow.  Thank you. Lorenda Peck, RD, LDN, [place] Inpatient Diabetes Coordinator [phone]

## 2022-09-04 ENCOUNTER — Other Ambulatory Visit (HOSPITAL_COMMUNITY): Payer: Self-pay

## 2022-09-04 LAB — BASIC METABOLIC PANEL
Anion gap: 5 (ref 5–15)
BUN: 15 mg/dL (ref 8–23)
CO2: 23 mmol/L (ref 22–32)
Calcium: 8.2 mg/dL — ABNORMAL LOW (ref 8.9–10.3)
Chloride: 106 mmol/L (ref 98–111)
Creatinine, Ser: 0.82 mg/dL (ref 0.61–1.24)
GFR, Estimated: >60 mL/min (ref >60)
Glucose, Bld: 215 mg/dL — ABNORMAL HIGH (ref 70–99)
Potassium: 3.6 mmol/L (ref 3.5–5.1)
Sodium: 134 mmol/L — ABNORMAL LOW (ref 135–145)

## 2022-09-04 LAB — CBC
HCT: 38.4 % — ABNORMAL LOW (ref 39.0–52.0)
Hemoglobin: 12.5 g/dL — ABNORMAL LOW (ref 13.0–17.0)
MCH: 30.4 pg (ref 26.0–34.0)
MCHC: 32.6 g/dL (ref 30.0–36.0)
MCV: 93.4 fL (ref 80.0–100.0)
Platelets: 145 10*3/uL — ABNORMAL LOW (ref 150–400)
RBC: 4.11 MIL/uL — ABNORMAL LOW (ref 4.22–5.81)
RDW: 13.9 % (ref 11.5–15.5)
WBC: 4.4 10*3/uL (ref 4.0–10.5)
nRBC: 0 % (ref 0.0–0.2)

## 2022-09-04 LAB — GLUCOSE, CAPILLARY
Glucose-Capillary: 183 mg/dL — ABNORMAL HIGH (ref 70–99)
Glucose-Capillary: 196 mg/dL — ABNORMAL HIGH (ref 70–99)
Glucose-Capillary: 187 mg/dL — ABNORMAL HIGH (ref 70–99)
Glucose-Capillary: 174 mg/dL — ABNORMAL HIGH (ref 70–99)

## 2022-09-04 LAB — SURGICAL PATHOLOGY

## 2022-09-04 LAB — MAGNESIUM: Magnesium: 2.2 mg/dL (ref 1.7–2.4)

## 2022-09-04 NOTE — Consult Note (Addendum)
[facility] Nurse ostomy consult note Pt had ileostomy surgery performed on 3/8 Teaching session performed with Pt using a hand-held mirror and his wife was at the bedside.  Stoma type/location: Stoma is red and viable, 1 1/4 inches, slightly above skin level.  Peristomal assessment: patchy areas of red scattered blisters to outer right tape boarder.  Treatment options for stomal/peristomal skin: intact skin surrounding Output: mod amt liquid brown stool Ostomy pouching: 1pc  Education provided:  Demonstrated pouch change using barrier ring and one piece flexible convex pouch. Pt and wife assisted with stretching the barrier ring and applying the pouch. They are both able to open and close to empty without assistance. Educational materials left at the bedside, and ordered 5 sets of supplies to the room.  Use supplies: barrier ring, [place] # [id number], and convex Roselee Culver # [id number].  Reviewed pouching routines and ordering supplies. Pt asked appropriate questions.  Enrolled patient in [place] program: Yes, today.  I will check tomorrow morning to see if they would like another teaching session performed.  Thank-you,  Julien Girt MSN, [place], [place], [place], [place]

## 2022-09-04 NOTE — Inpatient Diabetes Management (Signed)
Inpatient Diabetes Program Recommendations  AACE/ADA: New Consensus Statement on Inpatient Glycemic Control (2015)  Target Ranges:  Prepandial:   less than 140 mg/dL      Peak postprandial:   less than 180 mg/dL (1-2 hours)      Critically ill patients:  140 - 180 mg/dL   Lab Results  Component Value Date   GLUCAP 174 (H) 09/04/2022   HGBA1C 6.2 (H) 08/23/2022    Review of Glycemic Control  Diabetes history: DM2 Outpatient Diabetes medications: Trulicity 1.5 mg weekly, Jardiance 25 QAM, metformin 500 BID Current orders for Inpatient glycemic control: Novolog 0-15 TID with meals and 0-5 HS, Jardiance 25 mg QAM  CBGs 183, 196, 187, 174 mg/dL HgbA1C - 6.2% Previously on Toujeo 30 QHS and Humalog 8 units TID approximately 6 months ago  Inpatient Diabetes Program Recommendations:    Agree with orders.  Spoke with pt and wife at bedside this afternoon regarding his diabetes, glucose control and HgbA1C of 6.2%. Pt states he wears Libre CGM and since wearing CGM, his blood sugars have improved. Tries to eat healthy most of the time, watches portions, although eats a few sweets and ice cream in moderation. Complimented pt on his glycemic control and pt satisfied with all aspects of diabetes care while hospitalized. Answered all questions.   Thank you. Lorenda Peck, RD, LDN, [place] Inpatient Diabetes Coordinator [phone]

## 2022-09-04 NOTE — TOC Transition Note (Signed)
Transition of Care [facility]) - CM/SW Discharge Note   Patient Details  Name: [name] [name] MRN: [id number] Date of Birth: [birth year]  Transition of Care [facility]) CM/SW Contact:  Lennart Pall, LCSW Phone Number: 09/04/2022, 3:09 PM   Clinical Narrative:     Met with pt and wife to discuss dc needs.  Order placed for TOC to assist with [place] arrangement.  Pt agreeable and no agency preference.  Able to secure coverage with [facility].  Pt anticipates dc tomorrow and denies any further needs.  Final next level of care: [place] Barriers to Discharge: No Barriers Identified   Patient Goals and CMS Choice      Discharge Placement                         Discharge Plan and Services Additional resources added to the After Visit Summary for                  DME Arranged: N/A DME Agency: NA       HH Arranged: RN [place] Agency: [place] ([place]) Date [place]: 09/04/22 Time [place]: 1509 Representative spoke with at [place]: [place] ([place]) Interventions SDOH Screenings   Food Insecurity: No Food Insecurity (09/01/2022)  Housing: Low Risk  (09/01/2022)  Transportation Needs: No Transportation Needs (09/01/2022)  Utilities: Not At Risk (09/01/2022)  Alcohol Screen: Low Risk  (11/22/2021)  Depression (PHQ2-9): Low Risk  (05/15/2022)  Financial Resource Strain: Low Risk  (11/22/2021)  Social Connections: Unknown (11/22/2021)  Stress: Stress Concern Present (11/22/2021)  Tobacco Use: Medium Risk (09/02/2022)     Readmission Risk Interventions    09/04/2022    3:07 PM  Readmission Risk Prevention Plan  Transportation Screening Complete  PCP or Specialist Appt within 5-7 Days Complete  Home Care Screening Complete  Medication Review (RN CM) Complete

## 2022-09-04 NOTE — Progress Notes (Signed)
Hold imodium per Dr. Harlow Asa. Patient has not had much stool since he took the imodium and has had pressure and pain from being constipated. He is feeling better this afternoon.

## 2022-09-04 NOTE — Progress Notes (Signed)
3 Days Post-Op   Subjective/Chief Complaint: No n/v. Tolerating diet. Doing well. No complaints.  Objective: Vital signs in last 24 hours: Temp:  [98 F (36.7 C)-98.8 F (37.1 C)] 98 F (36.7 C) (03/11 0525) Pulse Rate:  [72-91] 72 (03/11 0525) Resp:  [14-17] 16 (03/11 0525) BP: (109-130)/(79-95) 109/83 (03/11 0525) SpO2:  [94 %-96 %] 96 % (03/11 0525) Weight:  [113.6 kg] 113.6 kg (03/11 0500) Last BM Date : 09/03/22  Intake/Output from previous day: 03/10 0701 - 03/11 0700 In: 940 [P.O.:940] Out: 3725 [Urine:2850; Stool:875] Intake/Output this shift: No intake/output data recorded.  Alert, nontoxic Symm chest rise Soft, mild obesity, old incisions, new incisions ok Mild approp TTP; ostomy bag secure with liquid stool in bag +scds  Lab Results:  Recent Labs    09/03/22 0146 09/04/22 0240  WBC 5.6 4.4  HGB 12.2* 12.5*  HCT 38.3* 38.4*  PLT 148* 145*   BMET Recent Labs    09/03/22 0146 09/04/22 0240  NA 137 134*  K 3.6 3.6  CL 105 106  CO2 22 23  GLUCOSE 147* 215*  BUN 9 15  CREATININE 0.89 0.82  CALCIUM 8.6* 8.2*   PT/INR No results for input(s): "LABPROT", "INR" in the last 72 hours. ABG No results for input(s): "PHART", "HCO3" in the last 72 hours.  Invalid input(s): "PCO2", "PO2"  Studies/Results: No results found.  Anti-infectives: Anti-infectives (From admission, onward)    Start     Dose/Rate Route Frequency Ordered Stop   09/01/22 1400  neomycin (MYCIFRADIN) tablet 1,000 mg  Status:  Discontinued       See Hyperspace for full Linked Orders Report.   1,000 mg Oral 3 times per day 09/01/22 0534 09/01/22 0542   09/01/22 1400  metroNIDAZOLE (FLAGYL) tablet 1,000 mg  Status:  Discontinued       See Hyperspace for full Linked Orders Report.   1,000 mg Oral 3 times per day 09/01/22 0534 09/01/22 0542   09/01/22 0600  cefoTEtan (CEFOTAN) 2 g in sodium chloride 0.9 % 100 mL IVPB        2 g 200 mL/hr over 30 Minutes Intravenous On call to O.R.  09/01/22 0534 09/01/22 0810       Assessment/Plan: s/p Procedure(s): ROBOTIC ASSISTED LOWER ANTERIOR RESECTION AND INTRAOPERATIVE ASSESSMENT OF PERFUSION USING FIREFLY DYE (N/A) DIVERTING LOOP ILEOSTOMY (N/A) FLEXIBLE SIGMOIDOSCOPY (N/A) LAPAROSCOPIC LYSIS OF ADHESIONS  Carb modified diet as tolerated Ambulate, pulm toilet FEN - lytes improved. Monitory lytes given ileostomy output; KVO. Discussed keeping up with ostomy output with oral intake - water, pedialyte, G2 gatorade Output on ileostomy coming down nicely on fibercon/imodium Ostomy teaching pending; he feels well DM2 - cont SSI, on some home meds; BS better past 24 hrs consult diabetes nurse  Nadeen Landau, MD [facility], [facility] [facility]   LOS: 3 days    [facility] 09/04/2022

## 2022-09-05 LAB — GLUCOSE, CAPILLARY
Glucose-Capillary: 187 mg/dL — ABNORMAL HIGH (ref 70–99)
Glucose-Capillary: 150 mg/dL — ABNORMAL HIGH (ref 70–99)
Glucose-Capillary: 122 mg/dL — ABNORMAL HIGH (ref 70–99)
Glucose-Capillary: 123 mg/dL — ABNORMAL HIGH (ref 70–99)

## 2022-09-05 LAB — BASIC METABOLIC PANEL
Anion gap: 10 (ref 5–15)
BUN: 15 mg/dL (ref 8–23)
CO2: 22 mmol/L (ref 22–32)
Calcium: 8.4 mg/dL — ABNORMAL LOW (ref 8.9–10.3)
Chloride: 104 mmol/L (ref 98–111)
Creatinine, Ser: 0.9 mg/dL (ref 0.61–1.24)
GFR, Estimated: >60 mL/min (ref >60)
Glucose, Bld: 169 mg/dL — ABNORMAL HIGH (ref 70–99)
Potassium: 4 mmol/L (ref 3.5–5.1)
Sodium: 136 mmol/L (ref 135–145)

## 2022-09-05 LAB — CBC WITH DIFFERENTIAL/PLATELET
Abs Immature Granulocytes: 0.02 10*3/uL (ref 0.00–0.07)
Basophils Absolute: 0.1 10*3/uL (ref 0.0–0.1)
Basophils Relative: 1 %
Eosinophils Absolute: 0.1 10*3/uL (ref 0.0–0.5)
Eosinophils Relative: 2 %
HCT: 44.4 % (ref 39.0–52.0)
Hemoglobin: 14.8 g/dL (ref 13.0–17.0)
Immature Granulocytes: 0 %
Lymphocytes Relative: 6 %
Lymphs Abs: 0.3 10*3/uL — ABNORMAL LOW (ref 0.7–4.0)
MCH: 30.5 pg (ref 26.0–34.0)
MCHC: 33.3 g/dL (ref 30.0–36.0)
MCV: 91.5 fL (ref 80.0–100.0)
Monocytes Absolute: 0.5 10*3/uL (ref 0.1–1.0)
Monocytes Relative: 7 %
Neutro Abs: 5.1 10*3/uL (ref 1.7–7.7)
Neutrophils Relative %: 84 %
Platelets: 182 10*3/uL (ref 150–400)
RBC: 4.85 MIL/uL (ref 4.22–5.81)
RDW: 13.8 % (ref 11.5–15.5)
WBC: 6.1 10*3/uL (ref 4.0–10.5)
nRBC: 0 % (ref 0.0–0.2)

## 2022-09-05 MED ORDER — LACTATED RINGERS IV SOLN: INTRAVENOUS | Status: DC

## 2022-09-05 NOTE — Consult Note (Addendum)
[place] Nurse ostomy consult note Ostomy pouch changed yesterday; intact with good seal. Pt had minimal amt stool in the past 24 hours and will not be discharged today.  Discussed plan of care with patient and wife. I will perform another pouch change and teaching session tomorrow morning.  Supplies at bedside for staff nurse's use.  Thank-you,  Julien Girt MSN, [place], [place], [place], [place]

## 2022-09-06 LAB — BASIC METABOLIC PANEL
Anion gap: 14 (ref 5–15)
BUN: 16 mg/dL (ref 8–23)
CO2: 16 mmol/L — ABNORMAL LOW (ref 22–32)
Calcium: 8.6 mg/dL — ABNORMAL LOW (ref 8.9–10.3)
Chloride: 103 mmol/L (ref 98–111)
Creatinine, Ser: 1.03 mg/dL (ref 0.61–1.24)
GFR, Estimated: >60 mL/min (ref >60)
Glucose, Bld: 119 mg/dL — ABNORMAL HIGH (ref 70–99)
Potassium: 4.3 mmol/L (ref 3.5–5.1)
Sodium: 133 mmol/L — ABNORMAL LOW (ref 135–145)

## 2022-09-06 LAB — CBC WITH DIFFERENTIAL/PLATELET
Abs Immature Granulocytes: 0.02 10*3/uL (ref 0.00–0.07)
Basophils Absolute: 0.1 10*3/uL (ref 0.0–0.1)
Basophils Relative: 1 %
Eosinophils Absolute: 0.2 10*3/uL (ref 0.0–0.5)
Eosinophils Relative: 4 %
HCT: 42.7 % (ref 39.0–52.0)
Hemoglobin: 14 g/dL (ref 13.0–17.0)
Immature Granulocytes: 0 %
Lymphocytes Relative: 9 %
Lymphs Abs: 0.5 10*3/uL — ABNORMAL LOW (ref 0.7–4.0)
MCH: 30.4 pg (ref 26.0–34.0)
MCHC: 32.8 g/dL (ref 30.0–36.0)
MCV: 92.6 fL (ref 80.0–100.0)
Monocytes Absolute: 0.4 10*3/uL (ref 0.1–1.0)
Monocytes Relative: 8 %
Neutro Abs: 4.2 10*3/uL (ref 1.7–7.7)
Neutrophils Relative %: 78 %
Platelets: 171 10*3/uL (ref 150–400)
RBC: 4.61 MIL/uL (ref 4.22–5.81)
RDW: 13.8 % (ref 11.5–15.5)
WBC: 5.4 10*3/uL (ref 4.0–10.5)
nRBC: 0 % (ref 0.0–0.2)

## 2022-09-06 LAB — GLUCOSE, CAPILLARY
Glucose-Capillary: 127 mg/dL — ABNORMAL HIGH (ref 70–99)
Glucose-Capillary: 191 mg/dL — ABNORMAL HIGH (ref 70–99)
Glucose-Capillary: 216 mg/dL — ABNORMAL HIGH (ref 70–99)
Glucose-Capillary: 172 mg/dL — ABNORMAL HIGH (ref 70–99)

## 2022-09-06 NOTE — Progress Notes (Signed)
Late entry from 3/12  Subjective/Chief Complaint: Bloating/distention and some nausea, ostomy output dropped significantly.  Objective: Vital signs in last 24 hours: Temp:  [98.1 F (36.7 C)-98.6 F (37 C)] 98.2 F (36.8 C) (03/13 1204) Pulse Rate:  [88-90] 88 (03/13 1204) Resp:  [16] 16 (03/13 1204) BP: (124-131)/(75-86) 131/84 (03/13 1204) SpO2:  [92 %-94 %] 94 % (03/13 1204) Last BM Date : 09/06/22  Intake/Output from previous day: 03/12 0701 - 03/13 0700 In: 2591.2 [P.O.:5; I.V.:2586.2] Out: 5445 [Urine:4175; Stool:1270] Intake/Output this shift: Total I/O In: -  Out: 1100 [Urine:800; Stool:300]  Alert, nontoxic Symm chest rise Soft, nontender, mild distention; ostomy pink and productive +scds  Lab Results:  Recent Labs    09/05/22 1030 09/06/22 0415  WBC 6.1 5.4  HGB 14.8 14.0  HCT 44.4 42.7  PLT 182 171   BMET Recent Labs    09/05/22 1030 09/06/22 0415  NA 136 133*  K 4.0 4.3  CL 104 103  CO2 22 16*  GLUCOSE 169* 119*  BUN 15 16  CREATININE 0.90 1.03  CALCIUM 8.4* 8.6*   PT/INR No results for input(s): "LABPROT", "INR" in the last 72 hours. ABG No results for input(s): "PHART", "HCO3" in the last 72 hours.  Invalid input(s): "PCO2", "PO2"  Studies/Results: No results found.  Anti-infectives: Anti-infectives (From admission, onward)    Start     Dose/Rate Route Frequency Ordered Stop   09/01/22 1400  neomycin (MYCIFRADIN) tablet 1,000 mg  Status:  Discontinued       See Hyperspace for full Linked Orders Report.   1,000 mg Oral 3 times per day 09/01/22 0534 09/01/22 0542   09/01/22 1400  metroNIDAZOLE (FLAGYL) tablet 1,000 mg  Status:  Discontinued       See Hyperspace for full Linked Orders Report.   1,000 mg Oral 3 times per day 09/01/22 0534 09/01/22 0542   09/01/22 0600  cefoTEtan (CEFOTAN) 2 g in sodium chloride 0.9 % 100 mL IVPB        2 g 200 mL/hr over 30 Minutes Intravenous On call to O.R. 09/01/22 0534 09/01/22 0810        Assessment/Plan: s/p Procedure(s): ROBOTIC ASSISTED LOWER ANTERIOR RESECTION AND INTRAOPERATIVE ASSESSMENT OF PERFUSION USING FIREFLY DYE (N/A) DIVERTING LOOP ILEOSTOMY (N/A) FLEXIBLE SIGMOIDOSCOPY (N/A) LAPAROSCOPIC LYSIS OF ADHESIONS  WBC normal, afebrile -May have ileus 2/2 imodium - hold, monitor, NPO/MIVF  Nadeen Landau, MD [facility], [place] [facility]   LOS: 5 days    Sharon [facility] 09/06/2022

## 2022-09-06 NOTE — Consult Note (Signed)
[place] Nurse ostomy consult note Teaching session performed with Pt using a hand-held mirror and his wife was at the bedside.  Stoma type/location: Stoma is red and viable, 1 1/4 inches, slightly above skin level.  Peristomal assessment: patchy areas of red scattered blisters to outer right tape boarder are beginning to dry and heal. Output: 300cc liquid brown stool Ostomy pouching: 1pc  Education provided:  Wife was able to apply pouch independently using barrier ring and one piece flexible convex pouch. They are both able to open and close to empty without assistance. Educational materials left at the bedside, and 5 sets of supplies to the room.  Use supplies: barrier ring, [place] # [id number], and convex Roselee Culver # [id number]. Reviewed pouching routines and ordering supplies. Pt asked appropriate questions.  Enrolled patient in [place] program: Yes, previously. Thank-you,  Julien Girt MSN, [place], [place], [place], [place]

## 2022-09-06 NOTE — Progress Notes (Signed)
5 Days Post-Op   Subjective/Chief Complaint: No n/v. Doing well. No complaints. Distention has resolved, ostomy working well now  Objective: Vital signs in last 24 hours: Temp:  [98.1 F (36.7 C)-98.9 F (37.2 C)] 98.2 F (36.8 C) (03/13 1204) Pulse Rate:  [88-90] 88 (03/13 1204) Resp:  [16] 16 (03/13 1204) BP: (124-131)/(75-86) 131/84 (03/13 1204) SpO2:  [92 %-94 %] 94 % (03/13 1204) Last BM Date : 09/06/22  Intake/Output from previous day: 03/12 0701 - 03/13 0700 In: 2591.2 [P.O.:5; I.V.:2586.2] Out: 5445 [Urine:4175; Stool:1270] Intake/Output this shift: Total I/O In: -  Out: 1100 [Urine:800; Stool:300]  Alert, nontoxic Symm chest rise Soft, nontender, nondistended; ostomy pink and productive Mild approp TTP; ostomy bag secure with liquid stool in bag +scds  Lab Results:  Recent Labs    09/05/22 1030 09/06/22 0415  WBC 6.1 5.4  HGB 14.8 14.0  HCT 44.4 42.7  PLT 182 171   BMET Recent Labs    09/05/22 1030 09/06/22 0415  NA 136 133*  K 4.0 4.3  CL 104 103  CO2 22 16*  GLUCOSE 169* 119*  BUN 15 16  CREATININE 0.90 1.03  CALCIUM 8.4* 8.6*   PT/INR No results for input(s): "LABPROT", "INR" in the last 72 hours. ABG No results for input(s): "PHART", "HCO3" in the last 72 hours.  Invalid input(s): "PCO2", "PO2"  Studies/Results: No results found.  Anti-infectives: Anti-infectives (From admission, onward)    Start     Dose/Rate Route Frequency Ordered Stop   09/01/22 1400  neomycin (MYCIFRADIN) tablet 1,000 mg  Status:  Discontinued       See Hyperspace for full Linked Orders Report.   1,000 mg Oral 3 times per day 09/01/22 0534 09/01/22 0542   09/01/22 1400  metroNIDAZOLE (FLAGYL) tablet 1,000 mg  Status:  Discontinued       See Hyperspace for full Linked Orders Report.   1,000 mg Oral 3 times per day 09/01/22 0534 09/01/22 0542   09/01/22 0600  cefoTEtan (CEFOTAN) 2 g in sodium chloride 0.9 % 100 mL IVPB        2 g 200 mL/hr over 30 Minutes  Intravenous On call to O.R. 09/01/22 0534 09/01/22 0810       Assessment/Plan: s/p Procedure(s): ROBOTIC ASSISTED LOWER ANTERIOR RESECTION AND INTRAOPERATIVE ASSESSMENT OF PERFUSION USING FIREFLY DYE (N/A) DIVERTING LOOP ILEOSTOMY (N/A) FLEXIBLE SIGMOIDOSCOPY (N/A) LAPAROSCOPIC LYSIS OF ADHESIONS  Ambulate, pulm toilet FEN - lytes improved. Monitory lytes given ileostomy output; KVO. Discussed keeping up with ostomy output with oral intake - water, pedialyte, G2 gatorade Output on ileostomy picked back up - resume soft diet today Ostomy teaching ; he feels well DM2 - cont SSI, on some home meds; BS better past 24 hrs consult diabetes nurse  Nadeen Landau, MD [facility], [place] [facility]   LOS: 5 days    Sharon [facility] 09/06/2022

## 2022-09-07 ENCOUNTER — Other Ambulatory Visit (HOSPITAL_COMMUNITY): Payer: Self-pay

## 2022-09-07 LAB — GLUCOSE, CAPILLARY: Glucose-Capillary: 164 mg/dL — ABNORMAL HIGH (ref 70–99)

## 2022-09-07 MED ORDER — LOPERAMIDE HCL 2 MG PO TABS
2.0000 mg | ORAL_TABLET | Freq: Two times a day (BID) | ORAL | 3 refills | Status: DC | PRN
  Filled 2022-09-07: qty 30, 15d supply, fill #0

## 2022-09-07 MED ORDER — HEPARIN SOD (PORK) LOCK FLUSH 100 UNIT/ML IV SOLN
500.0000 [IU] | INTRAVENOUS | Status: AC | PRN
  Administered 2022-09-07: 500 [IU]

## 2022-09-07 NOTE — Progress Notes (Signed)
6 Days Post-Op   Subjective/Chief Complaint: No n/v. Doing well. No complaints. Distention has resolved, ostomy working well  Objective: Vital signs in last 24 hours: Temp:  [98.2 F (36.8 C)-98.6 F (37 C)] 98.3 F (36.8 C) (03/14 0510) Pulse Rate:  [85-92] 85 (03/14 0510) Resp:  [16-18] 18 (03/14 0510) BP: (128-139)/(82-92) 128/92 (03/14 0510) SpO2:  [94 %-98 %] 98 % (03/14 0510) Weight:  [109 kg] 109 kg (03/14 0500) Last BM Date : 09/07/22  Intake/Output from previous day: 03/13 0701 - 03/14 0700 In: 2030 [P.O.:1230] Out: 2100 [Urine:800; Stool:1300] Intake/Output this shift: Total I/O In: -  Out: 250 [Stool:250]  Alert, nontoxic Symm chest rise Soft, nontender, nondistended; ostomy pink and productive Mild approp TTP; ostomy bag secure with liquid stool in bag +scds  Lab Results:  Recent Labs    09/05/22 1030 09/06/22 0415  WBC 6.1 5.4  HGB 14.8 14.0  HCT 44.4 42.7  PLT 182 171   BMET Recent Labs    09/05/22 1030 09/06/22 0415  NA 136 133*  K 4.0 4.3  CL 104 103  CO2 22 16*  GLUCOSE 169* 119*  BUN 15 16  CREATININE 0.90 1.03  CALCIUM 8.4* 8.6*   PT/INR No results for input(s): "LABPROT", "INR" in the last 72 hours. ABG No results for input(s): "PHART", "HCO3" in the last 72 hours.  Invalid input(s): "PCO2", "PO2"  Studies/Results: No results found.  Anti-infectives: Anti-infectives (From admission, onward)    Start     Dose/Rate Route Frequency Ordered Stop   09/01/22 1400  neomycin (MYCIFRADIN) tablet 1,000 mg  Status:  Discontinued       See Hyperspace for full Linked Orders Report.   1,000 mg Oral 3 times per day 09/01/22 0534 09/01/22 0542   09/01/22 1400  metroNIDAZOLE (FLAGYL) tablet 1,000 mg  Status:  Discontinued       See Hyperspace for full Linked Orders Report.   1,000 mg Oral 3 times per day 09/01/22 0534 09/01/22 0542   09/01/22 0600  cefoTEtan (CEFOTAN) 2 g in sodium chloride 0.9 % 100 mL IVPB        2 g 200 mL/hr over  30 Minutes Intravenous On call to O.R. 09/01/22 0534 09/01/22 0810       Assessment/Plan: s/p Procedure(s): ROBOTIC ASSISTED LOWER ANTERIOR RESECTION AND INTRAOPERATIVE ASSESSMENT OF PERFUSION USING FIREFLY DYE (N/A) DIVERTING LOOP ILEOSTOMY (N/A) FLEXIBLE SIGMOIDOSCOPY (N/A) LAPAROSCOPIC LYSIS OF ADHESIONS  Ambulate, pulm toilet FEN - lytes improved. Monitory lytes given ileostomy output; KVO. Discussed keeping up with ostomy output with oral intake - water, pedialyte, G2 gatorade Output on ileostomy ~controlled - was 1.3L/24 hr but 400 in last 12 hrs Ostomy teaching ; he feels well DM2 - home meds; diabetes nurse  Nadeen Landau, MD [facility] Surgery, [place] [facility]   LOS: 6 days    Sharon [facility] 09/07/2022

## 2022-09-07 NOTE — Progress Notes (Signed)
Patient was given discharge instructions, and all questions were answered.  Patient was stable for discharge and was taken to the main exit by wheelchair. 

## 2022-09-08 ENCOUNTER — Telehealth: Payer: Self-pay

## 2022-09-08 NOTE — Transitions of Care (Post Inpatient/ED Visit) (Signed)
   09/08/2022  Name: [name] [name] MRN: [id number] DOB: 12/25/1960  Today's TOC FU Call Status: Today's TOC FU Call Status:: Successful TOC FU Call Competed TOC FU Call Complete Date: 09/08/22  Transition Care Management Follow-up Telephone Call Date of Discharge: 09/07/22 Discharge Facility: [facility]) Type of Discharge: Inpatient Admission Primary Inpatient Discharge Diagnosis:: malignant neoplasm of rectum How have you been since you were released from the hospital?: Better Any questions or concerns?: No  Items Reviewed: Did you receive and understand the discharge instructions provided?: Yes Medications obtained and verified?: Yes (Medications Reviewed) Any new allergies since your discharge?: No Dietary orders reviewed?: Yes Do you have support at home?: Yes People in Home: spouse  Home Care and Equipment/Supplies: [place] Ordered?: Yes Name of [place]:: [place] set up a time to come to your home?: No EMR reviewed for [place] Orders:  (patient declined) Any new equipment or medical supplies ordered?: NA  Functional Questionnaire: Do you need assistance with bathing/showering or dressing?: Yes Do you need assistance with meal preparation?: No Do you need assistance with eating?: No Do you have difficulty maintaining continence: No Do you need assistance with getting out of bed/getting out of a chair/moving?: No Do you have difficulty managing or taking your medications?: No  Folllow up appointments reviewed: PCP Follow-up appointment confirmed?: [facility] Follow-up appointment confirmed?: Yes Date of Specialist follow-up appointment?: 10/02/22 Follow-Up Specialty Provider:: Dr Dema Severin Do you need transportation to your follow-up appointment?: No Do you understand care options if your condition(s) worsen?: Yes-patient verbalized understanding    [place]fred, [place] Nurse Health  Advisor Direct Dial [phone]

## 2022-09-11 NOTE — Discharge Summary (Signed)
Patient ID: [name] [name] MRN: [id number] DOB/AGE: [birth year] 63 y.o.  Admit date: 09/01/2022 Discharge date: 09/11/2022  Discharge Diagnoses Patient Active Problem List   Diagnosis Date Noted   S/P laparoscopic-assisted sigmoidectomy 09/01/2022   S/P partial colectomy 09/01/2022   Rectal adenocarcinoma metastatic to liver ([place]) 03/10/2022   Genetic testing 12/02/2021   Family history of breast cancer 11/09/2021   Rectal cancer ([place]) 11/09/2021   Chronic pain 03/10/2021   Lumbar spondylosis 04/02/2019   Degeneration of lumbar intervertebral disc 08/07/2018   Chronic low back pain 07/13/2017   Benign prostatic hyperplasia with incomplete bladder emptying 02/16/2017   Family history of colon cancer 02/16/2017   Dyslipidemia 123456   Metabolic syndrome [id number]   Obese 03/18/2013   Asthma 09/13/2011   Allergic rhinitis 05/24/2011   GERD 01/12/2010   COLONIC POLYPS, HX OF 01/12/2010    Consultants none  Procedures OR 08/31/21 -  Robotic assisted low anterior resection with colorectal anastomosis, diverting loop ileostomy Diagnostic flexible sigmoidoscopy (necessary to confirm location of lesion, margin) Robotic lysis of adhesions x 60 minutes Intraoperative assessment of perfusion using ICG fluorescence imaging Bilateral transversus abdominus plane (TAP) blocks  Hospital Course: He was admitted postoperatively and recovered well.  He did have a bit of the presumed not unexpected postoperative ileus that was self-limited lasting 1 to 2 days and resolved on its own.  On the day of discharge, he had completed multiple ostomy teaching sessions and felt comfortable taking care of this.  His ostomy output was well-controlled.  He understood things to watch out for including outputs greater than 1.2 L in 24 hours.  Follow-up in our office has been arranged.  Final surgical pathology  A. RECTOSIGMOID COLON, LOW ANTERIOR RESECTION:  Invasive moderately differentiated  adenocarcinoma with focal mucinous  features focally invading through muscularis propria (ypT3)  Tumor measures 3.0 x 2.2 x 0.5 cm  Margins free  Two of twelve perirectal lymph nodes with metastatic carcinoma (2/11,  ypT1b)  Two tumor deposits (tumor deposit 0.25 cm from radial margin)  Changes compatible with neoadjuvant therapy   B. FINAL DISTAL MARGIN, EXCISION:  Benign colonic mucosa  Negative for carcinoma    Allergies as of 09/07/2022       Reactions   Other Swelling   Hayfever,GRASS,DOGS,CATS        Medication List     STOP taking these medications    diphenoxylate-atropine 2.5-0.025 MG tablet Commonly known as: LOMOTIL       TAKE these medications    albuterol 108 (90 Base) MCG/ACT inhaler Commonly known as: VENTOLIN HFA Inhale 2 puffs into the lungs every 6 (six) hours as needed for wheezing or shortness of breath.   empagliflozin 25 MG Tabs tablet Commonly known as: Jardiance Take 1 tablet (25 mg total) by mouth daily before breakfast.   fluticasone 50 MCG/ACT nasal spray Commonly known as: FLONASE Place 2 sprays into both nostrils daily.   FreeStyle Libre 3 Sensor Misc Place 1 sensor on the skin every 14 days. Use to check glucose continuously.  DX: E11.65   insulin lispro 100 UNIT/ML KwikPen Commonly known as: HumaLOG KwikPen Inject 8 Units into the skin 3 (three) times daily.   Insulin Pen Needle 29G X 5MM Misc 1 Application by Does not apply route at bedtime.   levocetirizine 5 MG tablet Commonly known as: XYZAL Take 1 tablet (5 mg total) by mouth every evening.   loperamide 2 MG tablet Commonly known as: Imodium A-D Take 1 tablet (2  mg total) by mouth 2 (two) times daily as needed for diarrhea or loose stools (Ostomy output >1.2L (1,200 mL) in 24 hrs).   LORazepam 0.5 MG tablet Commonly known as: ATIVAN Take 1 tablet (0.5 mg total) by mouth every 8 (eight) hours as needed for anxiety.   magic mouthwash Soln Take 5 mLs by mouth 4  (four) times daily as needed for mouth pain (swish and spit).   metFORMIN 500 MG tablet Commonly known as: GLUCOPHAGE Take 1 tablet (500 mg total) by mouth 2 (two) times daily with a meal.   montelukast 10 MG tablet Commonly known as: SINGULAIR Take 1 tablet (10 mg total) by mouth at bedtime.   omeprazole 40 MG capsule Commonly known as: PRILOSEC Take 1 capsule (40 mg total) by mouth daily as needed. For acid reflux What changed: when to take this   ondansetron 8 MG tablet Commonly known as: ZOFRAN Take 1 tablet (8 mg total) by mouth every 8 (eight) hours as needed for nausea or vomiting.   oxyCODONE 5 MG immediate release tablet Commonly known as: Roxicodone Take 1 tablet (5 mg total) by mouth every 6 (six) hours as needed for up to 5 days (postop pain not controlled with tylenol/ibuprofen first).   oxyCODONE-acetaminophen 5-325 MG tablet Commonly known as: PERCOCET/ROXICET Take 1 tablet by mouth every 6 (six) hours as needed for severe pain.   rosuvastatin 10 MG tablet Commonly known as: Crestor Take 1 tablet (10 mg total) by mouth daily.   tamsulosin 0.4 MG Caps capsule Commonly known as: FLOMAX Take 2 capsules (0.8 mg total) by mouth daily.   Toujeo SoloStar 300 UNIT/ML Solostar Pen Generic drug: insulin glargine (1 Unit Dial) Inject 30 Units into the skin at bedtime.   Trulicity 1.5 0000000 Sopn Generic drug: Dulaglutide Inject 1.5 mg into the skin once a week.          Follow-up Information     Ileana Roup, MD Follow up.   Specialties: General Surgery, Colon and Rectal Surgery Contact information: [place] [address] [phone]         [place]. Schedule an appointment as soon as possible for a visit.          [place] ([facility]) Follow up.   Why: to provide home nursing visits Contact information: [place]. Dema Severin,  M.D. [facility]

## 2022-09-13 ENCOUNTER — Other Ambulatory Visit: Payer: Self-pay

## 2022-09-13 NOTE — Progress Notes (Signed)
The proposed treatment discussed in conference is for discussion purpose only and is not a binding recommendation.  The patients have not been physically examined, or presented with their treatment options.  Therefore, final treatment plans cannot be decided.  

## 2022-09-19 ENCOUNTER — Ambulatory Visit (HOSPITAL_COMMUNITY)
Admission: RE | Admit: 2022-09-19 | Discharge: 2022-09-19 | Disposition: A | Discharge status: Home / Self Care | Payer: Commercial Managed Care - PPO | Source: Ambulatory Visit | Attending: Surgery | Admitting: Surgery

## 2022-09-19 DIAGNOSIS — Z932 Ileostomy status: Secondary | ICD-10-CM

## 2022-09-19 DIAGNOSIS — L24B3 Irritant contact dermatitis related to fecal or urinary stoma or fistula: Secondary | ICD-10-CM | POA: Diagnosis not present

## 2022-09-19 NOTE — Progress Notes (Signed)
[facility]   Reason for visit:  RLQ loop ileostomy Contact dermatitis HPI:  Rectal adenocarcinoma Past Medical History:  Diagnosis Date   Allergy    SEASONAL   Arthritis    Asthma    only due to hey fever   Blood in stool    Cancer ([place]) 10/2021   rectal cancer   Diabetes mellitus without complication ([place]) 99991111   type 2   Family history of breast cancer    Family history of colon cancer    GERD (gastroesophageal reflux disease)    Hay fever    History of back pain    Hx of colonic polyps    Migraines    PONV (postoperative nausea and vomiting)    also history of motion sickness   Family History  Problem Relation Age of Onset   Diabetes Mother    Breast cancer Mother 57   Diabetes Father        type II   Colon polyps Sister        11+ polyps   Other Brother        reportedly negative genetic testing   Diabetes Brother    Colon cancer Maternal Aunt    Colon cancer Maternal Uncle        x2   Colon cancer Maternal Uncle        x3   Colon cancer Maternal Uncle    Colon cancer Maternal Uncle    Colon cancer Maternal Uncle    Colon cancer Maternal Uncle    Other Maternal Uncle        farm accident as a teen   Colon cancer Maternal Grandmother    Cancer Maternal Grandmother        breast, colon    Diabetes Paternal Grandfather    Colon cancer Cousin        <50   Colon cancer Cousin        maternal cousin's children   Esophageal cancer Neg Hx    Rectal cancer Neg Hx    Stomach cancer Neg Hx    Allergies  Allergen Reactions   Other Swelling    Hayfever,GRASS,DOGS,CATS   Current Outpatient Medications  Medication Sig Dispense Refill Last Dose   albuterol (VENTOLIN HFA) 108 (90 Base) MCG/ACT inhaler Inhale 2 puffs into the lungs every 6 (six) hours as needed for wheezing or shortness of breath. (Patient not taking: Reported on 03/29/2022) 18 g 2    Continuous Blood Gluc Sensor (FREESTYLE LIBRE 3 SENSOR) MISC Place 1 sensor on the skin  every 14 days. Use to check glucose continuously.  DX: E11.65 2 each 11    Dulaglutide (TRULICITY) 1.5 0000000 SOPN Inject 1.5 mg into the skin once a week. 6 mL 2    empagliflozin (JARDIANCE) 25 MG TABS tablet Take 1 tablet (25 mg total) by mouth daily before breakfast. 90 tablet 1    fluticasone (FLONASE) 50 MCG/ACT nasal spray Place 2 sprays into both nostrils daily. 48 g 1    insulin glargine, 1 Unit Dial, (TOUJEO SOLOSTAR) 300 UNIT/ML Solostar Pen Inject 30 Units into the skin at bedtime. (Patient not taking: Reported on 09/21/2022) 3 mL 2    insulin lispro (HUMALOG KWIKPEN) 100 UNIT/ML KwikPen Inject 8 Units into the skin 3 (three) times daily. (Patient not taking: Reported on 09/21/2022) 6 mL 1    Insulin Pen Needle 29G X 5MM MISC 1 Application by Does not apply route at bedtime. 100 each 2  levocetirizine (XYZAL) 5 MG tablet Take 1 tablet (5 mg total) by mouth every evening. 90 tablet 1    loperamide (IMODIUM A-D) 2 MG tablet Take 1 tablet (2 mg total) by mouth 2 (two) times daily as needed for diarrhea or loose stools (Ostomy output >1.2L (1,200 mL) in 24 hrs). 30 tablet 3    LORazepam (ATIVAN) 0.5 MG tablet Take 1 tablet (0.5 mg total) by mouth every 8 (eight) hours as needed for anxiety. 40 tablet 0    magic mouthwash SOLN Take 5 mLs by mouth 4 (four) times daily as needed for mouth pain (swish and spit). (Patient not taking: Reported on 09/21/2022) 240 mL 1    metFORMIN (GLUCOPHAGE) 500 MG tablet Take 1 tablet (500 mg total) by mouth 2 (two) times daily with a meal. 180 tablet 1    montelukast (SINGULAIR) 10 MG tablet Take 1 tablet (10 mg total) by mouth at bedtime. 90 tablet 1    naproxen sodium (ALEVE) 220 MG tablet Take 220 mg by mouth 2 (two) times daily.      omeprazole (PRILOSEC) 40 MG capsule Take 1 capsule (40 mg total) by mouth daily as needed. For acid reflux (Patient taking differently: Take 40 mg by mouth daily. For acid reflux) 90 capsule 1    ondansetron (ZOFRAN) 8 MG tablet  Take 1 tablet (8 mg total) by mouth every 8 (eight) hours as needed for nausea or vomiting. 30 tablet 3    oxyCODONE-acetaminophen (PERCOCET/ROXICET) 5-325 MG tablet Take 1 tablet by mouth every 6 (six) hours as needed for severe pain. 30 tablet 0    rosuvastatin (CRESTOR) 10 MG tablet Take 1 tablet (10 mg total) by mouth daily. 90 tablet 1    tamsulosin (FLOMAX) 0.4 MG CAPS capsule Take 2 capsules (0.8 mg total) by mouth daily. 180 capsule 0    No current facility-administered medications for this encounter.   ROS  Review of Systems  Constitutional:  Positive for appetite change and unexpected weight change.       Decreased appetite  Gastrointestinal:        RLQ Ileostomy  Skin:  Positive for rash.  Psychiatric/Behavioral: Negative.    All other systems reviewed and are negative.  Vital signs:  BP 125/84   Pulse 92   Temp 97.9 F (36.6 C)   Resp 19   SpO2 97%  Exam:  Physical Exam Vitals reviewed.  Constitutional:      Appearance: Normal appearance.  Abdominal:     Palpations: Abdomen is soft.  Skin:    General: Skin is warm and dry.     Findings: Rash present.     Comments: Peristomal breakdown  Neurological:     Mental Status: He is alert and oriented to person, place, and time.  Psychiatric:        Mood and Affect: Mood normal.        Behavior: Behavior normal.        Thought Content: Thought content normal.        Judgment: Judgment normal.     Stoma type/location:  RLQ ileostomy Stomal assessment/size:  1 1/4" budded pink and moist Peristomal assessment:  erythema and weeping skin to peristomal area.   Treatment options for stomal/peristomal skin: stoma powder and skin prep  Barrier ring and 1 piece convex pouch.  Added ostomy belt today.  He plans to order [employer] on [employer]: soft brown stool Ostomy pouching: 1pc. convex Education provided:  Discussed hydration at length  as well as blockage.  He feels he has delayed output and then large amount of  stool.  We discuss replacing electrolytes, drinking sufficient water and monitor for dry mouth, dizziness, concentrated urine.  He denies any of that today. Wife is present and has been assisting with care and monitoring intake.     Impression/dx  Ileostomy Contact dermatitis Discussion  See above, switch to 1 piece convex due to nearly flush stoma and leaking Plan  See back 2 weeks.    Visit time: 50 minutes.   Domenic Moras FNP-BC

## 2022-09-21 ENCOUNTER — Inpatient Hospital Stay: Payer: Commercial Managed Care - PPO | Attending: Oncology | Admitting: Oncology

## 2022-09-21 VITALS — BP 120/82 | HR 88 | Temp 98.1°F | Resp 18 | Ht 72.0 in | Wt 239.4 lb

## 2022-09-21 DIAGNOSIS — C20 Malignant neoplasm of rectum: Secondary | ICD-10-CM

## 2022-09-21 DIAGNOSIS — C19 Malignant neoplasm of rectosigmoid junction: Secondary | ICD-10-CM | POA: Diagnosis present

## 2022-09-21 DIAGNOSIS — R2241 Localized swelling, mass and lump, right lower limb: Secondary | ICD-10-CM | POA: Insufficient documentation

## 2022-09-21 DIAGNOSIS — Z932 Ileostomy status: Secondary | ICD-10-CM | POA: Insufficient documentation

## 2022-09-21 DIAGNOSIS — L24B3 Irritant contact dermatitis related to fecal or urinary stoma or fistula: Secondary | ICD-10-CM | POA: Insufficient documentation
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
## 2022-09-21 NOTE — Discharge Instructions (Signed)
Switched to 1 piece convex Barrier ring Stoma powder and skin prep Ostomy belt Considering stealth belt

## 2022-09-28 ENCOUNTER — Other Ambulatory Visit: Payer: Self-pay | Admitting: *Deleted

## 2022-09-28 DIAGNOSIS — J301 Allergic rhinitis due to pollen: Secondary | ICD-10-CM

## 2022-09-28 DIAGNOSIS — N401 Enlarged prostate with lower urinary tract symptoms: Secondary | ICD-10-CM

## 2022-09-28 MED ORDER — TAMSULOSIN HCL 0.4 MG PO CAPS: 0.8000 mg | ORAL_CAPSULE | Freq: Every day | ORAL | 0 refills | Status: DC

## 2022-09-28 MED ORDER — MONTELUKAST SODIUM 10 MG PO TABS: 10.0000 mg | ORAL_TABLET | Freq: Every day | ORAL | 1 refills | Status: DC

## 2022-10-03 ENCOUNTER — Ambulatory Visit (HOSPITAL_COMMUNITY)
Admission: RE | Admit: 2022-10-03 | Discharge: 2022-10-03 | Disposition: A | Discharge status: Home / Self Care | Payer: Commercial Managed Care - PPO | Source: Ambulatory Visit | Attending: Nurse Practitioner | Admitting: Nurse Practitioner

## 2022-10-03 DIAGNOSIS — L24B3 Irritant contact dermatitis related to fecal or urinary stoma or fistula: Secondary | ICD-10-CM

## 2022-10-03 DIAGNOSIS — Z432 Encounter for attention to ileostomy: Secondary | ICD-10-CM | POA: Diagnosis present

## 2022-10-03 DIAGNOSIS — Z932 Ileostomy status: Secondary | ICD-10-CM | POA: Diagnosis not present

## 2022-10-03 NOTE — Progress Notes (Signed)
[facility]   Reason for visit:  RLQ ileostomy, flush HPI:  Rectal cancer with resection and ileostomy Past Medical History:  Diagnosis Date   Allergy    SEASONAL   Arthritis    Asthma    only due to hey fever   Blood in stool    Cancer ([facility]) 10/2021   rectal cancer   Diabetes mellitus without complication ([facility]) 08/25/2014   type 2   Family history of breast cancer    Family history of colon cancer    GERD (gastroesophageal reflux disease)    Hay fever    History of back pain    Hx of colonic polyps    Migraines    PONV (postoperative nausea and vomiting)    also history of motion sickness   Family History  Problem Relation Age of Onset   Diabetes Mother    Breast cancer Mother 34   Diabetes Father        type II   Colon polyps Sister        11+ polyps   Other Brother        reportedly negative genetic testing   Diabetes Brother    Colon cancer Maternal Aunt    Colon cancer Maternal Uncle        x2   Colon cancer Maternal Uncle        x3   Colon cancer Maternal Uncle    Colon cancer Maternal Uncle    Colon cancer Maternal Uncle    Colon cancer Maternal Uncle    Other Maternal Uncle        farm accident as a teen   Colon cancer Maternal Grandmother    Cancer Maternal Grandmother        breast, colon    Diabetes Paternal Grandfather    Colon cancer Cousin        <50   Colon cancer Cousin        maternal cousin's children   Esophageal cancer Neg Hx    Rectal cancer Neg Hx    Stomach cancer Neg Hx    Allergies  Allergen Reactions   Other Swelling    Hayfever,GRASS,DOGS,CATS   Current Outpatient Medications  Medication Sig Dispense Refill Last Dose   albuterol (VENTOLIN HFA) 108 (90 Base) MCG/ACT inhaler Inhale 2 puffs into the lungs every 6 (six) hours as needed for wheezing or shortness of breath. (Patient not taking: Reported on 03/29/2022) 18 g 2    Continuous Blood Gluc Sensor (FREESTYLE LIBRE 3 SENSOR) MISC Place 1 sensor on the skin  every 14 days. Use to check glucose continuously.  DX: E11.65 2 each 11    Dulaglutide (TRULICITY) 1.5 MG/0.5ML SOPN Inject 1.5 mg into the skin once a week. 6 mL 2    empagliflozin (JARDIANCE) 25 MG TABS tablet Take 1 tablet (25 mg total) by mouth daily before breakfast. 90 tablet 1    fluticasone (FLONASE) 50 MCG/ACT nasal spray Place 2 sprays into both nostrils daily. 48 g 1    insulin glargine, 1 Unit Dial, (TOUJEO SOLOSTAR) 300 UNIT/ML Solostar Pen Inject 30 Units into the skin at bedtime. (Patient not taking: Reported on 09/21/2022) 3 mL 2    insulin lispro (HUMALOG KWIKPEN) 100 UNIT/ML KwikPen Inject 8 Units into the skin 3 (three) times daily. (Patient not taking: Reported on 09/21/2022) 6 mL 1    Insulin Pen Needle 29G X MISC 1 Application by Does not apply route at bedtime. 100  each 2    levocetirizine (XYZAL) 5 MG tablet Take 1 tablet (5 mg total) by mouth every evening. 90 tablet 1    loperamide (IMODIUM A-D) 2 MG tablet Take 1 tablet (2 mg total) by mouth 2 (two) times daily as needed for diarrhea or loose stools (Ostomy output >1.2L (1,200 mL) in 24 hrs). 30 tablet 3    LORazepam (ATIVAN) 0.5 MG tablet Take 1 tablet (0.5 mg total) by mouth every 8 (eight) hours as needed for anxiety. 40 tablet 0    magic mouthwash SOLN Take 5 mLs by mouth 4 (four) times daily as needed for mouth pain (swish and spit). (Patient not taking: Reported on 09/21/2022) 240 mL 1    metFORMIN (GLUCOPHAGE) 500 MG tablet Take 1 tablet (500 mg total) by mouth 2 (two) times daily with a meal. 180 tablet 1    montelukast (SINGULAIR) 10 MG tablet Take 1 tablet (10 mg total) by mouth at bedtime. 90 tablet 1    naproxen sodium (ALEVE) 220 MG tablet Take 220 mg by mouth 2 (two) times daily.      omeprazole (PRILOSEC) 40 MG capsule Take 1 capsule (40 mg total) by mouth daily as needed. For acid reflux (Patient taking differently: Take 40 mg by mouth daily. For acid reflux) 90 capsule 1    ondansetron (ZOFRAN) 8 MG tablet  Take 1 tablet (8 mg total) by mouth every 8 (eight) hours as needed for nausea or vomiting. 30 tablet 3    oxyCODONE-acetaminophen (PERCOCET/ROXICET) 5-325 MG tablet Take 1 tablet by mouth every 6 (six) hours as needed for severe pain. 30 tablet 0    rosuvastatin (CRESTOR) 10 MG tablet Take 1 tablet (10 mg total) by mouth daily. 90 tablet 1    tamsulosin (FLOMAX) 0.4 MG CAPS capsule Take 2 capsules (0.8 mg total) by mouth daily. 180 capsule 0    No current facility-administered medications for this encounter.   ROS  Review of Systems  Gastrointestinal:        RLQ ileostomy  Skin:  Positive for rash and wound.       Peristomal breakdown  All other systems reviewed and are negative.  Vital signs:  BP 132/81 (BP Location: Right Arm)   Pulse 98   Temp 98.2 F (36.8 C) (Oral)   Resp 18   SpO2 98%  Exam:  Physical Exam Vitals reviewed.  Constitutional:      Appearance: Normal appearance.  Abdominal:     Palpations: Abdomen is soft.     Comments: RLQ ileostomy  Neurological:     Mental Status: He is alert.     Stoma type/location:  RLQ ileostomy Stomal assessment/size:  1 1/4" flush stoma with breakdown at 12 o'clock. Skin creases here and causes moisture to get trapped.  Peristomal assessment:  erythema and denuded skin at 12 o'clock.  Wife to practice pouch change.  I demonstrate holding the skin taut and applying barrier ring.  NExt, we apply a 1piece convex pouch and recommend wearing belt.  Treatment options for stomal/peristomal skin: barrier ring and 1 piece convex coloplast pouch Output: soft brown stool Ostomy pouching: 1pc.convex with barrier ring  Education provided:  set up with [facility].     Impression/dx  ileostomy Discussion  Wife performs pouch change  uses deodorant, adhesive remover, stoma powder and skin prep with barrier ring and convex pouch Plan   See back as needed.  Set up for supplies    Visit time: 45 minutes.  Domenic Moras FNP-BC

## 2022-10-03 NOTE — Discharge Instructions (Signed)
Will set up with [facility]  [phone]

## 2022-10-04 ENCOUNTER — Encounter (HOSPITAL_COMMUNITY): Payer: Self-pay | Admitting: Surgery

## 2022-10-05 ENCOUNTER — Encounter (HOSPITAL_COMMUNITY): Payer: Self-pay | Admitting: Surgery

## 2022-10-06 ENCOUNTER — Encounter (HOSPITAL_COMMUNITY)
Admission: RE | Disposition: A | Discharge status: Home / Self Care | Payer: Self-pay | Source: Home / Self Care | Attending: Surgery

## 2022-10-06 ENCOUNTER — Ambulatory Visit (HOSPITAL_COMMUNITY): Payer: Commercial Managed Care - PPO | Admitting: Anesthesiology

## 2022-10-06 ENCOUNTER — Other Ambulatory Visit: Payer: Self-pay

## 2022-10-06 ENCOUNTER — Other Ambulatory Visit (HOSPITAL_COMMUNITY): Payer: Self-pay | Admitting: Surgery

## 2022-10-06 ENCOUNTER — Encounter (HOSPITAL_COMMUNITY): Payer: Self-pay | Admitting: Surgery

## 2022-10-06 ENCOUNTER — Ambulatory Visit (HOSPITAL_BASED_OUTPATIENT_CLINIC_OR_DEPARTMENT_OTHER): Payer: Commercial Managed Care - PPO | Admitting: Anesthesiology

## 2022-10-06 ENCOUNTER — Ambulatory Visit (HOSPITAL_COMMUNITY)
Admission: RE | Admit: 2022-10-06 | Discharge: 2022-10-06 | Disposition: A | Discharge status: Home / Self Care | Payer: Commercial Managed Care - PPO | Attending: Surgery | Admitting: Surgery

## 2022-10-06 DIAGNOSIS — K5730 Diverticulosis of large intestine without perforation or abscess without bleeding: Secondary | ICD-10-CM | POA: Insufficient documentation

## 2022-10-06 DIAGNOSIS — Z7984 Long term (current) use of oral hypoglycemic drugs: Secondary | ICD-10-CM | POA: Diagnosis not present

## 2022-10-06 DIAGNOSIS — Z87891 Personal history of nicotine dependence: Secondary | ICD-10-CM | POA: Diagnosis not present

## 2022-10-06 DIAGNOSIS — Z01818 Encounter for other preprocedural examination: Secondary | ICD-10-CM | POA: Insufficient documentation

## 2022-10-06 DIAGNOSIS — Z980 Intestinal bypass and anastomosis status: Secondary | ICD-10-CM | POA: Insufficient documentation

## 2022-10-06 DIAGNOSIS — Z85048 Personal history of other malignant neoplasm of rectum, rectosigmoid junction, and anus: Secondary | ICD-10-CM | POA: Insufficient documentation

## 2022-10-06 DIAGNOSIS — Z9221 Personal history of antineoplastic chemotherapy: Secondary | ICD-10-CM | POA: Diagnosis not present

## 2022-10-06 DIAGNOSIS — J45909 Unspecified asthma, uncomplicated: Secondary | ICD-10-CM | POA: Diagnosis not present

## 2022-10-06 DIAGNOSIS — Z800 Family history of malignant neoplasm of digestive organs: Secondary | ICD-10-CM | POA: Diagnosis not present

## 2022-10-06 DIAGNOSIS — E119 Type 2 diabetes mellitus without complications: Secondary | ICD-10-CM

## 2022-10-06 DIAGNOSIS — Z8589 Personal history of malignant neoplasm of other organs and systems: Secondary | ICD-10-CM | POA: Insufficient documentation

## 2022-10-06 DIAGNOSIS — C20 Malignant neoplasm of rectum: Secondary | ICD-10-CM

## 2022-10-06 DIAGNOSIS — Z923 Personal history of irradiation: Secondary | ICD-10-CM | POA: Diagnosis not present

## 2022-10-06 HISTORY — PX: FLEXIBLE SIGMOIDOSCOPY: SHX5431

## 2022-10-06 LAB — GLUCOSE, CAPILLARY: Glucose-Capillary: 142 mg/dL — ABNORMAL HIGH (ref 70–99)

## 2022-10-06 SURGERY — SIGMOIDOSCOPY, FLEXIBLE
Anesthesia: Monitor Anesthesia Care

## 2022-10-06 MED ORDER — LACTATED RINGERS IV SOLN
INTRAVENOUS | Status: DC
  Administered 2022-10-06: 1000 mL via INTRAVENOUS

## 2022-10-06 MED ORDER — PROPOFOL 10 MG/ML IV BOLUS
INTRAVENOUS | Status: AC
  Filled 2022-10-06: qty 20

## 2022-10-06 MED ORDER — PROPOFOL 10 MG/ML IV BOLUS
INTRAVENOUS | Status: DC | PRN
  Administered 2022-10-06 (×3): 50 mg via INTRAVENOUS

## 2022-10-06 NOTE — Op Note (Signed)
[facility] Patient Name: [name] [name] Procedure Date: 10/06/2022 MRN: [id number] Attending MD: Andria Meuse MD, MD,  Date of Birth: Jan 10, 1961 CSN: [id number] Age: 62 Admit Type: Outpatient Procedure:                Flexible Sigmoidoscopy Indications:              Personal history of malignant rectal neoplasm,                            Preoperative assessment Providers:                Stephanie Coup. Digby Groeneveld MD, MD, Trudee Kuster, RN,                            Leanne Lovely, Technician Referring MD:              Medicines:                Monitored Anesthesia Care Complications:            No immediate complications. Estimated Blood Loss:     Estimated blood loss: none. Procedure:                Pre-Anesthesia Assessment:                           - Prior to the procedure, a History and Physical                            was performed, and patient medications, allergies                            and sensitivities were reviewed. The patient's                            tolerance of previous anesthesia was reviewed.                           - The risks and benefits of the procedure and the                            sedation options and risks were discussed with the                            patient. All questions were answered and informed                            consent was obtained.                           - Patient identification and proposed procedure                            were verified prior to the procedure by the                            physician, the nurse, the anesthetist and  the                            technician. The procedure was verified in the                            pre-procedure area in the endoscopy suite.                           After obtaining informed consent, the scope was                            passed under direct vision. The PCF-HQ190L                            ([id number]) Olympus colonoscope was  introduced                            through the anus and advanced to the the splenic                            flexure. The flexible sigmoidoscopy was                            accomplished without difficulty. The patient                            tolerated the procedure well. The quality of the                            bowel preparation was fair. Scope In: 10:25:11 AM Scope Out: 10:38:56 AM Total Procedure Duration: 0 hours 13 minutes 45 seconds  Findings:      The perianal and digital rectal examinations were normal. Pertinent       negatives include normal sphincter tone and no palpable rectal lesions.      There was evidence of a prior end-to-end colo-colonic anastomosis in the       proximal rectum. This was patent and was characterized by healthy       appearing mucosa. The anastomosis was traversed. Estimated blood loss:       none.      A few small-mouthed diverticula were found in the descending colon.      A small amount of stool was found in the descending colon, at the       splenic flexure and in the transverse colon, precluding visualization.       Estimated blood loss: none. Lavage of the area was performed using a       moderate amount, resulting in clearance with fair visualization. Impression:               - Preparation of the colon was fair.                           - Patent end-to-end colo-colonic anastomosis,                            characterized by healthy appearing  mucosa.                           - Diverticulosis in the descending colon.                           - Stool in the descending colon, at the splenic                            flexure and in the transverse colon.                           - No specimens collected. Moderate Sedation:      Not Applicable - Patient had care per Anesthesia. Recommendation:           - Continue present medications.                           - Resume previous diet. Procedure Code(s):        --- Professional  ---                           [phone], Sigmoidoscopy, flexible; diagnostic,                            including collection of specimen(s) by brushing or                            washing, when performed (separate procedure) Diagnosis Code(s):        --- Professional ---                           W29.562, Encounter for other preprocedural                            examination                           Z85.048, Personal history of other malignant                            neoplasm of rectum, rectosigmoid junction, and anus                           Z98.0, Intestinal bypass and anastomosis status                           K57.30, Diverticulosis of large intestine without                            perforation or abscess without bleeding CPT copyright 2022 American [employer] Association. All rights reserved. The codes documented in this report are preliminary and upon coder review may  be revised to meet current compliance requirements. Marin Olp, MD Andria Meuse MD, MD 10/06/2022 10:49:49 AM This report has been signed electronically. Number of Addenda: 0

## 2022-10-06 NOTE — Anesthesia Preprocedure Evaluation (Signed)
Anesthesia Evaluation  Patient identified by MRN, date of birth, ID band Patient awake    Reviewed: Allergy & Precautions, NPO status , Patient's Chart, lab work & pertinent test results  History of Anesthesia Complications (+) PONV and history of anesthetic complications  Airway Mallampati: II  TM Distance: >3 FB Neck ROM: Full    Dental   Pulmonary asthma , former smoker   breath sounds clear to auscultation       Cardiovascular negative cardio ROS  Rhythm:Regular Rate:Normal     Neuro/Psych  Headaches    GI/Hepatic Neg liver ROS,GERD  ,,  Endo/Other  diabetes, Type 2    Renal/GU negative Renal ROS     Musculoskeletal  (+) Arthritis ,    Abdominal   Peds  Hematology negative hematology ROS (+)   Anesthesia Other Findings   Reproductive/Obstetrics                             Anesthesia Physical Anesthesia Plan  ASA: 2  Anesthesia Plan: MAC   Post-op Pain Management:    Induction:   PONV Risk Score and Plan: 2 and Propofol infusion and Ondansetron  Airway Management Planned: Natural Airway and Simple Face Mask  Additional Equipment: None  Intra-op Plan:   Post-operative Plan:   Informed Consent: I have reviewed the patients History and Physical, chart, labs and discussed the procedure including the risks, benefits and alternatives for the proposed anesthesia with the patient or authorized representative who has indicated his/her understanding and acceptance.       Plan Discussed with: CRNA  Anesthesia Plan Comments:        Anesthesia Quick Evaluation

## 2022-10-06 NOTE — Transfer of Care (Signed)
Immediate Anesthesia Transfer of Care Note  Patient: [name] [name]  Procedure(s) Performed: FLEXIBLE SIGMOIDOSCOPY  Patient Location: short stay  Anesthesia Type:MAC  Level of Consciousness: awake, drowsy, and patient cooperative  Airway & Oxygen Therapy: Patient Spontanous Breathing  Post-op Assessment: Report given to RN, Post -op Vital signs reviewed and stable, and Patient moving all extremities X 4  Post vital signs: Reviewed and stable  Last Vitals:  Vitals Value Taken Time  BP    Temp    Pulse 84 10/06/22 1043  Resp 11 10/06/22 1043  SpO2 95 % 10/06/22 1043  Vitals shown include unvalidated device data.  Last Pain:  Vitals:   10/06/22 0923  TempSrc: Temporal  PainSc: 0-No pain         Complications: No notable events documented.

## 2022-10-06 NOTE — H&P (Signed)
CC: Here today for flexible sigmoidoscopy  HPI: [name] [name] is an 62 y.o. male  with history of HTN, HLD, DM, BPH, whom is seen in the office today as a referral by Dr. Lendon Colonel for evaluation of rectal cancer.  Colonoscopy with Dr. Rhea Belton 10/27/21 - 1. Fungating ulcerated large mass in the rectosigmoid colon, partially circumferential. 4 cm in length. Biopsied. Tattooed distally. 2. 2 mm cecal polyp removed. 3. 6 mm cecal polyp removed. PATH: Rectal biopsy mass moderately differentiated adenocarcinoma. Other polyps were tubular adenomas/benign colon. No dysplasia.  CT CAP 11/03/21 -circumferential wall thickening in the rectum. 2. Lesion of the posterior right lobe of the liver suspicious for liver metastasis. 3. No other evidence of lymphadenopathy or metastatic disease. 4. Hepatomegaly and hepatic steatosis. 5. Prostatomegaly 6. CAD, aortic atherosclerosis.  MRI Abd and pelvis 11/12/21 - 1. cmri G2XB2 with >6 LNs along sup rectal vein and within mesorectum. +Extramesorectal adenopathy along upper margin of S1 2. 3 x 3.4 x 3 cm subcapsular lesion in posterior right liver seg VII felt to represent liver met.  He had 1 episode of some blood in his stool around Valentine's day this year, none prior or since. He underwent colonoscopy with Dr. Rhea Belton for further evaluation as noted above.  Case was reviewed at multidisciplinary tumor board. He was also seen with our hepatobiliary surgeon, Dr. Freida Busman. He underwent systemic therapy with FOLFOX and in the midst of this underwent surgery with Dr. Vicki Mallet laparoscopy, partial right hepatectomy (metastatic ectomy of segment 7 tumor), intraoperative ultrasound.  Pathology demonstrated metastatic adenocarcinoma.  He is now undergoing Xeloda/radiation which began 05/29/2022, planned completion 07/07/22. He was recently seen in the ER 06/18/2022 with midepigastric/right upper quadrant pain. He does have a history of similar in the past  particular in the immediate postoperative period. This radiated into his chest. He thought it was indigestion and took several OTC meds but that did not help. He subsequently presented to the emergency room for further evaluation. He underwent workup including a CT scan which demonstrated gallstones. CBC and LFTs were normal. Lipase was normal. No evident cholecystitis. His symptoms had improved and he was discharged from the emergency room.  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 Similar circumferential rectal wall thickening reflecting patient's known primary neoplasm. 2. Decreased size of the metastatic hepatic lesion. No new suspicious hepatic lesion identified. 3. No evidence of new or progressive disease in the chest, abdomen or pelvis. 4. Mild hepatomegaly and hepatic steatosis. 5. Colonic diverticulosis without findings of acute diverticulitis. 6. Aortic Atherosclerosis (ICD10-I70.0).  CTA Chest 12/24- no PE CT A/P 12/24 - 1. Negative for acute pulmonary embolism. 2. Cholelithiasis. No evidence of cholecystitis or biliary dilation. If there is concern for acute cholecystitis consider right upper quadrant ultrasound. 3. Slight increase in circumferential rectal wall thickening concerning for progression of known primary neoplasm. 4. Interval resection of the metastasis in hepatic segment VII. No new suspicious hepatic lesion. 5. Colonic diverticulosis without diverticulitis. 6. Aortic Atherosclerosis (ICD10-I70.0).  He is here today for scheduled follow-up. He denies any complaints at present aside from the midepigastric pain he experienced a few nights ago. Currently, no nausea or vomiting. No abdominal pain. He denies any rectal bleeding. He did have some significant change in his stools following initiation of radiation including additional mucus. That has since improved and resolved.  Plan radiation completion date of 07/07/2022.  OR 09/01/22 Robotic assisted low  anterior resection with colorectal anastomosis, diverting loop  ileostomy Diagnostic flexible sigmoidoscopy (necessary to confirm location of lesion, margin) Robotic lysis of adhesions x 60 minutes Intraoperative assessment of perfusion using ICG fluorescence imaging Bilateral transversus abdominus plane (TAP) blocks.  FINDINGS: Adhesions within the lower right lower quadrant between small bowel and small bowel mesentery as well as small bowel and other loops of small bowel. Adhesiolysis necessary for ultimate creation of diverting loop ileostomy. Omental containing adhesions across much of his upper abdomen precluding significant visualization of his liver.  No obvious metastatic disease on visceral parietal peritoneum. Tattoo evident in the mid rectum just distal to the peritoneal reflection. On diagnostic flexible sigmoidoscopy, the tattoo is well distal to the endoluminal mass.  A well perfused, tension free, hemostatic, air tight 31 mm EEA colorectal anastomosis fashioned 10 cm from the anal verge by flexible sigmoidoscopy; anatomically this is at the level of the mid rectum and is clearly well distal to the peritoneal reflection. Diverting loop ileostomy created at conclusion.  PATH A. RECTOSIGMOID COLON, LOW ANTERIOR RESECTION: Invasive moderately differentiated adenocarcinoma with focal mucinous features focally invading through muscularis propria (ypT3) Tumor measures 3.0 x 2.2 x 0.5 cm Margins free Two of twelve perirectal lymph nodes with metastatic carcinoma (2/11, ypT1b) Two tumor deposits (tumor deposit 0.25 cm from radial margin) Changes compatible with neoadjuvant therapy  B. FINAL DISTAL MARGIN, EXCISION: Benign colonic mucosa Negative for carcinoma  INTERVAL HX Seen in office earlier this week. Doing well. Back at work. Ostomy working well. No complaints today. No changes in his health or health hx since we saw him 4 days ago.   PMH: HTN, HLD, DM, BPH  PSH: Open  appendectomy 1980s  FHx: Multiple relatives with cancer including colon. 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 He returns today with his wife   Past Medical History:  Diagnosis Date   Allergy    SEASONAL   Arthritis    Asthma    only due to hey fever   Blood in stool    Cancer 10/2021   rectal cancer   Diabetes mellitus without complication 08/25/2014   type 2   Family history of breast cancer    Family history of colon cancer    GERD (gastroesophageal reflux disease)    Hay fever    History of back pain    Hx of colonic polyps    Migraines    PONV (postoperative nausea and vomiting)    also history of motion sickness    Past Surgical History:  Procedure Laterality Date   ANTERIOR CERVICAL DECOMP/DISCECTOMY FUSION  02/20/2012   Procedure: ANTERIOR CERVICAL DECOMPRESSION/DISCECTOMY FUSION 1 LEVEL;  Surgeon: Maeola Harman, MD;  Location: [facility];  Service: Neurosurgery;  Laterality: N/A;  Cervical six - seven  Anterior cervical decompression/diskectomy/fusion   APPENDECTOMY  06/26/1982   COLONOSCOPY     DIVERTING ILEOSTOMY N/A 09/01/2022   Procedure: DIVERTING LOOP ILEOSTOMY;  Surgeon: Andria Meuse, MD;  Location: [facility];  Service: General;  Laterality: N/A;   FLEXIBLE SIGMOIDOSCOPY N/A 09/01/2022   Procedure: FLEXIBLE SIGMOIDOSCOPY;  Surgeon: Andria Meuse, MD;  Location: [facility];  Service: General;  Laterality: N/A;   IR IMAGING GUIDED PORT INSERTION  11/28/2021   right side   KNEE ARTHROSCOPY     LAPAROSCOPIC LYSIS OF ADHESIONS  09/01/2022   Procedure: LAPAROSCOPIC LYSIS OF ADHESIONS;  Surgeon: Andria Meuse,  MD;  Location: [facility];  Service: General;;   LAPAROSCOPY N/A 03/10/2022   Procedure: STAGING LAPAROSCOPY;  Surgeon: Fritzi Mandes, MD;  Location: [facility] OR;  Service: General;  Laterality: N/A;   NASAL  SEPTUM SURGERY  06/26/2006   OPEN PARTIAL HEPATECTOMY  N/A 03/10/2022   Procedure: OPEN PARTIAL HEPATECTOMY;  Surgeon: Fritzi Mandes, MD;  Location: [facility] OR;  Service: General;  Laterality: N/A;   SPINAL CORD STIMULATOR INSERTION N/A 03/10/2021   Procedure: SPINAL CORD STIMULATOR PLACEMENT;  Surgeon: Venita Lick, MD;  Location: [facility] OR;  Service: Orthopedics;  Laterality: N/A;  2.5 HRS 3C-BED   WISDOM TOOTH EXTRACTION     XI ROBOTIC ASSISTED LOWER ANTERIOR RESECTION N/A 09/01/2022   Procedure: ROBOTIC ASSISTED LOWER ANTERIOR RESECTION AND INTRAOPERATIVE ASSESSMENT OF PERFUSION USING FIREFLY DYE;  Surgeon: Andria Meuse, MD;  Location: [facility];  Service: General;  Laterality: N/A;    Family History  Problem Relation Age of Onset   Diabetes Mother    Breast cancer Mother 8   Diabetes Father        type II   Colon polyps Sister        11+ polyps   Other Brother        reportedly negative genetic testing   Diabetes Brother    Colon cancer Maternal Aunt    Colon cancer Maternal Uncle        x2   Colon cancer Maternal Uncle        x3   Colon cancer Maternal Uncle    Colon cancer Maternal Uncle    Colon cancer Maternal Uncle    Colon cancer Maternal Uncle    Other Maternal Uncle        farm accident as a teen   Colon cancer Maternal Grandmother    Cancer Maternal Grandmother        breast, colon    Diabetes Paternal Grandfather    Colon cancer Cousin        <50   Colon cancer Cousin        maternal cousin's children   Esophageal cancer Neg Hx    Rectal cancer Neg Hx    Stomach cancer Neg Hx     Social:  reports that he quit smoking about 37 years ago. His smoking use included cigarettes. He smoked an average of 1 pack per day. He has never been exposed to tobacco smoke. He has never used smokeless tobacco. He reports that he does not currently use alcohol. He reports that he does not use drugs.  Allergies:  Allergies  Allergen Reactions   Other Swelling     Hayfever,GRASS,DOGS,CATS  Adhesive used during port insertion---SKIN TEARS!    Medications: I have reviewed the patient's current medications.  Results for orders placed or performed during the hospital encounter of 10/06/22 (from the past 48 hour(s))  Glucose, capillary     Status: Abnormal   Collection Time: 10/06/22  9:30 AM  Result Value Ref Range   Glucose-Capillary 142 (H) 70 - 99 mg/dL    Comment: Glucose reference range applies only to samples taken after fasting for at least 8 hours.    No results found.  ROS - all of the below systems have been reviewed with the patient and positives are indicated with bold text General: chills, fever or night sweats Eyes: blurry vision or double vision ENT: epistaxis or sore throat Allergy/Immunology: itchy/watery eyes or nasal congestion Hematologic/Lymphatic: bleeding problems, blood clots or swollen lymph  nodes Endocrine: temperature intolerance or unexpected weight changes Breast: new or changing breast lumps or nipple discharge Resp: cough, shortness of breath, or wheezing CV: chest pain or dyspnea on exertion GI: as per HPI GU: dysuria, trouble voiding, or hematuria MSK: joint pain or joint stiffness Neuro: TIA or stroke symptoms Derm: pruritus and skin lesion changes Psych: anxiety and depression  PE Blood pressure 116/68, pulse 82, temperature 99.5 F (37.5 C), temperature source Temporal, resp. rate 13, height 6' (1.829 m), weight 108 kg, SpO2 98 %. Constitutional: NAD; conversant Eyes: Moist conjunctiva Lungs: Normal respiratory effort CV: RRR Psychiatric: Appropriate affect  Results for orders placed or performed during the hospital encounter of 10/06/22 (from the past 48 hour(s))  Glucose, capillary     Status: Abnormal   Collection Time: 10/06/22  9:30 AM  Result Value Ref Range   Glucose-Capillary 142 (H) 70 - 99 mg/dL    Comment: Glucose reference range applies only to samples taken after fasting for at least  8 hours.    No results found.   A/P: [name] [name] is an 62 y.o. male with hx of HTN, HLD, DM, BPH with hx rectal cancer  [id number] FOLFOX OR with Dr. Freida Busman 03/10/22 -open right partial hepatectomy-segment 7 tumor  Completed chemo/XRT 07/07/22  OR 09/01/22 - Robotic LAR/DLI, LoA, Flex sig Path - ypT3N1b  Possible symptomatic cholelithiasis - monitoring for now with Dr. Freida Busman  -We have discussed flexible sigmoidoscopy diagnostic for anastomotic assessment, preop evaluation prior to possible loop ileostomy reversal -The planned procedure, material risks (including, but not limited to, bleeding, infection, damage to surrounding organs, need for additional procedures, worsening of pre-existing medical conditions, missed lesions, pneumonia, heart attack, stroke, death) benefits and alternatives to procedure were discussed. The patient's questions were answered to his satisfaction, he voiced understanding and elected to proceed with planned flexible sigmoidoscopy  -Continue following with medical oncology for surveillance.  Marin Olp, MD [facility]

## 2022-10-09 ENCOUNTER — Encounter (HOSPITAL_COMMUNITY): Payer: Self-pay | Admitting: Surgery

## 2022-10-09 ENCOUNTER — Other Ambulatory Visit (HOSPITAL_COMMUNITY): Payer: Self-pay | Admitting: Nurse Practitioner

## 2022-10-09 DIAGNOSIS — L24B3 Irritant contact dermatitis related to fecal or urinary stoma or fistula: Secondary | ICD-10-CM

## 2022-10-09 DIAGNOSIS — K9400 Colostomy complication, unspecified: Secondary | ICD-10-CM

## 2022-10-09 NOTE — Anesthesia Postprocedure Evaluation (Signed)
Anesthesia Post Note  Patient: [name] [name]  Procedure(s) Performed: FLEXIBLE SIGMOIDOSCOPY     Patient location during evaluation: PACU Anesthesia Type: MAC Level of consciousness: awake and alert Pain management: pain level controlled Vital Signs Assessment: post-procedure vital signs reviewed and stable Respiratory status: spontaneous breathing, nonlabored ventilation, respiratory function stable and patient connected to nasal cannula oxygen Cardiovascular status: stable and blood pressure returned to baseline Postop Assessment: no apparent nausea or vomiting Anesthetic complications: no   No notable events documented.  Last Vitals:  Vitals:   10/06/22 1100 10/06/22 1114  BP: (!) 116/90 107/74  Pulse: 89 83  Resp: 17 15  Temp:    SpO2: 96% 98%    Last Pain:  Vitals:   10/06/22 1100  TempSrc:   PainSc: 0-No pain                 Kennieth Rad

## 2022-10-10 ENCOUNTER — Telehealth: Payer: Self-pay | Admitting: Family

## 2022-10-10 DIAGNOSIS — E1165 Type 2 diabetes mellitus with hyperglycemia: Secondary | ICD-10-CM

## 2022-10-10 DIAGNOSIS — E1169 Type 2 diabetes mellitus with other specified complication: Secondary | ICD-10-CM

## 2022-10-10 MED ORDER — TRULICITY 1.5 MG/0.5ML ~~LOC~~ SOAJ: 1.5000 mg | SUBCUTANEOUS | 2 refills | Status: DC

## 2022-10-10 NOTE — Telephone Encounter (Signed)
Refill sent.

## 2022-10-10 NOTE — Telephone Encounter (Signed)
  Prescription Request  10/10/2022  Is this a "Controlled Substance" medicine? no  Have you seen your PCP in the last 2 weeks? No appt on 11/16/22  If YES, route message to pool  -  If NO, patient needs to be scheduled for appointment.  What is the name of the medication or equipment? Dulaglutide (TRULICITY) 1.5 MG/0.5ML SOPN   Have you contacted your pharmacy to request a refill? yes   Which pharmacy would you like this sent to? [facility]   Patient notified that their request is being sent to the clinical staff for review and that they should receive a response within 2 business days.

## 2022-10-13 ENCOUNTER — Ambulatory Visit (HOSPITAL_COMMUNITY)
Admission: RE | Admit: 2022-10-13 | Discharge: 2022-10-13 | Disposition: A | Discharge status: Home / Self Care | Payer: Commercial Managed Care - PPO | Source: Home / Self Care | Attending: Surgery | Admitting: Surgery

## 2022-10-13 DIAGNOSIS — C20 Malignant neoplasm of rectum: Secondary | ICD-10-CM

## 2022-10-13 DIAGNOSIS — Z01818 Encounter for other preprocedural examination: Secondary | ICD-10-CM | POA: Diagnosis not present

## 2022-10-25 ENCOUNTER — Inpatient Hospital Stay: Payer: Commercial Managed Care - PPO | Admitting: Oncology

## 2022-10-25 ENCOUNTER — Other Ambulatory Visit: Payer: Self-pay | Admitting: *Deleted

## 2022-10-25 ENCOUNTER — Inpatient Hospital Stay: Payer: Commercial Managed Care - PPO | Attending: Oncology

## 2022-10-25 ENCOUNTER — Inpatient Hospital Stay: Payer: Commercial Managed Care - PPO

## 2022-10-25 VITALS — BP 123/86 | HR 87 | Temp 98.1°F | Resp 18 | Ht 72.0 in | Wt 244.5 lb

## 2022-10-25 DIAGNOSIS — G620 Drug-induced polyneuropathy: Secondary | ICD-10-CM | POA: Insufficient documentation

## 2022-10-25 DIAGNOSIS — E119 Type 2 diabetes mellitus without complications: Secondary | ICD-10-CM | POA: Diagnosis not present

## 2022-10-25 DIAGNOSIS — G8929 Other chronic pain: Secondary | ICD-10-CM | POA: Insufficient documentation

## 2022-10-25 DIAGNOSIS — Z803 Family history of malignant neoplasm of breast: Secondary | ICD-10-CM | POA: Diagnosis not present

## 2022-10-25 DIAGNOSIS — C19 Malignant neoplasm of rectosigmoid junction: Secondary | ICD-10-CM | POA: Diagnosis present

## 2022-10-25 DIAGNOSIS — C20 Malignant neoplasm of rectum: Secondary | ICD-10-CM

## 2022-10-25 DIAGNOSIS — R2241 Localized swelling, mass and lump, right lower limb: Secondary | ICD-10-CM | POA: Insufficient documentation

## 2022-10-25 DIAGNOSIS — C787 Secondary malignant neoplasm of liver and intrahepatic bile duct: Secondary | ICD-10-CM | POA: Diagnosis not present

## 2022-10-25 DIAGNOSIS — Z800 Family history of malignant neoplasm of digestive organs: Secondary | ICD-10-CM | POA: Diagnosis not present

## 2022-10-25 LAB — BASIC METABOLIC PANEL - CANCER CENTER ONLY
Anion gap: 8 (ref 5–15)
BUN: 12 mg/dL (ref 8–23)
CO2: 23 mmol/L (ref 22–32)
Calcium: 8.9 mg/dL (ref 8.9–10.3)
Chloride: 105 mmol/L (ref 98–111)
Creatinine: 0.75 mg/dL (ref 0.61–1.24)
GFR, Estimated: >60 mL/min (ref >60)
Glucose, Bld: 138 mg/dL — ABNORMAL HIGH (ref 70–99)
Potassium: 4.2 mmol/L (ref 3.5–5.1)
Sodium: 136 mmol/L (ref 135–145)

## 2022-10-25 LAB — CEA (ACCESS): CEA (CHCC): 1.23 ng/mL (ref 0.00–5.00)
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
## 2022-10-26 ENCOUNTER — Other Ambulatory Visit: Payer: Self-pay

## 2022-11-07 ENCOUNTER — Other Ambulatory Visit (HOSPITAL_COMMUNITY): Payer: Self-pay | Admitting: Nurse Practitioner

## 2022-11-07 ENCOUNTER — Ambulatory Visit (HOSPITAL_COMMUNITY)
Admission: RE | Admit: 2022-11-07 | Discharge: 2022-11-07 | Disposition: A | Discharge status: Home / Self Care | Payer: Commercial Managed Care - PPO | Source: Ambulatory Visit | Attending: Nurse Practitioner | Admitting: Nurse Practitioner

## 2022-11-07 DIAGNOSIS — Z932 Ileostomy status: Secondary | ICD-10-CM | POA: Diagnosis not present

## 2022-11-07 DIAGNOSIS — C20 Malignant neoplasm of rectum: Secondary | ICD-10-CM | POA: Insufficient documentation

## 2022-11-07 DIAGNOSIS — L24B3 Irritant contact dermatitis related to fecal or urinary stoma or fistula: Secondary | ICD-10-CM

## 2022-11-07 DIAGNOSIS — Z433 Encounter for attention to colostomy: Secondary | ICD-10-CM | POA: Diagnosis not present

## 2022-11-07 MED ORDER — AMOXICILLIN-POT CLAVULANATE 500-125 MG PO TABS: 1.0000 | ORAL_TABLET | Freq: Two times a day (BID) | ORAL | 0 refills | Status: AC

## 2022-11-07 NOTE — Progress Notes (Signed)
[facility]   Reason for visit:  LUQ colostomy with new peristomal breakdown with purulence noted  photo in chart HPI:  Rectal cancer with ileostomy Past Medical History:  Diagnosis Date  . Allergy    SEASONAL  . Arthritis   . Asthma    only due to hey fever  . Blood in stool   . Cancer (HCC) 10/2021   rectal cancer  . Diabetes mellitus without complication (HCC) 08/25/2014   type 2  . Family history of breast cancer   . Family history of colon cancer   . GERD (gastroesophageal reflux disease)   . Hay fever   . History of back pain   . Hx of colonic polyps   . Migraines   . PONV (postoperative nausea and vomiting)    also history of motion sickness   Family History  Problem Relation Age of Onset  . Diabetes Mother   . Breast cancer Mother 31  . Diabetes Father        type II  . Colon polyps Sister        11+ polyps  . Other Brother        reportedly negative genetic testing  . Diabetes Brother   . Colon cancer Maternal Aunt   . Colon cancer Maternal Uncle        x2  . Colon cancer Maternal Uncle        x3  . Colon cancer Maternal Uncle   . Colon cancer Maternal Uncle   . Colon cancer Maternal Uncle   . Colon cancer Maternal Uncle   . Other Maternal Uncle        farm accident as a teen  . Colon cancer Maternal Grandmother   . Cancer Maternal Grandmother        breast, colon   . Diabetes Paternal Grandfather   . Colon cancer Cousin        <50  . Colon cancer Cousin        maternal cousin's children  . Esophageal cancer Neg Hx   . Rectal cancer Neg Hx   . Stomach cancer Neg Hx    Allergies  Allergen Reactions  . Other Swelling    Hayfever,GRASS,DOGS,CATS  Adhesive used during port insertion---SKIN TEARS!   Current Outpatient Medications  Medication Sig Dispense Refill Last Dose  . amoxicillin-clavulanate (AUGMENTIN) 500-125 MG tablet Take 1 tablet by mouth 2 (two) times daily for 7 days. 14 tablet 0   . Continuous Blood Gluc Sensor  (FREESTYLE LIBRE 3 SENSOR) MISC Place 1 sensor on the skin every 14 days. Use to check glucose continuously.  DX: E11.65 2 each 11   . Dulaglutide (TRULICITY) 1.5 MG/0.5ML SOPN Inject 1.5 mg into the skin once a week. 6 mL 2   . empagliflozin (JARDIANCE) 25 MG TABS tablet Take 1 tablet (25 mg total) by mouth daily before breakfast. 90 tablet 1   . Insulin Pen Needle 29G X MISC 1 Application by Does not apply route at bedtime. 100 each 2   . levocetirizine (XYZAL) 5 MG tablet Take 1 tablet (5 mg total) by mouth every evening. 90 tablet 1   . LORazepam (ATIVAN) 0.5 MG tablet Take 1 tablet (0.5 mg total) by mouth every 8 (eight) hours as needed for anxiety. (Patient taking differently: Take 0.5 mg by mouth every 8 (eight) hours as needed (nausea).) 40 tablet 0   . metFORMIN (GLUCOPHAGE) 500 MG tablet Take 1 tablet (500 mg total)  by mouth 2 (two) times daily with a meal. 180 tablet 1   . montelukast (SINGULAIR) 10 MG tablet Take 1 tablet (10 mg total) by mouth at bedtime. 90 tablet 1   . naproxen sodium (ALEVE) 220 MG tablet Take 440 mg by mouth 2 (two) times daily.     Marland Kitchen omeprazole (PRILOSEC) 40 MG capsule Take 1 capsule (40 mg total) by mouth daily as needed. For acid reflux (Patient taking differently: Take 40 mg by mouth daily before breakfast.) 90 capsule 1   . oxyCODONE-acetaminophen (PERCOCET/ROXICET) 5-325 MG tablet Take 1 tablet by mouth every 6 (six) hours as needed for severe pain. 30 tablet 0   . rosuvastatin (CRESTOR) 10 MG tablet Take 1 tablet (10 mg total) by mouth daily. 90 tablet 1   . tamsulosin (FLOMAX) 0.4 MG CAPS capsule Take 2 capsules (0.8 mg total) by mouth daily. 180 capsule 0    No current facility-administered medications for this encounter.   ROS  Review of Systems  Constitutional:  Positive for fatigue.  Gastrointestinal:        Ileostomy present Reversal planned for next month  Skin:  Positive for rash and wound.       Peristomal breakdown Mucocutaneous  separation at 9 o'clock  Psychiatric/Behavioral: Negative.    All other systems reviewed and are negative. Vital signs:  BP 115/85 (BP Location: Right Arm)   Pulse 88   Temp 98 F (36.7 C) (Oral)   Resp 20   SpO2 95%  Exam:  Physical Exam Vitals reviewed.  Constitutional:      Appearance: Normal appearance.  Abdominal:     Palpations: Abdomen is soft.     Comments: Abdomen with thick hair.  Discussed clipping hair.  Has been trimming with clippers dry.    Skin:    General: Skin is warm and dry.     Findings: Lesion and rash present.  Neurological:     Mental Status: He is alert and oriented to person, place, and time.  Psychiatric:        Mood and Affect: Mood normal.        Behavior: Behavior normal.    Stoma type/location:  RMQ ileostomy Stomal assessment/size:  1 1/4" pink and moist stomal separation at 9 o'clock,   Peristomal assessment:  nonintact lesion consistent with folliculitis and purulence.  Deep erythema and tenderness noted.   Treatment options for stomal/peristomal skin: Aquacel to open wound and separation, covered with barrier ring Output: liquid green stool Ostomy pouching: 1pc. convex Education provided:  demonstrated wound care and provided samples of silver hydrofiber.  Systemic antibiotic (Augmentin) called into local pharmacy of choice as this lesion has been ongoing for more than 5 days and is increasing in pain and drainage.     Impression/dx  Abscess to peristomal skin Ileostomy Contact dermatitis Discussion  See back one week Plan  Wound care to open lesion, begin augmentin.  Back to clinic as needed.     Visit time: 55 minutes.   [name] Hudson FNP-BC

## 2022-11-10 ENCOUNTER — Ambulatory Visit: Payer: Self-pay | Admitting: Surgery

## 2022-11-10 DIAGNOSIS — K9412 Enterostomy infection: Secondary | ICD-10-CM | POA: Insufficient documentation

## 2022-11-10 NOTE — Progress Notes (Signed)
Sent message, via epic in basket, requesting orders in epic from surgeon.  

## 2022-11-10 NOTE — Discharge Instructions (Signed)
Begin aquacel to open wound Augmentin called into pharmacy See back if no improvement

## 2022-11-16 ENCOUNTER — Ambulatory Visit: Payer: Commercial Managed Care - PPO | Admitting: Family

## 2022-11-16 ENCOUNTER — Encounter: Payer: Self-pay | Admitting: Family

## 2022-11-16 VITALS — BP 114/69 | HR 84 | Temp 98.0°F | Ht 72.0 in | Wt 244.4 lb

## 2022-11-16 DIAGNOSIS — E1122 Type 2 diabetes mellitus with diabetic chronic kidney disease: Secondary | ICD-10-CM | POA: Diagnosis not present

## 2022-11-16 DIAGNOSIS — M545 Low back pain, unspecified: Secondary | ICD-10-CM | POA: Diagnosis not present

## 2022-11-16 DIAGNOSIS — J4520 Mild intermittent asthma, uncomplicated: Secondary | ICD-10-CM | POA: Diagnosis not present

## 2022-11-16 DIAGNOSIS — E66811 Obesity, class 1: Secondary | ICD-10-CM

## 2022-11-16 DIAGNOSIS — K219 Gastro-esophageal reflux disease without esophagitis: Secondary | ICD-10-CM

## 2022-11-16 DIAGNOSIS — E785 Hyperlipidemia, unspecified: Secondary | ICD-10-CM

## 2022-11-16 DIAGNOSIS — N401 Enlarged prostate with lower urinary tract symptoms: Secondary | ICD-10-CM

## 2022-11-16 DIAGNOSIS — Z6833 Body mass index (BMI) 33.0-33.9, adult: Secondary | ICD-10-CM

## 2022-11-16 DIAGNOSIS — G8929 Other chronic pain: Secondary | ICD-10-CM

## 2022-11-16 DIAGNOSIS — N183 Chronic kidney disease, stage 3 unspecified: Secondary | ICD-10-CM

## 2022-11-16 DIAGNOSIS — R3914 Feeling of incomplete bladder emptying: Secondary | ICD-10-CM

## 2022-11-16 DIAGNOSIS — E6609 Other obesity due to excess calories: Secondary | ICD-10-CM

## 2022-11-16 DIAGNOSIS — C20 Malignant neoplasm of rectum: Secondary | ICD-10-CM

## 2022-11-16 DIAGNOSIS — Z932 Ileostomy status: Secondary | ICD-10-CM

## 2022-11-16 LAB — CBC WITH DIFFERENTIAL/PLATELET
Basophils Absolute: 0.1 10*3/uL (ref 0.0–0.2)
Basos: 2 %
EOS (ABSOLUTE): 0.2 10*3/uL (ref 0.0–0.4)
Eos: 4 %
Hematocrit: 48.5 % (ref 37.5–51.0)
Hemoglobin: 16 g/dL (ref 13.0–17.7)
Immature Grans (Abs): 0 10*3/uL (ref 0.0–0.1)
Immature Granulocytes: 0 %
Lymphocytes Absolute: 0.5 10*3/uL — ABNORMAL LOW (ref 0.7–3.1)
Lymphs: 13 %
MCH: 28.2 pg (ref 26.6–33.0)
MCHC: 33 g/dL (ref 31.5–35.7)
MCV: 85 fL (ref 79–97)
Monocytes Absolute: 0.4 10*3/uL (ref 0.1–0.9)
Monocytes: 9 %
Neutrophils Absolute: 2.9 10*3/uL (ref 1.4–7.0)
Neutrophils: 72 %
Platelets: 180 10*3/uL (ref 150–450)
RBC: 5.68 x10E6/uL (ref 4.14–5.80)
RDW: 13.9 % (ref 11.6–15.4)
WBC: 4.1 10*3/uL (ref 3.4–10.8)

## 2022-11-16 LAB — CMP14+EGFR
ALT: 44 IU/L (ref 0–44)
AST: 27 IU/L (ref 0–40)
Albumin/Globulin Ratio: 1.6 (ref 1.2–2.2)
Albumin: 4.4 g/dL (ref 3.9–4.9)
Alkaline Phosphatase: 115 IU/L (ref 44–121)
BUN/Creatinine Ratio: 14 (ref 10–24)
BUN: 14 mg/dL (ref 8–27)
Bilirubin Total: 0.5 mg/dL (ref 0.0–1.2)
CO2: 19 mmol/L — ABNORMAL LOW (ref 20–29)
Calcium: 9.6 mg/dL (ref 8.6–10.2)
Chloride: 105 mmol/L (ref 96–106)
Creatinine, Ser: 0.99 mg/dL (ref 0.76–1.27)
Globulin, Total: 2.7 g/dL (ref 1.5–4.5)
Glucose: 136 mg/dL — ABNORMAL HIGH (ref 70–99)
Potassium: 4.8 mmol/L (ref 3.5–5.2)
Sodium: 139 mmol/L (ref 134–144)
Total Protein: 7.1 g/dL (ref 6.0–8.5)
eGFR: 86 mL/min/{1.73_m2} (ref >59)

## 2022-11-16 LAB — BAYER DCA HB A1C WAIVED: HB A1C (BAYER DCA - WAIVED): 7.4 % — ABNORMAL HIGH (ref 4.8–5.6)

## 2022-11-16 IMAGING — CT CT CHEST-ABD-PELV W/ CM
2 of 5 series · 15 of 46 positions shown, 17 images · IV contrast (APPLIED)
Comparison: None Available.

CLINICAL DATA: New diagnosis colon cancer staging * Tracking Code:
BO *

EXAM:
CT CHEST, ABDOMEN, AND PELVIS WITH CONTRAST
TECHNIQUE: Multidetector CT imaging of the chest, abdomen and pelvis was
performed following the standard protocol during bolus
administration of intravenous contrast.

[Series 2: cap with · axial · 0.97mm/px · z∈[+739,+1329]mm · 12 of 140 slices shown, 14 images]
[im 11/140  soft-tissue]
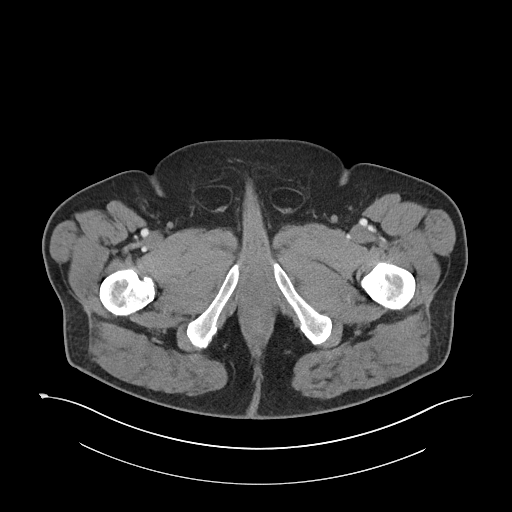
[im 11/140  bone]
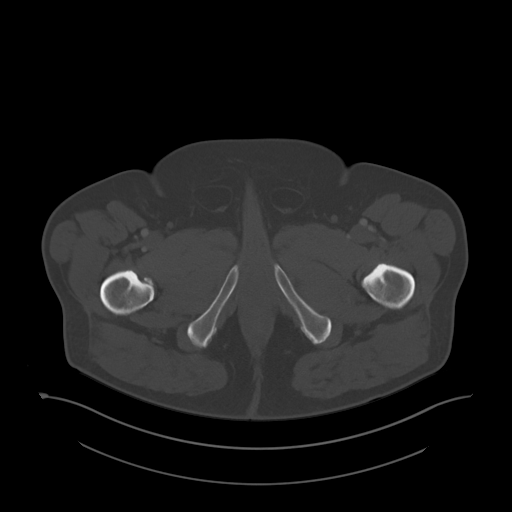
[im 22/140  soft-tissue]
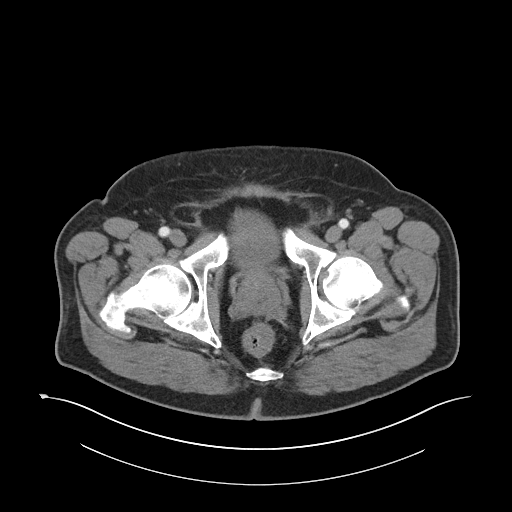
[im 33/140  soft-tissue]
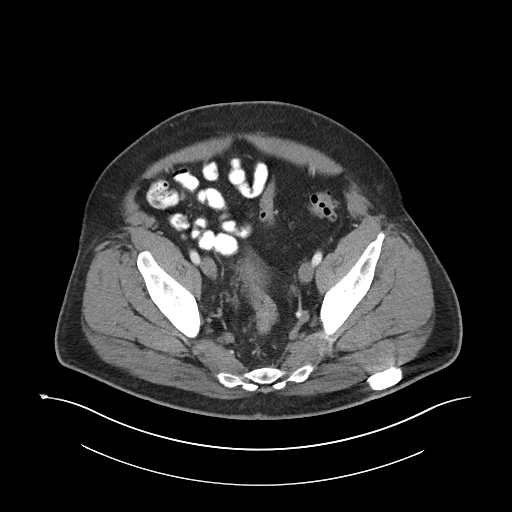
[im 43/140  soft-tissue]
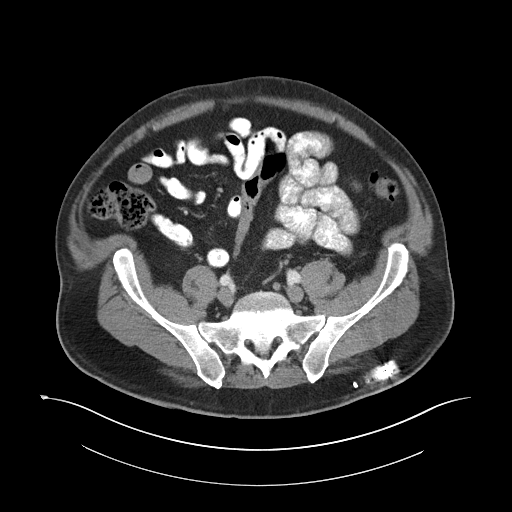
[im 54/140  soft-tissue]
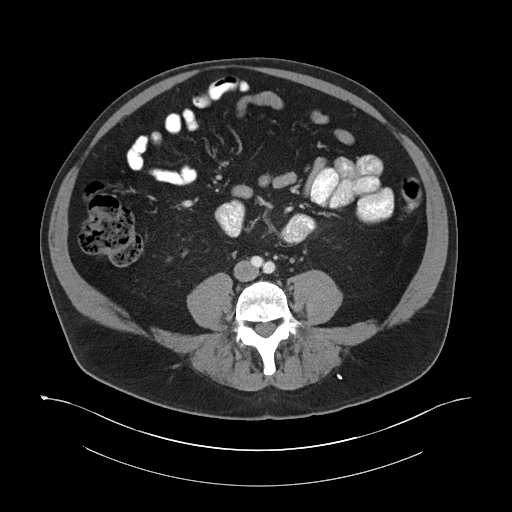
[im 65/140  soft-tissue]
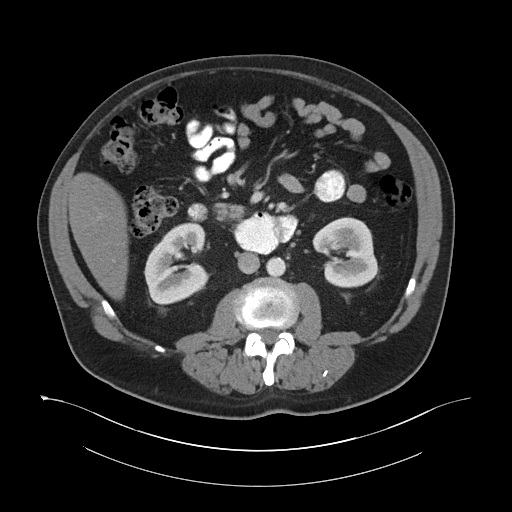
[im 75/140  soft-tissue]
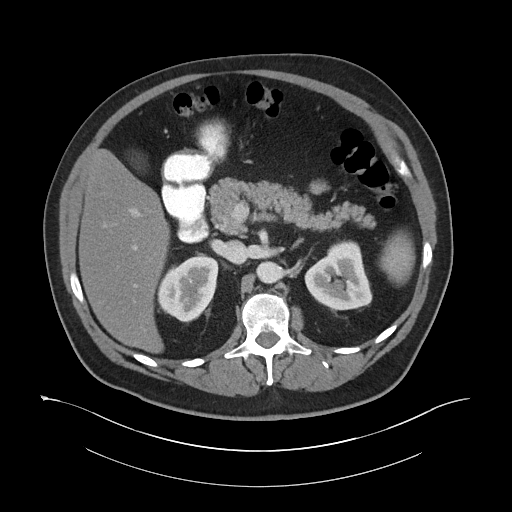
[im 86/140  soft-tissue]
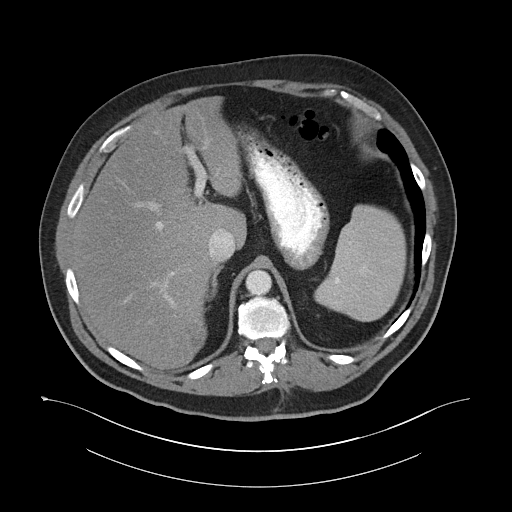
[im 97/140  soft-tissue]
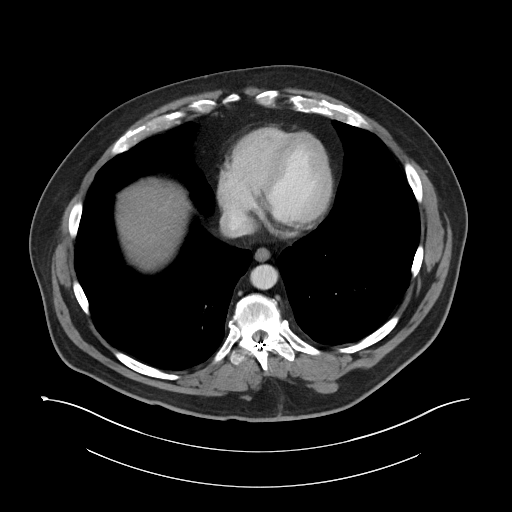
[im 97/140  bone]
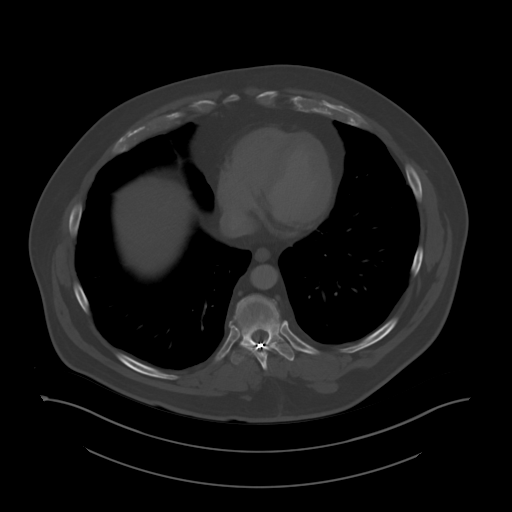
[im 107/140  soft-tissue]
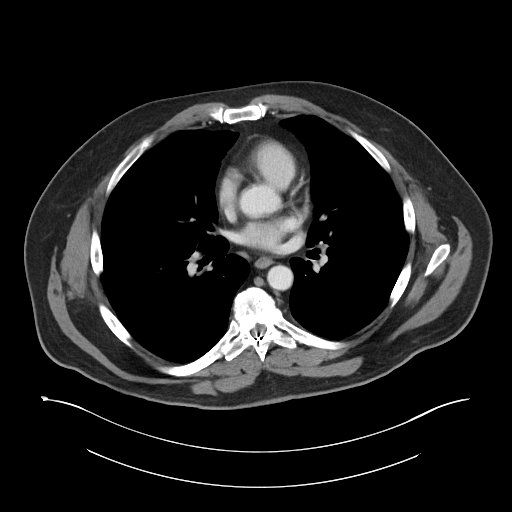
[im 118/140  soft-tissue]
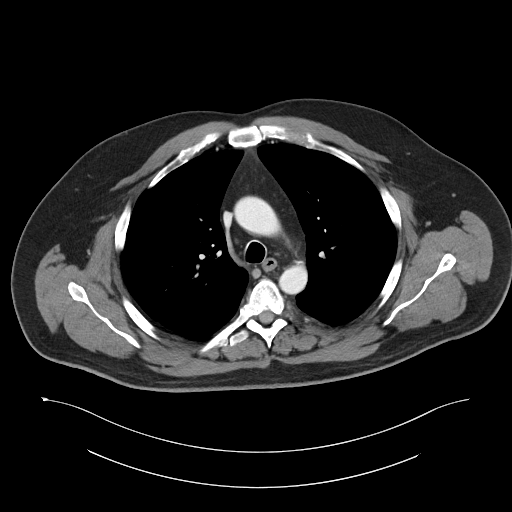
[im 129/140  soft-tissue]
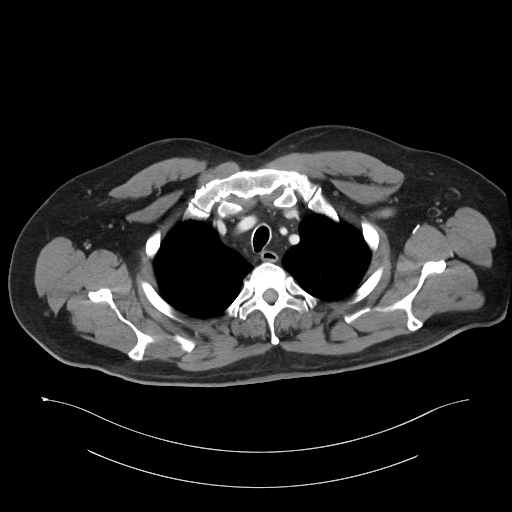

[Series 5: coronal · coronal · 0.96mm/px · 3 of 122 slices shown]
[im 41/122  soft-tissue]
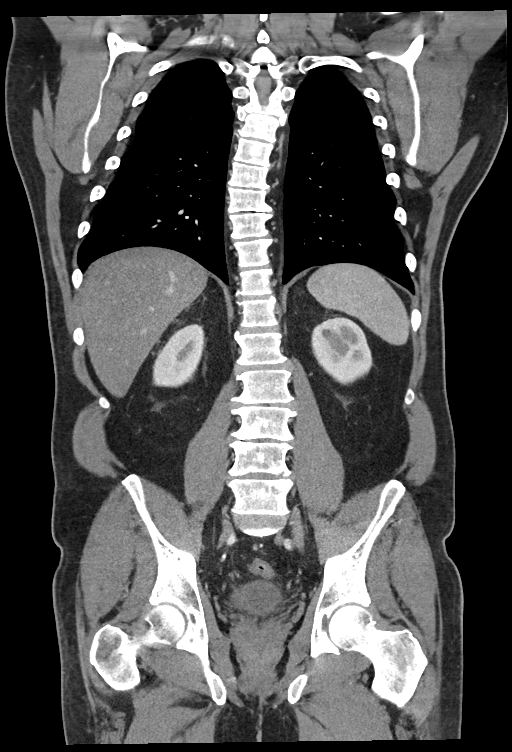
[im 54/122  soft-tissue]
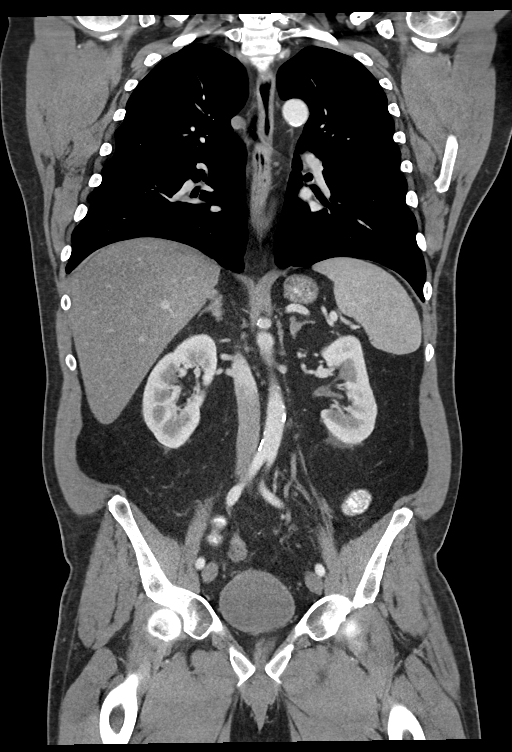
[im 68/122  soft-tissue]
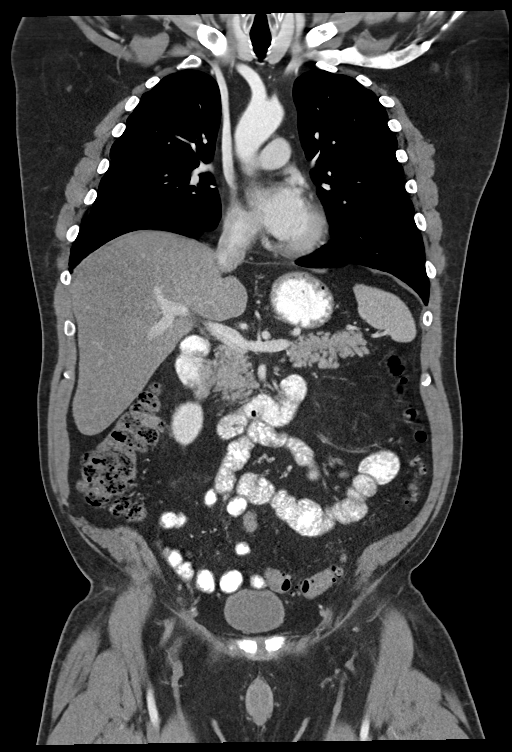

[15 of 46 positions shown; findings below may reference images not displayed]

RADIATION DOSE REDUCTION: This exam was performed according to the
departmental dose-optimization program which includes automated
exposure control, adjustment of the mA and/or kV according to
patient size and/or use of iterative reconstruction technique.

CONTRAST:  100mL OMNIPAQUE IOHEXOL 300 MG/ML SOLN, additional oral
enteric contrast
FINDINGS: CT CHEST FINDINGS

Cardiovascular: Aortic atherosclerosis. Normal heart size. Left
coronary artery calcifications. No pericardial effusion.

Mediastinum/Nodes: No enlarged mediastinal, hilar, or axillary lymph
nodes. Thyroid gland, trachea, and esophagus demonstrate no
significant findings.

Lungs/Pleura: Lungs are clear. No pleural effusion or pneumothorax.

Musculoskeletal: No chest wall mass or suspicious osseous lesions
identified.

CT ABDOMEN PELVIS FINDINGS

Hepatobiliary: Hypodense lesion of the posterior right lobe of the
liver, hepatic segment VII, measuring 2.4 x 2.9 cm (series 2, image
53). Hepatomegaly, maximum coronal span 21.0 cm. Hepatic steatosis.
Tiny gallstones calcific sludge in the dependent gallbladder. No
gallbladder wall thickening, or biliary dilatation.

Pancreas: Unremarkable. No pancreatic ductal dilatation or
surrounding inflammatory changes.

Spleen: Normal in size without significant abnormality.

Adrenals/Urinary Tract: Adrenal glands are unremarkable. Kidneys are
normal, without renal calculi, solid lesion, or hydronephrosis.
Bladder is unremarkable.

Stomach/Bowel: Stomach is within normal limits. Appendix not clearly
visualized. Circumferential wall thickening of the rectum (series 2,
image 119). Descending and sigmoid diverticulosis.

Vascular/Lymphatic: Aortic atherosclerosis. No enlarged abdominal or
pelvic lymph nodes.

Reproductive: Prostatomegaly.

Other: Small, fat containing bilateral inguinal hernias. No ascites.

Musculoskeletal: No acute osseous findings.
IMPRESSION: 1. Circumferential wall thickening of the rectum, in keeping with
primary rectal malignancy. Correlate colonoscopy findings.
2. Lesion of the posterior right lobe of the liver, suspicious for
hepatic metastasis although incompletely characterized. Recommend
multiphasic contrast enhanced MRI to further evaluate.
3. No other evidence of lymphadenopathy or metastatic disease in the
chest, abdomen, or pelvis.
4. Hepatomegaly and hepatic steatosis.
5. Prostatomegaly.
6. Coronary artery disease.

Aortic Atherosclerosis ([id number]).

## 2022-11-16 MED ORDER — FLUTICASONE PROPIONATE 50 MCG/ACT NA SUSP: 2.0000 | Freq: Every day | NASAL | 2 refills | Status: DC

## 2022-11-16 MED ORDER — LEVOCETIRIZINE DIHYDROCHLORIDE 5 MG PO TABS: 5.0000 mg | ORAL_TABLET | Freq: Every evening | ORAL | 1 refills | Status: DC

## 2022-11-16 NOTE — Progress Notes (Signed)
Subjective:    Patient ID: [name] [name], male    DOB: 20-May-1961, 62 y.o.   MRN: [id number]  Chief Complaint  Patient presents with   Medical Management of Chronic Issues   Pt presents to the office today for  chronic follow up.   He had for Spinal Cord stimulator. He states this has been life changing.   He was diagnosed Rectal Cancer and completed chemo and radiation. He currently has an ostomy and will be reversing it June 7.   Asthma There is no cough or wheezing. This is a chronic problem. The current episode started more than 1 year ago. The problem occurs intermittently. Associated symptoms include heartburn. He reports significant improvement on treatment. His past medical history is significant for asthma.  Gastroesophageal Reflux He complains of belching and heartburn. He reports no coughing or no wheezing. This is a chronic problem. The current episode started more than 1 year ago. The problem occurs rarely. Risk factors include obesity. He has tried a PPI for the symptoms. The treatment provided moderate relief.  Back Pain This is a chronic problem. The current episode started more than 1 year ago. The problem occurs intermittently. The problem has been waxing and waning since onset. The pain is present in the lumbar spine. The quality of the pain is described as aching. The pain is at a severity of 3/10. The pain is mild. Associated symptoms include numbness. He has tried bed rest for the symptoms. The treatment provided mild relief.  Benign Prostatic Hypertrophy This is a recurrent problem. The current episode started more than 1 year ago. Irritative symptoms include nocturia (1). Past treatments include tamsulosin. The treatment provided moderate relief.  Hyperlipidemia This is a chronic problem. The current episode started more than 1 year ago. The problem is controlled. Current antihyperlipidemic treatment includes statins. The current treatment provides moderate  improvement of lipids. Risk factors for coronary artery disease include dyslipidemia, diabetes mellitus, hypertension and a sedentary lifestyle.  Diabetes He presents for his follow-up diabetic visit. He has type 2 diabetes mellitus. Associated symptoms include foot paresthesias. Pertinent negatives for diabetes include no blurred vision. Symptoms are stable. Risk factors for coronary artery disease include diabetes mellitus, dyslipidemia, male sex, hypertension and sedentary lifestyle. He is following a generally healthy diet. (Does not check BS at home ) Eye exam is current.      Review of Systems  Eyes:  Negative for blurred vision.  Respiratory:  Negative for cough and wheezing.   Gastrointestinal:  Positive for heartburn.  Genitourinary:  Positive for nocturia (1).  Musculoskeletal:  Positive for back pain.  Neurological:  Positive for numbness.  All other systems reviewed and are negative.      Objective:   Physical Exam Vitals reviewed.  Constitutional:      General: He is not in acute distress.    Appearance: He is well-developed. He is obese.  HENT:     Head: Normocephalic.     Right Ear: Tympanic membrane normal.     Left Ear: Tympanic membrane normal.  Eyes:     General:        Right eye: No discharge.        Left eye: No discharge.     Pupils: Pupils are equal, round, and reactive to light.  Neck:     Thyroid: No thyromegaly.  Cardiovascular:     Rate and Rhythm: Normal rate and regular rhythm.     Heart sounds: Normal heart sounds.  No murmur heard. Pulmonary:     Effort: Pulmonary effort is normal. No respiratory distress.     Breath sounds: Normal breath sounds. No wheezing.  Abdominal:     General: The ostomy site is clean. Bowel sounds are normal. There is no distension.     Palpations: Abdomen is soft.     Tenderness: There is no abdominal tenderness.  Musculoskeletal:        General: No tenderness. Normal range of motion.     Cervical back: Normal  range of motion and neck supple.  Skin:    General: Skin is warm and dry.     Findings: No erythema or rash.  Neurological:     Mental Status: He is alert and oriented to person, place, and time.     Cranial Nerves: No cranial nerve deficit.     Deep Tendon Reflexes: Reflexes are normal and symmetric.  Psychiatric:        Behavior: Behavior normal.        Thought Content: Thought content normal.        Judgment: Judgment normal.      BP 114/69   Pulse 84   Temp 98 F (36.7 C) (Temporal)   Ht 6' (1.829 m)   Wt 244 lb 6.4 oz (110.9 kg)   SpO2 96%   BMI 33.15 kg/m      Assessment & Plan:   [name] [name] comes in today with chief complaint of Medical Management of Chronic Issues   Diagnosis and orders addressed:  1. Mild intermittent asthma, unspecified whether complicated - CMP14+EGFR - CBC with Differential/Platelet  2. Benign prostatic hyperplasia with incomplete bladder emptying - CMP14+EGFR - CBC with Differential/Platelet  3. Chronic low back pain, unspecified back pain laterality, unspecified whether sciatica present - CMP14+EGFR - CBC with Differential/Platelet  4. Dyslipidemia - CMP14+EGFR - CBC with Differential/Platelet  5. Gastroesophageal reflux disease, unspecified whether esophagitis present - CMP14+EGFR - CBC with Differential/Platelet  6. Ileostomy present (HCC) - CMP14+EGFR - CBC with Differential/Platelet  7. Class 1 obesity due to excess calories with serious comorbidity and body mass index (BMI) of 33.0 to 33.9 in adult - CMP14+EGFR - CBC with Differential/Platelet  8. Rectal cancer (HCC) - CMP14+EGFR - CBC with Differential/Platelet  9. Diabetes mellitus with stage 3 chronic kidney disease (HCC) - Bayer DCA Hb A1c Waived - CMP14+EGFR - CBC with Differential/Platelet   Labs pending Health Maintenance reviewed Diet and exercise encouraged  Follow up plan: 4 months    Jannifer Rodney, FNP

## 2022-11-16 NOTE — Patient Instructions (Signed)
Health Maintenance, Male Adopting a healthy lifestyle and getting preventive care are important in promoting health and wellness. Ask your health care provider about: The right schedule for you to have regular tests and exams. Things you can do on your own to prevent diseases and keep yourself healthy. What should I know about diet, weight, and exercise? Eat a healthy diet  Eat a diet that includes plenty of vegetables, fruits, low-fat dairy products, and lean protein. Do not eat a lot of foods that are high in solid fats, added sugars, or sodium. Maintain a healthy weight Body mass index (BMI) is a measurement that can be used to identify possible weight problems. It estimates body fat based on height and weight. Your health care provider can help determine your BMI and help you achieve or maintain a healthy weight. Get regular exercise Get regular exercise. This is one of the most important things you can do for your health. Most adults should: Exercise for at least 150 minutes each week. The exercise should increase your heart rate and make you sweat (moderate-intensity exercise). Do strengthening exercises at least twice a week. This is in addition to the moderate-intensity exercise. Spend less time sitting. Even light physical activity can be beneficial. Watch cholesterol and blood lipids Have your blood tested for lipids and cholesterol at 63 years of age, then have this test every 5 years. You may need to have your cholesterol levels checked more often if: Your lipid or cholesterol levels are high. You are older than 63 years of age. You are at high risk for heart disease. What should I know about cancer screening? Many types of cancers can be detected early and may often be prevented. Depending on your health history and family history, you may need to have cancer screening at various ages. This may include screening for: Colorectal cancer. Prostate cancer. Skin cancer. Lung  cancer. What should I know about heart disease, diabetes, and high blood pressure? Blood pressure and heart disease High blood pressure causes heart disease and increases the risk of stroke. This is more likely to develop in people who have high blood pressure readings or are overweight. Talk with your health care provider about your target blood pressure readings. Have your blood pressure checked: Every 3-5 years if you are 18-39 years of age. Every year if you are 40 years old or older. If you are between the ages of 65 and 75 and are a current or former smoker, ask your health care provider if you should have a one-time screening for abdominal aortic aneurysm (AAA). Diabetes Have regular diabetes screenings. This checks your fasting blood sugar level. Have the screening done: Once every three years after age 45 if you are at a normal weight and have a low risk for diabetes. More often and at a younger age if you are overweight or have a high risk for diabetes. What should I know about preventing infection? Hepatitis B If you have a higher risk for hepatitis B, you should be screened for this virus. Talk with your health care provider to find out if you are at risk for hepatitis B infection. Hepatitis C Blood testing is recommended for: Everyone born from 1945 through 1965. Anyone with known risk factors for hepatitis C. Sexually transmitted infections (STIs) You should be screened each year for STIs, including gonorrhea and chlamydia, if: You are sexually active and are younger than 63 years of age. You are older than 63 years of age and your   health care provider tells you that you are at risk for this type of infection. Your sexual activity has changed since you were last screened, and you are at increased risk for chlamydia or gonorrhea. Ask your health care provider if you are at risk. Ask your health care provider about whether you are at high risk for HIV. Your health care provider  may recommend a prescription medicine to help prevent HIV infection. If you choose to take medicine to prevent HIV, you should first get tested for HIV. You should then be tested every 3 months for as long as you are taking the medicine. Follow these instructions at home: Alcohol use Do not drink alcohol if your health care provider tells you not to drink. If you drink alcohol: Limit how much you have to 0-2 drinks a day. Know how much alcohol is in your drink. In the [place] one drink equals one 12 oz bottle of beer (355 mL), one 5 oz glass of wine (148 mL), or one 1 oz glass of hard liquor (44 mL). Lifestyle Do not use any products that contain nicotine or tobacco. These products include cigarettes, chewing tobacco, and vaping devices, such as e-cigarettes. If you need help quitting, ask your health care provider. Do not use street drugs. Do not share needles. Ask your health care provider for help if you need support or information about quitting drugs. General instructions Schedule regular health, dental, and eye exams. Stay current with your vaccines. Tell your health care provider if: You often feel depressed. You have ever been abused or do not feel safe at home. Summary Adopting a healthy lifestyle and getting preventive care are important in promoting health and wellness. Follow your health care provider's instructions about healthy diet, exercising, and getting tested or screened for diseases. Follow your health care provider's instructions on monitoring your cholesterol and blood pressure. This information is not intended to replace advice given to you by your health care provider. Make sure you discuss any questions you have with your health care provider. Document Revised: 11/01/2020 Document Reviewed: 11/01/2020 Elsevier Patient Education  2023 [employer]  

## 2022-11-16 NOTE — Progress Notes (Addendum)
COVID Vaccine received:  []  No [x]  Yes Date of any COVID positive Test in last 90 days:  None  PCP - Jannifer Rodney, FNP Cardiologist -  none Oncology-  Jeanella Flattery, MD  Chest x-ray -  06-18-2022  1v     CT chest 06-18-2022  Epic EKG -  06-21-2022  Epic Stress Test -  ECHO -  Cardiac Cath -   PCR screen: []  Ordered & Completed           [x]   No Order but Needs PROFEND           []   N/A for this surgery  Surgery Plan:  []  Ambulatory                            []  Outpatient in bed                            [x]  Admit  Anesthesia:    [x]  General  []  Spinal                           []   Choice []   MAC  Bowel Prep - [x]  No  []   Yes ______  Pacemaker / ICD device [x]  No []  Yes   Spinal Cord Stimulator:[]  No [x]  Yes  Patient to bring remote DOS     History of Sleep Apnea? [x]  No []  Yes   CPAP used?- [x]  No []  Yes    Does the patient monitor blood sugar?          []  No [x]  Yes  []  N/A  Patient has: []  NO Hx DM   []  Pre-DM                 []  DM1  [x]   DM2 Does patient have a [employer] or Dexacom? []  No [x]  Yes   Fasting Blood Sugar Ranges- 120-140  Checks Blood Sugar _continuous  times a day  GLP1 agonist / usual dose - Trulicity injections on Sundays GLP1 instructions: Hold x 7 days prior to surgery, Last Dose: 11-19-2022 SGLT-2 inhibitors / usual dose - Jardiance  25 mg w/ breakfast SGLT-2 instructions: Hold 72 hours prior to surgery.  Last dose: Tuesday 11-21-2022  Other Diabetic medications/ instructions: Metformin:  500mg  bid.. Patient will hold the DOS.   Blood Thinner / Instructions:none Aspirin Instructions:  none  ERAS Protocol Ordered: [x]  No  []  Yes Patient is to be NPO after:  Midnight prior  Comments: Patient had CBC diff, CMP14+ and Hgb A1c on 11-16-2022 by Jannifer Rodney, NP at his regular 6 months visit. These are in Epic.   Activity level: Patient is able to climb a flight of stairs without difficulty; [x]  No CP  [x]  No SOB, but would have ___    Patient can perform ADLs without assistance.   Anesthesia review: DM2, CKD3, asthma, s/p LAR for Rectal Ca 09-01-2022, GERD, PONV  Patient denies shortness of breath, fever, cough and chest pain at PAT appointment.  Patient verbalized understanding and agreement to the Pre-Surgical Instructions that were given to them at this PAT appointment. Patient was also educated of the need to review these PAT instructions again prior to his surgery.I reviewed the appropriate phone numbers to call if they have any and questions or concerns.

## 2022-11-16 NOTE — Patient Instructions (Addendum)
SURGICAL WAITING ROOM VISITATION Patients having surgery or a procedure may have no more than 2 support people in the waiting area - these visitors may rotate in the visitor waiting room.   Due to an increase in RSV and influenza rates and associated hospitalizations, children ages 82 and under may not visit patients in [facility] hospitals. If the patient needs to stay at the hospital during part of their recovery, the visitor guidelines for inpatient rooms apply.  PRE-OP VISITATION  Pre-op nurse will coordinate an appropriate time for 1 support person to accompany the patient in pre-op.  This support person may not rotate.  This visitor will be contacted when the time is appropriate for the visitor to come back in the pre-op area.  Please refer to the [facility] website for the visitor guidelines for Inpatients (after your surgery is over and you are in a regular room).  You are not required to quarantine at this time prior to your surgery. However, you must do this: Hand Hygiene often Do NOT share personal items Notify your provider if you are in close contact with someone who has COVID or you develop fever 100.4 or greater, new onset of sneezing, cough, sore throat, shortness of breath or body aches.  If you test positive for Covid or have been in contact with anyone that has tested positive in the last 10 days please notify you surgeon.    Your procedure is scheduled on:  Friday  December 01, 2022  Report to [facility] Main Entrance: [place] entrance where the [employer] is available.   Report to admitting at: 05:15    AM  +++++Call this number if you have any questions or problems the morning of surgery [phone]  DO NOT EAT OR DRINK ANYTHING AFTER MIDNIGHT THE NIGHT PRIOR TO YOUR SURGERY / PROCEDURE.   FOLLOW BOWEL PREP AND ANY ADDITIONAL PRE OP INSTRUCTIONS YOU RECEIVED FROM YOUR SURGEON'S OFFICE!!!   Oral Hygiene is also important to reduce your risk of infection.         Remember - BRUSH YOUR TEETH THE MORNING OF SURGERY WITH YOUR REGULAR TOOTHPASTE  Do NOT smoke after Midnight the night before surgery.  Diabetic Medications:   GLP1 agonist / usual dose - Trulicity injections on Sundays GLP1 instructions: Hold x 7 days prior to surgery, Last Dose: 11-19-2022  SGLT-2 inhibitors / usual dose - Jardiance  25 mg w/ breakfast SGLT-2 instructions: Hold 72 hours prior to surgery.  Last dose: Tuesday 11-21-2022  Other Diabetic medications/ instructions: Metformin:   DO NOT take Metformin the morning of Surgery.    Take ONLY these medicines the morning of surgery with A SIP OF WATER: Tamsulosin, (Flomax), omeprazole (Prilosec). You may use your Flonase nasal spray if needed.                     You may not have any metal on your body including  jewelry, and body piercing  Do not wear lotions, powders,  cologne, or deodorant  Men may shave face and neck.  Contacts, Hearing Aids, dentures or bridgework may not be worn into surgery. DENTURES WILL BE REMOVED PRIOR TO SURGERY PLEASE DO NOT APPLY "Poly grip" OR ADHESIVES!!!  You may bring a small overnight bag with you on the day of surgery, only pack items that are not valuable. [facility] IS NOT RESPONSIBLE   FOR VALUABLES THAT ARE LOST OR STOLEN.   Do not bring your home medications  to the hospital. The Pharmacy will dispense medications listed on your medication list to you during your admission in the Hospital.  Special Instructions: Bring a copy of your healthcare power of attorney and living will documents the day of surgery, if you wish to have them scanned into your [facility] Records- EPIC  Please read over the following fact sheets you were given: IF YOU HAVE QUESTIONS ABOUT YOUR PRE-OP INSTRUCTIONS, PLEASE CALL [phone].   [facility] - Preparing for Surgery Before surgery, you can play an important role.  Because skin is not sterile, your skin needs to be as free of germs as  possible.  You can reduce the number of germs on your skin by washing with CHG (chlorahexidine gluconate) soap before surgery.  CHG is an antiseptic cleaner which kills germs and bonds with the skin to continue killing germs even after washing. Please DO NOT use if you have an allergy to CHG or antibacterial soaps.  If your skin becomes reddened/irritated stop using the CHG and inform your nurse when you arrive at Short Stay. Do not shave (including legs and underarms) for at least 48 hours prior to the first CHG shower.  You may shave your face/neck.  Please follow these instructions carefully:  1.  Shower with CHG Soap the night before surgery and the  morning of surgery.  2.  If you choose to wash your hair, wash your hair first as usual with your normal  shampoo.  3.  After you shampoo, rinse your hair and body thoroughly to remove the shampoo.                             4.  Use CHG as you would any other liquid soap.  You can apply chg directly to the skin and wash.  Gently with a scrungie or clean washcloth.  5.  Apply the CHG Soap to your body ONLY FROM THE NECK DOWN.   Do not use on face/ open                           Wound or open sores. Avoid contact with eyes, ears mouth and genitals (private parts).                       Wash face,  Genitals (private parts) with your normal soap.             6.  Wash thoroughly, paying special attention to the area where your  surgery  will be performed.  7.  Thoroughly rinse your body with warm water from the neck down.  8.  DO NOT shower/wash with your normal soap after using and rinsing off the CHG Soap.            9.  Pat yourself dry with a clean towel.            10.  Wear clean pajamas.            11.  Place clean sheets on your bed the night of your first shower and do not  sleep with pets.  ON THE DAY OF SURGERY : Do not apply any lotions/deodorants the morning of surgery.  Please wear clean clothes to the hospital/surgery  center.    FAILURE TO FOLLOW THESE INSTRUCTIONS MAY RESULT IN THE CANCELLATION OF YOUR SURGERY  PATIENT SIGNATURE_________________________________  NURSE SIGNATURE__________________________________  ________________________________________________________________________   

## 2022-11-17 ENCOUNTER — Encounter (HOSPITAL_COMMUNITY)
Admission: RE | Admit: 2022-11-17 | Discharge: 2022-11-17 | Disposition: A | Discharge status: Home / Self Care | Payer: Commercial Managed Care - PPO | Source: Ambulatory Visit | Attending: Surgery | Admitting: Surgery

## 2022-11-17 ENCOUNTER — Encounter (HOSPITAL_COMMUNITY): Payer: Self-pay

## 2022-11-17 ENCOUNTER — Other Ambulatory Visit: Payer: Self-pay

## 2022-11-17 VITALS — BP 110/70 | HR 85 | Temp 98.1°F | Resp 16 | Ht 72.0 in | Wt 244.0 lb

## 2022-11-17 DIAGNOSIS — Z01818 Encounter for other preprocedural examination: Secondary | ICD-10-CM | POA: Diagnosis present

## 2022-11-17 DIAGNOSIS — E119 Type 2 diabetes mellitus without complications: Secondary | ICD-10-CM

## 2022-11-30 NOTE — Anesthesia Preprocedure Evaluation (Addendum)
Anesthesia Evaluation  Patient identified by MRN, date of birth, ID band Patient awake    Reviewed: Allergy & Precautions, NPO status , Patient's Chart, lab work & pertinent test results  History of Anesthesia Complications (+) PONV and history of anesthetic complications  Airway Mallampati: II  TM Distance: >3 FB Neck ROM: Full    Dental  (+) Dental Advisory Given, Teeth Intact   Pulmonary asthma , former smoker   Pulmonary exam normal        Cardiovascular negative cardio ROS Normal cardiovascular exam     Neuro/Psych  Headaches  negative psych ROS   GI/Hepatic Neg liver ROS,GERD  Medicated and Controlled,, Rectal cancer    Endo/Other  diabetes, Type 2, Oral Hypoglycemic Agents   Obesity   Renal/GU negative Renal ROS     Musculoskeletal  (+) Arthritis ,    Abdominal   Peds  Hematology negative hematology ROS (+)   Anesthesia Other Findings On GLP1-a   Reproductive/Obstetrics                             Anesthesia Physical Anesthesia Plan  ASA: 3  Anesthesia Plan: General   Post-op Pain Management: Tylenol PO (pre-op)*   Induction: Intravenous  PONV Risk Score and Plan: 3 and Treatment may vary due to age or medical condition, Ondansetron, Scopolamine patch - Pre-op, Midazolam and Dexamethasone  Airway Management Planned: Oral ETT  Additional Equipment: None  Intra-op Plan:   Post-operative Plan: Extubation in OR  Informed Consent: I have reviewed the patients History and Physical, chart, labs and discussed the procedure including the risks, benefits and alternatives for the proposed anesthesia with the patient or authorized representative who has indicated his/her understanding and acceptance.     Dental advisory given  Plan Discussed with: CRNA and Anesthesiologist  Anesthesia Plan Comments:         Anesthesia Quick Evaluation

## 2022-12-01 ENCOUNTER — Encounter (HOSPITAL_COMMUNITY)
Admission: RE | Disposition: A | Discharge status: Home / Self Care | Payer: Self-pay | Source: Home / Self Care | Attending: Surgery

## 2022-12-01 ENCOUNTER — Inpatient Hospital Stay (HOSPITAL_COMMUNITY)
Admission: RE | Admit: 2022-12-01 | Discharge: 2022-12-03 | DRG: 331 | Disposition: A | Discharge status: Home / Self Care | Payer: Commercial Managed Care - PPO | Attending: Surgery | Admitting: Surgery

## 2022-12-01 ENCOUNTER — Other Ambulatory Visit (HOSPITAL_COMMUNITY): Payer: Self-pay

## 2022-12-01 ENCOUNTER — Inpatient Hospital Stay (HOSPITAL_COMMUNITY): Payer: Commercial Managed Care - PPO | Admitting: Anesthesiology

## 2022-12-01 ENCOUNTER — Other Ambulatory Visit: Payer: Self-pay

## 2022-12-01 ENCOUNTER — Encounter (HOSPITAL_COMMUNITY): Payer: Self-pay | Admitting: Surgery

## 2022-12-01 DIAGNOSIS — R3914 Feeling of incomplete bladder emptying: Secondary | ICD-10-CM | POA: Diagnosis present

## 2022-12-01 DIAGNOSIS — Z87891 Personal history of nicotine dependence: Secondary | ICD-10-CM | POA: Diagnosis not present

## 2022-12-01 DIAGNOSIS — Z83719 Family history of colon polyps, unspecified: Secondary | ICD-10-CM

## 2022-12-01 DIAGNOSIS — I129 Hypertensive chronic kidney disease with stage 1 through stage 4 chronic kidney disease, or unspecified chronic kidney disease: Secondary | ICD-10-CM | POA: Diagnosis present

## 2022-12-01 DIAGNOSIS — Z800 Family history of malignant neoplasm of digestive organs: Secondary | ICD-10-CM

## 2022-12-01 DIAGNOSIS — E1122 Type 2 diabetes mellitus with diabetic chronic kidney disease: Secondary | ICD-10-CM | POA: Diagnosis present

## 2022-12-01 DIAGNOSIS — Z9109 Other allergy status, other than to drugs and biological substances: Secondary | ICD-10-CM | POA: Diagnosis not present

## 2022-12-01 DIAGNOSIS — Z432 Encounter for attention to ileostomy: Secondary | ICD-10-CM

## 2022-12-01 DIAGNOSIS — M5136 Other intervertebral disc degeneration, lumbar region: Secondary | ICD-10-CM | POA: Diagnosis present

## 2022-12-01 DIAGNOSIS — Z7984 Long term (current) use of oral hypoglycemic drugs: Secondary | ICD-10-CM

## 2022-12-01 DIAGNOSIS — K8020 Calculus of gallbladder without cholecystitis without obstruction: Secondary | ICD-10-CM | POA: Diagnosis present

## 2022-12-01 DIAGNOSIS — E785 Hyperlipidemia, unspecified: Secondary | ICD-10-CM | POA: Diagnosis present

## 2022-12-01 DIAGNOSIS — E119 Type 2 diabetes mellitus without complications: Secondary | ICD-10-CM

## 2022-12-01 DIAGNOSIS — Z01818 Encounter for other preprocedural examination: Secondary | ICD-10-CM

## 2022-12-01 DIAGNOSIS — Z981 Arthrodesis status: Secondary | ICD-10-CM

## 2022-12-01 DIAGNOSIS — Z86010 Personal history of colonic polyps: Secondary | ICD-10-CM | POA: Diagnosis not present

## 2022-12-01 DIAGNOSIS — Z8505 Personal history of malignant neoplasm of liver: Secondary | ICD-10-CM

## 2022-12-01 DIAGNOSIS — G8929 Other chronic pain: Secondary | ICD-10-CM | POA: Diagnosis present

## 2022-12-01 DIAGNOSIS — Z833 Family history of diabetes mellitus: Secondary | ICD-10-CM

## 2022-12-01 DIAGNOSIS — N183 Chronic kidney disease, stage 3 unspecified: Secondary | ICD-10-CM | POA: Diagnosis present

## 2022-12-01 DIAGNOSIS — N401 Enlarged prostate with lower urinary tract symptoms: Secondary | ICD-10-CM | POA: Diagnosis present

## 2022-12-01 DIAGNOSIS — E669 Obesity, unspecified: Secondary | ICD-10-CM | POA: Diagnosis present

## 2022-12-01 DIAGNOSIS — Z79899 Other long term (current) drug therapy: Secondary | ICD-10-CM

## 2022-12-01 DIAGNOSIS — M1990 Unspecified osteoarthritis, unspecified site: Secondary | ICD-10-CM | POA: Diagnosis present

## 2022-12-01 DIAGNOSIS — J45909 Unspecified asthma, uncomplicated: Secondary | ICD-10-CM | POA: Diagnosis present

## 2022-12-01 DIAGNOSIS — E8881 Metabolic syndrome: Secondary | ICD-10-CM | POA: Diagnosis present

## 2022-12-01 DIAGNOSIS — Z9889 Other specified postprocedural states: Principal | ICD-10-CM | POA: Diagnosis present

## 2022-12-01 DIAGNOSIS — Z803 Family history of malignant neoplasm of breast: Secondary | ICD-10-CM

## 2022-12-01 DIAGNOSIS — K219 Gastro-esophageal reflux disease without esophagitis: Secondary | ICD-10-CM | POA: Diagnosis present

## 2022-12-01 DIAGNOSIS — Z85048 Personal history of other malignant neoplasm of rectum, rectosigmoid junction, and anus: Secondary | ICD-10-CM

## 2022-12-01 DIAGNOSIS — C787 Secondary malignant neoplasm of liver and intrahepatic bile duct: Secondary | ICD-10-CM | POA: Diagnosis present

## 2022-12-01 DIAGNOSIS — C20 Malignant neoplasm of rectum: Secondary | ICD-10-CM | POA: Diagnosis present

## 2022-12-01 DIAGNOSIS — Z91048 Other nonmedicinal substance allergy status: Secondary | ICD-10-CM

## 2022-12-01 HISTORY — PX: ILEOSTOMY CLOSURE: SHX1784

## 2022-12-01 LAB — GLUCOSE, CAPILLARY
Glucose-Capillary: 178 mg/dL — ABNORMAL HIGH (ref 70–99)
Glucose-Capillary: 207 mg/dL — ABNORMAL HIGH (ref 70–99)
Glucose-Capillary: 238 mg/dL — ABNORMAL HIGH (ref 70–99)
Glucose-Capillary: 236 mg/dL — ABNORMAL HIGH (ref 70–99)
Glucose-Capillary: 233 mg/dL — ABNORMAL HIGH (ref 70–99)
Glucose-Capillary: 210 mg/dL — ABNORMAL HIGH (ref 70–99)

## 2022-12-01 SURGERY — CLOSURE, ILEOSTOMY
Anesthesia: General | Site: Abdomen

## 2022-12-01 MED ORDER — ROSUVASTATIN CALCIUM 10 MG PO TABS
10.0000 mg | ORAL_TABLET | Freq: Every day | ORAL | Status: DC
  Administered 2022-12-01 – 2022-12-03 (×3): 10 mg via ORAL
  Filled 2022-12-01 (×3): qty 1

## 2022-12-01 MED ORDER — MIDAZOLAM HCL 2 MG/2ML IJ SOLN
INTRAMUSCULAR | Status: DC | PRN
  Administered 2022-12-01: 2 mg via INTRAVENOUS

## 2022-12-01 MED ORDER — SODIUM CHLORIDE 0.9 % IV SOLN
2.0000 g | INTRAVENOUS | Status: AC
  Administered 2022-12-01: 2 g via INTRAVENOUS
  Filled 2022-12-01: qty 2

## 2022-12-01 MED ORDER — ALVIMOPAN 12 MG PO CAPS
12.0000 mg | ORAL_CAPSULE | ORAL | Status: AC
  Administered 2022-12-01: 12 mg via ORAL
  Filled 2022-12-01: qty 1

## 2022-12-01 MED ORDER — LIDOCAINE HCL (PF) 2 % IJ SOLN
INTRAMUSCULAR | Status: AC
  Filled 2022-12-01: qty 10

## 2022-12-01 MED ORDER — INSULIN ASPART 100 UNIT/ML IJ SOLN
INTRAMUSCULAR | Status: AC
  Filled 2022-12-01: qty 1

## 2022-12-01 MED ORDER — MIDAZOLAM HCL 2 MG/2ML IJ SOLN
INTRAMUSCULAR | Status: AC
  Filled 2022-12-01: qty 2

## 2022-12-01 MED ORDER — ROCURONIUM BROMIDE 10 MG/ML (PF) SYRINGE
PREFILLED_SYRINGE | INTRAVENOUS | Status: AC
  Filled 2022-12-01: qty 10

## 2022-12-01 MED ORDER — LACTATED RINGERS IV SOLN: INTRAVENOUS | Status: DC

## 2022-12-01 MED ORDER — ACETAMINOPHEN 500 MG PO TABS
1000.0000 mg | ORAL_TABLET | Freq: Four times a day (QID) | ORAL | Status: DC
  Administered 2022-12-01 – 2022-12-03 (×7): 1000 mg via ORAL
  Filled 2022-12-01 (×8): qty 2

## 2022-12-01 MED ORDER — DEXMEDETOMIDINE HCL IN NACL 80 MCG/20ML IV SOLN
INTRAVENOUS | Status: DC | PRN
  Administered 2022-12-01: 4 ug via INTRAVENOUS
  Administered 2022-12-01 (×2): 8 ug via INTRAVENOUS

## 2022-12-01 MED ORDER — PROPOFOL 10 MG/ML IV BOLUS
INTRAVENOUS | Status: AC
  Filled 2022-12-01: qty 20

## 2022-12-01 MED ORDER — ONDANSETRON HCL 4 MG/2ML IJ SOLN
INTRAMUSCULAR | Status: AC
  Filled 2022-12-01: qty 2

## 2022-12-01 MED ORDER — FLUTICASONE PROPIONATE 50 MCG/ACT NA SUSP
2.0000 | Freq: Every day | NASAL | Status: DC
  Administered 2022-12-01 – 2022-12-03 (×3): 2 via NASAL
  Filled 2022-12-01: qty 16

## 2022-12-01 MED ORDER — ONDANSETRON HCL 4 MG PO TABS: 4.0000 mg | ORAL_TABLET | Freq: Four times a day (QID) | ORAL | Status: DC | PRN

## 2022-12-01 MED ORDER — 0.9 % SODIUM CHLORIDE (POUR BTL) OPTIME
TOPICAL | Status: DC | PRN
  Administered 2022-12-01: 2000 mL

## 2022-12-01 MED ORDER — ONDANSETRON HCL 4 MG/2ML IJ SOLN
INTRAMUSCULAR | Status: DC | PRN
  Administered 2022-12-01: 4 mg via INTRAVENOUS

## 2022-12-01 MED ORDER — LORAZEPAM 0.5 MG PO TABS
0.5000 mg | ORAL_TABLET | Freq: Three times a day (TID) | ORAL | Status: DC | PRN
  Administered 2022-12-01 – 2022-12-03 (×2): 0.5 mg via ORAL
  Filled 2022-12-01 (×2): qty 1

## 2022-12-01 MED ORDER — DEXAMETHASONE SODIUM PHOSPHATE 10 MG/ML IJ SOLN
INTRAMUSCULAR | Status: AC
  Filled 2022-12-01: qty 1

## 2022-12-01 MED ORDER — LIDOCAINE 2% (20 MG/ML) 5 ML SYRINGE
INTRAMUSCULAR | Status: DC | PRN
  Administered 2022-12-01: 1.5 mg/kg/h via INTRAVENOUS
  Administered 2022-12-01: 60 mg via INTRAVENOUS

## 2022-12-01 MED ORDER — KETAMINE HCL 50 MG/5ML IJ SOSY
PREFILLED_SYRINGE | INTRAMUSCULAR | Status: AC
  Filled 2022-12-01: qty 5

## 2022-12-01 MED ORDER — FENTANYL CITRATE (PF) 250 MCG/5ML IJ SOLN
INTRAMUSCULAR | Status: DC | PRN
  Administered 2022-12-01: 100 ug via INTRAVENOUS
  Administered 2022-12-01: 50 ug via INTRAVENOUS
  Administered 2022-12-01: 100 ug via INTRAVENOUS
  Administered 2022-12-01 (×2): 50 ug via INTRAVENOUS

## 2022-12-01 MED ORDER — KETAMINE HCL 10 MG/ML IJ SOLN
INTRAMUSCULAR | Status: DC | PRN
  Administered 2022-12-01: 20 mg via INTRAVENOUS
  Administered 2022-12-01: 30 mg via INTRAVENOUS

## 2022-12-01 MED ORDER — OXYCODONE HCL 5 MG PO TABS
5.0000 mg | ORAL_TABLET | Freq: Once | ORAL | Status: AC | PRN
  Administered 2022-12-01: 5 mg via ORAL

## 2022-12-01 MED ORDER — ACETAMINOPHEN 500 MG PO TABS: 1000.0000 mg | ORAL_TABLET | Freq: Once | ORAL | Status: DC

## 2022-12-01 MED ORDER — SIMETHICONE 80 MG PO CHEW
40.0000 mg | CHEWABLE_TABLET | Freq: Four times a day (QID) | ORAL | Status: DC | PRN
  Administered 2022-12-01 – 2022-12-02 (×2): 40 mg via ORAL
  Filled 2022-12-01 (×2): qty 1

## 2022-12-01 MED ORDER — HEPARIN SODIUM (PORCINE) 5000 UNIT/ML IJ SOLN
5000.0000 [IU] | Freq: Three times a day (TID) | INTRAMUSCULAR | Status: DC
  Administered 2022-12-01 – 2022-12-03 (×5): 5000 [IU] via SUBCUTANEOUS
  Filled 2022-12-01 (×5): qty 1

## 2022-12-01 MED ORDER — HYDRALAZINE HCL 20 MG/ML IJ SOLN: 10.0000 mg | INTRAMUSCULAR | Status: DC | PRN

## 2022-12-01 MED ORDER — ALUM & MAG HYDROXIDE-SIMETH 200-200-20 MG/5ML PO SUSP: 30.0000 mL | Freq: Four times a day (QID) | ORAL | Status: DC | PRN

## 2022-12-01 MED ORDER — PROPOFOL 10 MG/ML IV BOLUS
INTRAVENOUS | Status: DC | PRN
  Administered 2022-12-01: 200 mg via INTRAVENOUS

## 2022-12-01 MED ORDER — DIPHENHYDRAMINE HCL 12.5 MG/5ML PO ELIX: 12.5000 mg | ORAL_SOLUTION | Freq: Four times a day (QID) | ORAL | Status: DC | PRN

## 2022-12-01 MED ORDER — FENTANYL CITRATE (PF) 100 MCG/2ML IJ SOLN
INTRAMUSCULAR | Status: AC
  Filled 2022-12-01: qty 2

## 2022-12-01 MED ORDER — FENTANYL CITRATE PF 50 MCG/ML IJ SOSY
PREFILLED_SYRINGE | INTRAMUSCULAR | Status: AC
  Filled 2022-12-01: qty 1

## 2022-12-01 MED ORDER — BUPIVACAINE-EPINEPHRINE 0.25% -1:200000 IJ SOLN
INTRAMUSCULAR | Status: AC
  Filled 2022-12-01: qty 1

## 2022-12-01 MED ORDER — LABETALOL HCL 5 MG/ML IV SOLN
INTRAVENOUS | Status: DC | PRN
  Administered 2022-12-01: 5 mg via INTRAVENOUS

## 2022-12-01 MED ORDER — LORATADINE 10 MG PO TABS
10.0000 mg | ORAL_TABLET | Freq: Every evening | ORAL | Status: DC
  Administered 2022-12-01 – 2022-12-02 (×2): 10 mg via ORAL
  Filled 2022-12-01 (×2): qty 1

## 2022-12-01 MED ORDER — ACETAMINOPHEN 500 MG PO TABS
1000.0000 mg | ORAL_TABLET | ORAL | Status: AC
  Administered 2022-12-01: 1000 mg via ORAL
  Filled 2022-12-01: qty 2

## 2022-12-01 MED ORDER — BUPIVACAINE LIPOSOME 1.3 % IJ SUSP: 20.0000 mL | Freq: Once | INTRAMUSCULAR | Status: DC

## 2022-12-01 MED ORDER — FENTANYL CITRATE PF 50 MCG/ML IJ SOSY
25.0000 ug | PREFILLED_SYRINGE | INTRAMUSCULAR | Status: DC | PRN
  Administered 2022-12-01 (×2): 50 ug via INTRAVENOUS

## 2022-12-01 MED ORDER — OXYCODONE HCL 5 MG/5ML PO SOLN: 5.0000 mg | Freq: Once | ORAL | Status: AC | PRN

## 2022-12-01 MED ORDER — TRAMADOL HCL 50 MG PO TABS
50.0000 mg | ORAL_TABLET | Freq: Four times a day (QID) | ORAL | Status: DC | PRN
  Administered 2022-12-01 – 2022-12-02 (×6): 50 mg via ORAL
  Filled 2022-12-01 (×6): qty 1

## 2022-12-01 MED ORDER — ONDANSETRON HCL 4 MG/2ML IJ SOLN
4.0000 mg | Freq: Four times a day (QID) | INTRAMUSCULAR | Status: DC | PRN
  Administered 2022-12-01: 4 mg via INTRAVENOUS
  Filled 2022-12-01 (×2): qty 2

## 2022-12-01 MED ORDER — DEXMEDETOMIDINE HCL IN NACL 80 MCG/20ML IV SOLN
INTRAVENOUS | Status: AC
  Filled 2022-12-01: qty 20

## 2022-12-01 MED ORDER — BUPIVACAINE LIPOSOME 1.3 % IJ SUSP
INTRAMUSCULAR | Status: AC
  Filled 2022-12-01: qty 20

## 2022-12-01 MED ORDER — LIDOCAINE HCL (PF) 2 % IJ SOLN
INTRAMUSCULAR | Status: AC
  Filled 2022-12-01: qty 5

## 2022-12-01 MED ORDER — FENTANYL CITRATE (PF) 250 MCG/5ML IJ SOLN
INTRAMUSCULAR | Status: AC
  Filled 2022-12-01: qty 5

## 2022-12-01 MED ORDER — INSULIN ASPART 100 UNIT/ML IJ SOLN
5.0000 [IU] | Freq: Once | INTRAMUSCULAR | Status: AC
  Administered 2022-12-01: 5 [IU] via SUBCUTANEOUS

## 2022-12-01 MED ORDER — DIPHENHYDRAMINE HCL 50 MG/ML IJ SOLN: 12.5000 mg | Freq: Four times a day (QID) | INTRAMUSCULAR | Status: DC | PRN

## 2022-12-01 MED ORDER — INSULIN ASPART 100 UNIT/ML IJ SOLN
0.0000 [IU] | Freq: Three times a day (TID) | INTRAMUSCULAR | Status: DC
  Administered 2022-12-01 (×3): 5 [IU] via SUBCUTANEOUS
  Administered 2022-12-02 – 2022-12-03 (×3): 3 [IU] via SUBCUTANEOUS

## 2022-12-01 MED ORDER — HYDROMORPHONE HCL 1 MG/ML IJ SOLN
0.5000 mg | INTRAMUSCULAR | Status: DC | PRN
  Administered 2022-12-01 – 2022-12-02 (×4): 0.5 mg via INTRAVENOUS
  Filled 2022-12-01 (×4): qty 0.5

## 2022-12-01 MED ORDER — BUPIVACAINE-EPINEPHRINE (PF) 0.25% -1:200000 IJ SOLN
INTRAMUSCULAR | Status: DC | PRN
  Administered 2022-12-01: 60 mL

## 2022-12-01 MED ORDER — PANTOPRAZOLE SODIUM 40 MG PO TBEC
40.0000 mg | DELAYED_RELEASE_TABLET | Freq: Every day | ORAL | Status: DC
  Administered 2022-12-02 – 2022-12-03 (×2): 40 mg via ORAL
  Filled 2022-12-01 (×2): qty 1

## 2022-12-01 MED ORDER — ORAL CARE MOUTH RINSE: 15.0000 mL | Freq: Once | OROMUCOSAL | Status: DC

## 2022-12-01 MED ORDER — DEXAMETHASONE SODIUM PHOSPHATE 10 MG/ML IJ SOLN
INTRAMUSCULAR | Status: DC | PRN
  Administered 2022-12-01: 10 mg via INTRAVENOUS

## 2022-12-01 MED ORDER — SUGAMMADEX SODIUM 200 MG/2ML IV SOLN
INTRAVENOUS | Status: DC | PRN
  Administered 2022-12-01: 120 mg via INTRAVENOUS

## 2022-12-01 MED ORDER — INSULIN ASPART 100 UNIT/ML IJ SOLN
0.0000 [IU] | Freq: Every day | INTRAMUSCULAR | Status: DC
  Administered 2022-12-01: 2 [IU] via SUBCUTANEOUS

## 2022-12-01 MED ORDER — SCOPOLAMINE 1 MG/3DAYS TD PT72
1.0000 | MEDICATED_PATCH | TRANSDERMAL | Status: DC
  Administered 2022-12-01: 1.5 mg via TRANSDERMAL
  Filled 2022-12-01: qty 1

## 2022-12-01 MED ORDER — TRAMADOL HCL 50 MG PO TABS
50.0000 mg | ORAL_TABLET | Freq: Four times a day (QID) | ORAL | 0 refills | Status: DC | PRN
  Filled 2022-12-01: qty 15, 4d supply, fill #0

## 2022-12-01 MED ORDER — OXYCODONE HCL 5 MG PO TABS
ORAL_TABLET | ORAL | Status: AC
  Filled 2022-12-01: qty 1

## 2022-12-01 MED ORDER — ROCURONIUM BROMIDE 10 MG/ML (PF) SYRINGE
PREFILLED_SYRINGE | INTRAVENOUS | Status: DC | PRN
  Administered 2022-12-01: 10 mg via INTRAVENOUS
  Administered 2022-12-01: 50 mg via INTRAVENOUS

## 2022-12-01 MED ORDER — TAMSULOSIN HCL 0.4 MG PO CAPS
0.8000 mg | ORAL_CAPSULE | Freq: Every day | ORAL | Status: DC
  Administered 2022-12-02 – 2022-12-03 (×2): 0.8 mg via ORAL
  Filled 2022-12-01 (×2): qty 2

## 2022-12-01 MED ORDER — MONTELUKAST SODIUM 10 MG PO TABS
10.0000 mg | ORAL_TABLET | Freq: Every day | ORAL | Status: DC
  Administered 2022-12-01 – 2022-12-02 (×2): 10 mg via ORAL
  Filled 2022-12-01 (×2): qty 1

## 2022-12-01 MED ORDER — IBUPROFEN 400 MG PO TABS
600.0000 mg | ORAL_TABLET | Freq: Four times a day (QID) | ORAL | Status: DC | PRN
  Filled 2022-12-01: qty 1

## 2022-12-01 MED ORDER — CHLORHEXIDINE GLUCONATE 0.12 % MT SOLN: 15.0000 mL | Freq: Once | OROMUCOSAL | Status: DC

## 2022-12-01 MED ORDER — ALVIMOPAN 12 MG PO CAPS
12.0000 mg | ORAL_CAPSULE | Freq: Two times a day (BID) | ORAL | Status: DC
  Filled 2022-12-01: qty 1

## 2022-12-01 MED ORDER — LABETALOL HCL 5 MG/ML IV SOLN
INTRAVENOUS | Status: AC
  Filled 2022-12-01: qty 4

## 2022-12-01 MED ORDER — PROMETHAZINE HCL 25 MG/ML IJ SOLN: 6.2500 mg | INTRAMUSCULAR | Status: DC | PRN

## 2022-12-01 SURGICAL SUPPLY — 49 items
APL PRP STRL LF DISP 70% ISPRP (MISCELLANEOUS) ×1
BAG COUNTER SPONGE SURGICOUNT (BAG) IMPLANT
BAG SPNG CNTER NS LX DISP (BAG)
BLADE HEX COATED 2.75 (ELECTRODE) ×1 IMPLANT
CHLORAPREP W/TINT 26 (MISCELLANEOUS) ×1 IMPLANT
COVER MAYO STAND STRL (DRAPES) ×1 IMPLANT
DRAPE LAPAROSCOPIC ABDOMINAL (DRAPES) ×1 IMPLANT
DRAPE UTILITY XL STRL (DRAPES) IMPLANT
DRAPE WARM FLUID 44X44 (DRAPES) ×1 IMPLANT
DRSG OPSITE POSTOP 4X10 (GAUZE/BANDAGES/DRESSINGS) IMPLANT
DRSG OPSITE POSTOP 4X6 (GAUZE/BANDAGES/DRESSINGS) IMPLANT
DRSG OPSITE POSTOP 4X8 (GAUZE/BANDAGES/DRESSINGS) IMPLANT
DRSG TELFA 3X8 NADH STRL (GAUZE/BANDAGES/DRESSINGS) IMPLANT
ELECT REM PT RETURN 15FT ADLT (MISCELLANEOUS) ×1 IMPLANT
GAUZE SPONGE 4X4 12PLY STRL (GAUZE/BANDAGES/DRESSINGS) ×1 IMPLANT
GLOVE BIOGEL PI IND STRL 7.0 (GLOVE) ×1 IMPLANT
GLOVE SURG SS PI 7.0 STRL IVOR (GLOVE) ×1 IMPLANT
GOWN STRL REUS W/ TWL XL LVL3 (GOWN DISPOSABLE) IMPLANT
GOWN STRL REUS W/TWL XL LVL3 (GOWN DISPOSABLE)
HANDLE SUCTION POOLE (INSTRUMENTS) ×1 IMPLANT
HOLDER FOLEY CATH W/STRAP (MISCELLANEOUS) IMPLANT
KIT BASIN OR (CUSTOM PROCEDURE TRAY) ×1 IMPLANT
KIT TURNOVER KIT A (KITS) IMPLANT
MANIFOLD NEPTUNE II (INSTRUMENTS) ×1 IMPLANT
PACK GENERAL/GYN (CUSTOM PROCEDURE TRAY) ×1 IMPLANT
RELOAD PROXIMATE 75MM BLUE (ENDOMECHANICALS) ×2 IMPLANT
RELOAD STAPLE 75 3.8 BLU REG (ENDOMECHANICALS) IMPLANT
STAPLER GUN LINEAR PROX 60 (STAPLE) IMPLANT
STAPLER PROXIMATE 75MM BLUE (STAPLE) IMPLANT
SUCTION POOLE HANDLE (INSTRUMENTS) ×1
SUT NOVA NAB DX-16 0-1 5-0 T12 (SUTURE) IMPLANT
SUT NOVA NAB GS-21 0 18 T12 DT (SUTURE) ×2 IMPLANT
SUT PROLENE 2 0 BLUE (SUTURE) IMPLANT
SUT SILK 2 0 (SUTURE) ×1
SUT SILK 2 0 SH CR/8 (SUTURE) ×1 IMPLANT
SUT SILK 2-0 18XBRD TIE 12 (SUTURE) ×1 IMPLANT
SUT SILK 3 0 (SUTURE) ×1
SUT SILK 3 0 SH CR/8 (SUTURE) ×1 IMPLANT
SUT SILK 3-0 18XBRD TIE 12 (SUTURE) ×1 IMPLANT
SUT V-LOC BARB 180 2/0GR6 GS22 (SUTURE) ×2
SUT VIC AB 2-0 SH 18 (SUTURE) IMPLANT
SUT VIC AB 2-0 SH 27 (SUTURE) ×2
SUT VIC AB 2-0 SH 27X BRD (SUTURE) ×2 IMPLANT
SUT VIC AB 4-0 PS2 18 (SUTURE) ×1 IMPLANT
SUTURE V-LC BRB 180 2/0GR6GS22 (SUTURE) IMPLANT
TAPE PAPER 3X10 WHT MICROPORE (GAUZE/BANDAGES/DRESSINGS) IMPLANT
TOWEL OR 17X26 10 PK STRL BLUE (TOWEL DISPOSABLE) ×2 IMPLANT
TOWEL OR NON WOVEN STRL DISP B (DISPOSABLE) ×2 IMPLANT
YANKAUER SUCT BULB TIP NO VENT (SUCTIONS) ×1 IMPLANT

## 2022-12-01 NOTE — Anesthesia Procedure Notes (Signed)
Procedure Name: Intubation Date/Time: 12/01/2022 7:37 AM  Performed by: Florene Route, CRNAPre-anesthesia Checklist: Patient identified, Emergency Drugs available, Suction available and Patient being monitored Patient Re-evaluated:Patient Re-evaluated prior to induction Oxygen Delivery Method: Circle system utilized Preoxygenation: Pre-oxygenation with 100% oxygen Induction Type: IV induction Ventilation: Mask ventilation without difficulty and Oral airway inserted - appropriate to patient size Laryngoscope Size: Miller and 3 Tube type: Oral Tube size: 8.0 mm Number of attempts: 1 Airway Equipment and Method: Stylet Placement Confirmation: ETT inserted through vocal cords under direct vision, positive ETCO2 and breath sounds checked- equal and bilateral Secured at: 22 cm Tube secured with: Tape Dental Injury: Teeth and Oropharynx as per pre-operative assessment

## 2022-12-01 NOTE — Anesthesia Postprocedure Evaluation (Signed)
Anesthesia Post Note  Patient: [name] [name]  Procedure(s) Performed: TAKEDOWN OF LOOP ILEOSTOMY (Abdomen)     Patient location during evaluation: PACU Anesthesia Type: General Level of consciousness: awake and alert Pain management: pain level controlled Vital Signs Assessment: post-procedure vital signs reviewed and stable Respiratory status: spontaneous breathing, nonlabored ventilation, respiratory function stable and patient connected to nasal cannula oxygen Cardiovascular status: stable and blood pressure returned to baseline Anesthetic complications: no   No notable events documented.  Last Vitals:  Vitals:   12/01/22 1000 12/01/22 1013  BP: 99/83 124/75  Pulse: 100 82  Resp: (!) 22 18  Temp: 36.8 C 36.7 C  SpO2: 92% 92%    Last Pain:  Vitals:   12/01/22 1000  TempSrc:   PainSc: 6                  Beryle Lathe

## 2022-12-01 NOTE — H&P (Signed)
CC: Here today for surgery  HPI: [name] [name] is an 62 y.o. male with history of HTN, HLD, DM, BPH, whom is seen in the office today as a referral by Dr. Seymour Bars for evaluation of rectal cancer.  Colonoscopy with Dr. Rhea Belton 10/27/21 -  1. Fungating ulcerated large mass in the rectosigmoid colon, partially circumferential. 4 cm in length. Biopsied. Tattooed distally. 2. 2 mm cecal polyp removed. 3. 6 mm cecal polyp removed. PATH: Rectal biopsy mass moderately differentiated adenocarcinoma. Other polyps were tubular adenomas/benign colon. No dysplasia.  CT CAP 11/03/21 -circumferential wall thickening in the rectum. 2. Lesion of the posterior right lobe of the liver suspicious for liver metastasis. 3. No other evidence of lymphadenopathy or metastatic disease. 4. Hepatomegaly and hepatic steatosis. 5. Prostatomegaly 6. CAD, aortic atherosclerosis.  MRI Abd and pelvis 11/12/21 -  1. cmri [id number] with >6 LNs along sup rectal vein and within mesorectum. +Extramesorectal adenopathy along upper margin of S1 2. 3 x 3.4 x 3 cm subcapsular lesion in posterior right liver seg VII felt to represent liver met.   He had 1 episode of some blood in his stool around Valentine's day this year, none prior or since. He underwent colonoscopy with Dr. Rhea Belton for further evaluation as noted above.  Case was reviewed at multidisciplinary tumor board. He was also seen with our hepatobiliary surgeon, Dr. Freida Busman. He underwent systemic therapy with FOLFOX and in the midst of this underwent surgery with Dr. Vicki Mallet laparoscopy, partial right hepatectomy (metastatic ectomy of segment 7 tumor), intraoperative ultrasound.  Pathology demonstrated metastatic adenocarcinoma.  He is now undergoing Xeloda/radiation which began 05/29/2022, planned completion 07/07/22. He was recently seen in the ER 06/18/2022 with midepigastric/right upper quadrant pain. He does have a history of similar in the past particular in the  immediate postoperative period. This radiated into his chest. He thought it was indigestion and took several OTC meds but that did not help. He subsequently presented to the emergency room for further evaluation. He underwent workup including a CT scan which demonstrated gallstones. CBC and LFTs were normal. Lipase was normal. No evident cholecystitis. His symptoms had improved and he was discharged from the emergency room.  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 Similar circumferential rectal wall thickening reflecting patient's known primary neoplasm. 2. Decreased size of the metastatic hepatic lesion. No new suspicious hepatic lesion identified. 3. No evidence of new or progressive disease in the chest, abdomen or pelvis. 4. Mild hepatomegaly and hepatic steatosis. 5. Colonic diverticulosis without findings of acute diverticulitis. 6. Aortic Atherosclerosis (ICD10-I70.0).  CTA Chest 12/24- no PE CT A/P 12/24 - 1. Negative for acute pulmonary embolism. 2. Cholelithiasis. No evidence of cholecystitis or biliary dilation. If there is concern for acute cholecystitis consider right upper quadrant ultrasound. 3. Slight increase in circumferential rectal wall thickening concerning for progression of known primary neoplasm. 4. Interval resection of the metastasis in hepatic segment VII. No new suspicious hepatic lesion. 5. Colonic diverticulosis without diverticulitis. 6. Aortic Atherosclerosis (ICD10-I70.0).  He is here today for scheduled follow-up. He denies any complaints at present aside from the midepigastric pain he experienced a few nights ago. Currently, no nausea or vomiting. No abdominal pain. He denies any rectal bleeding. He did have some significant change in his stools following initiation of radiation including additional mucus. That has since improved and resolved.  Plan radiation completion date of 07/07/2022.  OR 09/01/22 Robotic assisted low anterior resection  with colorectal anastomosis, diverting  loop ileostomy Diagnostic flexible sigmoidoscopy (necessary to confirm location of lesion, margin) Robotic lysis of adhesions x 60 minutes Intraoperative assessment of perfusion using ICG fluorescence imaging Bilateral transversus abdominus plane (TAP) blocks.  FINDINGS: Adhesions within the lower right lower quadrant between small bowel and small bowel mesentery as well as small bowel and other loops of small bowel. Adhesiolysis necessary for ultimate creation of diverting loop ileostomy. Omental containing adhesions across much of his upper abdomen precluding significant visualization of his liver.   No obvious metastatic disease on visceral parietal peritoneum. Tattoo evident in the mid rectum just distal to the peritoneal reflection. On diagnostic flexible sigmoidoscopy, the tattoo is well distal to the endoluminal mass.   A well perfused, tension free, hemostatic, air tight 31 mm EEA colorectal anastomosis fashioned 10 cm from the anal verge by flexible sigmoidoscopy; anatomically this is at the level of the mid rectum and is clearly well distal to the peritoneal reflection. Diverting loop ileostomy created at conclusion.  PATH A. RECTOSIGMOID COLON, LOW ANTERIOR RESECTION:  Invasive moderately differentiated adenocarcinoma with focal mucinous  features focally invading through muscularis propria (ypT3)  Tumor measures 3.0 x 2.2 x 0.5 cm  Margins free  Two of twelve perirectal lymph nodes with metastatic carcinoma (2/11,  ypT1b)  Two tumor deposits (tumor deposit 0.25 cm from radial margin)  Changes compatible with neoadjuvant therapy   B. FINAL DISTAL MARGIN, EXCISION:  Benign colonic mucosa  Negative for carcinoma   He has been doing well. Improving. Still has some fatigue particularly later in the day. No nausea or vomiting. Tolerating diet. Ostomy working well. Working to stay hydrated nicely. Voiding multiple times per day. Drinking lots of  water throughout the day. He is no longer measuring his ostomy output. He is following with the wound ostomy team and has an appointment with them again tomorrow.  He noted a lump in his right thigh that was at the site of blood thinner injection. Dr. Truett Perna followed it. It is shrinking by his (patient) description.  Flex sig 10/06/22 by myself - Preparation of the colon was fair. - Patent end-to-end colo-colonic anastomosis, characterized by healthy appearing mucosa. - Diverticulosis in the descending colon. - Stool in the descending colon, at the splenic flexure and in the transverse colon. - No specimens collected.  GGE 10/13/22 1. Successful colon study with water-soluble contrast. 2. Contrast flowed freely from the rectum to the terminal ileum. No fixed stenoses identified within the opacified segments. 3. Diverticulosis of the descending and distal transverse colon.  Here today for surgery. He has been doing well. No complaints. Anxious to have his ostomy reversed.  He denies any changes in health or health history since we met in the office. No new medications/allergies. He states he is ready for surgery today.  PMH: HTN, HLD, DM, BPH  PSH: Open appendectomy 1980s  FHx: Multiple relatives with cancer including colon. 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 He returns today with his wife    Past Medical History:  Diagnosis Date   Allergy    SEASONAL   Arthritis    Asthma    only due to hey fever   Blood in stool    Cancer (HCC) 10/2021   rectal cancer   Diabetes mellitus without complication (HCC) 08/25/2014   type 2   Family history of breast cancer  Family history of colon cancer    GERD (gastroesophageal reflux disease)    Hay fever    History of back pain    Hx of colonic polyps    Migraines    PONV (postoperative  nausea and vomiting)    also history of motion sickness    Past Surgical History:  Procedure Laterality Date   ANTERIOR CERVICAL DECOMP/DISCECTOMY FUSION  02/20/2012   Procedure: ANTERIOR CERVICAL DECOMPRESSION/DISCECTOMY FUSION 1 LEVEL;  Surgeon: Maeola Harman, MD;  Location: [facility];  Service: Neurosurgery;  Laterality: N/A;  Cervical six - seven  Anterior cervical decompression/diskectomy/fusion   APPENDECTOMY  06/26/1982   COLONOSCOPY     DIVERTING ILEOSTOMY N/A 09/01/2022   Procedure: DIVERTING LOOP ILEOSTOMY;  Surgeon: Andria Meuse, MD;  Location: [facility];  Service: General;  Laterality: N/A;   FLEXIBLE SIGMOIDOSCOPY N/A 09/01/2022   Procedure: FLEXIBLE SIGMOIDOSCOPY;  Surgeon: Andria Meuse, MD;  Location: [facility];  Service: General;  Laterality: N/A;   FLEXIBLE SIGMOIDOSCOPY N/A 10/06/2022   Procedure: FLEXIBLE SIGMOIDOSCOPY;  Surgeon: Andria Meuse, MD;  Location: [facility];  Service: General;  Laterality: N/A;   IR IMAGING GUIDED PORT INSERTION  11/28/2021   right side   KNEE ARTHROSCOPY Left    LAPAROSCOPIC LYSIS OF ADHESIONS  09/01/2022   Procedure: LAPAROSCOPIC LYSIS OF ADHESIONS;  Surgeon: Andria Meuse, MD;  Location: [facility];  Service: General;;   LAPAROSCOPY N/A 03/10/2022   Procedure: STAGING LAPAROSCOPY;  Surgeon: Fritzi Mandes, MD;  Location: [facility] OR;  Service: General;  Laterality: N/A;   NASAL SEPTUM SURGERY  06/26/2006   OPEN PARTIAL HEPATECTOMY  N/A 03/10/2022   Procedure: OPEN PARTIAL HEPATECTOMY;  Surgeon: Fritzi Mandes, MD;  Location: [facility] OR;  Service: General;  Laterality: N/A;   SPINAL CORD STIMULATOR INSERTION N/A 03/10/2021   Procedure: SPINAL CORD STIMULATOR PLACEMENT;  Surgeon: Venita Lick, MD;  Location: [facility] OR;  Service: Orthopedics;  Laterality: N/A;  2.5 HRS 3C-BED   WISDOM TOOTH EXTRACTION     XI ROBOTIC ASSISTED LOWER ANTERIOR RESECTION N/A 09/01/2022   Procedure: ROBOTIC ASSISTED LOWER ANTERIOR RESECTION AND  INTRAOPERATIVE ASSESSMENT OF PERFUSION USING FIREFLY DYE;  Surgeon: Andria Meuse, MD;  Location: [facility];  Service: General;  Laterality: N/A;    Family History  Problem Relation Age of Onset   Diabetes Mother    Breast cancer Mother 47   Diabetes Father        type II   Colon polyps Sister        11+ polyps   Other Brother        reportedly negative genetic testing   Diabetes Brother    Colon cancer Maternal Aunt    Colon cancer Maternal Uncle        x2   Colon cancer Maternal Uncle        x3   Colon cancer Maternal Uncle    Colon cancer Maternal Uncle    Colon cancer Maternal Uncle    Colon cancer Maternal Uncle    Other Maternal Uncle        farm accident as a teen   Colon cancer Maternal Grandmother    Cancer Maternal Grandmother        breast, colon    Diabetes Paternal Grandfather    Colon cancer Cousin        <50   Colon cancer Cousin        maternal cousin's children   Esophageal cancer Neg  Hx    Rectal cancer Neg Hx    Stomach cancer Neg Hx     Social:  reports that he quit smoking about 37 years ago. His smoking use included cigarettes. He smoked an average of 1 pack per day. He has never been exposed to tobacco smoke. He has never used smokeless tobacco. He reports that he does not currently use alcohol. He reports that he does not use drugs.  Allergies:  Allergies  Allergen Reactions   Other Swelling    Hayfever,GRASS,DOGS,CATS    Wound Dressing Adhesive Rash    Skin tears around Port-a-cath, and ostomy     Medications: I have reviewed the patient's current medications.  Results for orders placed or performed during the hospital encounter of 12/01/22 (from the past 48 hour(s))  Glucose, capillary     Status: Abnormal   Collection Time: 12/01/22  5:47 AM  Result Value Ref Range   Glucose-Capillary 178 (H) 70 - 99 mg/dL    Comment: Glucose reference range applies only to samples taken after fasting for at least 8 hours.   Comment 1 Notify RN     Comment 2 Document in Chart     No results found.   PE Blood pressure 119/85, pulse 91, temperature 97.9 F (36.6 C), temperature source Oral, resp. rate 16, height 6' (1.829 m), weight 110.7 kg, SpO2 94 %. Constitutional: NAD; conversant Eyes: Moist conjunctiva; no lid lag; anicteric Lungs: Normal respiratory effort CV: RRR GI: Abd soft, NT/ND Psychiatric: Appropriate affect  Results for orders placed or performed during the hospital encounter of 12/01/22 (from the past 48 hour(s))  Glucose, capillary     Status: Abnormal   Collection Time: 12/01/22  5:47 AM  Result Value Ref Range   Glucose-Capillary 178 (H) 70 - 99 mg/dL    Comment: Glucose reference range applies only to samples taken after fasting for at least 8 hours.   Comment 1 Notify RN    Comment 2 Document in Chart     No results found.  A/P: [name] [name] is an 62 y.o. male with hx of HTN, HLD, DM, BPH, here for evaluation of newly diagnosed presumed metastatic rectal adenocarcinoma  Appears to be mid-rectum on MRI  [id number] FOLFOX OR with Dr. Freida Busman 03/10/22 -open right partial hepatectomy-segment 7 tumor  chemo/XRT with planned completion 07/07/22  OR 09/01/22 - Robotic LAR/DLI, LoA, Flex sig Path - ypT3N1b  I have messaged Dr. Freida Busman previously regarding his midepigastric pain and potential for having symptomatic cholelithiasis to gauge her thoughts on potential management of this moving forward - monitoring for now  -Nodule in his right thigh resolved  -GGE + flex sig clear  -Continue following with medical oncology for surveillance.  -We reviewed the relevant anatomy. We discussed loop ileostomy reversal. -The planned procedure, material risks (including, but not limited to, pain, bleeding, infection, scarring, need for blood transfusion, damage to surrounding structures- blood vessels/nerves/viscus/organs, leak from anastomosis, need for additional procedures, low anterior resection syndrome  (LARS) = increased fecal urgency and/or frequency, scenarios where a stoma may be necessary and where it may be permanent, worsening of pre-existing medical conditions, chronic diarrhea, constipation secondary to narcotic use, hernia, recurrence, pneumonia, heart attack, stroke, death) benefits and alternatives to surgery were discussed at length. The patient's questions were answered to his satisfaction, he voiced understanding and elected to proceed with surgery. Additionally, we discussed typical postoperative expectations and the recovery process.   Marin Olp, MD [facility], [facility]

## 2022-12-01 NOTE — Discharge Instructions (Signed)
POST OP INSTRUCTIONS AFTER COLON SURGERY  DIET: Be sure to include lots of fluids daily to stay hydrated - 64oz of water per day (8, 8 oz glasses).  Avoid fast food or heavy meals for the first couple of weeks as your are more likely to get nauseated. Avoid raw/uncooked fruits or vegetables for the first 4 weeks (its ok to have these if they are blended into smoothie form). If you have fruits/vegetables, make sure they are cooked until soft enough to mash on the roof of your mouth and chew your food well. Otherwise, diet as tolerated.  Take your usually prescribed home medications unless otherwise directed.  PAIN CONTROL: Pain is best controlled by a usual combination of three different methods TOGETHER: Ice/Heat Over the counter pain medication Prescription pain medication Most patients will experience some swelling and bruising around the surgical site.  Ice packs or heating pads (30-60 minutes up to 6 times a day) will help. Some people prefer to use ice alone, heat alone, alternating between ice & heat.  Experiment to what works for you.  Swelling and bruising can take several weeks to resolve.   It is helpful to take an over-the-counter pain medication regularly for the first few weeks: Ibuprofen (Motrin/Advil) - 200mg  tabs - take 3 tabs (600mg ) every 6 hours as needed for pain (unless you have been directed previously to avoid NSAIDs/ibuprofen) Acetaminophen (Tylenol) - you may take 650mg  every 6 hours as needed. You can take this with motrin as they act differently on the body. If you are taking a narcotic pain medication that has acetaminophen in it, do not take over the counter tylenol at the same time. NOTE: You may take both of these medications together - most patients  find it most helpful when alternating between the two (i.e. Ibuprofen at 6am, tylenol at 9am, ibuprofen at 12pm ..Marland Kitchen) A  prescription for pain medication should be given to you upon discharge.  Take your pain medication as  prescribed if your pain is not adequatly controlled with the over-the-counter pain reliefs mentioned above.  Avoid getting constipated.  Between the surgery and the pain medications, it is common to experience some constipation.  Increasing fluid intake and taking a fiber supplement (such as Metamucil, Citrucel, FiberCon, MiraLax, etc) 1-2 times a day regularly will usually help prevent this problem from occurring.  A mild laxative (prune juice, Milk of Magnesia, MiraLax, etc) should be taken according to package directions if there are no bowel movements after 48 hours.    Dressing: Your former ostomy site does not have to be packed with gauze; ok to remove dressing and bathe getting soap and water over the ostomy takedown site. Pat dry. Cover with 4x4 gauze to protect clothing. The skin will generally fill in and cover this over the next 4-6 weeks.  ACTIVITIES as tolerated:   Avoid heavy lifting (>10lbs or 1 gallon of milk) for the next 6 weeks. You may resume regular daily activities as tolerated--such as daily self-care, walking, climbing stairs--gradually increasing activities as tolerated.  If you can walk 30 minutes without difficulty, it is safe to try more intense activity such as jogging, treadmill, bicycling, low-impact aerobics.  DO NOT PUSH THROUGH PAIN.  Let pain be your guide: If it hurts to do something, don't do it. You may drive when you are no longer taking prescription pain medication, you can comfortably wear a seatbelt, and you can safely maneuver your car and apply brakes.  FOLLOW UP in our  office Please call [facility] at [phone] to set up an appointment to see your surgeon in the office for a follow-up appointment approximately 2 weeks after your surgery. Make sure that you call for this appointment the day you arrive home to insure a convenient appointment time.  9. If you have disability or family leave forms that need to be completed, you may have them completed by your  primary care physician's office; for return to work instructions, please ask our office staff and they will be happy to assist you in obtaining this documentation   When to call us [phone]: Poor pain control Reactions / problems with new medications (rash/itching, etc)  Fever over 101.5 F (38.5 C) Inability to urinate Nausea/vomiting Worsening swelling or bruising Continued bleeding from incision. Increased pain, redness, or drainage from the incision  The clinic staff is available to answer your questions during regular business hours (8:30am-5pm).  Please don't hesitate to call and ask to speak to one of our nurses for clinical concerns.   A surgeon from [facility] is always on call at the hospitals   If you have a medical emergency, go to the nearest emergency room or call 911.  [facility] [address], [address], [place], [place]  [address] MAIN: [phone] FAX: [phone] [email or web address]

## 2022-12-01 NOTE — Progress Notes (Signed)
Mobility Specialist - Progress Note   12/01/22 1328  Oxygen Therapy  SpO2 96 %  Mobility  Activity Ambulated with assistance in hallway  Level of Assistance Standby assist, set-up cues, supervision of patient - no hands on  Assistive Device None  Distance Ambulated (ft) 450 ft  Range of Motion/Exercises Active  Activity Response Tolerated well  Mobility Referral Yes  $Mobility charge 1 Mobility  Mobility Specialist Start Time (ACUTE ONLY) 1317  Mobility Specialist Stop Time (ACUTE ONLY) 1325  Mobility Specialist Time Calculation (min) (ACUTE ONLY) 8 min   Pt received in bed and agreed to mobility. No c/o pain nor discomfort during session. Returned to bed with all needs met and family in room.  Marilynne Halsted Mobility Specialist

## 2022-12-01 NOTE — Op Note (Signed)
12/01/2022  9:07 AM  PATIENT:  [name] [name]  62 y.o. male  Patient Care Team: Junie Spencer, FNP as PCP - General (Family Medicine) Danella Maiers, [facility] as Pharmacist (Family Medicine)  PRE-OPERATIVE DIAGNOSIS:  Ileostomy status, history of rectal cancer  POST-OPERATIVE DIAGNOSIS:  Same  PROCEDURE:  Takedown of loop ileostomy with ileo-ileal anastomosis  SURGEON:  Stephanie Coup. Jaslynn Thome, MD  ASSISTANT: Romie Levee, MD  ANESTHESIA:   local and general  COUNTS:  Sponge, needle and instrument counts were reported correct x2 at the conclusion of the operation.  EBL: 100 mL  DRAINS: None  SPECIMEN: Ileostomy  COMPLICATIONS: None  FINDINGS: Loop ileostomy in RLQ; side-to-side ileo-ileal anastomosis fashioned. Former ostomy site loosely packed with single 4 x 4 gauze  DISPOSITION: PACU in satisfactory condition  DESCRIPTION: The patient was identified in preop holding and taken to the OR where he was placed on the operating room table. SCDs were placed. General endotracheal anesthesia was induced without difficulty.  Hair on the abdomen was clipped.  The ostomy appliance was removed and a single stitch was used to occlude the ostomy site.  Pressure points were evaluated and padded.  He was then prepped and draped in the usual sterile fashion. A surgical timeout was performed indicating the correct patient, procedure, positioning and need for preoperative antibiotics.   The ileostomy site is circumferentially incised and the subcutaneous tissue divided electrocautery.  We are able to readily identify a plane between the subcutaneous fat and the small bowel serosa.  The ostomy is elevated using an Allis clamp and were able to carefully tease the ostomy away from the subcutaneous tissue using Metzenbaum scissors.  Were able to do this all the way down to the level of the fascia.  This layer was then incised and the stoma is fully mobilized.  There are no significant  intra-abdominal adhesions around the ostomy site.  The ostomy and associated limbs of small bowel delivered up onto the field.  The serosa is inspected and noted to be free of any apparent injuries.  Attention is directed at resecting the ileostomy and creating a small bowel anastomosis.  Windows were created in the mesentery on each respective limb.  The proximal and distal sides are then divided using a linear cutting GIA 75 mm blue load stapler.  The intervening mesentery is clamped with hemostats and then divided.  The mesentery is then ligated using 2-0 silk sutures at this level.  The cut edge of the mesentery is inspected and noted to be hemostatic.  The ileostomy was passed off the specimen.  Both respective staple lines were inspected and noted to be hemostatic.  There are well-formed staples present.  Attention is then directed to creating the ileoileal anastomosis.  An enterotomy is created on each respective limb on the antimesenteric edge.  The limbs were then brought together on the antimesenteric side and a ileoileal anastomosis is fashioned in a side-to-side, functional end-to-end manner using a 75 mm GIA blue load stapler.  The anastomosis is inspected and found to be both hemostatic and with well-formed staples.  Attention is then directed at closure of the common enterotomy.  This was done using 2, 2-0 V-Loc sutures in a Connell manner.  The closure was then inspected and found to be complete without any gaps.  Both respective limbs are pink and well-perfused in appearance.  The mesentery is again reinspected and found to be hemostatic.  A 3-0 silk stitches placed at the apex/"crotch"  of the staple line.  The ileostomy hole in the rectus fascia is incised approximately 1 additional centimeter in order to facilitate this going back into the abdomen in an atraumatic manner.  The rectus muscles inspected and noted to be hemostatic.  Attention is then directed at the fascial closure.  2  running #1 PDS sutures were used to close the rectus fascia.  The fascial closure was palpated and noted to be complete.  Local anesthetic consisting of Exparel + 0.25% Marcaine is infiltrated into the surrounding tissues.  All sponge, needle, and instrument counts are reported correct.  The skin of the ileostomy site is cinched down using a 2-0 Vicryl pursestring stitch, leaving a gap enough to fit a finger and to facilitate drainage while this all heals.  The ileostomy site is then waked with a single moist 4 x 4 gauze.  Additional gauze followed by an ABD is placed.  He is then awakened from anesthesia, extubated, and transferred to a stretcher for transport to recovery in satisfactory condition.

## 2022-12-01 NOTE — TOC CM/SW Note (Signed)
Transition of Care [facility]) - Inpatient Brief Assessment  Patient Details  Name: [name] [name] MRN: [id number] Date of Birth: 1961/01/07  Transition of Care [facility]) CM/SW Contact:    Ewing Schlein, LCSW Phone Number: 12/01/2022, 3:42 PM  Clinical Narrative: Screening completed. No TOC needs identified at this time.  Transition of Care Asessment: Insurance and Status: Insurance coverage has been reviewed Patient has primary care physician: Yes Home environment has been reviewed: Resides with spouse Prior level of function:: Independent at baseline Prior/Current Home Services: No current home services Social Determinants of Health Reivew: SDOH reviewed no interventions necessary Readmission risk has been reviewed: Yes Transition of care needs: no transition of care needs at this time

## 2022-12-01 NOTE — Transfer of Care (Signed)
Immediate Anesthesia Transfer of Care Note  Patient: [name] [name]  Procedure(s) Performed: TAKEDOWN OF LOOP ILEOSTOMY (Abdomen)  Patient Location: PACU  Anesthesia Type:General  Level of Consciousness: drowsy  Airway & Oxygen Therapy: Patient Spontanous Breathing and Patient connected to face mask oxygen  Post-op Assessment: Report given to RN and Post -op Vital signs reviewed and stable  Post vital signs: Reviewed and stable  Last Vitals:  Vitals Value Taken Time  BP 125/83 12/01/22 0900  Temp    Pulse 95 12/01/22 0901  Resp 15 12/01/22 0901  SpO2 100 % 12/01/22 0901  Vitals shown include unvalidated device data.  Last Pain:  Vitals:   12/01/22 0608  TempSrc:   PainSc: 0-No pain      Patients Stated Pain Goal: 4 (12/01/22 9147)  Complications: No notable events documented.

## 2022-12-02 ENCOUNTER — Encounter (HOSPITAL_COMMUNITY): Payer: Self-pay | Admitting: Surgery

## 2022-12-02 LAB — CBC
HCT: 40.4 % (ref 39.0–52.0)
Hemoglobin: 13.2 g/dL (ref 13.0–17.0)
MCH: 28.1 pg (ref 26.0–34.0)
MCHC: 32.7 g/dL (ref 30.0–36.0)
MCV: 86 fL (ref 80.0–100.0)
Platelets: 164 10*3/uL (ref 150–400)
RBC: 4.7 MIL/uL (ref 4.22–5.81)
RDW: 14.1 % (ref 11.5–15.5)
WBC: 7.4 10*3/uL (ref 4.0–10.5)
nRBC: 0 % (ref 0.0–0.2)

## 2022-12-02 LAB — GLUCOSE, CAPILLARY
Glucose-Capillary: 172 mg/dL — ABNORMAL HIGH (ref 70–99)
Glucose-Capillary: 159 mg/dL — ABNORMAL HIGH (ref 70–99)
Glucose-Capillary: 197 mg/dL — ABNORMAL HIGH (ref 70–99)

## 2022-12-02 LAB — BASIC METABOLIC PANEL
Anion gap: 10 (ref 5–15)
BUN: 13 mg/dL (ref 8–23)
CO2: 21 mmol/L — ABNORMAL LOW (ref 22–32)
Calcium: 8.5 mg/dL — ABNORMAL LOW (ref 8.9–10.3)
Chloride: 102 mmol/L (ref 98–111)
Creatinine, Ser: 0.9 mg/dL (ref 0.61–1.24)
GFR, Estimated: >60 mL/min (ref >60)
Glucose, Bld: 201 mg/dL — ABNORMAL HIGH (ref 70–99)
Potassium: 4.1 mmol/L (ref 3.5–5.1)
Sodium: 133 mmol/L — ABNORMAL LOW (ref 135–145)

## 2022-12-02 MED ORDER — EMPAGLIFLOZIN 25 MG PO TABS
25.0000 mg | ORAL_TABLET | Freq: Every day | ORAL | Status: DC
  Filled 2022-12-02: qty 1

## 2022-12-02 MED ORDER — CALCIUM POLYCARBOPHIL 625 MG PO TABS
625.0000 mg | ORAL_TABLET | Freq: Two times a day (BID) | ORAL | Status: DC
  Administered 2022-12-02 – 2022-12-03 (×3): 625 mg via ORAL
  Filled 2022-12-02 (×3): qty 1

## 2022-12-02 MED ORDER — OXYCODONE HCL 5 MG PO TABS
5.0000 mg | ORAL_TABLET | ORAL | Status: DC | PRN
  Administered 2022-12-02 – 2022-12-03 (×4): 10 mg via ORAL
  Filled 2022-12-02 (×4): qty 2

## 2022-12-02 MED ORDER — SODIUM CHLORIDE 0.9 % IV SOLN: 250.0000 mL | INTRAVENOUS | Status: DC | PRN

## 2022-12-02 MED ORDER — SODIUM CHLORIDE 0.9% FLUSH
3.0000 mL | Freq: Two times a day (BID) | INTRAVENOUS | Status: DC
  Administered 2022-12-02: 3 mL via INTRAVENOUS

## 2022-12-02 MED ORDER — SODIUM CHLORIDE 0.9% FLUSH: 3.0000 mL | INTRAVENOUS | Status: DC | PRN

## 2022-12-02 NOTE — Progress Notes (Signed)
PHARMACIST - PHYSICIAN COMMUNICATION  CONCERNING: Alvimopan   RECOMMENDATION: This patient is receiving alvimopan post-operatively.  Based on criteria approved by the Pharmacy and Therapeutics Committee, the medication will be discontinued.  DESCRIPTION: These criteria include: Patient will receive NO MORE than 15 doses total during current hospitalization If bowel recovery (documented return of bowel sounds and a bowel movement confirmed by RN or patient) occurs before completion of 7 days of therapy, a pharmacist may discontinue alvimopan  Patient with documented bowel movements. Alvimopan held per [facility] documentation for BM.  If you have questions about this conversion, please contact the Pharmacy Department   Pricilla Riffle, PharmD, BCPS Clinical Pharmacist 12/02/2022 11:56 AM

## 2022-12-02 NOTE — Progress Notes (Signed)
12/02/2022  [name] [name] [id number] 02/18/1961  CARE TEAM: PCP: Junie Spencer, FNP  Outpatient Care Team: Patient Care Team: Junie Spencer, FNP as PCP - General (Family Medicine) Danella Maiers, [facility] as Pharmacist (Family Medicine)  Inpatient Treatment Team: Treatment Team: Attending Provider: Andria Meuse, MD; Licensed Practical Nurse: Wright, [place] E, LPN   Problem List:   Principal Problem:   S/P closure of ileostomy Active Problems:   Rectal cancer (HCC)   GERD   Asthma   Metabolic syndrome   Obese   Diabetes mellitus with stage 3 chronic kidney disease (HCC)   Dyslipidemia   Benign prostatic hyperplasia with incomplete bladder emptying   Chronic low back pain   Degeneration of lumbar intervertebral disc   Chronic pain   12/01/2022  PRE-OPERATIVE DIAGNOSIS:  Ileostomy status, history of rectal cancer   POST-OPERATIVE DIAGNOSIS:  Same   PROCEDURE:  Takedown of loop ileostomy with ileo-ileal anastomosis   SURGEON:  Stephanie Coup. White, MD  FINDINGS: Loop ileostomy in RLQ; side-to-side ileo-ileal anastomosis fashioned. Former ostomy site loosely packed with single 4 x 4 gauze       Assessment [facility] Stay = 1 days) 1 Day Post-Op    Recovering well so far    Plan:  ERAS protocol Advance diet as tolerated -try solid diet this morning Diabetic control with sliding scale insulin.  Restart Jardiance.  Hold metformin for now.  Follow Hypertension control. Stop IV fluids Most likely can remove gauze from old ileostomy wound.  Day #2 = tomorrow. -VTE prophylaxis- SCDs, etc -mobilize as tolerated to help recovery -Disposition:  Disposition:  The patient is from: Home  Anticipate discharge to:  Home  Anticipated Date of Discharge is:  June 9,2024    Barriers to discharge:  Pending Clinical improvement (more likely than not)  Patient currently is NOT MEDICALLY STABLE for discharge from the hospital from a surgery  standpoint.      I reviewed nursing notes, last 24 h vitals and pain scores, last 48 h intake and output, last 24 h labs and trends, and last 24 h imaging results.  I have reviewed this patient's available data, including medical history, events of note, test results, etc as part of my evaluation.   A significant portion of that time was spent in counseling. Care during the described time interval was provided by me.  This care required moderate level of medical decision making.  12/02/2022    Subjective: (Chief complaint)  Patient feels sore but otherwise well.  Wife in room.  Walking hallways.  Had bowel movements.  Tolerating liquids.  Wanting to try more solid food.  Objective:  Vital signs:  Vitals:   12/01/22 2153 12/02/22 0152 12/02/22 0500 12/02/22 0502  BP: 118/71 109/72  115/77  Pulse: (!) 107 93  83  Resp: 18 18  16   Temp: 98.7 F (37.1 C) 98.7 F (37.1 C)  98 F (36.7 C)  TempSrc: Oral Oral  Oral  SpO2: 92% 95%  95%  Weight:   114.3 kg   Height:        Last BM Date : 11/30/22  Intake/Output   Yesterday:  06/07 0701 - 06/08 0700 In: 2903.8 [P.O.:480; I.V.:2323.8; IV Piggyback:100] Out: 1600 [Urine:1500; Blood:100] This shift:  No intake/output data recorded.  Bowel function:  Flatus: YES  BM:  YES  Drain: (No drain)   Physical Exam:  General: Pt awake/alert in no acute distress Eyes: PERRL, normal EOM.  Sclera clear.  No icterus Neuro: CN II-XII intact w/o focal sensory/motor deficits. Lymph: No head/neck/groin lymphadenopathy Psych:  No delerium/psychosis/paranoia.  Oriented x 4 HENT: Normocephalic, Mucus membranes moist.  No thrush Neck: Supple, No tracheal deviation.  No obvious thyromegaly Chest: No pain to chest wall compression.  Good respiratory excursion.  No audible wheezing CV:  Pulses intact.  Regular rhythm.  No major extremity edema MS: Normal AROM mjr joints.  No obvious deformity  Abdomen: Soft.  Mildy distended.   Mildly tender at incisions only.  No old right-sided ileostomy wound with scant old blood.  Wick in place.  No evidence of peritonitis.  No incarcerated hernias.  Ext:   No deformity.  No mjr edema.  No cyanosis Skin: No petechiae / purpurea.  No major sores.  Warm and dry    Results:   Cultures: No results found for this or any previous visit (from the past 720 hour(s)).  Labs: Results for orders placed or performed during the hospital encounter of 12/01/22 (from the past 48 hour(s))  Glucose, capillary     Status: Abnormal   Collection Time: 12/01/22  5:47 AM  Result Value Ref Range   Glucose-Capillary 178 (H) 70 - 99 mg/dL    Comment: Glucose reference range applies only to samples taken after fasting for at least 8 hours.   Comment 1 Notify RN    Comment 2 Document in Chart   Glucose, capillary     Status: Abnormal   Collection Time: 12/01/22  9:01 AM  Result Value Ref Range   Glucose-Capillary 207 (H) 70 - 99 mg/dL    Comment: Glucose reference range applies only to samples taken after fasting for at least 8 hours.  Glucose, capillary     Status: Abnormal   Collection Time: 12/01/22  9:46 AM  Result Value Ref Range   Glucose-Capillary 238 (H) 70 - 99 mg/dL    Comment: Glucose reference range applies only to samples taken after fasting for at least 8 hours.  Glucose, capillary     Status: Abnormal   Collection Time: 12/01/22 11:24 AM  Result Value Ref Range   Glucose-Capillary 236 (H) 70 - 99 mg/dL    Comment: Glucose reference range applies only to samples taken after fasting for at least 8 hours.  Glucose, capillary     Status: Abnormal   Collection Time: 12/01/22  5:07 PM  Result Value Ref Range   Glucose-Capillary 233 (H) 70 - 99 mg/dL    Comment: Glucose reference range applies only to samples taken after fasting for at least 8 hours.  Glucose, capillary     Status: Abnormal   Collection Time: 12/01/22  9:55 PM  Result Value Ref Range   Glucose-Capillary 210 (H)  70 - 99 mg/dL    Comment: Glucose reference range applies only to samples taken after fasting for at least 8 hours.  CBC     Status: None   Collection Time: 12/02/22  5:31 AM  Result Value Ref Range   WBC 7.4 4.0 - 10.5 K/uL   RBC 4.70 4.22 - 5.81 MIL/uL   Hemoglobin 13.2 13.0 - 17.0 g/dL   HCT 16.1 09.6 - 04.5 %   MCV 86.0 80.0 - 100.0 fL   MCH 28.1 26.0 - 34.0 pg   MCHC 32.7 30.0 - 36.0 g/dL   RDW 40.9 81.1 - 91.4 %   Platelets 164 150 - 400 K/uL   nRBC 0.0 0.0 - 0.2 %    Comment: Performed at [facility], [address] [place], [place] [address]  Basic metabolic panel     Status: Abnormal   Collection Time: 12/02/22  5:31 AM  Result Value Ref Range   Sodium 133 (L) 135 - 145 mmol/L   Potassium 4.1 3.5 - 5.1 mmol/L   Chloride 102 98 - 111 mmol/L   CO2 21 (L) 22 - 32 mmol/L   Glucose, Bld 201 (H) 70 - 99 mg/dL    Comment: Glucose reference range applies only to samples taken after fasting for at least 8 hours.   BUN 13 8 - 23 mg/dL   Creatinine, Ser 6.26 0.61 - 1.24 mg/dL   Calcium 8.5 (L) 8.9 - 10.3 mg/dL   GFR, Estimated >94 >85 mL/min    Comment: (NOTE) Calculated using the CKD-EPI Creatinine Equation (2021)    Anion gap 10 5 - 15    Comment: Performed at [facility], [address] [place], [place] [address]    Imaging / Studies: No results found.  Medications / Allergies: per chart  Antibiotics: Anti-infectives (From admission, onward)    Start     Dose/Rate Route Frequency Ordered Stop   12/01/22 0600  cefoTEtan (CEFOTAN) 2 g in sodium chloride 0.9 % 100 mL IVPB        2 g 200 mL/hr over 30 Minutes Intravenous On call to O.R. 12/01/22 0544 12/02/22 0726         Note: Portions of this report may have been transcribed using voice recognition software. Every effort was made to ensure accuracy; however, inadvertent computerized transcription errors may be present.   Any transcriptional errors that result from this  process are unintentional.    Ardeth Sportsman, MD, FACS, MASCRS Esophageal, Gastrointestinal & Colorectal Surgery Robotic and Minimally Invasive Surgery  [facility] [address] [place], [place] [address] [phone] Fax [phone] Main  CONTACT INFORMATION:  Weekday (9AM-5PM): Call [facility] main office at [phone]  Weeknight (5PM-9AM) or Weekend/Holiday: Check [email or web address] (password " [email or web address]") for General Surgery [facility] coverage  (Please, do not use SecureChat as it is not reliable communication to reach operating surgeons for immediate patient care given surgeries/outpatient duties/clinic/cross-coverage/off post-call which would lead to a delay in care.  Epic staff messaging available for outptient concerns, but may not be answered for 48 hours or more).     12/02/2022  8:48 AM

## 2022-12-03 LAB — CBC
HCT: 39.9 % (ref 39.0–52.0)
Hemoglobin: 12.7 g/dL — ABNORMAL LOW (ref 13.0–17.0)
MCH: 27.9 pg (ref 26.0–34.0)
MCHC: 31.8 g/dL (ref 30.0–36.0)
MCV: 87.7 fL (ref 80.0–100.0)
Platelets: 137 10*3/uL — ABNORMAL LOW (ref 150–400)
RBC: 4.55 MIL/uL (ref 4.22–5.81)
RDW: 14.5 % (ref 11.5–15.5)
WBC: 5.5 10*3/uL (ref 4.0–10.5)
nRBC: 0 % (ref 0.0–0.2)

## 2022-12-03 LAB — GLUCOSE, CAPILLARY: Glucose-Capillary: 181 mg/dL — ABNORMAL HIGH (ref 70–99)

## 2022-12-03 LAB — BASIC METABOLIC PANEL
Anion gap: 9 (ref 5–15)
BUN: 13 mg/dL (ref 8–23)
CO2: 23 mmol/L (ref 22–32)
Calcium: 8.3 mg/dL — ABNORMAL LOW (ref 8.9–10.3)
Chloride: 103 mmol/L (ref 98–111)
Creatinine, Ser: 0.96 mg/dL (ref 0.61–1.24)
GFR, Estimated: >60 mL/min (ref >60)
Glucose, Bld: 222 mg/dL — ABNORMAL HIGH (ref 70–99)
Potassium: 3.4 mmol/L — ABNORMAL LOW (ref 3.5–5.1)
Sodium: 135 mmol/L (ref 135–145)

## 2022-12-03 MED ORDER — OXYCODONE-ACETAMINOPHEN 5-325 MG PO TABS
1.0000 | ORAL_TABLET | Freq: Four times a day (QID) | ORAL | 0 refills | Status: DC | PRN
Start: 2022-12-03 — End: 2023-01-03

## 2022-12-03 NOTE — Progress Notes (Signed)
Assessment unchanged. Pt verbalized understanding of dc instructions including medications, wound care, follow up care and when to call the doctor. Discharged via wc to front entrance accompanied by NT and Wife.

## 2022-12-03 NOTE — Plan of Care (Signed)

## 2022-12-03 NOTE — Discharge Summary (Signed)
Physician Discharge Summary    Patient ID: [name] [name] MRN: [id number] DOB/AGE: [birth year]  62 y.o.  Patient Care Team: Junie Spencer, FNP as PCP - General (Family Medicine) Danella Maiers, [facility] as Pharmacist (Family Medicine)  Admit date: 12/01/2022  Discharge date: 12/03/2022  Hospital Stay = 2 days    Discharge Diagnoses:  Principal Problem:   S/P closure of ileostomy Active Problems:   Rectal cancer (HCC)   GERD   Asthma   Metabolic syndrome   Obese   Diabetes mellitus with stage 3 chronic kidney disease (HCC)   Dyslipidemia   Benign prostatic hyperplasia with incomplete bladder emptying   Chronic low back pain   Degeneration of lumbar intervertebral disc   Chronic pain   2 Days Post-Op  12/01/2022  PRE-OPERATIVE DIAGNOSIS:  Ileostomy status, history of rectal cancer   POST-OPERATIVE DIAGNOSIS:  Same   PROCEDURE:  Takedown of loop ileostomy with ileo-ileal anastomosis   SURGEON:  Stephanie Coup. White, MD   FINDINGS: Loop ileostomy in RLQ; side-to-side ileo-ileal anastomosis fashioned. Former ostomy site loosely packed with single 4 x 4 gauze     Consults: Case Management / Social Work and Anesthesia  Hospital Course:   The patient underwent the surgery above.  Postoperatively, the patient gradually mobilized and advanced to a solid diet.  Pain and other symptoms were treated aggressively.    By the time of discharge, the patient was walking well the hallways, eating food, having flatus.  Pain was well-controlled on an oral medications.  Based on meeting discharge criteria and continuing to recover, I felt it was safe for the patient to be discharged from the hospital to further recover with close followup. Postoperative recommendations were discussed in detail.  They are written as well.  Discharged Condition: good  Discharge Exam: Blood pressure 108/74, pulse 93, temperature 98.6 F (37 C), temperature source Oral, resp. rate 16, height 6'  (1.829 m), weight 113.8 kg, SpO2 93 %.  General: Pt awake/alert/oriented x4 in No acute distress Eyes: PERRL, normal EOM.  Sclera clear.  No icterus Neuro: CN II-XII intact w/o focal sensory/motor deficits. Lymph: No head/neck/groin lymphadenopathy Psych:  No delerium/psychosis/paranoia HENT: Normocephalic, Mucus membranes moist.  No thrush Neck: Supple, No tracheal deviation Chest:  No chest wall pain w good excursion CV:  Pulses intact.  Regular rhythm MS: Normal AROM mjr joints.  No obvious deformity Abdomen: Soft.  Nondistended.  Mildly tender at incisions only.  Packing removed from old colostomy with partial pursestring closure.  2x2x3cm deep wound clean with no active bleeding.  No dehiscence.  No evidence of peritonitis.  No incarcerated hernias. Ext:  SCDs BLE.  No mjr edema.  No cyanosis Skin: No petechiae / purpura   Disposition:    Follow-up Information     Andria Meuse, MD Follow up on 12/25/2022.   Specialties: General Surgery, Colon and Rectal Surgery Why: Please arrive by 8:45 am Contact information: [address] [place] [address] [phone]                 Discharge disposition: 01-Home or Self Care       Discharge Instructions     Call MD for:   Complete by: As directed    FEVER > 101.5 F (Temperatures <101.29F can occasionally happen and are not significant)   Call MD for:  extreme fatigue   Complete by: As directed    Call MD for:  persistant dizziness or light-headedness  Complete by: As directed    Call MD for:  persistant nausea and vomiting   Complete by: As directed    Call MD for:  redness, tenderness, or signs of infection (pain, swelling, redness, odor or green/yellow discharge around incision site)   Complete by: As directed    Call MD for:  severe uncontrolled pain   Complete by: As directed    Diet - low sodium heart healthy   Complete by: As directed    Follow a light diet the first few  days at home.    If you feel full, bloated, or constipated, stay on a liquid diet until you feel better and not constipated. Gradually get back to a solid diet.  Avoid fast food or heavy meals the first week as you are more likely to get nauseated. It is expected for your digestive tract to need a few months to get back to normal.   Discharge wound care:   Complete by: As directed    You have an open wound.   In general, it is encouraged that you remove your dressing and packing, shower with soap & water, and replace your dressing with clean 4x4in gauze or a large BandAid once a day.    Eventually your body will heal & pull the open wound closed over the next few weeks.  Raw open wounds will occasionally bleed or secrete yellow drainage until it heals closed.   Pressure on the dressing for 30 minutes will stop most wound bleeding Drain sites will drain a little until the drain is removed.   Driving Restrictions   Complete by: As directed    You may drive when you are no longer taking narcotic prescription pain medication, you can comfortably wear a seatbelt, and you can safely make sudden turns/stops to protect yourself without hesitating due to pain.   Increase activity slowly   Complete by: As directed    Lifting restrictions   Complete by: As directed    Start light daily activities --- self-care, walking, climbing stairs- beginning the day after surgery.   Gradually increase activities as tolerated.   Control your pain to be active.   Stop when you are tired.   Ideally, walk several times a day, eventually an hour a day.   Most people are back to most day-to-day activities in a few weeks.  It takes 4-8 weeks to get back to unrestricted, intense activity. If you can walk 30 minutes without difficulty, it is safe to try more intense activity such as jogging, treadmill, bicycling, low-impact aerobics, swimming, etc. Save the most intensive and strenuous activity for last (Usually 4-8  weeks after surgery) such as sit-ups, heavy lifting, contact sports, etc.   Refrain from any intense heavy lifting or straining until you are off narcotics for pain control.  You will have off days, but things should improve week-by-week. DO NOT PUSH THROUGH PAIN.   Let pain be your guide: If it hurts to do something, don't do it.  Pain is your body warning you to avoid that activity for another week until the pain goes down.   May shower / Bathe   Complete by: As directed    May walk up steps   Complete by: As directed    Sexual Activity Restrictions   Complete by: As directed    You may have sexual intercourse when it is comfortable. If it hurts to do something, stop.       Allergies as of  12/03/2022       Reactions   Other Swelling   Hayfever,GRASS,DOGS,CATS   Wound Dressing Adhesive Rash   Skin tears around Port-a-cath, and ostomy         Medication List     TAKE these medications    empagliflozin 25 MG Tabs tablet Commonly known as: Jardiance Take 1 tablet (25 mg total) by mouth daily before breakfast.   fluticasone 50 MCG/ACT nasal spray Commonly known as: FLONASE Place 2 sprays into both nostrils daily.   FreeStyle Libre 3 Sensor Misc Place 1 sensor on the skin every 14 days. Use to check glucose continuously.  DX: E11.65   Insulin Pen Needle 29G X Misc 1 Application by Does not apply route at bedtime.   levocetirizine 5 MG tablet Commonly known as: XYZAL Take 1 tablet (5 mg total) by mouth every evening.   LORazepam 0.5 MG tablet Commonly known as: ATIVAN Take 1 tablet (0.5 mg total) by mouth every 8 (eight) hours as needed for anxiety.   metFORMIN 500 MG tablet Commonly known as: GLUCOPHAGE Take 1 tablet (500 mg total) by mouth 2 (two) times daily with a meal.   montelukast 10 MG tablet Commonly known as: SINGULAIR Take 1 tablet (10 mg total) by mouth at bedtime.   naproxen sodium 220 MG tablet Commonly known as: ALEVE Take 440 mg by mouth 2  (two) times daily.   omeprazole 40 MG capsule Commonly known as: PRILOSEC Take 1 capsule (40 mg total) by mouth daily as needed. For acid reflux What changed:  when to take this additional instructions   oxyCODONE-acetaminophen 5-325 MG tablet Commonly known as: PERCOCET/ROXICET Take 1 tablet by mouth every 6 (six) hours as needed for severe pain.   rosuvastatin 10 MG tablet Commonly known as: Crestor Take 1 tablet (10 mg total) by mouth daily.   tamsulosin 0.4 MG Caps capsule Commonly known as: FLOMAX Take 2 capsules (0.8 mg total) by mouth daily.   Trulicity 1.5 MG/0.5ML Sopn Generic drug: Dulaglutide Inject 1.5 mg into the skin once a week. What changed: when to take this               Discharge Care Instructions  (From admission, onward)           Start     Ordered   12/03/22 0000  Discharge wound care:       Comments: You have an open wound.   In general, it is encouraged that you remove your dressing and packing, shower with soap & water, and replace your dressing with clean 4x4in gauze or a large BandAid once a day.    Eventually your body will heal & pull the open wound closed over the next few weeks.  Raw open wounds will occasionally bleed or secrete yellow drainage until it heals closed.   Pressure on the dressing for 30 minutes will stop most wound bleeding Drain sites will drain a little until the drain is removed.   12/03/22 0926            Significant Diagnostic Studies:  Results for orders placed or performed during the hospital encounter of 12/01/22 (from the past 72 hour(s))  Glucose, capillary     Status: Abnormal   Collection Time: 12/01/22  5:47 AM  Result Value Ref Range   Glucose-Capillary 178 (H) 70 - 99 mg/dL    Comment: Glucose reference range applies only to samples taken after fasting for at least 8 hours.   Comment  1 Notify RN    Comment 2 Document in Chart   Glucose, capillary     Status: Abnormal   Collection Time:  12/01/22  9:01 AM  Result Value Ref Range   Glucose-Capillary 207 (H) 70 - 99 mg/dL    Comment: Glucose reference range applies only to samples taken after fasting for at least 8 hours.  Glucose, capillary     Status: Abnormal   Collection Time: 12/01/22  9:46 AM  Result Value Ref Range   Glucose-Capillary 238 (H) 70 - 99 mg/dL    Comment: Glucose reference range applies only to samples taken after fasting for at least 8 hours.  Glucose, capillary     Status: Abnormal   Collection Time: 12/01/22 11:24 AM  Result Value Ref Range   Glucose-Capillary 236 (H) 70 - 99 mg/dL    Comment: Glucose reference range applies only to samples taken after fasting for at least 8 hours.  Glucose, capillary     Status: Abnormal   Collection Time: 12/01/22  5:07 PM  Result Value Ref Range   Glucose-Capillary 233 (H) 70 - 99 mg/dL    Comment: Glucose reference range applies only to samples taken after fasting for at least 8 hours.  Glucose, capillary     Status: Abnormal   Collection Time: 12/01/22  9:55 PM  Result Value Ref Range   Glucose-Capillary 210 (H) 70 - 99 mg/dL    Comment: Glucose reference range applies only to samples taken after fasting for at least 8 hours.  CBC     Status: None   Collection Time: 12/02/22  5:31 AM  Result Value Ref Range   WBC 7.4 4.0 - 10.5 K/uL   RBC 4.70 4.22 - 5.81 MIL/uL   Hemoglobin 13.2 13.0 - 17.0 g/dL   HCT 16.1 09.6 - 04.5 %   MCV 86.0 80.0 - 100.0 fL   MCH 28.1 26.0 - 34.0 pg   MCHC 32.7 30.0 - 36.0 g/dL   RDW 40.9 81.1 - 91.4 %   Platelets 164 150 - 400 K/uL   nRBC 0.0 0.0 - 0.2 %    Comment: Performed at [facility], [address], [place], [place] [address]  Basic metabolic panel     Status: Abnormal   Collection Time: 12/02/22  5:31 AM  Result Value Ref Range   Sodium 133 (L) 135 - 145 mmol/L   Potassium 4.1 3.5 - 5.1 mmol/L   Chloride 102 98 - 111 mmol/L   CO2 21 (L) 22 - 32 mmol/L   Glucose, Bld 201 (H) 70 - 99 mg/dL     Comment: Glucose reference range applies only to samples taken after fasting for at least 8 hours.   BUN 13 8 - 23 mg/dL   Creatinine, Ser 6.21 0.61 - 1.24 mg/dL   Calcium 8.5 (L) 8.9 - 10.3 mg/dL   GFR, Estimated >30 >86 mL/min    Comment: (NOTE) Calculated using the CKD-EPI Creatinine Equation (2021)    Anion gap 10 5 - 15    Comment: Performed at [facility], [address], [place], [place] [address]  Glucose, capillary     Status: Abnormal   Collection Time: 12/02/22 12:21 PM  Result Value Ref Range   Glucose-Capillary 172 (H) 70 - 99 mg/dL    Comment: Glucose reference range applies only to samples taken after fasting for at least 8 hours.  Glucose, capillary     Status: Abnormal   Collection Time:  12/02/22  4:49 PM  Result Value Ref Range   Glucose-Capillary 159 (H) 70 - 99 mg/dL    Comment: Glucose reference range applies only to samples taken after fasting for at least 8 hours.  Glucose, capillary     Status: Abnormal   Collection Time: 12/02/22  9:38 PM  Result Value Ref Range   Glucose-Capillary 197 (H) 70 - 99 mg/dL    Comment: Glucose reference range applies only to samples taken after fasting for at least 8 hours.  CBC     Status: Abnormal   Collection Time: 12/03/22  5:12 AM  Result Value Ref Range   WBC 5.5 4.0 - 10.5 K/uL   RBC 4.55 4.22 - 5.81 MIL/uL   Hemoglobin 12.7 (L) 13.0 - 17.0 g/dL   HCT 16.1 09.6 - 04.5 %   MCV 87.7 80.0 - 100.0 fL   MCH 27.9 26.0 - 34.0 pg   MCHC 31.8 30.0 - 36.0 g/dL   RDW 40.9 81.1 - 91.4 %   Platelets 137 (L) 150 - 400 K/uL   nRBC 0.0 0.0 - 0.2 %    Comment: Performed at [facility], [address], [place], [place] [address]  Basic metabolic panel     Status: Abnormal   Collection Time: 12/03/22  5:12 AM  Result Value Ref Range   Sodium 135 135 - 145 mmol/L   Potassium 3.4 (L) 3.5 - 5.1 mmol/L   Chloride 103 98 - 111 mmol/L   CO2 23 22 - 32 mmol/L   Glucose, Bld 222 (H) 70 - 99 mg/dL     Comment: Glucose reference range applies only to samples taken after fasting for at least 8 hours.   BUN 13 8 - 23 mg/dL   Creatinine, Ser 6.21 0.61 - 1.24 mg/dL   Calcium 8.3 (L) 8.9 - 10.3 mg/dL   GFR, Estimated >30 >86 mL/min    Comment: (NOTE) Calculated using the CKD-EPI Creatinine Equation (2021)    Anion gap 9 5 - 15    Comment: Performed at [facility], [address] [place], [place] [address]  Glucose, capillary     Status: Abnormal   Collection Time: 12/03/22  7:37 AM  Result Value Ref Range   Glucose-Capillary 181 (H) 70 - 99 mg/dL    Comment: Glucose reference range applies only to samples taken after fasting for at least 8 hours.    No results found.  Past Medical History:  Diagnosis Date   Allergy    SEASONAL   Arthritis    Asthma    only due to hey fever   Blood in stool    Cancer (HCC) 10/2021   rectal cancer   Diabetes mellitus without complication (HCC) 08/25/2014   type 2   Family history of breast cancer    Family history of colon cancer    GERD (gastroesophageal reflux disease)    Hay fever    History of back pain    Hx of colonic polyps    Migraines    PONV (postoperative nausea and vomiting)    also history of motion sickness    Past Surgical History:  Procedure Laterality Date   ANTERIOR CERVICAL DECOMP/DISCECTOMY FUSION  02/20/2012   Procedure: ANTERIOR CERVICAL DECOMPRESSION/DISCECTOMY FUSION 1 LEVEL;  Surgeon: Maeola Harman, MD;  Location: [facility];  Service: Neurosurgery;  Laterality: N/A;  Cervical six - seven  Anterior cervical decompression/diskectomy/fusion   APPENDECTOMY  06/26/1982   COLONOSCOPY  DIVERTING ILEOSTOMY N/A 09/01/2022   Procedure: DIVERTING LOOP ILEOSTOMY;  Surgeon: Andria Meuse, MD;  Location: [facility];  Service: General;  Laterality: N/A;   FLEXIBLE SIGMOIDOSCOPY N/A 09/01/2022   Procedure: FLEXIBLE SIGMOIDOSCOPY;  Surgeon: Andria Meuse, MD;  Location: [facility];  Service:  General;  Laterality: N/A;   FLEXIBLE SIGMOIDOSCOPY N/A 10/06/2022   Procedure: FLEXIBLE SIGMOIDOSCOPY;  Surgeon: Andria Meuse, MD;  Location: [facility];  Service: General;  Laterality: N/A;   ILEOSTOMY CLOSURE N/A 12/01/2022   Procedure: TAKEDOWN OF LOOP ILEOSTOMY;  Surgeon: Andria Meuse, MD;  Location: [facility];  Service: General;  Laterality: N/A;   IR IMAGING GUIDED PORT INSERTION  11/28/2021   right side   KNEE ARTHROSCOPY Left    LAPAROSCOPIC LYSIS OF ADHESIONS  09/01/2022   Procedure: LAPAROSCOPIC LYSIS OF ADHESIONS;  Surgeon: Andria Meuse, MD;  Location: [facility];  Service: General;;   LAPAROSCOPY N/A 03/10/2022   Procedure: STAGING LAPAROSCOPY;  Surgeon: Fritzi Mandes, MD;  Location: [facility] OR;  Service: General;  Laterality: N/A;   NASAL SEPTUM SURGERY  06/26/2006   OPEN PARTIAL HEPATECTOMY  N/A 03/10/2022   Procedure: OPEN PARTIAL HEPATECTOMY;  Surgeon: Fritzi Mandes, MD;  Location: [facility] OR;  Service: General;  Laterality: N/A;   SPINAL CORD STIMULATOR INSERTION N/A 03/10/2021   Procedure: SPINAL CORD STIMULATOR PLACEMENT;  Surgeon: Venita Lick, MD;  Location: [facility] OR;  Service: Orthopedics;  Laterality: N/A;  2.5 HRS 3C-BED   WISDOM TOOTH EXTRACTION     XI ROBOTIC ASSISTED LOWER ANTERIOR RESECTION N/A 09/01/2022   Procedure: ROBOTIC ASSISTED LOWER ANTERIOR RESECTION AND INTRAOPERATIVE ASSESSMENT OF PERFUSION USING FIREFLY DYE;  Surgeon: Andria Meuse, MD;  Location: [facility];  Service: General;  Laterality: N/A;    Social History   Socioeconomic History   Marital status: Married    Spouse name: Not on file   Number of children: Not on file   Years of education: Not on file   Highest education level: Not on file  Occupational History   Not on file  Tobacco Use   Smoking status: Former    Packs/day: 1    Types: Cigarettes    Quit date: 4    Years since quitting: 37.4    Passive exposure: Never   Smokeless tobacco: Never  Vaping Use    Vaping Use: Never used  Substance and Sexual Activity   Alcohol use: Not Currently    Comment: occasional beer   Drug use: No   Sexual activity: Yes  Other Topics Concern   Not on file  Social History Narrative   Not on file   Social Determinants of Health   Financial Resource Strain: Patient Declined (11/12/2022)   Overall Financial Resource Strain (CARDIA)    Difficulty of Paying Living Expenses: Patient declined  Food Insecurity: Patient Declined (11/12/2022)   Hunger Vital Sign    Worried About Running Out of Food in the Last Year: Patient declined    Ran Out of Food in the Last Year: Patient declined  Transportation Needs: Patient Declined (11/12/2022)   PRAPARE - Administrator, Civil Service (Medical): Patient declined    Lack of Transportation (Non-Medical): Patient declined  Physical Activity: Not on file  Stress: Stress Concern Present (11/22/2021)   [name] of Occupational Health - Occupational Stress Questionnaire    Feeling of Stress : To some extent  Social Connections: Unknown (11/12/2022)   Social Connection and Isolation Panel [NHANES]  Frequency of Communication with Friends and Family: Patient declined    Frequency of Social Gatherings with Friends and Family: Patient declined    Attends Religious Services: Patient declined    Database administrator or Organizations: Patient declined    Attends Banker Meetings: Not on file    Marital Status: Patient declined  Intimate Partner Violence: Not At Risk (09/01/2022)   Humiliation, Afraid, Rape, and Kick questionnaire    Fear of Current or Ex-Partner: No    Emotionally Abused: No    Physically Abused: No    Sexually Abused: No    Family History  Problem Relation Age of Onset   Diabetes Mother    Breast cancer Mother 25   Diabetes Father        type II   Colon polyps Sister        11+ polyps   Other Brother        reportedly negative genetic testing   Diabetes Brother     Colon cancer Maternal Aunt    Colon cancer Maternal Uncle        x2   Colon cancer Maternal Uncle        x3   Colon cancer Maternal Uncle    Colon cancer Maternal Uncle    Colon cancer Maternal Uncle    Colon cancer Maternal Uncle    Other Maternal Uncle        farm accident as a teen   Colon cancer Maternal Grandmother    Cancer Maternal Grandmother        breast, colon    Diabetes Paternal Grandfather    Colon cancer Cousin        <50   Colon cancer Cousin        maternal cousin's children   Esophageal cancer Neg Hx    Rectal cancer Neg Hx    Stomach cancer Neg Hx     Current Facility-Administered Medications  Medication Dose Route Frequency Provider Last Rate Last Admin   0.9 %  sodium chloride infusion  250 mL Intravenous PRN Karie Soda, MD       acetaminophen (TYLENOL) tablet 1,000 mg  1,000 mg Oral Q6H Andria Meuse, MD   1,000 mg at 12/03/22 0552   alum & mag hydroxide-simeth (MAALOX/MYLANTA) 200-200-20 MG/5ML suspension 30 mL  30 mL Oral Q6H PRN Andria Meuse, MD       diphenhydrAMINE (BENADRYL) 12.5 MG/5ML elixir 12.5 mg  12.5 mg Oral Q6H PRN Andria Meuse, MD       Or   diphenhydrAMINE (BENADRYL) injection 12.5 mg  12.5 mg Intravenous Q6H PRN Andria Meuse, MD       empagliflozin (JARDIANCE) tablet 25 mg  25 mg Oral QAC breakfast Karie Soda, MD       fluticasone (FLONASE) 50 MCG/ACT nasal spray 2 spray  2 spray Each Nare Daily Andria Meuse, MD   2 spray at 12/03/22 0805   heparin injection 5,000 Units  5,000 Units Subcutaneous Q8H Andria Meuse, MD   5,000 Units at 12/03/22 0552   hydrALAZINE (APRESOLINE) injection 10 mg  10 mg Intravenous Q2H PRN Andria Meuse, MD       HYDROmorphone (DILAUDID) injection 0.5 mg  0.5 mg Intravenous Q3H PRN Andria Meuse, MD   0.5 mg at 12/02/22 1610   ibuprofen (ADVIL) tablet 600 mg  600 mg Oral Q6H PRN Andria Meuse, MD       insulin aspart (  novoLOG) injection 0-15  Units  0-15 Units Subcutaneous TID WC Andria Meuse, MD   3 Units at 12/03/22 0804   insulin aspart (novoLOG) injection 0-5 Units  0-5 Units Subcutaneous QHS Andria Meuse, MD   2 Units at 12/01/22 2254   loratadine (CLARITIN) tablet 10 mg  10 mg Oral QPM Andria Meuse, MD   10 mg at 12/02/22 1732   LORazepam (ATIVAN) tablet 0.5 mg  0.5 mg Oral Q8H PRN Andria Meuse, MD   0.5 mg at 12/03/22 0039   montelukast (SINGULAIR) tablet 10 mg  10 mg Oral QHS Andria Meuse, MD   10 mg at 12/02/22 2236   ondansetron (ZOFRAN) tablet 4 mg  4 mg Oral Q6H PRN Andria Meuse, MD       Or   ondansetron [facility]) injection 4 mg  4 mg Intravenous Q6H PRN Andria Meuse, MD   4 mg at 12/01/22 2042   oxyCODONE (Oxy IR/ROXICODONE) immediate release tablet 5-10 mg  5-10 mg Oral Q4H PRN Karie Soda, MD   10 mg at 12/03/22 0817   pantoprazole (PROTONIX) EC tablet 40 mg  40 mg Oral Daily Andria Meuse, MD   40 mg at 12/03/22 0817   polycarbophil (FIBERCON) tablet 625 mg  625 mg Oral BID Karie Soda, MD   625 mg at 12/03/22 0817   rosuvastatin (CRESTOR) tablet 10 mg  10 mg Oral Daily Andria Meuse, MD   10 mg at 12/03/22 0817   simethicone (MYLICON) chewable tablet 40 mg  40 mg Oral Q6H PRN Andria Meuse, MD   40 mg at 12/02/22 0525   sodium chloride flush (NS) 0.9 % injection 3 mL  3 mL Intravenous Catha Gosselin, MD   3 mL at 12/02/22 2200   sodium chloride flush (NS) 0.9 % injection 3 mL  3 mL Intravenous PRN Karie Soda, MD       tamsulosin [facility]) capsule 0.8 mg  0.8 mg Oral Daily Andria Meuse, MD   0.8 mg at 12/03/22 0816   traMADol (ULTRAM) tablet 50 mg  50 mg Oral Q6H PRN Andria Meuse, MD   50 mg at 12/02/22 2346     Allergies  Allergen Reactions   Other Swelling    Hayfever,GRASS,DOGS,CATS    Wound Dressing Adhesive Rash    Skin tears around Port-a-cath, and ostomy     Signed:   Ardeth Sportsman, MD, FACS,  MASCRS Esophageal, Gastrointestinal & Colorectal Surgery Robotic and Minimally Invasive Surgery  [facility] [address] [place], [place] [address] [phone] Fax [phone] Main  CONTACT INFORMATION:  Weekday (9AM-5PM): Call [facility] main office at [phone]  Weeknight (5PM-9AM) or Weekend/Holiday: Check [email or web address] (password " [email or web address]") for General Surgery [facility] coverage  (Please, do not use SecureChat as it is not reliable communication to reach operating surgeons for immediate patient care given surgeries/outpatient duties/clinic/cross-coverage/off post-call which would lead to a delay in care.  Epic staff messaging available for outptient concerns, but may not be answered for 48 hours or more).     12/03/2022, 9:27 AM

## 2022-12-04 ENCOUNTER — Other Ambulatory Visit (HOSPITAL_COMMUNITY): Payer: Self-pay

## 2022-12-04 ENCOUNTER — Telehealth: Payer: Self-pay

## 2022-12-04 LAB — SURGICAL PATHOLOGY

## 2022-12-04 NOTE — Transitions of Care (Post Inpatient/ED Visit) (Signed)
12/04/2022  Name: [name] [name] MRN: [id number] DOB: 1960-10-09  Today's TOC FU Call Status: Today's TOC FU Call Status:: Successful TOC FU Call Competed TOC FU Call Complete Date: 12/04/22  Transition Care Management Follow-up Telephone Call Date of Discharge: 12/03/22 Discharge Facility: [facility]) Type of Discharge: Inpatient Admission Primary Inpatient Discharge Diagnosis:: closure of ileostomy How have you been since you were released from the hospital?: Better Any questions or concerns?: No  Items Reviewed: Did you receive and understand the discharge instructions provided?: Yes Medications obtained,verified, and reconciled?: Yes (Medications Reviewed) Any new allergies since your discharge?: No Dietary orders reviewed?: Yes Do you have support at home?: Yes People in Home: spouse  Medications Reviewed Today: Medications Reviewed Today     Reviewed by Karena Addison, LPN (Licensed Practical Nurse) on 12/04/22 at 1347  Med List Status: <None>   Medication Order Taking? Sig Documenting Provider Last Dose Status Informant  Continuous Blood Gluc Sensor (FREESTYLE LIBRE 3 SENSOR) MISC [id number] No Place 1 sensor on the skin every 14 days. Use to check glucose continuously.  DX: E11.65 Junie Spencer, FNP Taking Active Self  Dulaglutide (TRULICITY) 1.5 MG/0.5ML SOPN [id number] No Inject 1.5 mg into the skin once a week.  Patient taking differently: Inject 1.5 mg into the skin every Sunday.   Junie Spencer, [place] 11/19/2022 Active Self  empagliflozin (JARDIANCE) 25 MG TABS tablet [id number] No Take 1 tablet (25 mg total) by mouth daily before breakfast. Junie Spencer, FNP 11/30/2022 Active Self  fluticasone (FLONASE) 50 MCG/ACT nasal spray [id number] No Place 2 sprays into both nostrils daily. Junie Spencer, [place] 11/30/2022 Active   Insulin Pen Needle 29G X MISC [id number] No 1 Application by Does not apply route at bedtime. Junie Spencer, [place] 11/30/2022 Active  Self  levocetirizine (XYZAL) 5 MG tablet [id number] No Take 1 tablet (5 mg total) by mouth every evening. Junie Spencer, FNP 11/30/2022 Active   LORazepam (ATIVAN) 0.5 MG tablet [id number] No Take 1 tablet (0.5 mg total) by mouth every 8 (eight) hours as needed for anxiety. Rana Snare, NP 11/30/2022 Active Self  metFORMIN (GLUCOPHAGE) 500 MG tablet [id number] No Take 1 tablet (500 mg total) by mouth 2 (two) times daily with a meal. Junie Spencer, FNP 11/30/2022 Active Self  montelukast (SINGULAIR) 10 MG tablet [id number] No Take 1 tablet (10 mg total) by mouth at bedtime. Junie Spencer, [place] 11/30/2022 Active Self  naproxen sodium (ALEVE) 220 MG tablet [id number] No Take 440 mg by mouth 2 (two) times daily. [provider] Past Week Active Self  omeprazole (PRILOSEC) 40 MG capsule [id number] No Take 1 capsule (40 mg total) by mouth daily as needed. For acid reflux  Patient taking differently: Take 40 mg by mouth daily before breakfast.   Junie Spencer, FNP 12/01/2022 Active Self           Med Note Reuben Likes Aug 21, 2022 11:58 AM)    oxyCODONE-acetaminophen (PERCOCET/ROXICET) 5-325 MG tablet [id number]  Take 1 tablet by mouth every 6 (six) hours as needed for severe pain. Karie Soda, MD  Active   rosuvastatin (CRESTOR) 10 MG tablet [id number] No Take 1 tablet (10 mg total) by mouth daily. Junie Spencer, [place] 11/30/2022 Active Self  tamsulosin (FLOMAX) 0.4 MG CAPS capsule [id number] No Take 2 capsules (0.8 mg total) by mouth daily. Junie Spencer, [place] 12/01/2022 Active Self  Med List Note Rulon Abide, Corinna Capra, [facility]  04/26/22 1709): Xeloda filled through [facility]            Home Care and Equipment/Supplies: Were Home Health Services Ordered?: NA Any new equipment or medical supplies ordered?: NA  Functional Questionnaire: Do you need assistance with bathing/showering or dressing?: No Do you need assistance with meal preparation?: No Do you need assistance  with eating?: No Do you have difficulty maintaining continence: No Do you need assistance with getting out of bed/getting out of a chair/moving?: No Do you have difficulty managing or taking your medications?: No  Follow up appointments reviewed: PCP Follow-up appointment confirmed?: NA Specialist Hospital Follow-up appointment confirmed?: Yes Date of Specialist follow-up appointment?: 12/25/22 Follow-Up Specialty Provider:: Dr Cliffton Asters Do you need transportation to your follow-up appointment?: No Do you understand care options if your condition(s) worsen?: Yes-patient verbalized understanding    SIGNATURE Karena Addison, LPN [facility] Nurse Health Advisor Direct Dial [phone]

## 2022-12-11 IMAGING — US IR IMAGING GUIDED PORT INSERTION
1 series · 3 of 3 positions shown · non-contrast
Comparison: none

INDICATION: Colorectal cancer.

[Series 1: ir fluoro/shunt/fist · 3 of 3 slices shown]
[im 1/3]
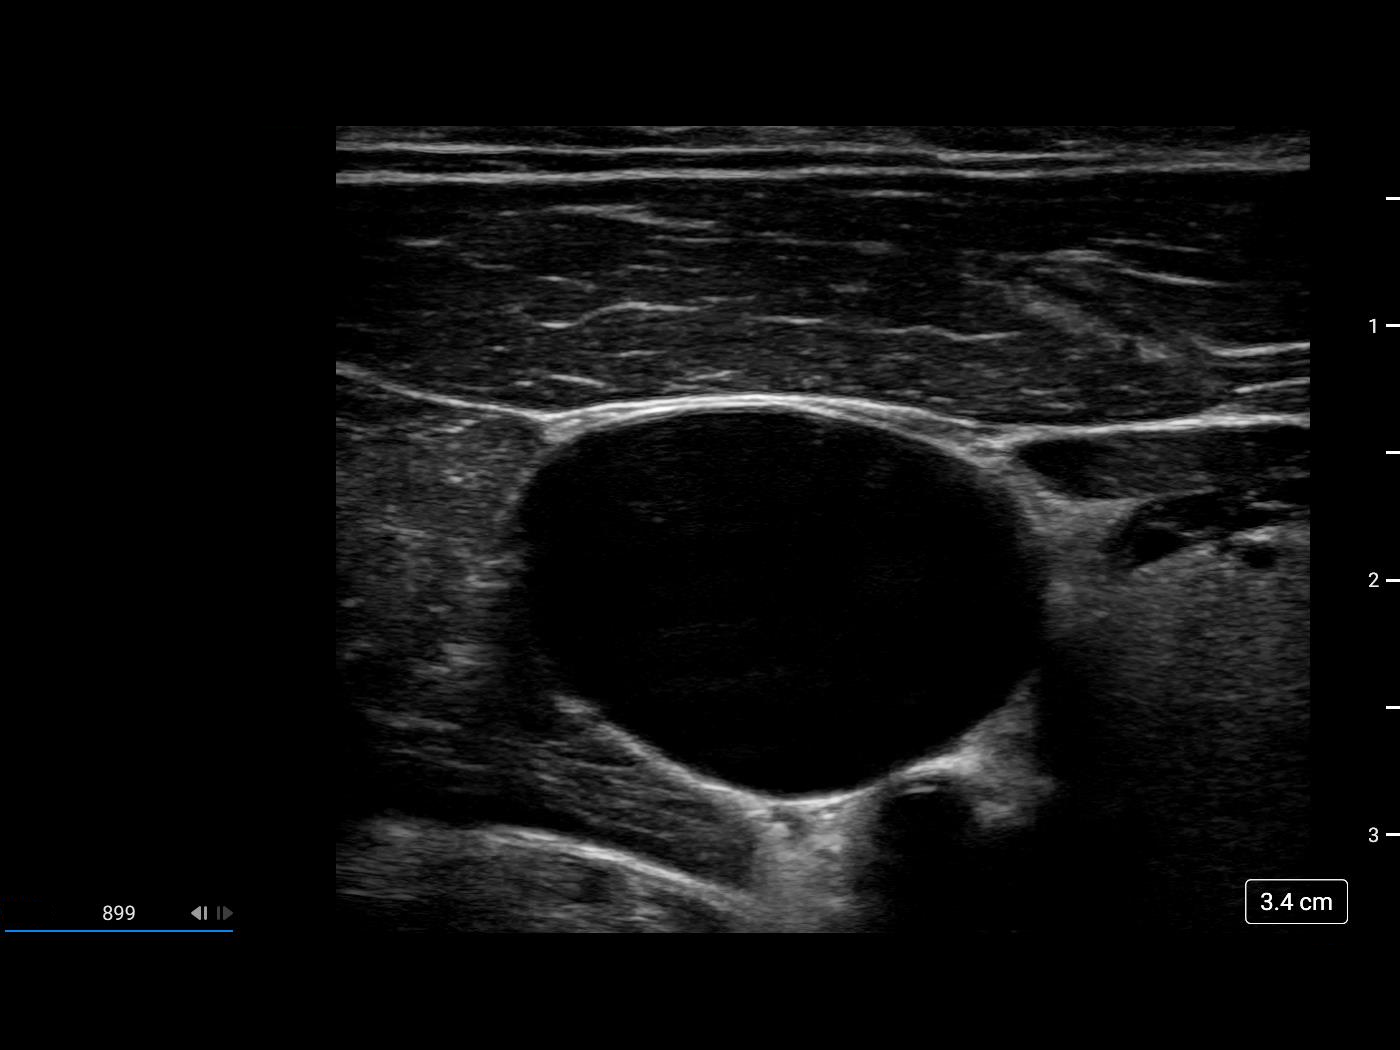
[im 2/3]
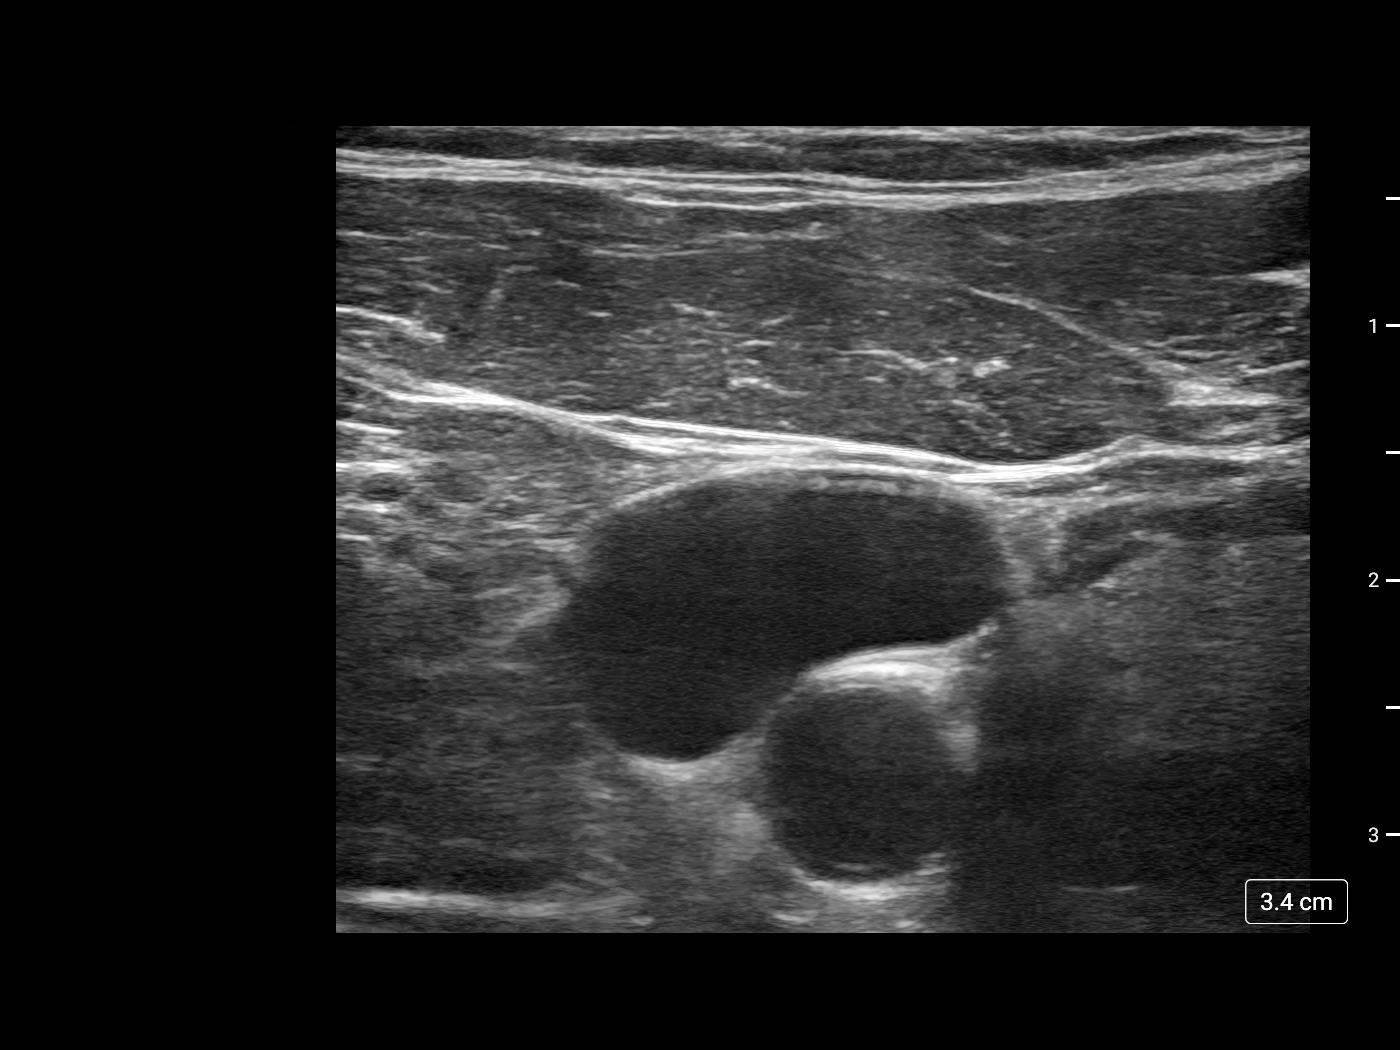
[im 3/3]
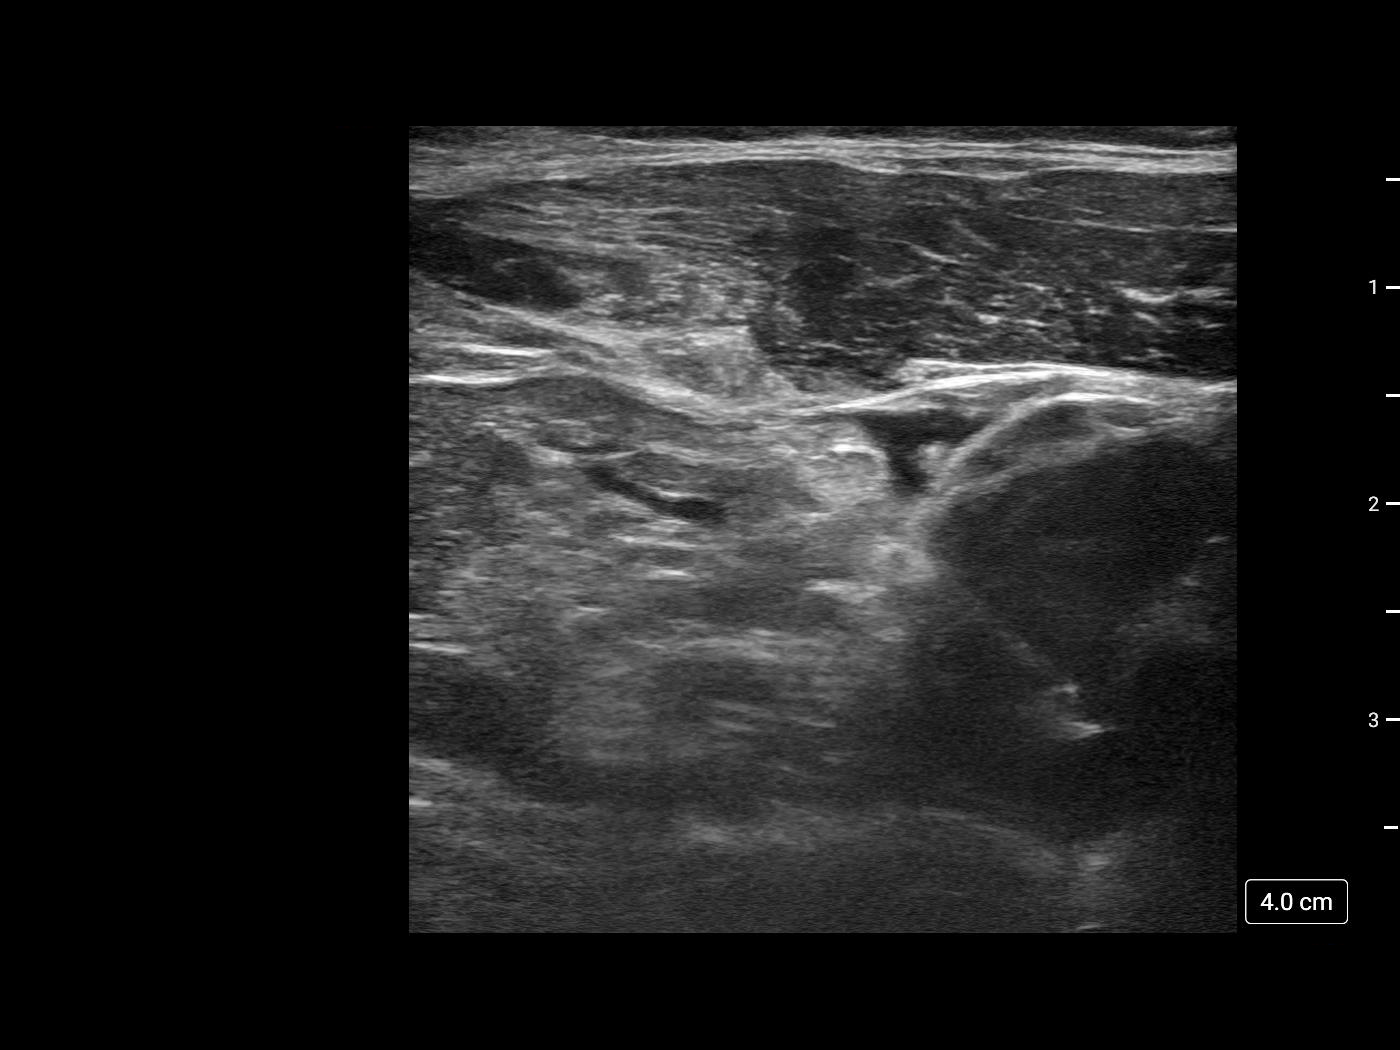

[3 of 3 positions shown; findings below may reference images not displayed]

EXAM:
IMPLANTED PORT A CATH PLACEMENT WITH ULTRASOUND AND FLUOROSCOPIC
GUIDANCE

MEDICATIONS:
None

ANESTHESIA/SEDATION:
Moderate (conscious) sedation was employed during this procedure. A
total of Versed 4 mg and Fentanyl 100 mcg was administered
intravenously.

Moderate Sedation Time: 26 minutes. The patient's level of
consciousness and vital signs were monitored continuously by
radiology nursing throughout the procedure under my direct
supervision.

FLUOROSCOPY TIME:  Fluoroscopic dose; 1 mGy

COMPLICATIONS:
None immediate.

PROCEDURE:
The procedure, risks, benefits, and alternatives were explained to
the patient. Questions regarding the procedure were encouraged and
answered. The patient understands and consents to the procedure.

The RIGHT neck and chest were prepped with chlorhexidine in a
sterile fashion, and a sterile drape was applied covering the
operative field. Maximum barrier sterile technique with sterile
gowns and gloves were used for the procedure. A timeout was
performed prior to the initiation of the procedure. Local anesthesia
was provided with 1% lidocaine with epinephrine.

After creating a small venotomy incision, a micropuncture kit was
utilized to access the internal jugular vein under direct, real-time
ultrasound guidance. Ultrasound image documentation was performed.
The microwire was kinked to measure appropriate catheter length.

A subcutaneous port pocket was then created along the upper chest
wall utilizing a combination of sharp and blunt dissection. The
pocket was irrigated with sterile saline. A single lumen ISP power
injectable port was chosen for placement. The 8 Fr catheter was
tunneled from the port pocket site to the venotomy incision. The
port was placed in the pocket. The external catheter was trimmed to
appropriate length. At the venotomy, an 8 Fr peel-away sheath was
placed over a guidewire under fluoroscopic guidance. The catheter
was then placed through the sheath and the sheath was removed. Final
catheter positioning was confirmed and documented with a
fluoroscopic spot radiograph. The port was accessed with [place] Jumper
needle, aspirated and flushed with heparinized saline.

The port pocket incision was closed with interrupted 3-0 Vicryl
suture then Dermabond was applied, including at the venotomy
incision. Dressings were placed. The patient tolerated the procedure
well without immediate post procedural complication.
IMPRESSION: Successful placement of a RIGHT internal jugular approach power
injectable Port-A-Cath.

The tip of the catheter is positioned within the superior cavoatrial
junction. The catheter is ready for immediate use.

## 2023-01-01 ENCOUNTER — Inpatient Hospital Stay: Payer: Commercial Managed Care - PPO

## 2023-01-01 ENCOUNTER — Ambulatory Visit (HOSPITAL_BASED_OUTPATIENT_CLINIC_OR_DEPARTMENT_OTHER)
Admission: RE | Admit: 2023-01-01 | Discharge: 2023-01-01 | Disposition: A | Discharge status: Home / Self Care | Payer: Commercial Managed Care - PPO | Source: Ambulatory Visit | Attending: Oncology | Admitting: Oncology

## 2023-01-01 ENCOUNTER — Inpatient Hospital Stay: Payer: Commercial Managed Care - PPO | Attending: Oncology

## 2023-01-01 DIAGNOSIS — C787 Secondary malignant neoplasm of liver and intrahepatic bile duct: Secondary | ICD-10-CM | POA: Insufficient documentation

## 2023-01-01 DIAGNOSIS — R2241 Localized swelling, mass and lump, right lower limb: Secondary | ICD-10-CM | POA: Diagnosis not present

## 2023-01-01 DIAGNOSIS — Z800 Family history of malignant neoplasm of digestive organs: Secondary | ICD-10-CM | POA: Insufficient documentation

## 2023-01-01 DIAGNOSIS — G620 Drug-induced polyneuropathy: Secondary | ICD-10-CM | POA: Insufficient documentation

## 2023-01-01 DIAGNOSIS — Z803 Family history of malignant neoplasm of breast: Secondary | ICD-10-CM | POA: Diagnosis not present

## 2023-01-01 DIAGNOSIS — G8929 Other chronic pain: Secondary | ICD-10-CM | POA: Insufficient documentation

## 2023-01-01 DIAGNOSIS — C19 Malignant neoplasm of rectosigmoid junction: Secondary | ICD-10-CM | POA: Insufficient documentation

## 2023-01-01 DIAGNOSIS — C20 Malignant neoplasm of rectum: Secondary | ICD-10-CM | POA: Insufficient documentation

## 2023-01-01 DIAGNOSIS — Z95828 Presence of other vascular implants and grafts: Secondary | ICD-10-CM

## 2023-01-01 LAB — BASIC METABOLIC PANEL - CANCER CENTER ONLY
Anion gap: 10 (ref 5–15)
BUN: 19 mg/dL (ref 8–23)
CO2: 22 mmol/L (ref 22–32)
Calcium: 9.7 mg/dL (ref 8.9–10.3)
Chloride: 103 mmol/L (ref 98–111)
Creatinine: 0.78 mg/dL (ref 0.61–1.24)
GFR, Estimated: >60 mL/min (ref >60)
Glucose, Bld: 136 mg/dL — ABNORMAL HIGH (ref 70–99)
Potassium: 3.9 mmol/L (ref 3.5–5.1)
Sodium: 135 mmol/L (ref 135–145)

## 2023-01-01 LAB — CEA (ACCESS): CEA (CHCC): 1.48 ng/mL (ref 0.00–5.00)

## 2023-01-01 MED ORDER — IOHEXOL 300 MG/ML  SOLN
100.0000 mL | Freq: Once | INTRAMUSCULAR | Status: AC | PRN
  Administered 2023-01-01: 100 mL via INTRAVENOUS

## 2023-01-01 MED ORDER — HEPARIN SOD (PORK) LOCK FLUSH 100 UNIT/ML IV SOLN
500.0000 [IU] | Freq: Once | INTRAVENOUS | Status: AC
  Administered 2023-01-01: 500 [IU] via INTRAVENOUS

## 2023-01-01 MED ORDER — SODIUM CHLORIDE 0.9% FLUSH
10.0000 mL | INTRAVENOUS | Status: DC | PRN
  Administered 2023-01-01: 10 mL via INTRAVENOUS

## 2023-01-01 NOTE — Patient Instructions (Signed)

## 2023-01-03 ENCOUNTER — Inpatient Hospital Stay: Payer: Commercial Managed Care - PPO | Admitting: Oncology

## 2023-01-03 VITALS — BP 132/87 | HR 89 | Temp 98.2°F | Resp 16 | Ht 72.0 in | Wt 248.3 lb

## 2023-01-03 DIAGNOSIS — C20 Malignant neoplasm of rectum: Secondary | ICD-10-CM | POA: Diagnosis not present

## 2023-01-03 DIAGNOSIS — C19 Malignant neoplasm of rectosigmoid junction: Secondary | ICD-10-CM | POA: Diagnosis not present
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
## 2023-01-04 ENCOUNTER — Other Ambulatory Visit: Payer: Self-pay | Admitting: *Deleted

## 2023-01-04 ENCOUNTER — Encounter: Payer: Self-pay | Admitting: *Deleted

## 2023-01-04 DIAGNOSIS — K219 Gastro-esophageal reflux disease without esophagitis: Secondary | ICD-10-CM

## 2023-01-04 DIAGNOSIS — N401 Enlarged prostate with lower urinary tract symptoms: Secondary | ICD-10-CM

## 2023-01-04 DIAGNOSIS — E1165 Type 2 diabetes mellitus with hyperglycemia: Secondary | ICD-10-CM

## 2023-01-04 MED ORDER — OMEPRAZOLE 40 MG PO CPDR
40.0000 mg | DELAYED_RELEASE_CAPSULE | Freq: Every day | ORAL | 0 refills | Status: DC | PRN
Start: 2023-01-04 — End: 2023-03-16

## 2023-01-04 MED ORDER — EMPAGLIFLOZIN 25 MG PO TABS
25.0000 mg | ORAL_TABLET | Freq: Every day | ORAL | 0 refills | Status: DC
Start: 2023-01-04 — End: 2023-03-16

## 2023-01-04 MED ORDER — METFORMIN HCL 500 MG PO TABS
500.0000 mg | ORAL_TABLET | Freq: Two times a day (BID) | ORAL | 0 refills | Status: DC
Start: 2023-01-04 — End: 2023-03-16

## 2023-01-04 MED ORDER — TAMSULOSIN HCL 0.4 MG PO CAPS
0.8000 mg | ORAL_CAPSULE | Freq: Every day | ORAL | 0 refills | Status: DC
Start: 2023-01-04 — End: 2023-03-16

## 2023-01-05 ENCOUNTER — Telehealth: Payer: Self-pay | Admitting: Oncology

## 2023-01-05 NOTE — Telephone Encounter (Signed)
Scheduled appointment per scheduling message. Patient is aware of the made appointment. 

## 2023-01-16 ENCOUNTER — Other Ambulatory Visit: Payer: Self-pay | Admitting: *Deleted

## 2023-01-16 MED ORDER — ROSUVASTATIN CALCIUM 10 MG PO TABS: 10.0000 mg | ORAL_TABLET | Freq: Every day | ORAL | 0 refills | Status: DC

## 2023-01-17 ENCOUNTER — Ambulatory Visit: Payer: Commercial Managed Care - PPO | Admitting: Nurse Practitioner

## 2023-01-19 ENCOUNTER — Encounter: Payer: Self-pay | Admitting: Oncology

## 2023-01-23 ENCOUNTER — Ambulatory Visit
Admission: RE | Admit: 2023-01-23 | Discharge: 2023-01-23 | Disposition: A | Discharge status: Home / Self Care | Payer: Commercial Managed Care - PPO | Source: Ambulatory Visit | Attending: Oncology | Admitting: Oncology

## 2023-01-23 DIAGNOSIS — C20 Malignant neoplasm of rectum: Secondary | ICD-10-CM

## 2023-01-23 MED ORDER — GADOPICLENOL 0.5 MMOL/ML IV SOLN
10.0000 mL | Freq: Once | INTRAVENOUS | Status: AC | PRN
  Administered 2023-01-23: 10 mL via INTRAVENOUS

## 2023-01-30 ENCOUNTER — Inpatient Hospital Stay: Payer: Commercial Managed Care - PPO | Admitting: Oncology

## 2023-01-30 ENCOUNTER — Inpatient Hospital Stay: Payer: Commercial Managed Care - PPO | Attending: Oncology | Admitting: Oncology

## 2023-01-30 ENCOUNTER — Encounter: Payer: Self-pay | Admitting: *Deleted

## 2023-01-30 VITALS — BP 124/69 | HR 90 | Temp 98.1°F | Resp 18 | Ht 72.0 in | Wt 252.0 lb

## 2023-01-30 DIAGNOSIS — C787 Secondary malignant neoplasm of liver and intrahepatic bile duct: Secondary | ICD-10-CM | POA: Diagnosis not present

## 2023-01-30 DIAGNOSIS — G620 Drug-induced polyneuropathy: Secondary | ICD-10-CM | POA: Insufficient documentation

## 2023-01-30 DIAGNOSIS — M549 Dorsalgia, unspecified: Secondary | ICD-10-CM | POA: Insufficient documentation

## 2023-01-30 DIAGNOSIS — Z803 Family history of malignant neoplasm of breast: Secondary | ICD-10-CM | POA: Insufficient documentation

## 2023-01-30 DIAGNOSIS — C19 Malignant neoplasm of rectosigmoid junction: Secondary | ICD-10-CM | POA: Insufficient documentation

## 2023-01-30 DIAGNOSIS — R2241 Localized swelling, mass and lump, right lower limb: Secondary | ICD-10-CM | POA: Diagnosis not present

## 2023-01-30 DIAGNOSIS — Z800 Family history of malignant neoplasm of digestive organs: Secondary | ICD-10-CM | POA: Insufficient documentation

## 2023-01-30 DIAGNOSIS — C20 Malignant neoplasm of rectum: Secondary | ICD-10-CM

## 2023-01-30 DIAGNOSIS — G8929 Other chronic pain: Secondary | ICD-10-CM | POA: Diagnosis not present
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
## 2023-01-30 NOTE — Progress Notes (Signed)
Per Dr. Truett Perna request: Email to [facility] pathology requesting [employer] testing on case #[id number] dated 09/01/2022

## 2023-02-01 ENCOUNTER — Other Ambulatory Visit: Payer: Self-pay | Admitting: *Deleted

## 2023-02-01 DIAGNOSIS — C20 Malignant neoplasm of rectum: Secondary | ICD-10-CM

## 2023-02-01 NOTE — Progress Notes (Signed)
Faxed referral order, facesheet and chart information to Dr. Derek Mound at [facility] [phone]. Notified radiology to please push over his 7/30 MRI and 7/08 CT images to [facility] system as well.

## 2023-02-06 ENCOUNTER — Encounter (HOSPITAL_COMMUNITY): Payer: Self-pay | Admitting: Oncology

## 2023-02-06 ENCOUNTER — Telehealth: Payer: Self-pay

## 2023-02-06 NOTE — Telephone Encounter (Signed)
Called pt and relayed message below per MD Truett Perna. Pt verbalizes understanding and agrees with plan of care.

## 2023-02-06 NOTE — Telephone Encounter (Signed)
-----   Message from Thornton Papas sent at 02/06/2023  3:29 PM EDT ----- Please call patient, molecular testing has returned.  He does not appear to be a candidate for immunotherapy or other targeted therapies, follow-up as scheduled.  We will review the details of the molecular testing at the next office visit

## 2023-02-07 ENCOUNTER — Encounter (HOSPITAL_COMMUNITY): Payer: Self-pay | Admitting: Oncology

## 2023-02-28 ENCOUNTER — Inpatient Hospital Stay: Payer: Commercial Managed Care - PPO | Attending: Oncology | Admitting: Oncology

## 2023-02-28 ENCOUNTER — Encounter: Payer: Self-pay | Admitting: Oncology

## 2023-02-28 VITALS — BP 111/87 | HR 85 | Temp 98.1°F | Resp 18 | Ht 72.0 in | Wt 252.4 lb

## 2023-02-28 DIAGNOSIS — Z86010 Personal history of colonic polyps: Secondary | ICD-10-CM | POA: Insufficient documentation

## 2023-02-28 DIAGNOSIS — Z801 Family history of malignant neoplasm of trachea, bronchus and lung: Secondary | ICD-10-CM | POA: Insufficient documentation

## 2023-02-28 DIAGNOSIS — M549 Dorsalgia, unspecified: Secondary | ICD-10-CM | POA: Insufficient documentation

## 2023-02-28 DIAGNOSIS — C787 Secondary malignant neoplasm of liver and intrahepatic bile duct: Secondary | ICD-10-CM | POA: Diagnosis not present

## 2023-02-28 DIAGNOSIS — Z800 Family history of malignant neoplasm of digestive organs: Secondary | ICD-10-CM | POA: Insufficient documentation

## 2023-02-28 DIAGNOSIS — Z5111 Encounter for antineoplastic chemotherapy: Secondary | ICD-10-CM | POA: Insufficient documentation

## 2023-02-28 DIAGNOSIS — G620 Drug-induced polyneuropathy: Secondary | ICD-10-CM | POA: Diagnosis not present

## 2023-02-28 DIAGNOSIS — C19 Malignant neoplasm of rectosigmoid junction: Secondary | ICD-10-CM | POA: Insufficient documentation

## 2023-02-28 DIAGNOSIS — Z803 Family history of malignant neoplasm of breast: Secondary | ICD-10-CM | POA: Insufficient documentation

## 2023-02-28 DIAGNOSIS — C20 Malignant neoplasm of rectum: Secondary | ICD-10-CM | POA: Diagnosis not present

## 2023-02-28 DIAGNOSIS — R2231 Localized swelling, mass and lump, right upper limb: Secondary | ICD-10-CM | POA: Diagnosis not present
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
## 2023-02-28 NOTE — Progress Notes (Signed)
DISCONTINUE OFF PATHWAY REGIMEN - Colorectal   [id number] (Leucovorin IV D1 + Fluorouracil IV D1/CIV D1,2 + Oxaliplatin IV D1) q14 Days:   A cycle is every 14 days:     Oxaliplatin      Leucovorin      Fluorouracil      Fluorouracil   **Always confirm dose/schedule in your pharmacy ordering system**  REASON: Disease Progression PRIOR TREATMENT: Off Pathway: [id number] (Leucovorin IV D1 + Fluorouracil IV D1/CIV D1,2 + Oxaliplatin IV D1) q14 Days TREATMENT RESPONSE: Partial Response (PR)  START OFF PATHWAY REGIMEN - Colorectal   [id number]:[id number] (Leucovorin IV D1 + Fluorouracil IV D1/CIV D1,2 + Irinotecan IV D1) q14 Days:   A cycle is every 14 days:     Irinotecan      Leucovorin      Fluorouracil      Fluorouracil   **Always confirm dose/schedule in your pharmacy ordering system**  Patient Characteristics: Distant Metastases, Resectable, Neoadjuvant Therapy Planned Tumor Location: Rectal Therapeutic Status: Distant Metastases  Intent of Therapy: Curative Intent, Discussed with Patient

## 2023-03-01 ENCOUNTER — Other Ambulatory Visit: Payer: Self-pay

## 2023-03-12 NOTE — Progress Notes (Signed)
Pharmacist Chemotherapy Monitoring - Initial Assessment    Anticipated start date: 03/19/23   The following has been reviewed per standard work regarding the patient's treatment regimen: The patient's diagnosis, treatment plan and drug doses, and organ/hematologic function Lab orders and baseline tests specific to treatment regimen  The treatment plan start date, drug sequencing, and pre-medications Prior authorization status  Patient's documented medication list, including drug-drug interaction screen and prescriptions for anti-emetics and supportive care specific to the treatment regimen The drug concentrations, fluid compatibility, administration routes, and timing of the medications to be used The patient's access for treatment and lifetime cumulative dose history, if applicable  The patient's medication allergies and previous infusion related reactions, if applicable   Changes made to treatment plan:  N/A  Follow up needed:  N/A   Daylene Katayama, RPH, 03/12/2023  1:15 PM

## 2023-03-13 ENCOUNTER — Other Ambulatory Visit: Payer: Self-pay | Admitting: Oncology

## 2023-03-16 ENCOUNTER — Ambulatory Visit: Payer: Commercial Managed Care - PPO | Admitting: Family

## 2023-03-16 ENCOUNTER — Encounter: Payer: Self-pay | Admitting: Family

## 2023-03-16 VITALS — BP 120/78 | HR 72 | Temp 97.4°F | Ht 72.0 in | Wt 253.6 lb

## 2023-03-16 DIAGNOSIS — E1122 Type 2 diabetes mellitus with diabetic chronic kidney disease: Secondary | ICD-10-CM

## 2023-03-16 DIAGNOSIS — J4520 Mild intermittent asthma, uncomplicated: Secondary | ICD-10-CM

## 2023-03-16 DIAGNOSIS — E1169 Type 2 diabetes mellitus with other specified complication: Secondary | ICD-10-CM

## 2023-03-16 DIAGNOSIS — N401 Enlarged prostate with lower urinary tract symptoms: Secondary | ICD-10-CM

## 2023-03-16 DIAGNOSIS — K219 Gastro-esophageal reflux disease without esophagitis: Secondary | ICD-10-CM | POA: Diagnosis not present

## 2023-03-16 DIAGNOSIS — G8929 Other chronic pain: Secondary | ICD-10-CM

## 2023-03-16 DIAGNOSIS — Z7984 Long term (current) use of oral hypoglycemic drugs: Secondary | ICD-10-CM

## 2023-03-16 DIAGNOSIS — E785 Hyperlipidemia, unspecified: Secondary | ICD-10-CM

## 2023-03-16 DIAGNOSIS — N183 Chronic kidney disease, stage 3 unspecified: Secondary | ICD-10-CM

## 2023-03-16 DIAGNOSIS — M545 Low back pain, unspecified: Secondary | ICD-10-CM

## 2023-03-16 DIAGNOSIS — R3914 Feeling of incomplete bladder emptying: Secondary | ICD-10-CM

## 2023-03-16 LAB — BAYER DCA HB A1C WAIVED: HB A1C (BAYER DCA - WAIVED): 7.1 % — ABNORMAL HIGH (ref 4.8–5.6)

## 2023-03-16 MED ORDER — INSULIN LISPRO 100 UNIT/ML IJ SOLN: INTRAMUSCULAR | 2 refills | Status: DC

## 2023-03-16 MED ORDER — TRULICITY 1.5 MG/0.5ML ~~LOC~~ SOAJ: 1.5000 mg | SUBCUTANEOUS | 2 refills | Status: DC

## 2023-03-16 MED ORDER — TOUJEO SOLOSTAR 300 UNIT/ML ~~LOC~~ SOPN
8.0000 [IU] | PEN_INJECTOR | Freq: Every evening | SUBCUTANEOUS | 2 refills | Status: DC
Start: 2023-03-16 — End: 2023-05-30

## 2023-03-16 MED ORDER — LEVOCETIRIZINE DIHYDROCHLORIDE 5 MG PO TABS: 5.0000 mg | ORAL_TABLET | Freq: Every evening | ORAL | 1 refills | Status: DC

## 2023-03-16 MED ORDER — OMEPRAZOLE 40 MG PO CPDR
40.0000 mg | DELAYED_RELEASE_CAPSULE | Freq: Every day | ORAL | 0 refills | Status: DC | PRN
Start: 2023-03-16 — End: 2023-07-12

## 2023-03-16 MED ORDER — METFORMIN HCL 500 MG PO TABS
500.0000 mg | ORAL_TABLET | Freq: Two times a day (BID) | ORAL | 0 refills | Status: DC
Start: 2023-03-16 — End: 2023-07-02

## 2023-03-16 MED ORDER — FREESTYLE LIBRE 3 SENSOR MISC
11 refills | Status: DC
Start: 2023-03-16 — End: 2024-01-02

## 2023-03-16 MED ORDER — INSULIN PEN NEEDLE 29G X 5MM MISC
1.0000 | Freq: Every day | 2 refills | Status: DC
Start: 2023-03-16 — End: 2023-06-18

## 2023-03-16 MED ORDER — EMPAGLIFLOZIN 25 MG PO TABS
25.0000 mg | ORAL_TABLET | Freq: Every day | ORAL | 0 refills | Status: DC
Start: 2023-03-16 — End: 2023-07-25

## 2023-03-16 MED ORDER — FLUTICASONE PROPIONATE 50 MCG/ACT NA SUSP: 2.0000 | Freq: Every day | NASAL | 2 refills | Status: DC

## 2023-03-16 MED ORDER — TAMSULOSIN HCL 0.4 MG PO CAPS
0.8000 mg | ORAL_CAPSULE | Freq: Every day | ORAL | 0 refills | Status: DC
Start: 2023-03-16 — End: 2023-06-29

## 2023-03-16 MED ORDER — ROSUVASTATIN CALCIUM 10 MG PO TABS
10.0000 mg | ORAL_TABLET | Freq: Every day | ORAL | 0 refills | Status: DC
Start: 2023-03-16 — End: 2023-06-29

## 2023-03-16 MED FILL — Dexamethasone Sodium Phosphate Inj 100 MG/10ML: INTRAMUSCULAR | Qty: 1 | Status: AC

## 2023-03-16 NOTE — Patient Instructions (Signed)
Sliding scale for Humalog: 60 - 124 No Coverage  125 - 150 2 units of Humalog Insulin subq  151 - 200 4 units of Humalog Insulin subq  201 - 250 6 units of Humalog Insulin subq  251 - 300 8 units of Humalog Insulin subq  301 - 350 10 units of Humalog Insulin subq  351 - 400 12 units of Humalog Insulin subq

## 2023-03-16 NOTE — Progress Notes (Signed)
Subjective:    Patient ID: [name] [name], male    DOB: 31-Oct-1960, 62 y.o.   MRN: [id number]  Chief Complaint  Patient presents with   Medical Management of Chronic Issues   Pt presents to the office today for CPE and chronic follow up.    He had for Spinal Cord stimulator. He states this has been life changing.    He was diagnosed Rectal Cancer and completed chemo and radiation. He had ostomy reversed in June. His cancer spread to liver and is starting chemo Monday.   Asthma He complains of hoarse voice. There is no cough or wheezing. This is a chronic problem. The current episode started more than 1 year ago. The problem occurs intermittently. The problem has been resolved. Pertinent negatives include no heartburn or rhinorrhea. He reports moderate improvement on treatment. His past medical history is significant for asthma.  Gastroesophageal Reflux He complains of belching and a hoarse voice. He reports no coughing, no heartburn or no wheezing. This is a chronic problem. The current episode started more than 1 year ago. The problem occurs rarely. He has tried a PPI for the symptoms. The treatment provided moderate relief.  Diabetes He presents for his follow-up diabetic visit. He has type 2 diabetes mellitus. Associated symptoms include foot paresthesias. Pertinent negatives for diabetes include no blurred vision. Diabetic complications include peripheral neuropathy. Risk factors for coronary artery disease include dyslipidemia, diabetes mellitus, hypertension and sedentary lifestyle. He is following a diabetic diet. His overall blood glucose range is 110-130 mg/dl. Eye exam is not current.  Back Pain This is a chronic problem. The current episode started more than 1 year ago. The problem occurs intermittently. The problem has been waxing and waning since onset. The pain is present in the lumbar spine. The quality of the pain is described as aching. The pain is at a severity of 2/10.  The pain is mild. Risk factors include obesity.  Benign Prostatic Hypertrophy This is a chronic problem. The current episode started more than 1 year ago. Irritative symptoms include nocturia (1). Past treatments include tamsulosin.  Hyperlipidemia This is a chronic problem. The current episode started more than 1 year ago. Exacerbating diseases include obesity. Current antihyperlipidemic treatment includes statins. The current treatment provides moderate improvement of lipids. Risk factors for coronary artery disease include dyslipidemia, diabetes mellitus, male sex, hypertension and a sedentary lifestyle.      Review of Systems  HENT:  Positive for hoarse voice. Negative for rhinorrhea.   Eyes:  Negative for blurred vision.  Respiratory:  Negative for cough and wheezing.   Gastrointestinal:  Negative for heartburn.  Genitourinary:  Positive for nocturia (1).  Musculoskeletal:  Positive for back pain.  All other systems reviewed and are negative.  Family History  Problem Relation Age of Onset   Diabetes Mother    Breast cancer Mother 55   Diabetes Father        type II   Colon polyps Sister        11+ polyps   Other Brother        reportedly negative genetic testing   Diabetes Brother    Colon cancer Maternal Aunt    Colon cancer Maternal Uncle        x2   Colon cancer Maternal Uncle        x3   Colon cancer Maternal Uncle    Colon cancer Maternal Uncle    Colon cancer Maternal Uncle    Colon  cancer Maternal Uncle    Other Maternal Uncle        farm accident as a teen   Colon cancer Maternal Grandmother    Cancer Maternal Grandmother        breast, colon    Diabetes Paternal Grandfather    Colon cancer Cousin        <50   Colon cancer Cousin        maternal cousin's children   Esophageal cancer Neg Hx    Rectal cancer Neg Hx    Stomach cancer Neg Hx    Social History   Socioeconomic History   Marital status: Married    Spouse name: Not on file   Number of  children: Not on file   Years of education: Not on file   Highest education level: Not on file  Occupational History   Not on file  Tobacco Use   Smoking status: Former    Current packs/day: 0.00    Types: Cigarettes    Quit date: 1987    Years since quitting: 37.7    Passive exposure: Never   Smokeless tobacco: Never  Vaping Use   Vaping status: Never Used  Substance and Sexual Activity   Alcohol use: Not Currently    Comment: occasional beer   Drug use: No   Sexual activity: Yes  Other Topics Concern   Not on file  Social History Narrative   Not on file   Social Determinants of Health   Financial Resource Strain: Patient Declined (11/12/2022)   Overall Financial Resource Strain (CARDIA)    Difficulty of Paying Living Expenses: Patient declined  Food Insecurity: Patient Declined (11/12/2022)   Hunger Vital Sign    Worried About Running Out of Food in the Last Year: Patient declined    Ran Out of Food in the Last Year: Patient declined  Transportation Needs: Patient Declined (11/12/2022)   PRAPARE - Administrator, Civil Service (Medical): Patient declined    Lack of Transportation (Non-Medical): Patient declined  Physical Activity: Not on file  Stress: Stress Concern Present (11/22/2021)   [name] of Occupational Health - Occupational Stress Questionnaire    Feeling of Stress : To some extent  Social Connections: Unknown (11/12/2022)   Social Connection and Isolation Panel [NHANES]    Frequency of Communication with Friends and Family: Patient declined    Frequency of Social Gatherings with Friends and Family: Patient declined    Attends Religious Services: Patient declined    Database administrator or Organizations: Patient declined    Attends Banker Meetings: Not on file    Marital Status: Patient declined       Objective:   Physical Exam Vitals reviewed.  Constitutional:      General: He is not in acute distress.     Appearance: He is well-developed. He is obese.  HENT:     Head: Normocephalic.     Right Ear: Tympanic membrane normal.     Left Ear: Tympanic membrane normal.  Eyes:     General:        Right eye: No discharge.        Left eye: No discharge.     Pupils: Pupils are equal, round, and reactive to light.  Neck:     Thyroid: No thyromegaly.  Cardiovascular:     Rate and Rhythm: Normal rate and regular rhythm.     Heart sounds: Normal heart sounds. No murmur heard. Pulmonary:  Effort: Pulmonary effort is normal. No respiratory distress.     Breath sounds: Normal breath sounds. No wheezing.  Abdominal:     General: Bowel sounds are normal. There is no distension.     Palpations: Abdomen is soft.     Tenderness: There is no abdominal tenderness.  Musculoskeletal:        General: No tenderness. Normal range of motion.     Cervical back: Normal range of motion and neck supple.  Skin:    General: Skin is warm and dry.     Findings: No erythema or rash.  Neurological:     Mental Status: He is alert and oriented to person, place, and time.     Cranial Nerves: No cranial nerve deficit.     Deep Tendon Reflexes: Reflexes are normal and symmetric.  Psychiatric:        Behavior: Behavior normal.        Thought Content: Thought content normal.        Judgment: Judgment normal.       BP 120/78   Pulse 72   Temp (!) 97.4 F (36.3 C) (Temporal)   Ht 6' (1.829 m)   Wt 253 lb 9.6 oz (115 kg)   SpO2 94%   BMI 34.39 kg/m      Assessment & Plan:   Morse Fradette Fishman comes in today with chief complaint of Medical Management of Chronic Issues   Diagnosis and orders addressed:  1. Diabetes mellitus with stage 3 chronic kidney disease (HCC) -Pt starting chemo on Monday and will be given steroids. Will refill his Humalog and Toujeo to start once he starts the steroids Low carb diet  - Continuous Glucose Sensor (FREESTYLE LIBRE 3 SENSOR) MISC; Place 1 sensor on the skin every 14  days. Use to check glucose continuously.  DX: E11.65  Dispense: 2 each; Refill: 11 - Dulaglutide (TRULICITY) 1.5 MG/0.5ML SOPN; Inject 1.5 mg into the skin once a week.  Dispense: 6 mL; Refill: 2 - empagliflozin (JARDIANCE) 25 MG TABS tablet; Take 1 tablet (25 mg total) by mouth daily before breakfast.  Dispense: 90 tablet; Refill: 0 - insulin glargine, 1 Unit Dial, (TOUJEO SOLOSTAR) 300 UNIT/ML Solostar Pen; Inject 8 Units into the skin at bedtime.  Dispense: 1.5 mL; Refill: 2 - insulin lispro (HUMALOG) 100 UNIT/ML injection; Inject into the skin 3 (three) times daily before meals. Use sliding scale below 60 - 124 No Coverage 125 - 150 2 units of Humalog Insulin subq 151 - 200 4 units of Humalog Insulin subq 201 - 250 6 units of Humalog Insulin subq 251 - 300 8 units of Humalog Insulin subq 301 - 350 10 units of Humalog Insulin subq 351 - 400 12 units of Humalog Insulin subq  Dispense: 10 mL; Refill: 2 - Insulin Pen Needle 29G X MISC; 1 Application by Does not apply route at bedtime.  Dispense: 100 each; Refill: 2 - metFORMIN (GLUCOPHAGE) 500 MG tablet; Take 1 tablet (500 mg total) by mouth 2 (two) times daily with a meal.  Dispense: 180 tablet; Refill: 0 - Bayer DCA Hb A1c Waived  2. Uncontrolled type 2 diabetes mellitus with hyperglycemia (HCC)  3. Type 2 diabetes mellitus with other specified complication, without long-term current use of insulin (HCC) - Dulaglutide (TRULICITY) 1.5 MG/0.5ML SOPN; Inject 1.5 mg into the skin once a week.  Dispense: 6 mL; Refill: 2  4. Gastroesophageal reflux disease, unspecified whether esophagitis present - omeprazole (PRILOSEC) 40 MG capsule; Take 1  capsule (40 mg total) by mouth daily as needed. For acid reflux  Dispense: 90 capsule; Refill: 0  5. Benign prostatic hyperplasia with incomplete bladder emptying - tamsulosin (FLOMAX) 0.4 MG CAPS capsule; Take 2 capsules (0.8 mg total) by mouth daily.  Dispense: 180 capsule; Refill: 0  6. Mild intermittent  asthma, unspecified whether complicated  7. Chronic low back pain, unspecified back pain laterality, unspecified whether sciatica present  8. Hyperlipidemia associated with type 2 diabetes mellitus (HCC) - rosuvastatin (CRESTOR) 10 MG tablet; Take 1 tablet (10 mg total) by mouth daily.  Dispense: 90 tablet; Refill: 0   Labs pending Continue current medications  Keep follow up with Oncologists  Health Maintenance reviewed Diet and exercise encouraged  Follow up plan: 6 months   Jannifer Rodney, FNP

## 2023-03-17 ENCOUNTER — Other Ambulatory Visit: Payer: Self-pay

## 2023-03-19 ENCOUNTER — Ambulatory Visit: Payer: Commercial Managed Care - PPO | Admitting: Nurse Practitioner

## 2023-03-19 ENCOUNTER — Inpatient Hospital Stay: Payer: Commercial Managed Care - PPO

## 2023-03-19 ENCOUNTER — Other Ambulatory Visit: Payer: Commercial Managed Care - PPO

## 2023-03-19 VITALS — BP 126/80 | HR 85 | Temp 98.0°F | Resp 18 | Ht 72.0 in | Wt 257.0 lb

## 2023-03-19 DIAGNOSIS — C20 Malignant neoplasm of rectum: Secondary | ICD-10-CM

## 2023-03-19 DIAGNOSIS — Z5111 Encounter for antineoplastic chemotherapy: Secondary | ICD-10-CM | POA: Diagnosis not present

## 2023-03-19 LAB — CBC WITH DIFFERENTIAL (CANCER CENTER ONLY)
Abs Immature Granulocytes: 0.01 10*3/uL (ref 0.00–0.07)
Basophils Absolute: 0.1 10*3/uL (ref 0.0–0.1)
Basophils Relative: 2 %
Eosinophils Absolute: 0.2 10*3/uL (ref 0.0–0.5)
Eosinophils Relative: 4 %
HCT: 44.2 % (ref 39.0–52.0)
Hemoglobin: 14.5 g/dL (ref 13.0–17.0)
Immature Granulocytes: 0 %
Lymphocytes Relative: 11 %
Lymphs Abs: 0.5 10*3/uL — ABNORMAL LOW (ref 0.7–4.0)
MCH: 27.3 pg (ref 26.0–34.0)
MCHC: 32.8 g/dL (ref 30.0–36.0)
MCV: 83.2 fL (ref 80.0–100.0)
Monocytes Absolute: 0.4 10*3/uL (ref 0.1–1.0)
Monocytes Relative: 8 %
Neutro Abs: 3.5 10*3/uL (ref 1.7–7.7)
Neutrophils Relative %: 75 %
Platelet Count: 178 10*3/uL (ref 150–400)
RBC: 5.31 MIL/uL (ref 4.22–5.81)
RDW: 14.9 % (ref 11.5–15.5)
WBC Count: 4.6 10*3/uL (ref 4.0–10.5)
nRBC: 0 % (ref 0.0–0.2)

## 2023-03-19 LAB — CMP (CANCER CENTER ONLY)
ALT: 25 U/L (ref 0–44)
AST: 18 U/L (ref 15–41)
Albumin: 4.3 g/dL (ref 3.5–5.0)
Alkaline Phosphatase: 85 U/L (ref 38–126)
Anion gap: 9 (ref 5–15)
BUN: 15 mg/dL (ref 8–23)
CO2: 22 mmol/L (ref 22–32)
Calcium: 9.2 mg/dL (ref 8.9–10.3)
Chloride: 105 mmol/L (ref 98–111)
Creatinine: 0.9 mg/dL (ref 0.61–1.24)
GFR, Estimated: >60 mL/min (ref >60)
Glucose, Bld: 179 mg/dL — ABNORMAL HIGH (ref 70–99)
Potassium: 4.2 mmol/L (ref 3.5–5.1)
Sodium: 136 mmol/L (ref 135–145)
Total Bilirubin: 0.4 mg/dL (ref 0.3–1.2)
Total Protein: 7.1 g/dL (ref 6.5–8.1)

## 2023-03-19 LAB — CEA (ACCESS): CEA (CHCC): 3.39 ng/mL (ref 0.00–5.00)

## 2023-03-19 MED ORDER — SODIUM CHLORIDE 0.9 % IV SOLN
300.0000 mg/m2 | Freq: Once | INTRAVENOUS | Status: AC
  Administered 2023-03-19: 724 mg via INTRAVENOUS
  Filled 2023-03-19: qty 36.2

## 2023-03-19 MED ORDER — SODIUM CHLORIDE 0.9 % IV SOLN
1440.0000 mg/m2 | INTRAVENOUS | Status: DC
  Administered 2023-03-19: 3500 mg via INTRAVENOUS
  Filled 2023-03-19: qty 70

## 2023-03-19 MED ORDER — SODIUM CHLORIDE 0.9 % IV SOLN
10.0000 mg | Freq: Once | INTRAVENOUS | Status: AC
  Administered 2023-03-19: 10 mg via INTRAVENOUS
  Filled 2023-03-19: qty 1

## 2023-03-19 MED ORDER — ATROPINE SULFATE 1 MG/ML IV SOLN
0.5000 mg | Freq: Once | INTRAVENOUS | Status: AC | PRN
  Administered 2023-03-19: 0.5 mg via INTRAVENOUS
  Filled 2023-03-19: qty 1

## 2023-03-19 MED ORDER — SODIUM CHLORIDE 0.9 % IV SOLN: Freq: Once | INTRAVENOUS | Status: AC

## 2023-03-19 MED ORDER — SODIUM CHLORIDE 0.9 % IV SOLN
125.0000 mg/m2 | Freq: Once | INTRAVENOUS | Status: AC
  Administered 2023-03-19: 300 mg via INTRAVENOUS
  Filled 2023-03-19: qty 15

## 2023-03-19 MED ORDER — PALONOSETRON HCL INJECTION 0.25 MG/5ML
0.2500 mg | Freq: Once | INTRAVENOUS | Status: AC
  Administered 2023-03-19: 0.25 mg via INTRAVENOUS
  Filled 2023-03-19: qty 5

## 2023-03-19 NOTE — Patient Instructions (Signed)
[facility]   Discharge Instructions: Thank you for choosing [facility] to provide your oncology and hematology care.   If you have a lab appointment with the Cancer Center, please go directly to the Cancer Center and check in at the registration area.   Wear comfortable clothing and clothing appropriate for easy access to any Portacath or PICC line.   We strive to give you quality time with your provider. You may need to reschedule your appointment if you arrive late (15 or more minutes).  Arriving late affects you and other patients whose appointments are after yours.  Also, if you miss three or more appointments without notifying the office, you may be dismissed from the clinic at the provider's discretion.      For prescription refill requests, have your pharmacy contact our office and allow 72 hours for refills to be completed.    Today you received the following chemotherapy and/or immunotherapy agents Irinotecan (CAMPTOSAR), Leucovorin & Flourouracil (ADRUCIL).      To help prevent nausea and vomiting after your treatment, we encourage you to take your nausea medication as directed.  BELOW ARE SYMPTOMS THAT SHOULD BE REPORTED IMMEDIATELY: *FEVER GREATER THAN 100.4 F (38 C) OR HIGHER *CHILLS OR SWEATING *NAUSEA AND VOMITING THAT IS NOT CONTROLLED WITH YOUR NAUSEA MEDICATION *UNUSUAL SHORTNESS OF BREATH *UNUSUAL BRUISING OR BLEEDING *URINARY PROBLEMS (pain or burning when urinating, or frequent urination) *BOWEL PROBLEMS (unusual diarrhea, constipation, pain near the anus) TENDERNESS IN MOUTH AND THROAT WITH OR WITHOUT PRESENCE OF ULCERS (sore throat, sores in mouth, or a toothache) UNUSUAL RASH, SWELLING OR PAIN  UNUSUAL VAGINAL DISCHARGE OR ITCHING   Items with * indicate a potential emergency and should be followed up as soon as possible or go to the Emergency Department if any problems should occur.  Please show the  CHEMOTHERAPY ALERT CARD or IMMUNOTHERAPY ALERT CARD at check-in to the Emergency Department and triage nurse.  Should you have questions after your visit or need to cancel or reschedule your appointment, please contact [facility]  Dept: [phone]  and follow the prompts.  Office hours are 8:00 a.m. to 4:30 p.m. Monday - Friday. Please note that voicemails left after 4:00 p.m. may not be returned until the following business day.  We are closed weekends and major holidays. You have access to a nurse at all times for urgent questions. Please call the main number to the clinic Dept: [phone] and follow the prompts.   For any non-urgent questions, you may also contact your provider using MyChart. We now offer e-Visits for anyone 28 and older to request care online for non-urgent symptoms. For details visit [email or web address].   Also download the MyChart app! Go to the app store, search "MyChart", open the app, select [place], and log in with your MyChart username and password.  Irinotecan Injection What is this medication? IRINOTECAN (ir in oh TEE kan) treats some types of cancer. It works by slowing down the growth of cancer cells. This medicine may be used for other purposes; ask your health care provider or pharmacist if you have questions. COMMON BRAND NAME(S): Camptosar What should I tell my care team before I take this medication? They need to know if you have any of these conditions: Dehydration Diarrhea Infection, especially a viral infection, such as chickenpox, cold sores, herpes Liver disease Low blood cell levels (white cells, red cells, and platelets) Low levels of electrolytes,  such as calcium, magnesium, or potassium in your blood Recent or ongoing radiation An unusual or allergic reaction to irinotecan, other medications, foods, dyes, or preservatives If you or your partner are pregnant or trying to get  pregnant Breast-feeding How should I use this medication? This medication is injected into a vein. It is given by your care team in a hospital or clinic setting. Talk to your care team about the use of this medication in children. Special care may be needed. Overdosage: If you think you have taken too much of this medicine contact a poison control center or emergency room at once. NOTE: This medicine is only for you. Do not share this medicine with others. What if I miss a dose? Keep appointments for follow-up doses. It is important not to miss your dose. Call your care team if you are unable to keep an appointment. What may interact with this medication? Do not take this medication with any of the following: Cobicistat Itraconazole This medication may also interact with the following: Certain antibiotics, such as clarithromycin, rifampin, rifabutin Certain antivirals for HIV or AIDS Certain medications for fungal infections, such as ketoconazole, posaconazole, voriconazole Certain medications for seizures, such as carbamazepine, phenobarbital, phenytoin Gemfibrozil Nefazodone St. John's wort This list may not describe all possible interactions. Give your health care provider a list of all the medicines, herbs, non-prescription drugs, or dietary supplements you use. Also tell them if you smoke, drink alcohol, or use illegal drugs. Some items may interact with your medicine. What should I watch for while using this medication? Your condition will be monitored carefully while you are receiving this medication. You may need blood work while taking this medication. This medication may make you feel generally unwell. This is not uncommon as chemotherapy can affect healthy cells as well as cancer cells. Report any side effects. Continue your course of treatment even though you feel ill unless your care team tells you to stop. This medication can cause serious side effects. To reduce the risk, your  care team may give you other medications to take before receiving this one. Be sure to follow the directions from your care team. This medication may affect your coordination, reaction time, or judgement. Do not drive or operate machinery until you know how this medication affects you. Sit up or stand slowly to reduce the risk of dizzy or fainting spells. Drinking alcohol with this medication can increase the risk of these side effects. This medication may increase your risk of getting an infection. Call your care team for advice if you get a fever, chills, sore throat, or other symptoms of a cold or flu. Do not treat yourself. Try to avoid being around people who are sick. Avoid taking medications that contain aspirin, acetaminophen, ibuprofen, naproxen, or ketoprofen unless instructed by your care team. These medications may hide a fever. This medication may increase your risk to bruise or bleed. Call your care team if you notice any unusual bleeding. Be careful brushing or flossing your teeth or using a toothpick because you may get an infection or bleed more easily. If you have any dental work done, tell your dentist you are receiving this medication. Talk to your care team if you or your partner are pregnant or think either of you might be pregnant. This medication can cause serious birth defects if taken during pregnancy and for 6 months after the last dose. You will need a negative pregnancy test before starting this medication. Contraception is recommended while  taking this medication and for 6 months after the last dose. Your care team can help you find the option that works for you. Do not father a child while taking this medication and for 3 months after the last dose. Use a condom for contraception during this time period. Do not breastfeed while taking this medication and for 7 days after the last dose. This medication may cause infertility. Talk to your care team if you are concerned about  your fertility. What side effects may I notice from receiving this medication? Side effects that you should report to your care team as soon as possible: Allergic reactions--skin rash, itching, hives, swelling of the face, lips, tongue, or throat Dry cough, shortness of breath or trouble breathing Increased saliva or tears, increased sweating, stomach cramping, diarrhea, small pupils, unusual weakness or fatigue, slow heartbeat Infection--fever, chills, cough, sore throat, wounds that don't heal, pain or trouble when passing urine, general feeling of discomfort or being unwell Kidney injury--decrease in the amount of urine, swelling of the ankles, hands, or feet Low red blood cell level--unusual weakness or fatigue, dizziness, headache, trouble breathing Severe or prolonged diarrhea Unusual bruising or bleeding Side effects that usually do not require medical attention (report to your care team if they continue or are bothersome): Constipation Diarrhea Hair loss Loss of appetite Nausea Stomach pain This list may not describe all possible side effects. Call your doctor for medical advice about side effects. You may report side effects to FDA at 1-800-FDA-1088. Where should I keep my medication? This medication is given in a hospital or clinic. It will not be stored at home. NOTE: This sheet is a summary. It may not cover all possible information. If you have questions about this medicine, talk to your doctor, pharmacist, or health care provider.  2024 Elsevier/Gold Standard (2021-10-24 00:00:00)   Leucovorin Injection What is this medication? LEUCOVORIN (loo koe VOR in) prevents side effects from certain medications, such as methotrexate. It works by increasing folate levels. This helps protect healthy cells in your body. It may also be used to treat anemia caused by low levels of folate. It can also be used with fluorouracil, a type of chemotherapy, to treat colorectal cancer. It works by  increasing the effects of fluorouracil in the body. This medicine may be used for other purposes; ask your health care provider or pharmacist if you have questions. What should I tell my care team before I take this medication? They need to know if you have any of these conditions: Anemia from low levels of vitamin B12 in the blood An unusual or allergic reaction to leucovorin, folic acid, other medications, foods, dyes, or preservatives Pregnant or trying to get pregnant Breastfeeding How should I use this medication? This medication is injected into a vein or a muscle. It is given by your care team in a hospital or clinic setting. Talk to your care team about the use of this medication in children. Special care may be needed. Overdosage: If you think you have taken too much of this medicine contact a poison control center or emergency room at once. NOTE: This medicine is only for you. Do not share this medicine with others. What if I miss a dose? Keep appointments for follow-up doses. It is important not to miss your dose. Call your care team if you are unable to keep an appointment. What may interact with this medication? Capecitabine Fluorouracil Phenobarbital Phenytoin Primidone Trimethoprim;sulfamethoxazole This list may not describe all possible interactions.  Give your health care provider a list of all the medicines, herbs, non-prescription drugs, or dietary supplements you use. Also tell them if you smoke, drink alcohol, or use illegal drugs. Some items may interact with your medicine. What should I watch for while using this medication? Your condition will be monitored carefully while you are receiving this medication. This medication may increase the side effects of 5-fluorouracil. Tell your care team if you have diarrhea or mouth sores that do not get better or that get worse. What side effects may I notice from receiving this medication? Side effects that you should report to  your care team as soon as possible: Allergic reactions--skin rash, itching, hives, swelling of the face, lips, tongue, or throat This list may not describe all possible side effects. Call your doctor for medical advice about side effects. You may report side effects to FDA at 1-800-FDA-1088. Where should I keep my medication? This medication is given in a hospital or clinic. It will not be stored at home. NOTE: This sheet is a summary. It may not cover all possible information. If you have questions about this medicine, talk to your doctor, pharmacist, or health care provider.  2024 Elsevier/Gold Standard (2021-11-15 00:00:00)   Fluorouracil Injection What is this medication? FLUOROURACIL (flure oh YOOR a sil) treats some types of cancer. It works by slowing down the growth of cancer cells. This medicine may be used for other purposes; ask your health care provider or pharmacist if you have questions. COMMON BRAND NAME(S): Adrucil What should I tell my care team before I take this medication? They need to know if you have any of these conditions: Blood disorders Dihydropyrimidine dehydrogenase (DPD) deficiency Infection, such as chickenpox, cold sores, herpes Kidney disease Liver disease Poor nutrition Recent or ongoing radiation therapy An unusual or allergic reaction to fluorouracil, other medications, foods, dyes, or preservatives If you or your partner are pregnant or trying to get pregnant Breast-feeding How should I use this medication? This medication is injected into a vein. It is administered by your care team in a hospital or clinic setting. Talk to your care team about the use of this medication in children. Special care may be needed. Overdosage: If you think you have taken too much of this medicine contact a poison control center or emergency room at once. NOTE: This medicine is only for you. Do not share this medicine with others. What if I miss a dose? Keep  appointments for follow-up doses. It is important not to miss your dose. Call your care team if you are unable to keep an appointment. What may interact with this medication? Do not take this medication with any of the following: Live virus vaccines This medication may also interact with the following: Medications that treat or prevent blood clots, such as warfarin, enoxaparin, dalteparin This list may not describe all possible interactions. Give your health care provider a list of all the medicines, herbs, non-prescription drugs, or dietary supplements you use. Also tell them if you smoke, drink alcohol, or use illegal drugs. Some items may interact with your medicine. What should I watch for while using this medication? Your condition will be monitored carefully while you are receiving this medication. This medication may make you feel generally unwell. This is not uncommon as chemotherapy can affect healthy cells as well as cancer cells. Report any side effects. Continue your course of treatment even though you feel ill unless your care team tells you to stop. In some  cases, you may be given additional medications to help with side effects. Follow all directions for their use. This medication may increase your risk of getting an infection. Call your care team for advice if you get a fever, chills, sore throat, or other symptoms of a cold or flu. Do not treat yourself. Try to avoid being around people who are sick. This medication may increase your risk to bruise or bleed. Call your care team if you notice any unusual bleeding. Be careful brushing or flossing your teeth or using a toothpick because you may get an infection or bleed more easily. If you have any dental work done, tell your dentist you are receiving this medication. Avoid taking medications that contain aspirin, acetaminophen, ibuprofen, naproxen, or ketoprofen unless instructed by your care team. These medications may hide a fever. Do  not treat diarrhea with over the counter products. Contact your care team if you have diarrhea that lasts more than 2 days or if it is severe and watery. This medication can make you more sensitive to the sun. Keep out of the sun. If you cannot avoid being in the sun, wear protective clothing and sunscreen. Do not use sun lamps, tanning beds, or tanning booths. Talk to your care team if you or your partner wish to become pregnant or think you might be pregnant. This medication can cause serious birth defects if taken during pregnancy and for 3 months after the last dose. A reliable form of contraception is recommended while taking this medication and for 3 months after the last dose. Talk to your care team about effective forms of contraception. Do not father a child while taking this medication and for 3 months after the last dose. Use a condom while having sex during this time period. Do not breastfeed while taking this medication. This medication may cause infertility. Talk to your care team if you are concerned about your fertility. What side effects may I notice from receiving this medication? Side effects that you should report to your care team as soon as possible: Allergic reactions--skin rash, itching, hives, swelling of the face, lips, tongue, or throat Heart attack--pain or tightness in the chest, shoulders, arms, or jaw, nausea, shortness of breath, cold or clammy skin, feeling faint or lightheaded Heart failure--shortness of breath, swelling of the ankles, feet, or hands, sudden weight gain, unusual weakness or fatigue Heart rhythm changes--fast or irregular heartbeat, dizziness, feeling faint or lightheaded, chest pain, trouble breathing High ammonia level--unusual weakness or fatigue, confusion, loss of appetite, nausea, vomiting, seizures Infection--fever, chills, cough, sore throat, wounds that don't heal, pain or trouble when passing urine, general feeling of discomfort or being  unwell Low red blood cell level--unusual weakness or fatigue, dizziness, headache, trouble breathing Pain, tingling, or numbness in the hands or feet, muscle weakness, change in vision, confusion or trouble speaking, loss of balance or coordination, trouble walking, seizures Redness, swelling, and blistering of the skin over hands and feet Severe or prolonged diarrhea Unusual bruising or bleeding Side effects that usually do not require medical attention (report to your care team if they continue or are bothersome): Dry skin Headache Increased tears Nausea Pain, redness, or swelling with sores inside the mouth or throat Sensitivity to light Vomiting This list may not describe all possible side effects. Call your doctor for medical advice about side effects. You may report side effects to FDA at 1-800-FDA-1088. Where should I keep my medication? This medication is given in a hospital or clinic. It  will not be stored at home. NOTE: This sheet is a summary. It may not cover all possible information. If you have questions about this medicine, talk to your doctor, pharmacist, or health care provider.  2024 Elsevier/Gold Standard (2021-10-18 00:00:00)   The chemotherapy medication bag should finish at 46 hours, 96 hours, or 7 days. For example, if your pump is scheduled for 46 hours and it was put on at 4:00 p.m., it should finish at 2:00 p.m. the day it is scheduled to come off regardless of your appointment time.     Estimated time to finish at 11:30 a.m. on Wednesday 03/21/2023.   If the display on your pump reads "Low Volume" and it is beeping, take the batteries out of the pump and come to the cancer center for it to be taken off.   If the pump alarms go off prior to the pump reading "Low Volume" then call [phone] and someone can assist you.  If the plunger comes out and the chemotherapy medication is leaking out, please use your home chemo spill kit to clean up the spill. Do NOT  use paper towels or other household products.  If you have problems or questions regarding your pump, please call either [phone] (24 hours a day) or the cancer center Monday-Friday 8:00 a.m.- 4:30 p.m. at the clinic number and we will assist you. If you are unable to get assistance, then go to the nearest Emergency Department and ask the staff to contact the IV team for assistance.

## 2023-03-20 ENCOUNTER — Telehealth: Payer: Self-pay

## 2023-03-20 NOTE — Telephone Encounter (Signed)
24 Hour Call Back  Telephone call to patient post his first time Irinotecan. Patient denies any concerns at this time. He reports feeling a little sick in the stomach which is not new with him. He knows to call the clinic with any concerns or questions. Patient will be back tomorrow for his pump stop appointment.

## 2023-03-21 ENCOUNTER — Inpatient Hospital Stay: Payer: Commercial Managed Care - PPO

## 2023-03-21 VITALS — BP 126/84 | HR 94 | Temp 98.0°F | Resp 20

## 2023-03-21 DIAGNOSIS — Z5111 Encounter for antineoplastic chemotherapy: Secondary | ICD-10-CM | POA: Diagnosis not present

## 2023-03-21 DIAGNOSIS — C20 Malignant neoplasm of rectum: Secondary | ICD-10-CM

## 2023-03-21 MED ORDER — HEPARIN SOD (PORK) LOCK FLUSH 100 UNIT/ML IV SOLN
500.0000 [IU] | Freq: Once | INTRAVENOUS | Status: AC | PRN
  Administered 2023-03-21: 500 [IU]

## 2023-03-21 MED ORDER — SODIUM CHLORIDE 0.9% FLUSH
10.0000 mL | INTRAVENOUS | Status: DC | PRN
  Administered 2023-03-21: 10 mL

## 2023-03-21 NOTE — Patient Instructions (Signed)

## 2023-03-22 ENCOUNTER — Other Ambulatory Visit (HOSPITAL_COMMUNITY): Payer: Self-pay

## 2023-04-01 ENCOUNTER — Other Ambulatory Visit: Payer: Self-pay | Admitting: Oncology

## 2023-04-02 ENCOUNTER — Inpatient Hospital Stay: Payer: Commercial Managed Care - PPO

## 2023-04-02 ENCOUNTER — Inpatient Hospital Stay: Payer: Commercial Managed Care - PPO | Admitting: Nurse Practitioner

## 2023-04-02 ENCOUNTER — Telehealth: Payer: Self-pay

## 2023-04-02 ENCOUNTER — Encounter: Payer: Self-pay | Admitting: Nurse Practitioner

## 2023-04-02 ENCOUNTER — Inpatient Hospital Stay: Payer: Commercial Managed Care - PPO | Attending: Oncology

## 2023-04-02 VITALS — BP 128/80 | HR 85 | Temp 98.2°F | Resp 18 | Ht 72.0 in | Wt 252.2 lb

## 2023-04-02 VITALS — BP 126/84 | HR 88 | Temp 97.5°F | Resp 18

## 2023-04-02 DIAGNOSIS — C787 Secondary malignant neoplasm of liver and intrahepatic bile duct: Secondary | ICD-10-CM | POA: Diagnosis not present

## 2023-04-02 DIAGNOSIS — C19 Malignant neoplasm of rectosigmoid junction: Secondary | ICD-10-CM | POA: Insufficient documentation

## 2023-04-02 DIAGNOSIS — Z5111 Encounter for antineoplastic chemotherapy: Secondary | ICD-10-CM | POA: Diagnosis present

## 2023-04-02 DIAGNOSIS — C20 Malignant neoplasm of rectum: Secondary | ICD-10-CM

## 2023-04-02 LAB — CBC WITH DIFFERENTIAL (CANCER CENTER ONLY)
Abs Immature Granulocytes: 0.01 10*3/uL (ref 0.00–0.07)
Basophils Absolute: 0.1 10*3/uL (ref 0.0–0.1)
Basophils Relative: 2 %
Eosinophils Absolute: 0.3 10*3/uL (ref 0.0–0.5)
Eosinophils Relative: 8 %
HCT: 40 % (ref 39.0–52.0)
Hemoglobin: 13.1 g/dL (ref 13.0–17.0)
Immature Granulocytes: 0 %
Lymphocytes Relative: 12 %
Lymphs Abs: 0.5 10*3/uL — ABNORMAL LOW (ref 0.7–4.0)
MCH: 27.3 pg (ref 26.0–34.0)
MCHC: 32.8 g/dL (ref 30.0–36.0)
MCV: 83.5 fL (ref 80.0–100.0)
Monocytes Absolute: 0.3 10*3/uL (ref 0.1–1.0)
Monocytes Relative: 9 %
Neutro Abs: 2.8 10*3/uL (ref 1.7–7.7)
Neutrophils Relative %: 69 %
Platelet Count: 169 10*3/uL (ref 150–400)
RBC: 4.79 MIL/uL (ref 4.22–5.81)
RDW: 14.9 % (ref 11.5–15.5)
WBC Count: 4 10*3/uL (ref 4.0–10.5)
nRBC: 0 % (ref 0.0–0.2)

## 2023-04-02 LAB — CMP (CANCER CENTER ONLY)
ALT: 19 U/L (ref 0–44)
AST: 14 U/L — ABNORMAL LOW (ref 15–41)
Albumin: 3.9 g/dL (ref 3.5–5.0)
Alkaline Phosphatase: 75 U/L (ref 38–126)
Anion gap: 8 (ref 5–15)
BUN: 17 mg/dL (ref 8–23)
CO2: 23 mmol/L (ref 22–32)
Calcium: 8.8 mg/dL — ABNORMAL LOW (ref 8.9–10.3)
Chloride: 106 mmol/L (ref 98–111)
Creatinine: 1.05 mg/dL (ref 0.61–1.24)
GFR, Estimated: >60 mL/min (ref >60)
Glucose, Bld: 224 mg/dL — ABNORMAL HIGH (ref 70–99)
Potassium: 4.3 mmol/L (ref 3.5–5.1)
Sodium: 137 mmol/L (ref 135–145)
Total Bilirubin: 0.4 mg/dL (ref 0.3–1.2)
Total Protein: 6.5 g/dL (ref 6.5–8.1)

## 2023-04-02 MED ORDER — SODIUM CHLORIDE 0.9 % IV SOLN
300.0000 mg/m2 | Freq: Once | INTRAVENOUS | Status: AC
  Administered 2023-04-02: 724 mg via INTRAVENOUS
  Filled 2023-04-02: qty 36.2

## 2023-04-02 MED ORDER — ATROPINE SULFATE 1 MG/ML IV SOLN
0.5000 mg | Freq: Once | INTRAVENOUS | Status: AC | PRN
  Administered 2023-04-02: 0.5 mg via INTRAVENOUS
  Filled 2023-04-02: qty 1

## 2023-04-02 MED ORDER — SODIUM CHLORIDE 0.9 % IV SOLN
150.0000 mg | Freq: Once | INTRAVENOUS | Status: AC
  Administered 2023-04-02: 150 mg via INTRAVENOUS
  Filled 2023-04-02: qty 150

## 2023-04-02 MED ORDER — SODIUM CHLORIDE 0.9 % IV SOLN
10.0000 mg | Freq: Once | INTRAVENOUS | Status: AC
  Administered 2023-04-02: 10 mg via INTRAVENOUS
  Filled 2023-04-02: qty 10

## 2023-04-02 MED ORDER — LORAZEPAM 0.5 MG PO TABS: 0.5000 mg | ORAL_TABLET | Freq: Three times a day (TID) | ORAL | 0 refills | Status: DC | PRN

## 2023-04-02 MED ORDER — PROCHLORPERAZINE MALEATE 10 MG PO TABS
10.0000 mg | ORAL_TABLET | Freq: Four times a day (QID) | ORAL | 3 refills | Status: DC | PRN
Start: 2023-04-02 — End: 2024-01-02

## 2023-04-02 MED ORDER — PALONOSETRON HCL INJECTION 0.25 MG/5ML
0.2500 mg | Freq: Once | INTRAVENOUS | Status: AC
  Administered 2023-04-02: 0.25 mg via INTRAVENOUS
  Filled 2023-04-02: qty 5

## 2023-04-02 MED ORDER — SODIUM CHLORIDE 0.9 % IV SOLN: Freq: Once | INTRAVENOUS | Status: AC

## 2023-04-02 MED ORDER — SODIUM CHLORIDE 0.9 % IV SOLN
1440.0000 mg/m2 | INTRAVENOUS | Status: DC
  Administered 2023-04-02: 3500 mg via INTRAVENOUS
  Filled 2023-04-02: qty 70

## 2023-04-02 MED ORDER — SODIUM CHLORIDE 0.9 % IV SOLN
125.0000 mg/m2 | Freq: Once | INTRAVENOUS | Status: AC
  Administered 2023-04-02: 300 mg via INTRAVENOUS
  Filled 2023-04-02: qty 15

## 2023-04-02 MED ORDER — ONDANSETRON 4 MG PO TBDP: 4.0000 mg | ORAL_TABLET | Freq: Three times a day (TID) | ORAL | 3 refills | Status: DC | PRN
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
## 2023-04-02 NOTE — Patient Instructions (Signed)
[facility]  Discharge Instructions: Thank you for choosing [facility] to provide your oncology and hematology care.   If you have a lab appointment with the Cancer Center, please go directly to the Cancer Center and check in at the registration area.   Wear comfortable clothing and clothing appropriate for easy access to any Portacath or PICC line.   We strive to give you quality time with your provider. You may need to reschedule your appointment if you arrive late (15 or more minutes).  Arriving late affects you and other patients whose appointments are after yours.  Also, if you miss three or more appointments without notifying the office, you may be dismissed from the clinic at the provider's discretion.      For prescription refill requests, have your pharmacy contact our office and allow 72 hours for refills to be completed.    Today you received the following chemotherapy and/or immunotherapy agents: Irinotecan, Leucovorin and Adrucil       To help prevent nausea and vomiting after your treatment, we encourage you to take your nausea medication as directed.  BELOW ARE SYMPTOMS THAT SHOULD BE REPORTED IMMEDIATELY: *FEVER GREATER THAN 100.4 F (38 C) OR HIGHER *CHILLS OR SWEATING *NAUSEA AND VOMITING THAT IS NOT CONTROLLED WITH YOUR NAUSEA MEDICATION *UNUSUAL SHORTNESS OF BREATH *UNUSUAL BRUISING OR BLEEDING *URINARY PROBLEMS (pain or burning when urinating, or frequent urination) *BOWEL PROBLEMS (unusual diarrhea, constipation, pain near the anus) TENDERNESS IN MOUTH AND THROAT WITH OR WITHOUT PRESENCE OF ULCERS (sore throat, sores in mouth, or a toothache) UNUSUAL RASH, SWELLING OR PAIN  UNUSUAL VAGINAL DISCHARGE OR ITCHING   Items with * indicate a potential emergency and should be followed up as soon as possible or go to the Emergency Department if any problems should occur.  Please show the CHEMOTHERAPY ALERT CARD or  IMMUNOTHERAPY ALERT CARD at check-in to the Emergency Department and triage nurse.  Should you have questions after your visit or need to cancel or reschedule your appointment, please contact [facility]  Dept: [phone]  and follow the prompts.  Office hours are 8:00 a.m. to 4:30 p.m. Monday - Friday. Please note that voicemails left after 4:00 p.m. may not be returned until the following business day.  We are closed weekends and major holidays. You have access to a nurse at all times for urgent questions. Please call the main number to the clinic Dept: [phone] and follow the prompts.   For any non-urgent questions, you may also contact your provider using MyChart. We now offer e-Visits for anyone 85 and older to request care online for non-urgent symptoms. For details visit [email or web address].   Also download the MyChart app! Go to the app store, search "MyChart", open the app, select [facility], and log in with your MyChart username and password.

## 2023-04-02 NOTE — Telephone Encounter (Signed)
Magic mouth wash was order.

## 2023-04-02 NOTE — Progress Notes (Signed)
Patient seen by Lonna Cobb NP today  Vitals are within treatment parameters:Yes   Labs are within treatment parameters: Yes   Treatment plan has been signed: Yes   Per physician team, Patient is ready for treatment. Please note the following modifications: Emend was added to his pre-medication.

## 2023-04-04 ENCOUNTER — Inpatient Hospital Stay: Payer: Commercial Managed Care - PPO

## 2023-04-04 VITALS — BP 126/77 | HR 88 | Temp 97.7°F | Resp 18

## 2023-04-04 DIAGNOSIS — C20 Malignant neoplasm of rectum: Secondary | ICD-10-CM

## 2023-04-04 DIAGNOSIS — Z5111 Encounter for antineoplastic chemotherapy: Secondary | ICD-10-CM | POA: Diagnosis not present

## 2023-04-04 MED ORDER — HEPARIN SOD (PORK) LOCK FLUSH 100 UNIT/ML IV SOLN
500.0000 [IU] | Freq: Once | INTRAVENOUS | Status: AC | PRN
  Administered 2023-04-04: 500 [IU]

## 2023-04-04 MED ORDER — SODIUM CHLORIDE 0.9% FLUSH
10.0000 mL | INTRAVENOUS | Status: DC | PRN
  Administered 2023-04-04: 10 mL

## 2023-04-04 NOTE — Patient Instructions (Signed)

## 2023-04-05 ENCOUNTER — Telehealth: Payer: Self-pay

## 2023-04-05 ENCOUNTER — Encounter: Payer: Self-pay | Admitting: Oncology

## 2023-04-05 ENCOUNTER — Other Ambulatory Visit (HOSPITAL_COMMUNITY): Payer: Self-pay

## 2023-04-05 NOTE — Telephone Encounter (Signed)
Pharmacy Patient Advocate Encounter   Received notification from CoverMyMeds that prior authorization for [facility] is required/requested.   Insurance verification completed.   The patient is insured through  [employer]  .   Per test claim: PA required; PA submitted to [facility] via Prompt PA Key/confirmation #/[id number] Status is pending

## 2023-04-13 ENCOUNTER — Other Ambulatory Visit (HOSPITAL_COMMUNITY): Payer: Self-pay

## 2023-04-15 ENCOUNTER — Other Ambulatory Visit: Payer: Self-pay | Admitting: Oncology

## 2023-04-15 DIAGNOSIS — C20 Malignant neoplasm of rectum: Secondary | ICD-10-CM

## 2023-04-16 ENCOUNTER — Inpatient Hospital Stay: Payer: Commercial Managed Care - PPO | Admitting: Oncology

## 2023-04-16 ENCOUNTER — Inpatient Hospital Stay: Payer: Commercial Managed Care - PPO

## 2023-04-16 ENCOUNTER — Encounter: Payer: Self-pay | Admitting: Oncology

## 2023-04-16 VITALS — BP 122/76 | HR 81

## 2023-04-16 VITALS — BP 103/77 | HR 89 | Temp 97.6°F | Resp 18 | Wt 251.8 lb

## 2023-04-16 DIAGNOSIS — C20 Malignant neoplasm of rectum: Secondary | ICD-10-CM

## 2023-04-16 DIAGNOSIS — Z5111 Encounter for antineoplastic chemotherapy: Secondary | ICD-10-CM | POA: Diagnosis not present

## 2023-04-16 LAB — CMP (CANCER CENTER ONLY)
ALT: 25 U/L (ref 0–44)
AST: 17 U/L (ref 15–41)
Albumin: 4.5 g/dL (ref 3.5–5.0)
Alkaline Phosphatase: 91 U/L (ref 38–126)
Anion gap: 8 (ref 5–15)
BUN: 18 mg/dL (ref 8–23)
CO2: 23 mmol/L (ref 22–32)
Calcium: 9.4 mg/dL (ref 8.9–10.3)
Chloride: 104 mmol/L (ref 98–111)
Creatinine: 0.95 mg/dL (ref 0.61–1.24)
GFR, Estimated: >60 mL/min (ref >60)
Glucose, Bld: 184 mg/dL — ABNORMAL HIGH (ref 70–99)
Potassium: 4.1 mmol/L (ref 3.5–5.1)
Sodium: 135 mmol/L (ref 135–145)
Total Bilirubin: 0.4 mg/dL (ref 0.3–1.2)
Total Protein: 7.3 g/dL (ref 6.5–8.1)

## 2023-04-16 LAB — CBC WITH DIFFERENTIAL (CANCER CENTER ONLY)
Abs Immature Granulocytes: 0.02 10*3/uL (ref 0.00–0.07)
Basophils Absolute: 0.1 10*3/uL (ref 0.0–0.1)
Basophils Relative: 2 %
Eosinophils Absolute: 0.3 10*3/uL (ref 0.0–0.5)
Eosinophils Relative: 6 %
HCT: 43.3 % (ref 39.0–52.0)
Hemoglobin: 14.3 g/dL (ref 13.0–17.0)
Immature Granulocytes: 0 %
Lymphocytes Relative: 13 %
Lymphs Abs: 0.6 10*3/uL — ABNORMAL LOW (ref 0.7–4.0)
MCH: 27.2 pg (ref 26.0–34.0)
MCHC: 33 g/dL (ref 30.0–36.0)
MCV: 82.3 fL (ref 80.0–100.0)
Monocytes Absolute: 0.4 10*3/uL (ref 0.1–1.0)
Monocytes Relative: 9 %
Neutro Abs: 3.3 10*3/uL (ref 1.7–7.7)
Neutrophils Relative %: 70 %
Platelet Count: 184 10*3/uL (ref 150–400)
RBC: 5.26 MIL/uL (ref 4.22–5.81)
RDW: 15.5 % (ref 11.5–15.5)
WBC Count: 4.7 10*3/uL (ref 4.0–10.5)
nRBC: 0 % (ref 0.0–0.2)

## 2023-04-16 MED ORDER — ONDANSETRON HCL 8 MG PO TABS: 8.0000 mg | ORAL_TABLET | Freq: Three times a day (TID) | ORAL | 3 refills | Status: DC | PRN

## 2023-04-16 MED ORDER — SODIUM CHLORIDE 0.9 % IV SOLN
10.0000 mg | Freq: Once | INTRAVENOUS | Status: DC
Start: 2023-04-16 — End: 2023-04-16

## 2023-04-16 MED ORDER — ATROPINE SULFATE 1 MG/ML IV SOLN
0.5000 mg | Freq: Once | INTRAVENOUS | Status: AC | PRN
  Administered 2023-04-16: 0.5 mg via INTRAVENOUS
  Filled 2023-04-16: qty 1

## 2023-04-16 MED ORDER — PALONOSETRON HCL INJECTION 0.25 MG/5ML
0.2500 mg | Freq: Once | INTRAVENOUS | Status: AC
  Administered 2023-04-16: 0.25 mg via INTRAVENOUS
  Filled 2023-04-16: qty 5

## 2023-04-16 MED ORDER — SODIUM CHLORIDE 0.9 % IV SOLN: Freq: Once | INTRAVENOUS | Status: AC

## 2023-04-16 MED ORDER — IRINOTECAN HCL CHEMO INJECTION 100 MG/5ML
125.0000 mg/m2 | Freq: Once | INTRAVENOUS | Status: AC
  Administered 2023-04-16: 300 mg via INTRAVENOUS
  Filled 2023-04-16: qty 15

## 2023-04-16 MED ORDER — SODIUM CHLORIDE 0.9 % IV SOLN
1440.0000 mg/m2 | INTRAVENOUS | Status: DC
  Administered 2023-04-16: 3500 mg via INTRAVENOUS
  Filled 2023-04-16: qty 20

## 2023-04-16 MED ORDER — SODIUM CHLORIDE 0.9 % IV SOLN
150.0000 mg | Freq: Once | INTRAVENOUS | Status: AC
  Administered 2023-04-16: 150 mg via INTRAVENOUS
  Filled 2023-04-16: qty 150

## 2023-04-16 MED ORDER — OXYCODONE-ACETAMINOPHEN 5-325 MG PO TABS: 1.0000 | ORAL_TABLET | ORAL | 0 refills | Status: DC | PRN

## 2023-04-16 MED ORDER — SODIUM CHLORIDE 0.9 % IV SOLN
300.0000 mg/m2 | Freq: Once | INTRAVENOUS | Status: AC
  Administered 2023-04-16: 724 mg via INTRAVENOUS
  Filled 2023-04-16: qty 36.2

## 2023-04-16 MED ORDER — SODIUM CHLORIDE 0.9% FLUSH
10.0000 mL | INTRAVENOUS | Status: DC | PRN
  Administered 2023-04-16: 10 mL

## 2023-04-16 MED ORDER — DEXAMETHASONE SODIUM PHOSPHATE 10 MG/ML IJ SOLN
10.0000 mg | Freq: Once | INTRAMUSCULAR | Status: AC
  Administered 2023-04-16: 10 mg via INTRAVENOUS
  Filled 2023-04-16: qty 1

## 2023-04-16 NOTE — Patient Instructions (Signed)
[facility]  Discharge Instructions: Thank you for choosing [facility] to provide your oncology and hematology care.   If you have a lab appointment with the Cancer Center, please go directly to the Cancer Center and check in at the registration area.   Wear comfortable clothing and clothing appropriate for easy access to any Portacath or PICC line.   We strive to give you quality time with your provider. You may need to reschedule your appointment if you arrive late (15 or more minutes).  Arriving late affects you and other patients whose appointments are after yours.  Also, if you miss three or more appointments without notifying the office, you may be dismissed from the clinic at the provider's discretion.      For prescription refill requests, have your pharmacy contact our office and allow 72 hours for refills to be completed.    Today you received the following chemotherapy and/or immunotherapy agents: Irinotecan, Leucovorin and Adrucil       To help prevent nausea and vomiting after your treatment, we encourage you to take your nausea medication as directed.  BELOW ARE SYMPTOMS THAT SHOULD BE REPORTED IMMEDIATELY: *FEVER GREATER THAN 100.4 F (38 C) OR HIGHER *CHILLS OR SWEATING *NAUSEA AND VOMITING THAT IS NOT CONTROLLED WITH YOUR NAUSEA MEDICATION *UNUSUAL SHORTNESS OF BREATH *UNUSUAL BRUISING OR BLEEDING *URINARY PROBLEMS (pain or burning when urinating, or frequent urination) *BOWEL PROBLEMS (unusual diarrhea, constipation, pain near the anus) TENDERNESS IN MOUTH AND THROAT WITH OR WITHOUT PRESENCE OF ULCERS (sore throat, sores in mouth, or a toothache) UNUSUAL RASH, SWELLING OR PAIN  UNUSUAL VAGINAL DISCHARGE OR ITCHING   Items with * indicate a potential emergency and should be followed up as soon as possible or go to the Emergency Department if any problems should occur.  Please show the CHEMOTHERAPY ALERT CARD or  IMMUNOTHERAPY ALERT CARD at check-in to the Emergency Department and triage nurse.  Should you have questions after your visit or need to cancel or reschedule your appointment, please contact [facility]  Dept: [phone]  and follow the prompts.  Office hours are 8:00 a.m. to 4:30 p.m. Monday - Friday. Please note that voicemails left after 4:00 p.m. may not be returned until the following business day.  We are closed weekends and major holidays. You have access to a nurse at all times for urgent questions. Please call the main number to the clinic Dept: [phone] and follow the prompts.   For any non-urgent questions, you may also contact your provider using MyChart. We now offer e-Visits for anyone 85 and older to request care online for non-urgent symptoms. For details visit [email or web address].   Also download the MyChart app! Go to the app store, search "MyChart", open the app, select [facility], and log in with your MyChart username and password.

## 2023-04-16 NOTE — Patient Instructions (Signed)

## 2023-04-16 NOTE — Progress Notes (Unsigned)
Patient seen by Dr. Thornton Papas today  Vitals are within treatment parameters:Yes   Labs are within treatment parameters: Yes   Treatment plan has been signed: Yes   Per physician team, Patient is ready for treatment and there are NO modifications to the treatment plan.
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
## 2023-04-18 ENCOUNTER — Other Ambulatory Visit: Payer: Self-pay | Admitting: *Deleted

## 2023-04-18 ENCOUNTER — Other Ambulatory Visit: Payer: Self-pay | Admitting: Nurse Practitioner

## 2023-04-18 ENCOUNTER — Encounter: Payer: Self-pay | Admitting: Oncology

## 2023-04-18 ENCOUNTER — Inpatient Hospital Stay: Payer: Commercial Managed Care - PPO

## 2023-04-18 VITALS — BP 127/84 | HR 91 | Temp 97.4°F | Resp 18

## 2023-04-18 DIAGNOSIS — C20 Malignant neoplasm of rectum: Secondary | ICD-10-CM

## 2023-04-18 DIAGNOSIS — Z5111 Encounter for antineoplastic chemotherapy: Secondary | ICD-10-CM | POA: Diagnosis not present

## 2023-04-18 DIAGNOSIS — J301 Allergic rhinitis due to pollen: Secondary | ICD-10-CM

## 2023-04-18 MED ORDER — OXYCODONE-ACETAMINOPHEN 5-325 MG PO TABS: 1.0000 | ORAL_TABLET | ORAL | 0 refills | Status: DC | PRN

## 2023-04-18 MED ORDER — HEPARIN SOD (PORK) LOCK FLUSH 100 UNIT/ML IV SOLN
500.0000 [IU] | Freq: Once | INTRAVENOUS | Status: AC | PRN
  Administered 2023-04-18: 500 [IU]

## 2023-04-18 MED ORDER — SODIUM CHLORIDE 0.9% FLUSH
10.0000 mL | INTRAVENOUS | Status: DC | PRN
  Administered 2023-04-18: 10 mL

## 2023-04-18 MED ORDER — MONTELUKAST SODIUM 10 MG PO TABS: 10.0000 mg | ORAL_TABLET | Freq: Every day | ORAL | 1 refills | Status: DC

## 2023-04-18 NOTE — Patient Instructions (Signed)

## 2023-04-25 ENCOUNTER — Encounter: Payer: Self-pay | Admitting: Nurse Practitioner

## 2023-04-26 NOTE — Telephone Encounter (Signed)
Forwarded to scheduler

## 2023-04-30 ENCOUNTER — Inpatient Hospital Stay: Payer: Commercial Managed Care - PPO

## 2023-04-30 ENCOUNTER — Inpatient Hospital Stay: Payer: Commercial Managed Care - PPO | Admitting: Nurse Practitioner

## 2023-05-02 ENCOUNTER — Inpatient Hospital Stay: Payer: Commercial Managed Care - PPO

## 2023-05-07 ENCOUNTER — Inpatient Hospital Stay: Payer: Commercial Managed Care - PPO | Attending: Oncology

## 2023-05-07 ENCOUNTER — Encounter: Payer: Self-pay | Admitting: *Deleted

## 2023-05-07 ENCOUNTER — Inpatient Hospital Stay: Payer: Commercial Managed Care - PPO

## 2023-05-07 ENCOUNTER — Inpatient Hospital Stay: Payer: Commercial Managed Care - PPO | Admitting: Oncology

## 2023-05-07 VITALS — BP 122/80 | HR 96 | Temp 98.1°F | Resp 18 | Ht 72.0 in | Wt 252.1 lb

## 2023-05-07 DIAGNOSIS — Z23 Encounter for immunization: Secondary | ICD-10-CM | POA: Insufficient documentation

## 2023-05-07 DIAGNOSIS — R110 Nausea: Secondary | ICD-10-CM | POA: Diagnosis not present

## 2023-05-07 DIAGNOSIS — C19 Malignant neoplasm of rectosigmoid junction: Secondary | ICD-10-CM | POA: Diagnosis not present

## 2023-05-07 DIAGNOSIS — C20 Malignant neoplasm of rectum: Secondary | ICD-10-CM

## 2023-05-07 DIAGNOSIS — Z803 Family history of malignant neoplasm of breast: Secondary | ICD-10-CM | POA: Insufficient documentation

## 2023-05-07 DIAGNOSIS — Z5111 Encounter for antineoplastic chemotherapy: Secondary | ICD-10-CM | POA: Diagnosis present

## 2023-05-07 DIAGNOSIS — C787 Secondary malignant neoplasm of liver and intrahepatic bile duct: Secondary | ICD-10-CM | POA: Diagnosis not present

## 2023-05-07 DIAGNOSIS — R519 Headache, unspecified: Secondary | ICD-10-CM | POA: Insufficient documentation

## 2023-05-07 DIAGNOSIS — Z800 Family history of malignant neoplasm of digestive organs: Secondary | ICD-10-CM | POA: Diagnosis not present

## 2023-05-07 LAB — CBC WITH DIFFERENTIAL (CANCER CENTER ONLY)
Abs Immature Granulocytes: 0.02 10*3/uL (ref 0.00–0.07)
Basophils Absolute: 0.1 10*3/uL (ref 0.0–0.1)
Basophils Relative: 3 %
Eosinophils Absolute: 0.1 10*3/uL (ref 0.0–0.5)
Eosinophils Relative: 3 %
HCT: 44.4 % (ref 39.0–52.0)
Hemoglobin: 14.5 g/dL (ref 13.0–17.0)
Immature Granulocytes: 1 %
Lymphocytes Relative: 12 %
Lymphs Abs: 0.5 10*3/uL — ABNORMAL LOW (ref 0.7–4.0)
MCH: 27.2 pg (ref 26.0–34.0)
MCHC: 32.7 g/dL (ref 30.0–36.0)
MCV: 83.1 fL (ref 80.0–100.0)
Monocytes Absolute: 0.3 10*3/uL (ref 0.1–1.0)
Monocytes Relative: 8 %
Neutro Abs: 3 10*3/uL (ref 1.7–7.7)
Neutrophils Relative %: 73 %
Platelet Count: 201 10*3/uL (ref 150–400)
RBC: 5.34 MIL/uL (ref 4.22–5.81)
RDW: 16.4 % — ABNORMAL HIGH (ref 11.5–15.5)
WBC Count: 4.1 10*3/uL (ref 4.0–10.5)
nRBC: 0 % (ref 0.0–0.2)

## 2023-05-07 LAB — CMP (CANCER CENTER ONLY)
ALT: 22 U/L (ref 0–44)
AST: 16 U/L (ref 15–41)
Albumin: 4.5 g/dL (ref 3.5–5.0)
Alkaline Phosphatase: 102 U/L (ref 38–126)
Anion gap: 10 (ref 5–15)
BUN: 18 mg/dL (ref 8–23)
CO2: 21 mmol/L — ABNORMAL LOW (ref 22–32)
Calcium: 9 mg/dL (ref 8.9–10.3)
Chloride: 104 mmol/L (ref 98–111)
Creatinine: 0.9 mg/dL (ref 0.61–1.24)
GFR, Estimated: >60 mL/min (ref >60)
Glucose, Bld: 283 mg/dL — ABNORMAL HIGH (ref 70–99)
Potassium: 4.1 mmol/L (ref 3.5–5.1)
Sodium: 135 mmol/L (ref 135–145)
Total Bilirubin: 0.5 mg/dL (ref <1.2)
Total Protein: 7.4 g/dL (ref 6.5–8.1)

## 2023-05-07 MED ORDER — DEXAMETHASONE 4 MG PO TABS: 4.0000 mg | ORAL_TABLET | Freq: Two times a day (BID) | ORAL | 1 refills | Status: DC

## 2023-05-07 MED ORDER — SODIUM CHLORIDE 0.9 % IV SOLN: Freq: Once | INTRAVENOUS | Status: AC

## 2023-05-07 MED ORDER — LEUCOVORIN CALCIUM INJECTION 350 MG
300.0000 mg/m2 | Freq: Once | INTRAMUSCULAR | Status: AC
  Administered 2023-05-07: 724 mg via INTRAVENOUS
  Filled 2023-05-07: qty 36.2

## 2023-05-07 MED ORDER — SODIUM CHLORIDE 0.9 % IV SOLN
125.0000 mg/m2 | Freq: Once | INTRAVENOUS | Status: AC
  Administered 2023-05-07: 300 mg via INTRAVENOUS
  Filled 2023-05-07: qty 15

## 2023-05-07 MED ORDER — SODIUM CHLORIDE 0.9 % IV SOLN
150.0000 mg | Freq: Once | INTRAVENOUS | Status: AC
  Administered 2023-05-07: 150 mg via INTRAVENOUS
  Filled 2023-05-07: qty 150

## 2023-05-07 MED ORDER — INFLUENZA VIRUS VACC SPLIT PF (FLUZONE) 0.5 ML IM SUSY
0.5000 mL | PREFILLED_SYRINGE | Freq: Once | INTRAMUSCULAR | Status: AC
  Administered 2023-05-07: 0.5 mL via INTRAMUSCULAR
  Filled 2023-05-07: qty 0.5

## 2023-05-07 MED ORDER — SODIUM CHLORIDE 0.9 % IV SOLN
1440.0000 mg/m2 | INTRAVENOUS | Status: DC
  Administered 2023-05-07: 3500 mg via INTRAVENOUS
  Filled 2023-05-07: qty 20

## 2023-05-07 MED ORDER — DEXAMETHASONE SODIUM PHOSPHATE 10 MG/ML IJ SOLN
10.0000 mg | Freq: Once | INTRAMUSCULAR | Status: AC
  Administered 2023-05-07: 10 mg via INTRAVENOUS
  Filled 2023-05-07: qty 1

## 2023-05-07 MED ORDER — PALONOSETRON HCL INJECTION 0.25 MG/5ML
0.2500 mg | Freq: Once | INTRAVENOUS | Status: AC
  Administered 2023-05-07: 0.25 mg via INTRAVENOUS
  Filled 2023-05-07: qty 5

## 2023-05-07 MED ORDER — ATROPINE SULFATE 1 MG/ML IV SOLN
0.5000 mg | Freq: Once | INTRAVENOUS | Status: AC | PRN
  Administered 2023-05-07: 0.5 mg via INTRAVENOUS
  Filled 2023-05-07: qty 1

## 2023-05-07 NOTE — Patient Instructions (Signed)
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
## 2023-05-07 NOTE — Patient Instructions (Signed)
[facility] - A DEPT OF [facility]  Discharge Instructions: Thank you for choosing [facility] to provide your oncology and hematology care.   If you have a lab appointment with the Cancer Center, please go directly to the Cancer Center and check in at the registration area.   Wear comfortable clothing and clothing appropriate for easy access to any Portacath or PICC line.   We strive to give you quality time with your provider. You may need to reschedule your appointment if you arrive late (15 or more minutes).  Arriving late affects you and other patients whose appointments are after yours.  Also, if you miss three or more appointments without notifying the office, you may be dismissed from the clinic at the provider's discretion.      For prescription refill requests, have your pharmacy contact our office and allow 72 hours for refills to be completed.    Today you received the following chemotherapy and/or immunotherapy agents: Irinotecan, Leucovorin and Adrucil       To help prevent nausea and vomiting after your treatment, we encourage you to take your nausea medication as directed.  BELOW ARE SYMPTOMS THAT SHOULD BE REPORTED IMMEDIATELY: *FEVER GREATER THAN 100.4 F (38 C) OR HIGHER *CHILLS OR SWEATING *NAUSEA AND VOMITING THAT IS NOT CONTROLLED WITH YOUR NAUSEA MEDICATION *UNUSUAL SHORTNESS OF BREATH *UNUSUAL BRUISING OR BLEEDING *URINARY PROBLEMS (pain or burning when urinating, or frequent urination) *BOWEL PROBLEMS (unusual diarrhea, constipation, pain near the anus) TENDERNESS IN MOUTH AND THROAT WITH OR WITHOUT PRESENCE OF ULCERS (sore throat, sores in mouth, or a toothache) UNUSUAL RASH, SWELLING OR PAIN  UNUSUAL VAGINAL DISCHARGE OR ITCHING   Items with * indicate a potential emergency and should be followed up as soon as possible or go to the Emergency Department if any problems should occur.  Please show the CHEMOTHERAPY  ALERT CARD or IMMUNOTHERAPY ALERT CARD at check-in to the Emergency Department and triage nurse.  Should you have questions after your visit or need to cancel or reschedule your appointment, please contact [facility] - A DEPT OF [facility]  Dept: [phone]  and follow the prompts.  Office hours are 8:00 a.m. to 4:30 p.m. Monday - Friday. Please note that voicemails left after 4:00 p.m. may not be returned until the following business day.  We are closed weekends and major holidays. You have access to a nurse at all times for urgent questions. Please call the main number to the clinic Dept: [phone] and follow the prompts.   For any non-urgent questions, you may also contact your provider using MyChart. We now offer e-Visits for anyone 97 and older to request care online for non-urgent symptoms. For details visit [email or web address].   Also download the MyChart app! Go to the app store, search "MyChart", open the app, select [place], and log in with your MyChart username and password.

## 2023-05-07 NOTE — Progress Notes (Signed)
Patient seen by Dr. Truett Perna today  Vitals are within treatment parameters.  Labs reviewed by Dr. Truett Perna and are within treatment parameters.  Per physician team, patient is ready for treatment and there are NO modifications to the treatment plan.    Requesting Flu vaccine today

## 2023-05-09 ENCOUNTER — Ambulatory Visit (HOSPITAL_BASED_OUTPATIENT_CLINIC_OR_DEPARTMENT_OTHER): Payer: Commercial Managed Care - PPO

## 2023-05-09 ENCOUNTER — Inpatient Hospital Stay: Payer: Commercial Managed Care - PPO

## 2023-05-09 VITALS — BP 127/77 | HR 90 | Temp 98.0°F | Resp 18

## 2023-05-09 DIAGNOSIS — C20 Malignant neoplasm of rectum: Secondary | ICD-10-CM

## 2023-05-09 DIAGNOSIS — Z5111 Encounter for antineoplastic chemotherapy: Secondary | ICD-10-CM | POA: Diagnosis not present

## 2023-05-09 MED ORDER — SODIUM CHLORIDE 0.9% FLUSH
10.0000 mL | INTRAVENOUS | Status: DC | PRN
Start: 2023-05-09 — End: 2023-05-09
  Administered 2023-05-09: 10 mL

## 2023-05-09 MED ORDER — HEPARIN SOD (PORK) LOCK FLUSH 100 UNIT/ML IV SOLN
500.0000 [IU] | Freq: Once | INTRAVENOUS | Status: AC | PRN
  Administered 2023-05-09: 500 [IU]

## 2023-05-09 NOTE — Patient Instructions (Signed)

## 2023-05-14 ENCOUNTER — Inpatient Hospital Stay: Payer: Commercial Managed Care - PPO

## 2023-05-14 ENCOUNTER — Inpatient Hospital Stay: Payer: Commercial Managed Care - PPO | Admitting: Nurse Practitioner

## 2023-05-15 ENCOUNTER — Ambulatory Visit (HOSPITAL_BASED_OUTPATIENT_CLINIC_OR_DEPARTMENT_OTHER)
Admission: RE | Admit: 2023-05-15 | Discharge: 2023-05-15 | Disposition: A | Discharge status: Home / Self Care | Payer: Commercial Managed Care - PPO | Source: Ambulatory Visit | Attending: Oncology | Admitting: Oncology

## 2023-05-15 DIAGNOSIS — C20 Malignant neoplasm of rectum: Secondary | ICD-10-CM | POA: Diagnosis present

## 2023-05-15 MED ORDER — IOHEXOL 300 MG/ML  SOLN
100.0000 mL | Freq: Once | INTRAMUSCULAR | Status: AC | PRN
  Administered 2023-05-15: 75 mL via INTRAVENOUS

## 2023-05-16 ENCOUNTER — Inpatient Hospital Stay: Payer: Commercial Managed Care - PPO

## 2023-05-16 ENCOUNTER — Other Ambulatory Visit (HOSPITAL_COMMUNITY): Payer: Self-pay

## 2023-05-16 NOTE — Telephone Encounter (Signed)
Attempted to follow up, PA was denied due to medication not being covered, with manufacturer coupon, pt can pick up CGM for 74.99 per month

## 2023-05-17 ENCOUNTER — Encounter: Payer: Self-pay | Admitting: *Deleted

## 2023-05-19 ENCOUNTER — Other Ambulatory Visit: Payer: Self-pay | Admitting: Oncology

## 2023-05-21 ENCOUNTER — Telehealth: Payer: Self-pay | Admitting: *Deleted

## 2023-05-21 ENCOUNTER — Inpatient Hospital Stay: Payer: Commercial Managed Care - PPO

## 2023-05-21 ENCOUNTER — Ambulatory Visit: Payer: Commercial Managed Care - PPO

## 2023-05-21 ENCOUNTER — Inpatient Hospital Stay: Payer: Commercial Managed Care - PPO | Admitting: Nurse Practitioner

## 2023-05-21 NOTE — Telephone Encounter (Signed)
Called to inquire if he still needs to come in tomorrow since his CT head was normal. Prefers not to. Also asking for fax # so his dentist can send release request for dental work. He has a painful tooth and they will not see him unless he is cleared by oncology. Per Dr. Truett Perna: OK to cancel 11/26 appointment, but does want to see him after he is seen at [facility] on 05/28/23. Scheduling message sent. Provided fax # to have dentist send form.

## 2023-05-21 NOTE — Telephone Encounter (Signed)
Faxed dental clearance note to [facility] [phone].

## 2023-05-22 ENCOUNTER — Inpatient Hospital Stay: Payer: Commercial Managed Care - PPO | Admitting: Nurse Practitioner

## 2023-05-22 ENCOUNTER — Inpatient Hospital Stay: Payer: Commercial Managed Care - PPO

## 2023-05-30 ENCOUNTER — Telehealth: Payer: Self-pay | Admitting: Pharmacist

## 2023-05-30 ENCOUNTER — Inpatient Hospital Stay: Payer: Commercial Managed Care - PPO | Attending: Oncology | Admitting: Oncology

## 2023-05-30 ENCOUNTER — Encounter: Payer: Self-pay | Admitting: Oncology

## 2023-05-30 VITALS — BP 136/83 | HR 96 | Temp 97.9°F | Resp 18 | Ht 72.0 in | Wt 251.0 lb

## 2023-05-30 DIAGNOSIS — G620 Drug-induced polyneuropathy: Secondary | ICD-10-CM | POA: Insufficient documentation

## 2023-05-30 DIAGNOSIS — R2231 Localized swelling, mass and lump, right upper limb: Secondary | ICD-10-CM | POA: Insufficient documentation

## 2023-05-30 DIAGNOSIS — C787 Secondary malignant neoplasm of liver and intrahepatic bile duct: Secondary | ICD-10-CM | POA: Insufficient documentation

## 2023-05-30 DIAGNOSIS — Z800 Family history of malignant neoplasm of digestive organs: Secondary | ICD-10-CM | POA: Insufficient documentation

## 2023-05-30 DIAGNOSIS — C20 Malignant neoplasm of rectum: Secondary | ICD-10-CM

## 2023-05-30 DIAGNOSIS — M549 Dorsalgia, unspecified: Secondary | ICD-10-CM | POA: Diagnosis not present

## 2023-05-30 DIAGNOSIS — Z5111 Encounter for antineoplastic chemotherapy: Secondary | ICD-10-CM | POA: Insufficient documentation

## 2023-05-30 DIAGNOSIS — Z803 Family history of malignant neoplasm of breast: Secondary | ICD-10-CM | POA: Diagnosis not present

## 2023-05-30 DIAGNOSIS — C19 Malignant neoplasm of rectosigmoid junction: Secondary | ICD-10-CM | POA: Diagnosis not present

## 2023-05-30 MED ORDER — LORAZEPAM 0.5 MG PO TABS: 0.5000 mg | ORAL_TABLET | Freq: Three times a day (TID) | ORAL | 0 refills | Status: DC | PRN

## 2023-05-30 MED ORDER — LONSURF 20-8.19 MG PO TABS: 60.0000 mg | ORAL_TABLET | Freq: Two times a day (BID) | ORAL | 0 refills | Status: DC

## 2023-05-30 MED ORDER — LONSURF 20-8.19 MG PO TABS: ORAL_TABLET | ORAL | 0 refills | Status: DC

## 2023-05-30 NOTE — Progress Notes (Signed)
DISCONTINUE OFF PATHWAY REGIMEN - Colorectal   [id number]:FOLFIRI (Leucovorin IV D1 + Fluorouracil IV D1/CIV D1,2 + Irinotecan IV D1) q14 Days:   A cycle is every 14 days:     Irinotecan      Leucovorin      Fluorouracil      Fluorouracil   **Always confirm dose/schedule in your pharmacy ordering system**  REASON: Disease Progression PRIOR TREATMENT: Off Pathway: FOLFIRI (Leucovorin IV D1 + Fluorouracil IV D1/CIV D1,2 + Irinotecan IV D1) q14 Days TREATMENT RESPONSE: Progressive Disease (PD)  START ON PATHWAY REGIMEN - Colorectal     A cycle is every 28 days:     Trifluridine and tipiracil      Bevacizumab-xxxx   **Always confirm dose/schedule in your pharmacy ordering system**  Patient Characteristics: Distant Metastases, Nonsurgical Candidate, Non-KRAS G12C, RAS Mutation Positive/Unknown (BRAF V600 Wild-Type/Unknown), Standard Cytotoxic Therapy, Third Line Standard Cytotoxic Therapy Tumor Location: Rectal Therapeutic Status: Distant Metastases Microsatellite/Mismatch Repair Status: MSS/pMMR BRAF Mutation Status: Wild-Type (no mutation) KRAS/NRAS Mutation Status: Non-KRAS G12C, RAS Mutation Positive Preferred Therapy Approach: Standard Cytotoxic Therapy Standard Cytotoxic Line of Therapy: Third Careers adviser Cytotoxic Therapy Intent of Therapy: Non-Curative / Palliative Intent, Discussed with Patient

## 2023-05-30 NOTE — Telephone Encounter (Signed)
Clinical Pharmacist Practitioner Encounter   Received new prescription for Lonsurf (trifluridine/tipiracil) for the treatment of metastatic rectal cancer in conjunction with bevacizumab, planned duration until disease progression or unacceptable drug toxicity. MD planned start of 06/04/23.   CMP from 05/28/23 (Care Everywhere) assessed, no relevant lab abnormalities. Prescription dose and frequency assessed.   Current medication list in Epic reviewed, no DDIs with Lonsurf identified.   Evaluated chart and no patient barriers to medication adherence identified.   Prescription has been e-scribed to the [facility] for benefits analysis and approval.  Oral Oncology Clinic will continue to follow for insurance authorization, copayment issues, initial counseling and start date.  Patient agreed to treatment on 05/30/23 per MD documentation.  Remi Haggard, PharmD, BCPS, BCOP, CPP Hematology/Oncology Clinical Pharmacist Practitioner [facility] [phone]  05/30/2023 3:05 PM
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
## 2023-05-31 ENCOUNTER — Telehealth: Payer: Self-pay

## 2023-05-31 ENCOUNTER — Encounter: Payer: Self-pay | Admitting: Oncology

## 2023-05-31 ENCOUNTER — Other Ambulatory Visit (HOSPITAL_COMMUNITY): Payer: Self-pay

## 2023-05-31 DIAGNOSIS — C20 Malignant neoplasm of rectum: Secondary | ICD-10-CM

## 2023-05-31 NOTE — Telephone Encounter (Signed)
Clinical Pharmacist Practitioner Encounter   Patient's [name] [name] is pending fill at [facility]. Patient advocate [facility] following up on [name] status.   Patient Education I spoke with patient and his wife for overview of new oral chemotherapy medication:Lonsurf (trifluridine/tipiracil) for [name] treatment of metastatic rectal cancer in conjunction with bevacizumab, planned duration until disease progression or unacceptable drug toxicity.  Counseled patient on administration, dosing, side effects, monitoring, drug-food interactions, safe handling, storage, and disposal. Patient will take 3 tablets (60 mg of trifluridine total) by mouth 2 (two) times daily after a meal. Take 60 mg (trifluridine), take within 1 hr after AM & PM meals on days 1-5, 8-12. Repeat every 28 days.   Side effects include but not limited to: nausea, fatigue, diarrhea, decreased wbc/hgb/plt.   Diarrhea: patient knows to use loperamide as needed and call [name] office if he is have 4 or more loose stools per day Nausea: patient has prn antiemetic on hand to use as needed  Reviewed with patient importance of keeping a medication schedule and plan for any missed doses.  After discussion with patient no patient barriers to medication adherence identified.   [name] [name] voiced understanding and appreciation. All questions answered. Medication handout provided.  Provided patient with Oral Chemotherapy Navigation Clinic phone number. Patient knows to call [name] office with questions or concerns. Oral Chemotherapy Navigation Clinic will continue to follow.  Remi Haggard, PharmD, BCPS, BCOP, CPP Hematology/Oncology Clinical Pharmacist Practitioner [facility] [phone]  05/31/2023 3:40 PM

## 2023-05-31 NOTE — Telephone Encounter (Addendum)
Oral Oncology Patient Advocate Encounter  New authorization   Received notification that prior authorization for Lonsurf is required.   PA submitted via [email or web address] on 05/31/23  Key [id number]  Status is pending   Ardeen Fillers, CPhT Oncology Pharmacy Patient Advocate  [facility]  [phone] (phone) [phone] (fax) 05/31/2023 11:06 AM

## 2023-06-01 ENCOUNTER — Other Ambulatory Visit (HOSPITAL_COMMUNITY): Payer: Self-pay

## 2023-06-01 ENCOUNTER — Telehealth: Payer: Self-pay | Admitting: *Deleted

## 2023-06-01 NOTE — Telephone Encounter (Signed)
Informed [name] [name] that his insurance has not approved the Avastin yet and his plan can take up to 15 days. He is aware and reports there is a delay in getting his Lonsurf approved as well. Informed him that he will be called early Monday am to cancel if chemo still not approved. Per Dr. Truett Perna, can go ahead and start Lonsurf when received.

## 2023-06-04 ENCOUNTER — Telehealth: Payer: Self-pay | Admitting: *Deleted

## 2023-06-04 ENCOUNTER — Inpatient Hospital Stay: Payer: Commercial Managed Care - PPO

## 2023-06-04 ENCOUNTER — Other Ambulatory Visit (HOSPITAL_COMMUNITY): Payer: Self-pay

## 2023-06-04 VITALS — BP 129/88 | HR 82 | Temp 99.1°F | Resp 18 | Ht 72.0 in | Wt 250.5 lb

## 2023-06-04 DIAGNOSIS — C20 Malignant neoplasm of rectum: Secondary | ICD-10-CM

## 2023-06-04 DIAGNOSIS — Z5111 Encounter for antineoplastic chemotherapy: Secondary | ICD-10-CM | POA: Diagnosis not present

## 2023-06-04 LAB — CMP (CANCER CENTER ONLY)
ALT: 27 U/L (ref 0–44)
AST: 20 U/L (ref 15–41)
Albumin: 4.3 g/dL (ref 3.5–5.0)
Alkaline Phosphatase: 108 U/L (ref 38–126)
Anion gap: 10 (ref 5–15)
BUN: 13 mg/dL (ref 8–23)
CO2: 23 mmol/L (ref 22–32)
Calcium: 9.7 mg/dL (ref 8.9–10.3)
Chloride: 103 mmol/L (ref 98–111)
Creatinine: 1 mg/dL (ref 0.61–1.24)
GFR, Estimated: >60 mL/min (ref >60)
Glucose, Bld: 172 mg/dL — ABNORMAL HIGH (ref 70–99)
Potassium: 4.2 mmol/L (ref 3.5–5.1)
Sodium: 136 mmol/L (ref 135–145)
Total Bilirubin: 0.5 mg/dL (ref <1.2)
Total Protein: 7.3 g/dL (ref 6.5–8.1)

## 2023-06-04 LAB — CBC WITH DIFFERENTIAL (CANCER CENTER ONLY)
Abs Immature Granulocytes: 0.02 10*3/uL (ref 0.00–0.07)
Basophils Absolute: 0.1 10*3/uL (ref 0.0–0.1)
Basophils Relative: 2 %
Eosinophils Absolute: 0.3 10*3/uL (ref 0.0–0.5)
Eosinophils Relative: 6 %
HCT: 43.5 % (ref 39.0–52.0)
Hemoglobin: 13.9 g/dL (ref 13.0–17.0)
Immature Granulocytes: 0 %
Lymphocytes Relative: 11 %
Lymphs Abs: 0.6 10*3/uL — ABNORMAL LOW (ref 0.7–4.0)
MCH: 26.9 pg (ref 26.0–34.0)
MCHC: 32 g/dL (ref 30.0–36.0)
MCV: 84.1 fL (ref 80.0–100.0)
Monocytes Absolute: 0.5 10*3/uL (ref 0.1–1.0)
Monocytes Relative: 9 %
Neutro Abs: 3.9 10*3/uL (ref 1.7–7.7)
Neutrophils Relative %: 72 %
Platelet Count: 200 10*3/uL (ref 150–400)
RBC: 5.17 MIL/uL (ref 4.22–5.81)
RDW: 16.9 % — ABNORMAL HIGH (ref 11.5–15.5)
WBC Count: 5.4 10*3/uL (ref 4.0–10.5)
nRBC: 0 % (ref 0.0–0.2)

## 2023-06-04 LAB — TOTAL PROTEIN, URINE DIPSTICK: Protein, ur: NEGATIVE mg/dL

## 2023-06-04 LAB — CEA (ACCESS): CEA (CHCC): 24.52 ng/mL — ABNORMAL HIGH (ref 0.00–5.00)

## 2023-06-04 MED ORDER — SODIUM CHLORIDE 0.9 % IV SOLN
5.0000 mg/kg | Freq: Once | INTRAVENOUS | Status: AC
  Administered 2023-06-04: 600 mg via INTRAVENOUS
  Filled 2023-06-04: qty 16

## 2023-06-04 MED ORDER — SODIUM CHLORIDE 0.9 % IV SOLN
INTRAVENOUS | Status: DC
Start: 2023-06-04 — End: 2023-06-04

## 2023-06-04 MED ORDER — HEPARIN SOD (PORK) LOCK FLUSH 100 UNIT/ML IV SOLN
500.0000 [IU] | Freq: Once | INTRAVENOUS | Status: AC | PRN
  Administered 2023-06-04: 500 [IU]

## 2023-06-04 MED ORDER — SODIUM CHLORIDE 0.9% FLUSH
10.0000 mL | INTRAVENOUS | Status: DC | PRN
  Administered 2023-06-04: 10 mL

## 2023-06-04 NOTE — Telephone Encounter (Signed)
Called patient to come in today for his treatment--bevacizumab-awwb has been approved.  Notified by oral oncology team that insurance has not yet approved the Lonsurf. Could be end of week or Monday before he gets it. MD notified.

## 2023-06-04 NOTE — Patient Instructions (Signed)
[facility] - A DEPT OF [facility]   Discharge Instructions: Thank you for choosing [facility] to provide your oncology and hematology care.   If you have a lab appointment with the Cancer Center, please go directly to the Cancer Center and check in at the registration area.   Wear comfortable clothing and clothing appropriate for easy access to any Portacath or PICC line.   We strive to give you quality time with your provider. You may need to reschedule your appointment if you arrive late (15 or more minutes).  Arriving late affects you and other patients whose appointments are after yours.  Also, if you miss three or more appointments without notifying the office, you may be dismissed from the clinic at the provider's discretion.      For prescription refill requests, have your pharmacy contact our office and allow 72 hours for refills to be completed.    Today you received the following chemotherapy and/or immunotherapy agents Bevacizumab-awwb (MVASI).      To help prevent nausea and vomiting after your treatment, we encourage you to take your nausea medication as directed.  BELOW ARE SYMPTOMS THAT SHOULD BE REPORTED IMMEDIATELY: *FEVER GREATER THAN 100.4 F (38 C) OR HIGHER *CHILLS OR SWEATING *NAUSEA AND VOMITING THAT IS NOT CONTROLLED WITH YOUR NAUSEA MEDICATION *UNUSUAL SHORTNESS OF BREATH *UNUSUAL BRUISING OR BLEEDING *URINARY PROBLEMS (pain or burning when urinating, or frequent urination) *BOWEL PROBLEMS (unusual diarrhea, constipation, pain near the anus) TENDERNESS IN MOUTH AND THROAT WITH OR WITHOUT PRESENCE OF ULCERS (sore throat, sores in mouth, or a toothache) UNUSUAL RASH, SWELLING OR PAIN  UNUSUAL VAGINAL DISCHARGE OR ITCHING   Items with * indicate a potential emergency and should be followed up as soon as possible or go to the Emergency Department if any problems should occur.  Please show the CHEMOTHERAPY ALERT CARD  or IMMUNOTHERAPY ALERT CARD at check-in to the Emergency Department and triage nurse.  Should you have questions after your visit or need to cancel or reschedule your appointment, please contact [facility]  Dept: [phone]  and follow the prompts.  Office hours are 8:00 a.m. to 4:30 p.m. Monday - Friday. Please note that voicemails left after 4:00 p.m. may not be returned until the following business day.  We are closed weekends and major holidays. You have access to a nurse at all times for urgent questions. Please call the main number to the clinic Dept: [phone] and follow the prompts.   For any non-urgent questions, you may also contact your provider using MyChart. We now offer e-Visits for anyone 85 and older to request care online for non-urgent symptoms. For details visit [email or web address].   Also download the MyChart app! Go to the app store, search "MyChart", open the app, select [facility], and log in with your MyChart username and password.  Bevacizumab Injection What is this medication? BEVACIZUMAB (be va SIZ yoo mab) treats some types of cancer. It works by blocking a protein that causes cancer cells to grow and multiply. This helps to slow or stop the spread of cancer cells. It is a monoclonal antibody. This medicine may be used for other purposes; ask your health care provider or pharmacist if you have questions. COMMON BRAND NAME(S): Alymsys, Avastin, MVASI, Omer Jack What should I tell my care team before I take this medication? They need to know if you have any of  these conditions: Blood clots Coughing up blood Having or recent surgery Heart failure High blood pressure History of a connection between 2 or more body parts that do not usually connect (fistula) History of a tear in your stomach or intestines Protein in your urine An unusual or allergic reaction to bevacizumab, other medications, foods, dyes,  or preservatives Pregnant or trying to get pregnant Breast-feeding How should I use this medication? This medication is injected into a vein. It is given by your care team in a hospital or clinic setting. Talk to your care team the use of this medication in children. Special care may be needed. Overdosage: If you think you have taken too much of this medicine contact a poison control center or emergency room at once. NOTE: This medicine is only for you. Do not share this medicine with others. What if I miss a dose? Keep appointments for follow-up doses. It is important not to miss your dose. Call your care team if you are unable to keep an appointment. What may interact with this medication? Interactions are not expected. This list may not describe all possible interactions. Give your health care provider a list of all the medicines, herbs, non-prescription drugs, or dietary supplements you use. Also tell them if you smoke, drink alcohol, or use illegal drugs. Some items may interact with your medicine. What should I watch for while using this medication? Your condition will be monitored carefully while you are receiving this medication. You may need blood work while taking this medication. This medication may make you feel generally unwell. This is not uncommon as chemotherapy can affect healthy cells as well as cancer cells. Report any side effects. Continue your course of treatment even though you feel ill unless your care team tells you to stop. This medication may increase your risk to bruise or bleed. Call your care team if you notice any unusual bleeding. Before having surgery, talk to your care team to make sure it is ok. This medication can increase the risk of poor healing of your surgical site or wound. You will need to stop this medication for 28 days before surgery. After surgery, wait at least 28 days before restarting this medication. Make sure the surgical site or wound is healed  enough before restarting this medication. Talk to your care team if questions. Talk to your care team if you may be pregnant. Serious birth defects can occur if you take this medication during pregnancy and for 6 months after the last dose. Contraception is recommended while taking this medication and for 6 months after the last dose. Your care team can help you find the option that works for you. Do not breastfeed while taking this medication and for 6 months after the last dose. This medication can cause infertility. Talk to your care team if you are concerned about your fertility. What side effects may I notice from receiving this medication? Side effects that you should report to your care team as soon as possible: Allergic reactions--skin rash, itching, hives, swelling of the face, lips, tongue, or throat Bleeding--bloody or black, tar-like stools, vomiting blood or brown material that looks like coffee grounds, red or dark brown urine, small red or purple spots on skin, unusual bruising or bleeding Blood clot--pain, swelling, or warmth in the leg, shortness of breath, chest pain Heart attack--pain or tightness in the chest, shoulders, arms, or jaw, nausea, shortness of breath, cold or clammy skin, feeling faint or lightheaded Heart failure--shortness of breath, swelling  of the ankles, feet, or hands, sudden weight gain, unusual weakness or fatigue Increase in blood pressure Infection--fever, chills, cough, sore throat, wounds that don't heal, pain or trouble when passing urine, general feeling of discomfort or being unwell Infusion reactions--chest pain, shortness of breath or trouble breathing, feeling faint or lightheaded Kidney injury--decrease in the amount of urine, swelling of the ankles, hands, or feet Stomach pain that is severe, does not go away, or gets worse Stroke--sudden numbness or weakness of the face, arm, or leg, trouble speaking, confusion, trouble walking, loss of balance or  coordination, dizziness, severe headache, change in vision Sudden and severe headache, confusion, change in vision, seizures, which may be signs of posterior reversible encephalopathy syndrome (PRES) Side effects that usually do not require medical attention (report to your care team if they continue or are bothersome): Back pain Change in taste Diarrhea Dry skin Increased tears Nosebleed This list may not describe all possible side effects. Call your doctor for medical advice about side effects. You may report side effects to FDA at 1-800-FDA-1088. Where should I keep my medication? This medication is given in a hospital or clinic. It will not be stored at home. NOTE: This sheet is a summary. It may not cover all possible information. If you have questions about this medicine, talk to your doctor, pharmacist, or health care provider.  2024 Elsevier/Gold Standard (2021-10-28 00:00:00)

## 2023-06-05 ENCOUNTER — Telehealth: Payer: Self-pay

## 2023-06-05 NOTE — Telephone Encounter (Signed)
[employer] PromptPA status check results: In progress  Est.Time of Completion: 06/08/2023 09:43:12

## 2023-06-05 NOTE — Telephone Encounter (Signed)
24 Hour Call Back  Telephone call to patient post his first time Bevacizumab infusion. Unable to reach patient, left a voice message.

## 2023-06-06 ENCOUNTER — Encounter: Payer: Self-pay | Admitting: *Deleted

## 2023-06-07 NOTE — Telephone Encounter (Signed)
Received notification via fax that additional information was required to continue processing Prior Authorization Request. That has been faxed to [employer] at [phone].    Ardeen Fillers, CPhT Oncology Pharmacy Patient Advocate  [facility]  [phone] (phone) [phone] (fax) 06/07/2023 4:13 PM

## 2023-06-08 NOTE — Telephone Encounter (Signed)
PA denied by insurance on 06/08/23  Expedited appeal faxed to insurance on 06/08/23

## 2023-06-11 ENCOUNTER — Telehealth: Payer: Self-pay

## 2023-06-11 ENCOUNTER — Other Ambulatory Visit (HOSPITAL_COMMUNITY): Payer: Self-pay

## 2023-06-11 NOTE — Telephone Encounter (Signed)
Appeal for Lonsurf was denied. Karna Dupes has begun [employer] assistance process.

## 2023-06-11 NOTE — Telephone Encounter (Signed)
Oral Oncology Patient Advocate Encounter   Began application for assistance for Lonsurf through [facility] Patient Assistance Program.   Application will be submitted upon completion of necessary supporting documentation.   [employer]'s phone number [phone].   I will continue to check the status until final determination.    Ardeen Fillers, CPhT Oncology Pharmacy Patient Advocate  [facility]  [phone] (phone) [phone] (fax) 06/11/2023 11:08 AM

## 2023-06-11 NOTE — Telephone Encounter (Signed)
Received back patient signatures. MD office has been notified and application has been sent to office for MD signatures. I will submit once back in hand. I will continue to follow and update until final determination.    Ardeen Fillers, CPhT Oncology Pharmacy Patient Advocate  [facility]  [phone] (phone) [phone] (fax) 06/11/2023 11:21 AM

## 2023-06-11 NOTE — Telephone Encounter (Signed)
Patient will start Lonsurf using medication from the office:   Lonsurf 15mg /6.14mg : Patient will take 4 tablets (60 mg of trifluridine total) by mouth 2 (two) times daily after a meal. Take 60 mg (trifluridine), take within 1 hr after AM & PM meals on days 1-5, 8-12. Repeat every 28 days.

## 2023-06-11 NOTE — Telephone Encounter (Signed)
Oral Oncology Patient Advocate Encounter  Reached out and spoke with patient regarding PAP paperwork, explained that I would send it to their preferred email via DocuSign.   Confirmed email address: [email or web address]    Patient expressed understanding and consent.  Will follow up once paperwork has been signed and returned.   Ardeen Fillers, CPhT Oncology Pharmacy Patient Advocate  [facility]  [phone] (phone) [phone] (fax) 06/11/2023 11:09 AM

## 2023-06-12 ENCOUNTER — Encounter: Payer: Self-pay | Admitting: *Deleted

## 2023-06-12 NOTE — Telephone Encounter (Signed)
Second level appeal sent in on 06/11/23.

## 2023-06-12 NOTE — Progress Notes (Signed)
Mrs. Shuford came in to pick up Lonsurf samples left by oral oncology team. He will start tonight. Mr. Stoffers reports his son tested positive for covid, but thus far, patient has not. Wife reports they will continue to monitor. Informed her if he tests positive be sure to call his PCP to get started on Paxlovid and let our office know as well.

## 2023-06-12 NOTE — Telephone Encounter (Signed)
Oral Oncology Patient Advocate Encounter   Submitted application for assistance for Lonsurf to [facility] Patient Assistance Program.   Application submitted via e-fax to [phone]   [facility]'s phone number [phone].   I will continue to check the status until final determination.    Ardeen Fillers, CPhT Oncology Pharmacy Patient Advocate  [facility]  [phone] (phone) [phone] (fax) 06/12/2023 10:42 AM

## 2023-06-13 ENCOUNTER — Encounter: Payer: Self-pay | Admitting: Family Medicine

## 2023-06-13 ENCOUNTER — Telehealth: Payer: Commercial Managed Care - PPO | Admitting: Family Medicine

## 2023-06-13 DIAGNOSIS — U071 COVID-19: Secondary | ICD-10-CM | POA: Diagnosis not present

## 2023-06-13 MED ORDER — NIRMATRELVIR/RITONAVIR (PAXLOVID)TABLET: 3.0000 | ORAL_TABLET | Freq: Two times a day (BID) | ORAL | 0 refills | Status: DC

## 2023-06-13 NOTE — Progress Notes (Signed)
Virtual Visit via MyChart video note  I connected with [name] [name] on 06/13/23 at 1323 by video and verified that I am speaking with the correct person using two identifiers. [name] [name] is currently located at home and patient are currently with her during visit. The provider, Elige Radon Zackeriah Kissler, MD is located in their office at time of visit.  Call ended at 1334  I discussed the limitations, risks, security and privacy concerns of performing an evaluation and management service by video and the availability of in person appointments. I also discussed with the patient that there may be a patient responsible charge related to this service. The patient expressed understanding and agreed to proceed.   History and Present Illness: Patient is calling in for positive covid test and he has multiple family members tested positive.  He has sore throat and congestion and chills.  But chills feel better today. He is not having fevers. He denies SOB or wheezing. He is using magic mouthwash.  He is using mucinex and nyquil and cough drops.   1. COVID-19 virus infection     Outpatient Encounter Medications as of 06/13/2023  Medication Sig   nirmatrelvir/ritonavir (PAXLOVID) 20 x 150 MG & 10 x 100MG  TABS Take 3 tablets by mouth 2 (two) times daily for 5 days. (Take nirmatrelvir 150 mg two tablets twice daily for 5 days and ritonavir 100 mg one tablet twice daily for 5 days) Patient GFR is 60   amoxicillin (AMOXIL) 500 MG capsule Take 500 mg by mouth 3 (three) times daily.   Continuous Glucose Sensor (FREESTYLE LIBRE 3 SENSOR) MISC Place 1 sensor on the skin every 14 days. Use to check glucose continuously.  DX: E11.65   Dulaglutide (TRULICITY) 1.5 MG/0.5ML SOPN Inject 1.5 mg into the skin once a week.   empagliflozin (JARDIANCE) 25 MG TABS tablet Take 1 tablet (25 mg total) by mouth daily before breakfast.   fluticasone (FLONASE) 50 MCG/ACT nasal spray Place 2 sprays into both nostrils  daily. (Patient not taking: Reported on 05/30/2023)   Insulin Pen Needle 29G X MISC 1 Application by Does not apply route at bedtime. (Patient not taking: Reported on 06/04/2023)   levocetirizine (XYZAL) 5 MG tablet Take 1 tablet (5 mg total) by mouth every evening.   LORazepam (ATIVAN) 0.5 MG tablet Take 1 tablet (0.5 mg total) by mouth every 8 (eight) hours as needed (nausea). (Patient not taking: Reported on 06/04/2023)   metFORMIN (GLUCOPHAGE) 500 MG tablet Take 1 tablet (500 mg total) by mouth 2 (two) times daily with a meal.   montelukast (SINGULAIR) 10 MG tablet Take 1 tablet (10 mg total) by mouth at bedtime.   naproxen sodium (ALEVE) 220 MG tablet Take 440 mg by mouth 2 (two) times daily.   omeprazole (PRILOSEC) 40 MG capsule Take 1 capsule (40 mg total) by mouth daily as needed. For acid reflux   ondansetron (ZOFRAN) 8 MG tablet Take 1 tablet (8 mg total) by mouth every 8 (eight) hours as needed for nausea or vomiting. (Patient not taking: Reported on 06/04/2023)   ondansetron (ZOFRAN-ODT) 4 MG disintegrating tablet Take 1 tablet (4 mg total) by mouth every 8 (eight) hours as needed for nausea or vomiting. (Patient not taking: Reported on 06/04/2023)   oxyCODONE-acetaminophen (PERCOCET/ROXICET) 5-325 MG tablet Take 1 tablet by mouth every 4 (four) hours as needed for moderate pain (pain score 4-6). (Patient not taking: Reported on 06/04/2023)   prochlorperazine (COMPAZINE) 10 MG tablet Take  1 tablet (10 mg total) by mouth every 6 (six) hours as needed for nausea or vomiting. (Patient not taking: Reported on 06/04/2023)   rosuvastatin (CRESTOR) 10 MG tablet Take 1 tablet (10 mg total) by mouth daily.   tamsulosin (FLOMAX) 0.4 MG CAPS capsule Take 2 capsules (0.8 mg total) by mouth daily.   trifluridine-tipiracil (LONSURF) 20-8.19 MG tablet Take 3 tablets (60 mg of trifluridine total) by mouth 2 (two) times daily after a meal. Take 60 mg (trifluridine), take within 1 hr after AM & PM meals on days  1-5, 8-12. Repeat every 28 days.   No facility-administered encounter medications on file as of 06/13/2023.    Review of Systems  Constitutional:  Positive for chills. Negative for fever.  HENT:  Positive for congestion, postnasal drip and rhinorrhea. Negative for ear discharge, ear pain, sinus pressure, sneezing, sore throat and voice change.   Eyes:  Negative for pain, discharge, redness and visual disturbance.  Respiratory:  Positive for cough. Negative for shortness of breath and wheezing.   Cardiovascular:  Negative for chest pain and leg swelling.  Musculoskeletal:  Negative for gait problem.  Skin:  Negative for rash.  All other systems reviewed and are negative.   Observations/Objective: Patient sounds comfortable and in no acute distress  Assessment and Plan: Problem List Items Addressed This Visit   None Visit Diagnoses       COVID-19 virus infection    -  Primary   Relevant Medications   nirmatrelvir/ritonavir (PAXLOVID) 20 x 150 MG & 10 x 100MG  TABS       Tested positive for COVID at home and is immunosuppressed, recommended he start it soon as possible and to keep in touch with his oncology if he does start spiking fevers or anything like that. Follow up plan: Return if symptoms worsen or fail to improve.     I discussed the assessment and treatment plan with the patient. The patient was provided an opportunity to ask questions and all were answered. The patient agreed with the plan and demonstrated an understanding of the instructions.   The patient was advised to call back or seek an in-person evaluation if the symptoms worsen or if the condition fails to improve as anticipated.  The above assessment and management plan was discussed with the patient. The patient verbalized understanding of and has agreed to the management plan. Patient is aware to call the clinic if symptoms persist or worsen. Patient is aware when to return to the clinic for a follow-up visit.  Patient educated on when it is appropriate to go to the emergency department.    I provided 11 minutes of non-face-to-face time during this encounter.    [name] Pyle, MD

## 2023-06-14 NOTE — Telephone Encounter (Signed)
Oral Oncology Patient Advocate Encounter   Received notification that the application for assistance for Lonsurf through [facility] Patient Assistance Program has been denied due to patient is commercially insured.   [employer] PAP's phone number [phone].   Ardeen Fillers, CPhT Oncology Pharmacy Patient Advocate  [facility]  [phone] (phone) [phone] (fax) 06/14/2023 8:31 AM

## 2023-06-15 NOTE — Telephone Encounter (Addendum)
Lonsurf second level appeal was denied on 06/14/23. Third and final level appeal submitted on 06/15/23  Will attempt appeal denial  through [facility] Department of Insurance  if third appeal is denied.

## 2023-06-17 ENCOUNTER — Other Ambulatory Visit: Payer: Self-pay | Admitting: Oncology

## 2023-06-18 ENCOUNTER — Inpatient Hospital Stay: Payer: Commercial Managed Care - PPO

## 2023-06-18 ENCOUNTER — Inpatient Hospital Stay: Payer: Commercial Managed Care - PPO | Admitting: Nurse Practitioner

## 2023-06-18 ENCOUNTER — Telehealth: Payer: Self-pay | Admitting: *Deleted

## 2023-06-18 ENCOUNTER — Encounter: Payer: Self-pay | Admitting: Nurse Practitioner

## 2023-06-18 VITALS — BP 129/89 | HR 92 | Temp 98.2°F | Resp 18 | Ht 72.0 in | Wt 249.0 lb

## 2023-06-18 VITALS — BP 138/92 | HR 81 | Resp 18

## 2023-06-18 DIAGNOSIS — C20 Malignant neoplasm of rectum: Secondary | ICD-10-CM

## 2023-06-18 DIAGNOSIS — Z5111 Encounter for antineoplastic chemotherapy: Secondary | ICD-10-CM | POA: Diagnosis not present

## 2023-06-18 LAB — CBC WITH DIFFERENTIAL (CANCER CENTER ONLY)
Abs Immature Granulocytes: 0.02 10*3/uL (ref 0.00–0.07)
Basophils Absolute: 0.1 10*3/uL (ref 0.0–0.1)
Basophils Relative: 1 %
Eosinophils Absolute: 0.2 10*3/uL (ref 0.0–0.5)
Eosinophils Relative: 5 %
HCT: 45 % (ref 39.0–52.0)
Hemoglobin: 14.4 g/dL (ref 13.0–17.0)
Immature Granulocytes: 0 %
Lymphocytes Relative: 11 %
Lymphs Abs: 0.5 10*3/uL — ABNORMAL LOW (ref 0.7–4.0)
MCH: 26.6 pg (ref 26.0–34.0)
MCHC: 32 g/dL (ref 30.0–36.0)
MCV: 83 fL (ref 80.0–100.0)
Monocytes Absolute: 0.4 10*3/uL (ref 0.1–1.0)
Monocytes Relative: 8 %
Neutro Abs: 3.8 10*3/uL (ref 1.7–7.7)
Neutrophils Relative %: 75 %
Platelet Count: 175 10*3/uL (ref 150–400)
RBC: 5.42 MIL/uL (ref 4.22–5.81)
RDW: 15.9 % — ABNORMAL HIGH (ref 11.5–15.5)
WBC Count: 5 10*3/uL (ref 4.0–10.5)
nRBC: 0 % (ref 0.0–0.2)

## 2023-06-18 LAB — CMP (CANCER CENTER ONLY)
ALT: 25 U/L (ref 0–44)
AST: 18 U/L (ref 15–41)
Albumin: 4.1 g/dL (ref 3.5–5.0)
Alkaline Phosphatase: 104 U/L (ref 38–126)
Anion gap: 9 (ref 5–15)
BUN: 17 mg/dL (ref 8–23)
CO2: 24 mmol/L (ref 22–32)
Calcium: 9 mg/dL (ref 8.9–10.3)
Chloride: 103 mmol/L (ref 98–111)
Creatinine: 0.92 mg/dL (ref 0.61–1.24)
GFR, Estimated: >60 mL/min (ref >60)
Glucose, Bld: 160 mg/dL — ABNORMAL HIGH (ref 70–99)
Potassium: 4.5 mmol/L (ref 3.5–5.1)
Sodium: 136 mmol/L (ref 135–145)
Total Bilirubin: 0.5 mg/dL (ref <1.2)
Total Protein: 7.2 g/dL (ref 6.5–8.1)

## 2023-06-18 LAB — CEA (ACCESS): CEA (CHCC): 15.18 ng/mL — ABNORMAL HIGH (ref 0.00–5.00)

## 2023-06-18 MED ORDER — SODIUM CHLORIDE 0.9% FLUSH
10.0000 mL | INTRAVENOUS | Status: DC | PRN
  Administered 2023-06-18: 10 mL

## 2023-06-18 MED ORDER — SODIUM CHLORIDE 0.9 % IV SOLN: INTRAVENOUS | Status: DC

## 2023-06-18 MED ORDER — HEPARIN SOD (PORK) LOCK FLUSH 100 UNIT/ML IV SOLN
500.0000 [IU] | Freq: Once | INTRAVENOUS | Status: AC | PRN
  Administered 2023-06-18: 500 [IU]

## 2023-06-18 MED ORDER — BEVACIZUMAB-AWWB CHEMO INJECTION 400 MG/16ML
5.0000 mg/kg | Freq: Once | INTRAVENOUS | Status: AC
  Administered 2023-06-18: 600 mg via INTRAVENOUS
  Filled 2023-06-18: qty 16

## 2023-06-18 NOTE — Telephone Encounter (Signed)
Noted message left from answering service saying" Caller states the patient has COVID and just tested negative this morning and wants to know if he can be seen today?" Called and left VM requesting when he tested positive and did he reach out to PCP to get started on treatment. If he gets to office before he gets message, please put on mask.
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
## 2023-06-18 NOTE — Progress Notes (Signed)
Patient seen by Lonna Cobb NP today  Vitals are within treatment parameters:Yes   Labs are within treatment parameters: Yes   Treatment plan has been signed: Yes   Per physician team, Patient is ready for treatment and there are NO modifications to the treatment plan.

## 2023-06-18 NOTE — Patient Instructions (Signed)
 [facility] A DEPT OF [facility]   Discharge Instructions: Thank you for choosing [facility] to provide your oncology and hematology care.   If you have a lab appointment with the Cancer Center, please go directly to the Cancer Center and check in at the registration area.   Wear comfortable clothing and clothing appropriate for easy access to any Portacath or PICC line.   We strive to give you quality time with your provider. You may need to reschedule your appointment if you arrive late (15 or more minutes).  Arriving late affects you and other patients whose appointments are after yours.  Also, if you miss three or more appointments without notifying the office, you may be dismissed from the clinic at the provider's discretion.      For prescription refill requests, have your pharmacy contact our office and allow 72 hours for refills to be completed.    Today you received the following chemotherapy and/or immunotherapy agents Bevacizumab-awwb (MVASI).      To help prevent nausea and vomiting after your treatment, we encourage you to take your nausea medication as directed.  BELOW ARE SYMPTOMS THAT SHOULD BE REPORTED IMMEDIATELY: *FEVER GREATER THAN 100.4 F (38 C) OR HIGHER *CHILLS OR SWEATING *NAUSEA AND VOMITING THAT IS NOT CONTROLLED WITH YOUR NAUSEA MEDICATION *UNUSUAL SHORTNESS OF BREATH *UNUSUAL BRUISING OR BLEEDING *URINARY PROBLEMS (pain or burning when urinating, or frequent urination) *BOWEL PROBLEMS (unusual diarrhea, constipation, pain near the anus) TENDERNESS IN MOUTH AND THROAT WITH OR WITHOUT PRESENCE OF ULCERS (sore throat, sores in mouth, or a toothache) UNUSUAL RASH, SWELLING OR PAIN  UNUSUAL VAGINAL DISCHARGE OR ITCHING   Items with * indicate a potential emergency and should be followed up as soon as possible or go to the Emergency Department if any problems should occur.  Please show the CHEMOTHERAPY ALERT CARD  or IMMUNOTHERAPY ALERT CARD at check-in to the Emergency Department and triage nurse.  Should you have questions after your visit or need to cancel or reschedule your appointment, please contact [facility]  Dept: [phone]  and follow the prompts.  Office hours are 8:00 a.m. to 4:30 p.m. Monday - Friday. Please note that voicemails left after 4:00 p.m. may not be returned until the following business day.  We are closed weekends and major holidays. You have access to a nurse at all times for urgent questions. Please call the main number to the clinic Dept: [phone] and follow the prompts.   For any non-urgent questions, you may also contact your provider using MyChart. We now offer e-Visits for anyone 85 and older to request care online for non-urgent symptoms. For details visit [email or web address].   Also download the MyChart app! Go to the app store, search "MyChart", open the app, select [facility], and log in with your MyChart username and password.  Bevacizumab Injection What is this medication? BEVACIZUMAB (be va SIZ yoo mab) treats some types of cancer. It works by blocking a protein that causes cancer cells to grow and multiply. This helps to slow or stop the spread of cancer cells. It is a monoclonal antibody. This medicine may be used for other purposes; ask your health care provider or pharmacist if you have questions. COMMON BRAND NAME(S): Alymsys, Avastin, MVASI, Omer Jack What should I tell my care team before I take this medication? They need to know if you have any of  these conditions: Blood clots Coughing up blood Having or recent surgery Heart failure High blood pressure History of a connection between 2 or more body parts that do not usually connect (fistula) History of a tear in your stomach or intestines Protein in your urine An unusual or allergic reaction to bevacizumab, other medications, foods, dyes,  or preservatives Pregnant or trying to get pregnant Breast-feeding How should I use this medication? This medication is injected into a vein. It is given by your care team in a hospital or clinic setting. Talk to your care team the use of this medication in children. Special care may be needed. Overdosage: If you think you have taken too much of this medicine contact a poison control center or emergency room at once. NOTE: This medicine is only for you. Do not share this medicine with others. What if I miss a dose? Keep appointments for follow-up doses. It is important not to miss your dose. Call your care team if you are unable to keep an appointment. What may interact with this medication? Interactions are not expected. This list may not describe all possible interactions. Give your health care provider a list of all the medicines, herbs, non-prescription drugs, or dietary supplements you use. Also tell them if you smoke, drink alcohol, or use illegal drugs. Some items may interact with your medicine. What should I watch for while using this medication? Your condition will be monitored carefully while you are receiving this medication. You may need blood work while taking this medication. This medication may make you feel generally unwell. This is not uncommon as chemotherapy can affect healthy cells as well as cancer cells. Report any side effects. Continue your course of treatment even though you feel ill unless your care team tells you to stop. This medication may increase your risk to bruise or bleed. Call your care team if you notice any unusual bleeding. Before having surgery, talk to your care team to make sure it is ok. This medication can increase the risk of poor healing of your surgical site or wound. You will need to stop this medication for 28 days before surgery. After surgery, wait at least 28 days before restarting this medication. Make sure the surgical site or wound is healed  enough before restarting this medication. Talk to your care team if questions. Talk to your care team if you may be pregnant. Serious birth defects can occur if you take this medication during pregnancy and for 6 months after the last dose. Contraception is recommended while taking this medication and for 6 months after the last dose. Your care team can help you find the option that works for you. Do not breastfeed while taking this medication and for 6 months after the last dose. This medication can cause infertility. Talk to your care team if you are concerned about your fertility. What side effects may I notice from receiving this medication? Side effects that you should report to your care team as soon as possible: Allergic reactions--skin rash, itching, hives, swelling of the face, lips, tongue, or throat Bleeding--bloody or black, tar-like stools, vomiting blood or brown material that looks like coffee grounds, red or dark brown urine, small red or purple spots on skin, unusual bruising or bleeding Blood clot--pain, swelling, or warmth in the leg, shortness of breath, chest pain Heart attack--pain or tightness in the chest, shoulders, arms, or jaw, nausea, shortness of breath, cold or clammy skin, feeling faint or lightheaded Heart failure--shortness of breath, swelling  of the ankles, feet, or hands, sudden weight gain, unusual weakness or fatigue Increase in blood pressure Infection--fever, chills, cough, sore throat, wounds that don't heal, pain or trouble when passing urine, general feeling of discomfort or being unwell Infusion reactions--chest pain, shortness of breath or trouble breathing, feeling faint or lightheaded Kidney injury--decrease in the amount of urine, swelling of the ankles, hands, or feet Stomach pain that is severe, does not go away, or gets worse Stroke--sudden numbness or weakness of the face, arm, or leg, trouble speaking, confusion, trouble walking, loss of balance or  coordination, dizziness, severe headache, change in vision Sudden and severe headache, confusion, change in vision, seizures, which may be signs of posterior reversible encephalopathy syndrome (PRES) Side effects that usually do not require medical attention (report to your care team if they continue or are bothersome): Back pain Change in taste Diarrhea Dry skin Increased tears Nosebleed This list may not describe all possible side effects. Call your doctor for medical advice about side effects. You may report side effects to FDA at 1-800-FDA-1088. Where should I keep my medication? This medication is given in a hospital or clinic. It will not be stored at home. NOTE: This sheet is a summary. It may not cover all possible information. If you have questions about this medicine, talk to your doctor, pharmacist, or health care provider.  2024 Elsevier/Gold Standard (2021-10-28 00:00:00)

## 2023-06-19 ENCOUNTER — Other Ambulatory Visit (HOSPITAL_COMMUNITY): Payer: Self-pay

## 2023-06-22 ENCOUNTER — Other Ambulatory Visit (HOSPITAL_COMMUNITY): Payer: Self-pay

## 2023-06-25 NOTE — Telephone Encounter (Signed)
Signed Provider Request to begin Federal External Review (third level appeal) submitted on 06/25/23

## 2023-06-26 MED ORDER — LONSURF 20-8.19 MG PO TABS: 60.0000 mg | ORAL_TABLET | Freq: Two times a day (BID) | ORAL | 0 refills | Status: DC

## 2023-06-26 NOTE — Telephone Encounter (Signed)
 Oral Oncology Patient Advocate Encounter  Prior Authorization for Lonsurf  has been approved.    PA# [id number]  Effective dates: 06/26/23 through 06/24/24  Patient must fill through [facility].  Script and all supporting documents sent to [facility] for processing and fulfillment.  Patient is aware that script must be filled through [facility] and knows to expect a call from them in the next few days to schedule first fill. Patient also knows to call me at [phone] with any questions or concerns regarding receiving medication or if a co-pay card is needed to help with cost of co-pay.     Morene Potters, CPhT Oncology Pharmacy Patient Advocate  [facility]  [phone] (phone) [phone] (fax) 06/26/2023 1:50 PM

## 2023-06-26 NOTE — Telephone Encounter (Signed)
 Third Level Appeal Development worker, international aid External Review) resulted in [employer] approval!

## 2023-06-26 NOTE — Addendum Note (Signed)
 Addended by: Remi Haggard on: 06/26/2023 01:53 PM   Modules accepted: Orders

## 2023-06-29 ENCOUNTER — Other Ambulatory Visit: Payer: Self-pay

## 2023-06-29 ENCOUNTER — Other Ambulatory Visit (HOSPITAL_COMMUNITY): Payer: Self-pay

## 2023-06-29 ENCOUNTER — Other Ambulatory Visit: Payer: Self-pay | Admitting: Oncology

## 2023-06-29 DIAGNOSIS — N401 Enlarged prostate with lower urinary tract symptoms: Secondary | ICD-10-CM

## 2023-06-29 DIAGNOSIS — E1169 Type 2 diabetes mellitus with other specified complication: Secondary | ICD-10-CM

## 2023-06-29 DIAGNOSIS — C20 Malignant neoplasm of rectum: Secondary | ICD-10-CM

## 2023-06-29 MED ORDER — ROSUVASTATIN CALCIUM 10 MG PO TABS: 10.0000 mg | ORAL_TABLET | Freq: Every day | ORAL | 0 refills | Status: DC

## 2023-06-29 MED ORDER — TAMSULOSIN HCL 0.4 MG PO CAPS: 0.8000 mg | ORAL_CAPSULE | Freq: Every day | ORAL | 0 refills | Status: DC

## 2023-06-29 NOTE — Telephone Encounter (Signed)
 Received the following message from [facility] We spoke with the PA department, we confirmed there was not a cost override available. Can you please submit a plan limitations PA for cost exceeds?  Patient advocate Estefana called the insurance on 06/29/23 to inquire about the insurance. Insurance stated they only approved the medication for 30 tablets for 30 days and they would need a letter of necessity to approve the 60 tablets for 28 days. Letter of necessity and office notes were faxed to the insurance on 06/29/23.  Patient's wife updated with the above and we will continue to follow up on the status.

## 2023-06-30 ENCOUNTER — Other Ambulatory Visit: Payer: Self-pay | Admitting: Oncology

## 2023-07-02 ENCOUNTER — Other Ambulatory Visit: Payer: Self-pay | Admitting: *Deleted

## 2023-07-02 DIAGNOSIS — N183 Chronic kidney disease, stage 3 unspecified: Secondary | ICD-10-CM

## 2023-07-02 MED ORDER — METFORMIN HCL 500 MG PO TABS: 500.0000 mg | ORAL_TABLET | Freq: Two times a day (BID) | ORAL | 0 refills | Status: DC

## 2023-07-03 ENCOUNTER — Inpatient Hospital Stay: Payer: Commercial Managed Care - PPO

## 2023-07-03 ENCOUNTER — Other Ambulatory Visit: Payer: Commercial Managed Care - PPO

## 2023-07-03 ENCOUNTER — Encounter: Payer: Self-pay | Admitting: Nurse Practitioner

## 2023-07-03 ENCOUNTER — Inpatient Hospital Stay: Payer: Commercial Managed Care - PPO | Attending: Oncology | Admitting: Nurse Practitioner

## 2023-07-03 VITALS — BP 144/89 | HR 94 | Temp 98.2°F | Resp 18 | Ht 72.0 in | Wt 253.9 lb

## 2023-07-03 VITALS — BP 142/87 | HR 87

## 2023-07-03 DIAGNOSIS — Z5111 Encounter for antineoplastic chemotherapy: Secondary | ICD-10-CM | POA: Diagnosis present

## 2023-07-03 DIAGNOSIS — Z800 Family history of malignant neoplasm of digestive organs: Secondary | ICD-10-CM | POA: Diagnosis not present

## 2023-07-03 DIAGNOSIS — Z803 Family history of malignant neoplasm of breast: Secondary | ICD-10-CM | POA: Diagnosis not present

## 2023-07-03 DIAGNOSIS — C20 Malignant neoplasm of rectum: Secondary | ICD-10-CM

## 2023-07-03 DIAGNOSIS — G620 Drug-induced polyneuropathy: Secondary | ICD-10-CM | POA: Insufficient documentation

## 2023-07-03 DIAGNOSIS — C787 Secondary malignant neoplasm of liver and intrahepatic bile duct: Secondary | ICD-10-CM | POA: Diagnosis not present

## 2023-07-03 DIAGNOSIS — C19 Malignant neoplasm of rectosigmoid junction: Secondary | ICD-10-CM | POA: Insufficient documentation

## 2023-07-03 LAB — CBC WITH DIFFERENTIAL (CANCER CENTER ONLY)
Abs Immature Granulocytes: 0.01 10*3/uL (ref 0.00–0.07)
Basophils Absolute: 0 10*3/uL (ref 0.0–0.1)
Basophils Relative: 1 %
Eosinophils Absolute: 0.1 10*3/uL (ref 0.0–0.5)
Eosinophils Relative: 3 %
HCT: 39.6 % (ref 39.0–52.0)
Hemoglobin: 12.8 g/dL — ABNORMAL LOW (ref 13.0–17.0)
Immature Granulocytes: 0 %
Lymphocytes Relative: 13 %
Lymphs Abs: 0.5 10*3/uL — ABNORMAL LOW (ref 0.7–4.0)
MCH: 27.3 pg (ref 26.0–34.0)
MCHC: 32.3 g/dL (ref 30.0–36.0)
MCV: 84.4 fL (ref 80.0–100.0)
Monocytes Absolute: 0.3 10*3/uL (ref 0.1–1.0)
Monocytes Relative: 8 %
Neutro Abs: 2.7 10*3/uL (ref 1.7–7.7)
Neutrophils Relative %: 75 %
Platelet Count: 195 10*3/uL (ref 150–400)
RBC: 4.69 MIL/uL (ref 4.22–5.81)
RDW: 18.4 % — ABNORMAL HIGH (ref 11.5–15.5)
WBC Count: 3.6 10*3/uL — ABNORMAL LOW (ref 4.0–10.5)
nRBC: 0 % (ref 0.0–0.2)

## 2023-07-03 LAB — CMP (CANCER CENTER ONLY)
ALT: 30 U/L (ref 0–44)
AST: 22 U/L (ref 15–41)
Albumin: 4.2 g/dL (ref 3.5–5.0)
Alkaline Phosphatase: 84 U/L (ref 38–126)
Anion gap: 10 (ref 5–15)
BUN: 13 mg/dL (ref 8–23)
CO2: 21 mmol/L — ABNORMAL LOW (ref 22–32)
Calcium: 8.9 mg/dL (ref 8.9–10.3)
Chloride: 106 mmol/L (ref 98–111)
Creatinine: 0.75 mg/dL (ref 0.61–1.24)
GFR, Estimated: >60 mL/min (ref >60)
Glucose, Bld: 189 mg/dL — ABNORMAL HIGH (ref 70–99)
Potassium: 4.1 mmol/L (ref 3.5–5.1)
Sodium: 137 mmol/L (ref 135–145)
Total Bilirubin: 0.6 mg/dL (ref 0.0–1.2)
Total Protein: 7 g/dL (ref 6.5–8.1)

## 2023-07-03 LAB — CEA (ACCESS): CEA (CHCC): 21.17 ng/mL — ABNORMAL HIGH (ref 0.00–5.00)

## 2023-07-03 LAB — TOTAL PROTEIN, URINE DIPSTICK: Protein, ur: NEGATIVE mg/dL

## 2023-07-03 MED ORDER — SODIUM CHLORIDE 0.9 % IV SOLN
5.0000 mg/kg | Freq: Once | INTRAVENOUS | Status: AC
  Administered 2023-07-03: 600 mg via INTRAVENOUS
  Filled 2023-07-03: qty 16

## 2023-07-03 MED ORDER — SODIUM CHLORIDE 0.9% FLUSH
10.0000 mL | INTRAVENOUS | Status: DC | PRN
  Administered 2023-07-03: 10 mL

## 2023-07-03 MED ORDER — HEPARIN SOD (PORK) LOCK FLUSH 100 UNIT/ML IV SOLN
500.0000 [IU] | Freq: Once | INTRAVENOUS | Status: AC | PRN
  Administered 2023-07-03: 500 [IU]

## 2023-07-03 MED ORDER — SODIUM CHLORIDE 0.9 % IV SOLN: INTRAVENOUS | Status: DC
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
## 2023-07-03 NOTE — Progress Notes (Signed)
 Patient seen by Dr. Arley Hof today  Vitals are within treatment parameters:Yes   Labs are within treatment parameters: Yes   Treatment plan has been signed: Yes   Per physician team, Patient is ready for treatment and there are NO modifications to the treatment plan.

## 2023-07-03 NOTE — Patient Instructions (Signed)
 [facility]   Discharge Instructions: Thank you for choosing [facility] to provide your oncology and hematology care.   If you have a lab appointment with the Cancer Center, please go directly to the Cancer Center and check in at the registration area.   Wear comfortable clothing and clothing appropriate for easy access to any Portacath or PICC line.   We strive to give you quality time with your provider. You may need to reschedule your appointment if you arrive late (15 or more minutes).  Arriving late affects you and other patients whose appointments are after yours.  Also, if you miss three or more appointments without notifying the office, you may be dismissed from the clinic at the provider's discretion.      For prescription refill requests, have your pharmacy contact our office and allow 72 hours for refills to be completed.    Today you received the following chemotherapy and/or immunotherapy agents AVASTIN.      To help prevent nausea and vomiting after your treatment, we encourage you to take your nausea medication as directed.  BELOW ARE SYMPTOMS THAT SHOULD BE REPORTED IMMEDIATELY: *FEVER GREATER THAN 100.4 F (38 C) OR HIGHER *CHILLS OR SWEATING *NAUSEA AND VOMITING THAT IS NOT CONTROLLED WITH YOUR NAUSEA MEDICATION *UNUSUAL SHORTNESS OF BREATH *UNUSUAL BRUISING OR BLEEDING *URINARY PROBLEMS (pain or burning when urinating, or frequent urination) *BOWEL PROBLEMS (unusual diarrhea, constipation, pain near the anus) TENDERNESS IN MOUTH AND THROAT WITH OR WITHOUT PRESENCE OF ULCERS (sore throat, sores in mouth, or a toothache) UNUSUAL RASH, SWELLING OR PAIN  UNUSUAL VAGINAL DISCHARGE OR ITCHING   Items with * indicate a potential emergency and should be followed up as soon as possible or go to the Emergency Department if any problems should occur.  Please show the CHEMOTHERAPY ALERT CARD or IMMUNOTHERAPY  ALERT CARD at check-in to the Emergency Department and triage nurse.  Should you have questions after your visit or need to cancel or reschedule your appointment, please contact [facility]  Dept: [phone]  and follow the prompts.  Office hours are 8:00 a.m. to 4:30 p.m. Monday - Friday. Please note that voicemails left after 4:00 p.m. may not be returned until the following business day.  We are closed weekends and major holidays. You have access to a nurse at all times for urgent questions. Please call the main number to the clinic Dept: [phone] and follow the prompts.   For any non-urgent questions, you may also contact your provider using MyChart. We now offer e-Visits for anyone 76 and older to request care online for non-urgent symptoms. For details visit [email or web address].   Also download the MyChart app! Go to the app store, search "MyChart", open the app, select [place], and log in with your MyChart username and password.  Bevacizumab Injection What is this medication? BEVACIZUMAB (be va SIZ yoo mab) treats some types of cancer. It works by blocking a protein that causes cancer cells to grow and multiply. This helps to slow or stop the spread of cancer cells. It is a monoclonal antibody. This medicine may be used for other purposes; ask your health care provider or pharmacist if you have questions. COMMON BRAND NAME(S): Alymsys, Avastin, MVASI, [place] Jack What should I tell my care team before I take this medication? They need to know if you have any of these  conditions: Blood clots Coughing up blood Having or recent surgery Heart failure High blood pressure History of a connection between 2 or more body parts that do not usually connect (fistula) History of a tear in your stomach or intestines Protein in your urine An unusual or allergic reaction to bevacizumab, other medications, foods, dyes, or  preservatives Pregnant or trying to get pregnant Breast-feeding How should I use this medication? This medication is injected into a vein. It is given by your care team in a hospital or clinic setting. Talk to your care team the use of this medication in children. Special care may be needed. Overdosage: If you think you have taken too much of this medicine contact a poison control center or emergency room at once. NOTE: This medicine is only for you. Do not share this medicine with others. What if I miss a dose? Keep appointments for follow-up doses. It is important not to miss your dose. Call your care team if you are unable to keep an appointment. What may interact with this medication? Interactions are not expected. This list may not describe all possible interactions. Give your health care provider a list of all the medicines, herbs, non-prescription drugs, or dietary supplements you use. Also tell them if you smoke, drink alcohol, or use illegal drugs. Some items may interact with your medicine. What should I watch for while using this medication? Your condition will be monitored carefully while you are receiving this medication. You may need blood work while taking this medication. This medication may make you feel generally unwell. This is not uncommon as chemotherapy can affect healthy cells as well as cancer cells. Report any side effects. Continue your course of treatment even though you feel ill unless your care team tells you to stop. This medication may increase your risk to bruise or bleed. Call your care team if you notice any unusual bleeding. Before having surgery, talk to your care team to make sure it is ok. This medication can increase the risk of poor healing of your surgical site or wound. You will need to stop this medication for 28 days before surgery. After surgery, wait at least 28 days before restarting this medication. Make sure the surgical site or wound is healed enough  before restarting this medication. Talk to your care team if questions. Talk to your care team if you may be pregnant. Serious birth defects can occur if you take this medication during pregnancy and for 6 months after the last dose. Contraception is recommended while taking this medication and for 6 months after the last dose. Your care team can help you find the option that works for you. Do not breastfeed while taking this medication and for 6 months after the last dose. This medication can cause infertility. Talk to your care team if you are concerned about your fertility. What side effects may I notice from receiving this medication? Side effects that you should report to your care team as soon as possible: Allergic reactions--skin rash, itching, hives, swelling of the face, lips, tongue, or throat Bleeding--bloody or black, tar-like stools, vomiting blood or brown material that looks like coffee grounds, red or dark brown urine, small red or purple spots on skin, unusual bruising or bleeding Blood clot--pain, swelling, or warmth in the leg, shortness of breath, chest pain Heart attack--pain or tightness in the chest, shoulders, arms, or jaw, nausea, shortness of breath, cold or clammy skin, feeling faint or lightheaded Heart failure--shortness of breath, swelling of  the ankles, feet, or hands, sudden weight gain, unusual weakness or fatigue Increase in blood pressure Infection--fever, chills, cough, sore throat, wounds that don't heal, pain or trouble when passing urine, general feeling of discomfort or being unwell Infusion reactions--chest pain, shortness of breath or trouble breathing, feeling faint or lightheaded Kidney injury--decrease in the amount of urine, swelling of the ankles, hands, or feet Stomach pain that is severe, does not go away, or gets worse Stroke--sudden numbness or weakness of the face, arm, or leg, trouble speaking, confusion, trouble walking, loss of balance or  coordination, dizziness, severe headache, change in vision Sudden and severe headache, confusion, change in vision, seizures, which may be signs of posterior reversible encephalopathy syndrome (PRES) Side effects that usually do not require medical attention (report to your care team if they continue or are bothersome): Back pain Change in taste Diarrhea Dry skin Increased tears Nosebleed This list may not describe all possible side effects. Call your doctor for medical advice about side effects. You may report side effects to FDA at 1-800-FDA-1088. Where should I keep my medication? This medication is given in a hospital or clinic. It will not be stored at home. NOTE: This sheet is a summary. It may not cover all possible information. If you have questions about this medicine, talk to your doctor, pharmacist, or health care provider.  2024 Elsevier/Gold Standard (2021-10-28 00:00:00)

## 2023-07-04 ENCOUNTER — Other Ambulatory Visit (HOSPITAL_COMMUNITY): Payer: Self-pay

## 2023-07-04 NOTE — Telephone Encounter (Signed)
 [employer] insurance claiming they did not receive fax of medical necessity sent in on 06/29/23 despite fax conformation received on our end.   Fax resent and additionally information was emailed to [email or web address].  Ben to follow-up with insurance on status and to ensure information resent was received.

## 2023-07-12 ENCOUNTER — Other Ambulatory Visit: Payer: Self-pay | Admitting: *Deleted

## 2023-07-12 DIAGNOSIS — K219 Gastro-esophageal reflux disease without esophagitis: Secondary | ICD-10-CM

## 2023-07-12 MED ORDER — OMEPRAZOLE 40 MG PO CPDR: 40.0000 mg | DELAYED_RELEASE_CAPSULE | Freq: Every day | ORAL | 0 refills | Status: DC | PRN

## 2023-07-15 ENCOUNTER — Other Ambulatory Visit: Payer: Self-pay | Admitting: Oncology

## 2023-07-17 ENCOUNTER — Inpatient Hospital Stay: Payer: Commercial Managed Care - PPO

## 2023-07-17 ENCOUNTER — Inpatient Hospital Stay: Payer: Commercial Managed Care - PPO | Admitting: Oncology

## 2023-07-17 ENCOUNTER — Other Ambulatory Visit: Payer: Self-pay | Admitting: *Deleted

## 2023-07-17 ENCOUNTER — Inpatient Hospital Stay
Admission: RE | Admit: 2023-07-17 | Discharge: 2023-07-17 | Disposition: A | Discharge status: Home / Self Care | Payer: Self-pay | Source: Ambulatory Visit | Attending: Oncology | Admitting: Oncology

## 2023-07-17 VITALS — BP 138/90 | HR 89

## 2023-07-17 VITALS — BP 153/97 | HR 93 | Temp 97.8°F | Resp 18 | Ht 72.0 in | Wt 250.2 lb

## 2023-07-17 DIAGNOSIS — C20 Malignant neoplasm of rectum: Secondary | ICD-10-CM | POA: Diagnosis not present

## 2023-07-17 DIAGNOSIS — Z5111 Encounter for antineoplastic chemotherapy: Secondary | ICD-10-CM | POA: Diagnosis not present

## 2023-07-17 LAB — CMP (CANCER CENTER ONLY)
ALT: 33 U/L (ref 0–44)
AST: 21 U/L (ref 15–41)
Albumin: 4.3 g/dL (ref 3.5–5.0)
Alkaline Phosphatase: 111 U/L (ref 38–126)
Anion gap: 8 (ref 5–15)
BUN: 15 mg/dL (ref 8–23)
CO2: 24 mmol/L (ref 22–32)
Calcium: 9.3 mg/dL (ref 8.9–10.3)
Chloride: 103 mmol/L (ref 98–111)
Creatinine: 0.89 mg/dL (ref 0.61–1.24)
GFR, Estimated: >60 mL/min (ref >60)
Glucose, Bld: 209 mg/dL — ABNORMAL HIGH (ref 70–99)
Potassium: 4.2 mmol/L (ref 3.5–5.1)
Sodium: 135 mmol/L (ref 135–145)
Total Bilirubin: 0.7 mg/dL (ref 0.0–1.2)
Total Protein: 6.9 g/dL (ref 6.5–8.1)

## 2023-07-17 LAB — CBC WITH DIFFERENTIAL (CANCER CENTER ONLY)
Abs Immature Granulocytes: 0.01 10*3/uL (ref 0.00–0.07)
Basophils Absolute: 0.1 10*3/uL (ref 0.0–0.1)
Basophils Relative: 2 %
Eosinophils Absolute: 0.1 10*3/uL (ref 0.0–0.5)
Eosinophils Relative: 3 %
HCT: 42.5 % (ref 39.0–52.0)
Hemoglobin: 13.9 g/dL (ref 13.0–17.0)
Immature Granulocytes: 0 %
Lymphocytes Relative: 15 %
Lymphs Abs: 0.5 10*3/uL — ABNORMAL LOW (ref 0.7–4.0)
MCH: 27.8 pg (ref 26.0–34.0)
MCHC: 32.7 g/dL (ref 30.0–36.0)
MCV: 85 fL (ref 80.0–100.0)
Monocytes Absolute: 0.4 10*3/uL (ref 0.1–1.0)
Monocytes Relative: 10 %
Neutro Abs: 2.5 10*3/uL (ref 1.7–7.7)
Neutrophils Relative %: 70 %
Platelet Count: 160 10*3/uL (ref 150–400)
RBC: 5 MIL/uL (ref 4.22–5.81)
RDW: 18 % — ABNORMAL HIGH (ref 11.5–15.5)
WBC Count: 3.5 10*3/uL — ABNORMAL LOW (ref 4.0–10.5)
nRBC: 0 % (ref 0.0–0.2)

## 2023-07-17 MED ORDER — BEVACIZUMAB-AWWB CHEMO INJECTION 100 MG/4ML
5.0000 mg/kg | Freq: Once | INTRAVENOUS | Status: AC
  Administered 2023-07-17: 600 mg via INTRAVENOUS
  Filled 2023-07-17: qty 16

## 2023-07-17 MED ORDER — HEPARIN SOD (PORK) LOCK FLUSH 100 UNIT/ML IV SOLN
500.0000 [IU] | Freq: Once | INTRAVENOUS | Status: AC | PRN
  Administered 2023-07-17: 500 [IU]

## 2023-07-17 MED ORDER — SODIUM CHLORIDE 0.9 % IV SOLN: INTRAVENOUS | Status: DC

## 2023-07-17 MED ORDER — OXYCODONE-ACETAMINOPHEN 5-325 MG PO TABS: 1.0000 | ORAL_TABLET | ORAL | 0 refills | Status: DC | PRN

## 2023-07-17 MED ORDER — SODIUM CHLORIDE 0.9% FLUSH
10.0000 mL | INTRAVENOUS | Status: DC | PRN
  Administered 2023-07-17: 10 mL

## 2023-07-17 NOTE — Patient Instructions (Signed)
[facility]   Discharge Instructions: Thank you for choosing [facility] to provide your oncology and hematology care.   If you have a lab appointment with the Cancer Center, please go directly to the Cancer Center and check in at the registration area.   Wear comfortable clothing and clothing appropriate for easy access to any Portacath or PICC line.   We strive to give you quality time with your provider. You may need to reschedule your appointment if you arrive late (15 or more minutes).  Arriving late affects you and other patients whose appointments are after yours.  Also, if you miss three or more appointments without notifying the office, you may be dismissed from the clinic at the provider's discretion.      For prescription refill requests, have your pharmacy contact our office and allow 72 hours for refills to be completed.    Today you received the following chemotherapy and/or immunotherapy agents AVASTIN       To help prevent nausea and vomiting after your treatment, we encourage you to take your nausea medication as directed.  BELOW ARE SYMPTOMS THAT SHOULD BE REPORTED IMMEDIATELY: *FEVER GREATER THAN 100.4 F (38 C) OR HIGHER *CHILLS OR SWEATING *NAUSEA AND VOMITING THAT IS NOT CONTROLLED WITH YOUR NAUSEA MEDICATION *UNUSUAL SHORTNESS OF BREATH *UNUSUAL BRUISING OR BLEEDING *URINARY PROBLEMS (pain or burning when urinating, or frequent urination) *BOWEL PROBLEMS (unusual diarrhea, constipation, pain near the anus) TENDERNESS IN MOUTH AND THROAT WITH OR WITHOUT PRESENCE OF ULCERS (sore throat, sores in mouth, or a toothache) UNUSUAL RASH, SWELLING OR PAIN  UNUSUAL VAGINAL DISCHARGE OR ITCHING   Items with * indicate a potential emergency and should be followed up as soon as possible or go to the Emergency Department if any problems should occur.  Please show the CHEMOTHERAPY ALERT CARD or IMMUNOTHERAPY  ALERT CARD at check-in to the Emergency Department and triage nurse.  Should you have questions after your visit or need to cancel or reschedule your appointment, please contact [facility]  Dept: [phone]  and follow the prompts.  Office hours are 8:00 a.m. to 4:30 p.m. Monday - Friday. Please note that voicemails left after 4:00 p.m. may not be returned until the following business day.  We are closed weekends and major holidays. You have access to a nurse at all times for urgent questions. Please call the main number to the clinic Dept: [phone] and follow the prompts.   For any non-urgent questions, you may also contact your provider using MyChart. We now offer e-Visits for anyone 79 and older to request care online for non-urgent symptoms. For details visit [email or web address].   Also download the MyChart app! Go to the app store, search "MyChart", open the app, select [place], and log in with your MyChart username and password.  Bevacizumab Injection What is this medication? BEVACIZUMAB (be va SIZ yoo mab) treats some types of cancer. It works by blocking a protein that causes cancer cells to grow and multiply. This helps to slow or stop the spread of cancer cells. It is a monoclonal antibody. This medicine may be used for other purposes; ask your health care provider or pharmacist if you have questions. COMMON BRAND NAME(S): Alymsys, Avastin, MVASI, [place] What should I tell my care team before I take this medication? They need to know if you have any  of these conditions: Blood clots Coughing up blood Having or recent surgery Heart failure High blood pressure History of a connection between 2 or more body parts that do not usually connect (fistula) History of a tear in your stomach or intestines Protein in your urine An unusual or allergic reaction to bevacizumab, other medications, foods, dyes, or  preservatives Pregnant or trying to get pregnant Breast-feeding How should I use this medication? This medication is injected into a vein. It is given by your care team in a hospital or clinic setting. Talk to your care team the use of this medication in children. Special care may be needed. Overdosage: If you think you have taken too much of this medicine contact a poison control center or emergency room at once. NOTE: This medicine is only for you. Do not share this medicine with others. What if I miss a dose? Keep appointments for follow-up doses. It is important not to miss your dose. Call your care team if you are unable to keep an appointment. What may interact with this medication? Interactions are not expected. This list may not describe all possible interactions. Give your health care provider a list of all the medicines, herbs, non-prescription drugs, or dietary supplements you use. Also tell them if you smoke, drink alcohol, or use illegal drugs. Some items may interact with your medicine. What should I watch for while using this medication? Your condition will be monitored carefully while you are receiving this medication. You may need blood work while taking this medication. This medication may make you feel generally unwell. This is not uncommon as chemotherapy can affect healthy cells as well as cancer cells. Report any side effects. Continue your course of treatment even though you feel ill unless your care team tells you to stop. This medication may increase your risk to bruise or bleed. Call your care team if you notice any unusual bleeding. Before having surgery, talk to your care team to make sure it is ok. This medication can increase the risk of poor healing of your surgical site or wound. You will need to stop this medication for 28 days before surgery. After surgery, wait at least 28 days before restarting this medication. Make sure the surgical site or wound is healed enough  before restarting this medication. Talk to your care team if questions. Talk to your care team if you may be pregnant. Serious birth defects can occur if you take this medication during pregnancy and for 6 months after the last dose. Contraception is recommended while taking this medication and for 6 months after the last dose. Your care team can help you find the option that works for you. Do not breastfeed while taking this medication and for 6 months after the last dose. This medication can cause infertility. Talk to your care team if you are concerned about your fertility. What side effects may I notice from receiving this medication? Side effects that you should report to your care team as soon as possible: Allergic reactions--skin rash, itching, hives, swelling of the face, lips, tongue, or throat Bleeding--bloody or black, tar-like stools, vomiting blood or brown material that looks like coffee grounds, red or dark brown urine, small red or purple spots on skin, unusual bruising or bleeding Blood clot--pain, swelling, or warmth in the leg, shortness of breath, chest pain Heart attack--pain or tightness in the chest, shoulders, arms, or jaw, nausea, shortness of breath, cold or clammy skin, feeling faint or lightheaded Heart failure--shortness of breath,  swelling of the ankles, feet, or hands, sudden weight gain, unusual weakness or fatigue Increase in blood pressure Infection--fever, chills, cough, sore throat, wounds that don't heal, pain or trouble when passing urine, general feeling of discomfort or being unwell Infusion reactions--chest pain, shortness of breath or trouble breathing, feeling faint or lightheaded Kidney injury--decrease in the amount of urine, swelling of the ankles, hands, or feet Stomach pain that is severe, does not go away, or gets worse Stroke--sudden numbness or weakness of the face, arm, or leg, trouble speaking, confusion, trouble walking, loss of balance or  coordination, dizziness, severe headache, change in vision Sudden and severe headache, confusion, change in vision, seizures, which may be signs of posterior reversible encephalopathy syndrome (PRES) Side effects that usually do not require medical attention (report to your care team if they continue or are bothersome): Back pain Change in taste Diarrhea Dry skin Increased tears Nosebleed This list may not describe all possible side effects. Call your doctor for medical advice about side effects. You may report side effects to FDA at 1-800-FDA-1088. Where should I keep my medication? This medication is given in a hospital or clinic. It will not be stored at home. NOTE: This sheet is a summary. It may not cover all possible information. If you have questions about this medicine, talk to your doctor, pharmacist, or health care provider.  2024 Elsevier/Gold Standard (2021-10-28 00:00:00)

## 2023-07-17 NOTE — Progress Notes (Signed)
Patient seen by Dr. Thornton Papas today  Vitals are within treatment parameters:Yes   Labs are within treatment parameters: Yes   Treatment plan has been signed: Yes   Per physician team, Patient is ready for treatment and there are NO modifications to the treatment plan.
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
## 2023-07-25 ENCOUNTER — Other Ambulatory Visit: Payer: Self-pay | Admitting: *Deleted

## 2023-07-25 DIAGNOSIS — E1122 Type 2 diabetes mellitus with diabetic chronic kidney disease: Secondary | ICD-10-CM

## 2023-07-25 MED ORDER — EMPAGLIFLOZIN 25 MG PO TABS: 25.0000 mg | ORAL_TABLET | Freq: Every day | ORAL | 0 refills | Status: DC

## 2023-07-28 ENCOUNTER — Other Ambulatory Visit: Payer: Self-pay | Admitting: Oncology

## 2023-07-28 DIAGNOSIS — C20 Malignant neoplasm of rectum: Secondary | ICD-10-CM

## 2023-07-30 ENCOUNTER — Other Ambulatory Visit: Payer: Self-pay | Admitting: *Deleted

## 2023-07-30 ENCOUNTER — Inpatient Hospital Stay: Payer: Commercial Managed Care - PPO

## 2023-07-30 ENCOUNTER — Inpatient Hospital Stay: Payer: Commercial Managed Care - PPO | Attending: Oncology

## 2023-07-30 ENCOUNTER — Encounter: Payer: Self-pay | Admitting: Oncology

## 2023-07-30 ENCOUNTER — Inpatient Hospital Stay: Payer: Commercial Managed Care - PPO | Admitting: Oncology

## 2023-07-30 VITALS — BP 122/86 | HR 98

## 2023-07-30 DIAGNOSIS — M549 Dorsalgia, unspecified: Secondary | ICD-10-CM | POA: Diagnosis not present

## 2023-07-30 DIAGNOSIS — C20 Malignant neoplasm of rectum: Secondary | ICD-10-CM | POA: Diagnosis not present

## 2023-07-30 DIAGNOSIS — C19 Malignant neoplasm of rectosigmoid junction: Secondary | ICD-10-CM | POA: Diagnosis not present

## 2023-07-30 DIAGNOSIS — Z5111 Encounter for antineoplastic chemotherapy: Secondary | ICD-10-CM | POA: Diagnosis present

## 2023-07-30 DIAGNOSIS — R2231 Localized swelling, mass and lump, right upper limb: Secondary | ICD-10-CM | POA: Insufficient documentation

## 2023-07-30 DIAGNOSIS — C787 Secondary malignant neoplasm of liver and intrahepatic bile duct: Secondary | ICD-10-CM | POA: Diagnosis not present

## 2023-07-30 DIAGNOSIS — Z800 Family history of malignant neoplasm of digestive organs: Secondary | ICD-10-CM | POA: Insufficient documentation

## 2023-07-30 DIAGNOSIS — Z803 Family history of malignant neoplasm of breast: Secondary | ICD-10-CM | POA: Diagnosis not present

## 2023-07-30 LAB — CMP (CANCER CENTER ONLY)
ALT: 30 U/L (ref 0–44)
AST: 24 U/L (ref 15–41)
Albumin: 4.1 g/dL (ref 3.5–5.0)
Alkaline Phosphatase: 110 U/L (ref 38–126)
Anion gap: 11 (ref 5–15)
BUN: 15 mg/dL (ref 8–23)
CO2: 21 mmol/L — ABNORMAL LOW (ref 22–32)
Calcium: 9.4 mg/dL (ref 8.9–10.3)
Chloride: 105 mmol/L (ref 98–111)
Creatinine: 0.95 mg/dL (ref 0.61–1.24)
GFR, Estimated: >60 mL/min (ref >60)
Glucose, Bld: 217 mg/dL — ABNORMAL HIGH (ref 70–99)
Potassium: 4.6 mmol/L (ref 3.5–5.1)
Sodium: 137 mmol/L (ref 135–145)
Total Bilirubin: 0.6 mg/dL (ref 0.0–1.2)
Total Protein: 7.5 g/dL (ref 6.5–8.1)

## 2023-07-30 LAB — CBC WITH DIFFERENTIAL (CANCER CENTER ONLY)
Abs Immature Granulocytes: 0.01 10*3/uL (ref 0.00–0.07)
Basophils Absolute: 0.1 10*3/uL (ref 0.0–0.1)
Basophils Relative: 1 %
Eosinophils Absolute: 0.2 10*3/uL (ref 0.0–0.5)
Eosinophils Relative: 3 %
HCT: 42.7 % (ref 39.0–52.0)
Hemoglobin: 13.9 g/dL (ref 13.0–17.0)
Immature Granulocytes: 0 %
Lymphocytes Relative: 12 %
Lymphs Abs: 0.6 10*3/uL — ABNORMAL LOW (ref 0.7–4.0)
MCH: 28.2 pg (ref 26.0–34.0)
MCHC: 32.6 g/dL (ref 30.0–36.0)
MCV: 86.6 fL (ref 80.0–100.0)
Monocytes Absolute: 0.3 10*3/uL (ref 0.1–1.0)
Monocytes Relative: 6 %
Neutro Abs: 3.9 10*3/uL (ref 1.7–7.7)
Neutrophils Relative %: 78 %
Platelet Count: 200 10*3/uL (ref 150–400)
RBC: 4.93 MIL/uL (ref 4.22–5.81)
RDW: 18.7 % — ABNORMAL HIGH (ref 11.5–15.5)
WBC Count: 5 10*3/uL (ref 4.0–10.5)
nRBC: 0 % (ref 0.0–0.2)

## 2023-07-30 LAB — TOTAL PROTEIN, URINE DIPSTICK: Protein, ur: NEGATIVE mg/dL

## 2023-07-30 LAB — CEA (ACCESS): CEA (CHCC): 29.54 ng/mL — ABNORMAL HIGH (ref 0.00–5.00)

## 2023-07-30 MED ORDER — AMOXICILLIN 500 MG PO CAPS: 500.0000 mg | ORAL_CAPSULE | Freq: Three times a day (TID) | ORAL | 0 refills | Status: DC

## 2023-07-30 MED ORDER — ONDANSETRON HCL 8 MG PO TABS: 8.0000 mg | ORAL_TABLET | Freq: Three times a day (TID) | ORAL | 3 refills | Status: DC | PRN

## 2023-07-30 MED ORDER — SODIUM CHLORIDE 0.9 % IV SOLN
INTRAVENOUS | Status: DC
Start: 2023-07-30 — End: 2023-07-30

## 2023-07-30 MED ORDER — SODIUM CHLORIDE 0.9% FLUSH
10.0000 mL | INTRAVENOUS | Status: DC | PRN
  Administered 2023-07-30: 10 mL

## 2023-07-30 MED ORDER — HEPARIN SOD (PORK) LOCK FLUSH 100 UNIT/ML IV SOLN
500.0000 [IU] | Freq: Once | INTRAVENOUS | Status: AC | PRN
  Administered 2023-07-30: 500 [IU]

## 2023-07-30 MED ORDER — BEVACIZUMAB-AWWB CHEMO INJECTION 400 MG/16ML
5.0000 mg/kg | Freq: Once | INTRAVENOUS | Status: AC
  Administered 2023-07-30: 600 mg via INTRAVENOUS
  Filled 2023-07-30: qty 16

## 2023-07-30 MED ORDER — LORAZEPAM 0.5 MG PO TABS: 0.5000 mg | ORAL_TABLET | Freq: Three times a day (TID) | ORAL | 0 refills | Status: DC | PRN

## 2023-07-30 MED ORDER — ONDANSETRON 8 MG PO TBDP: 8.0000 mg | ORAL_TABLET | Freq: Three times a day (TID) | ORAL | 3 refills | Status: DC | PRN

## 2023-07-30 NOTE — Progress Notes (Signed)
 Patient seen by Dr. Thornton Papas today  Vitals are within treatment parameters:Yes   Labs are within treatment parameters: Yes   Treatment plan has been signed: Yes   Per physician team, Patient is ready for treatment and there are NO modifications to the treatment plan.
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
## 2023-07-30 NOTE — Patient Instructions (Signed)
 [facility] DE[facility] OF [facility]   Discharge Instructions: Thank you for choosing [facility] to provide your oncology and hematology care.   If you have a lab appointment with the Cancer Center, please go directly to the Cancer Center and check in at the registration area.   Wear comfortable clothing and clothing appropriate for easy access to any Portacath or PICC line.   We strive to give you quality time with your provider. You may need to reschedule your appointment if you arrive late (15 or more minutes).  Arriving late affects you and other patients whose appointments are after yours.  Also, if you miss three or more appointments without notifying the office, you may be dismissed from the clinic at the provider's discretion.      For prescription refill requests, have your pharmacy contact our office and allow 72 hours for refills to be completed.    Today you received the following chemotherapy and/or immunotherapy agents AVASTIN       To help prevent nausea and vomiting after your treatment, we encourage you to take your nausea medication as directed.  BELOW ARE SYMPTOMS THAT SHOULD BE REPORTED IMMEDIATELY: *FEVER GREATER THAN 100.4 F (38 C) OR HIGHER *CHILLS OR SWEATING *NAUSEA AND VOMITING THAT IS NOT CONTROLLED WITH YOUR NAUSEA MEDICATION *UNUSUAL SHORTNESS OF BREATH *UNUSUAL BRUISING OR BLEEDING *URINARY PROBLEMS (pain or burning when urinating, or frequent urination) *BOWEL PROBLEMS (unusual diarrhea, constipation, pain near the anus) TENDERNESS IN MOUTH AND THROAT WITH OR WITHOUT PRESENCE OF ULCERS (sore throat, sores in mouth, or a toothache) UNUSUAL RASH, SWELLING OR PAIN  UNUSUAL VAGINAL DISCHARGE OR ITCHING   Items with * indicate a potential emergency and should be followed up as soon as possible or go to the Emergency Department if any problems should occur.  Please show the CHEMOTHERAPY ALERT CARD or IMMUNOTHERAPY  ALERT CARD at check-in to the Emergency Department and triage nurse.  Should you have questions after your visit or need to cancel or reschedule your appointment, please contact [facility]  Dept: [phone]  and follow the prompts.  Office hours are 8:00 a.m. to 4:30 p.m. Monday - Friday. Please note that voicemails left after 4:00 p.m. may not be returned until the following business day.  We are closed weekends and major holidays. You have access to a nurse at all times for urgent questions. Please call the main number to the clinic Dept: [phone] and follow the prompts.   For any non-urgent questions, you may also contact your provider using MyChart. We now offer e-Visits for anyone 79 and older to request care online for non-urgent symptoms. For details visit [email or web address].   Also download the MyChart app! Go to the app store, search "MyChart", open the app, select [place], and log in with your MyChart username and password.  Bevacizumab Injection What is this medication? BEVACIZUMAB (be va SIZ yoo mab) treats some types of cancer. It works by blocking a protein that causes cancer cells to grow and multiply. This helps to slow or stop the spread of cancer cells. It is a monoclonal antibody. This medicine may be used for other purposes; ask your health care provider or pharmacist if you have questions. COMMON BRAND NAME(S): Alymsys, Avastin, MVASI, Rosaland Lao What should I tell my care team before I take this medication? They need to know if you have any  of these conditions: Blood clots Coughing up blood Having or recent surgery Heart failure High blood pressure History of a connection between 2 or more body parts that do not usually connect (fistula) History of a tear in your stomach or intestines Protein in your urine An unusual or allergic reaction to bevacizumab, other medications, foods, dyes, or  preservatives Pregnant or trying to get pregnant Breast-feeding How should I use this medication? This medication is injected into a vein. It is given by your care team in a hospital or clinic setting. Talk to your care team the use of this medication in children. Special care may be needed. Overdosage: If you think you have taken too much of this medicine contact a poison control center or emergency room at once. NOTE: This medicine is only for you. Do not share this medicine with others. What if I miss a dose? Keep appointments for follow-up doses. It is important not to miss your dose. Call your care team if you are unable to keep an appointment. What may interact with this medication? Interactions are not expected. This list may not describe all possible interactions. Give your health care provider a list of all the medicines, herbs, non-prescription drugs, or dietary supplements you use. Also tell them if you smoke, drink alcohol, or use illegal drugs. Some items may interact with your medicine. What should I watch for while using this medication? Your condition will be monitored carefully while you are receiving this medication. You may need blood work while taking this medication. This medication may make you feel generally unwell. This is not uncommon as chemotherapy can affect healthy cells as well as cancer cells. Report any side effects. Continue your course of treatment even though you feel ill unless your care team tells you to stop. This medication may increase your risk to bruise or bleed. Call your care team if you notice any unusual bleeding. Before having surgery, talk to your care team to make sure it is ok. This medication can increase the risk of poor healing of your surgical site or wound. You will need to stop this medication for 28 days before surgery. After surgery, wait at least 28 days before restarting this medication. Make sure the surgical site or wound is healed enough  before restarting this medication. Talk to your care team if questions. Talk to your care team if you may be pregnant. Serious birth defects can occur if you take this medication during pregnancy and for 6 months after the last dose. Contraception is recommended while taking this medication and for 6 months after the last dose. Your care team can help you find the option that works for you. Do not breastfeed while taking this medication and for 6 months after the last dose. This medication can cause infertility. Talk to your care team if you are concerned about your fertility. What side effects may I notice from receiving this medication? Side effects that you should report to your care team as soon as possible: Allergic reactions--skin rash, itching, hives, swelling of the face, lips, tongue, or throat Bleeding--bloody or black, tar-like stools, vomiting blood or brown material that looks like coffee grounds, red or dark brown urine, small red or purple spots on skin, unusual bruising or bleeding Blood clot--pain, swelling, or warmth in the leg, shortness of breath, chest pain Heart attack--pain or tightness in the chest, shoulders, arms, or jaw, nausea, shortness of breath, cold or clammy skin, feeling faint or lightheaded Heart failure--shortness of breath,  swelling of the ankles, feet, or hands, sudden weight gain, unusual weakness or fatigue Increase in blood pressure Infection--fever, chills, cough, sore throat, wounds that don't heal, pain or trouble when passing urine, general feeling of discomfort or being unwell Infusion reactions--chest pain, shortness of breath or trouble breathing, feeling faint or lightheaded Kidney injury--decrease in the amount of urine, swelling of the ankles, hands, or feet Stomach pain that is severe, does not go away, or gets worse Stroke--sudden numbness or weakness of the face, arm, or leg, trouble speaking, confusion, trouble walking, loss of balance or  coordination, dizziness, severe headache, change in vision Sudden and severe headache, confusion, change in vision, seizures, which may be signs of posterior reversible encephalopathy syndrome (PRES) Side effects that usually do not require medical attention (report to your care team if they continue or are bothersome): Back pain Change in taste Diarrhea Dry skin Increased tears Nosebleed This list may not describe all possible side effects. Call your doctor for medical advice about side effects. You may report side effects to FDA at 1-800-FDA-1088. Where should I keep my medication? This medication is given in a hospital or clinic. It will not be stored at home. NOTE: This sheet is a summary. It may not cover all possible information. If you have questions about this medicine, talk to your doctor, pharmacist, or health care provider.  2024 Elsevier/Gold Standard (2021-10-28 00:00:00)

## 2023-08-13 ENCOUNTER — Other Ambulatory Visit: Payer: Self-pay | Admitting: *Deleted

## 2023-08-13 ENCOUNTER — Inpatient Hospital Stay: Payer: Commercial Managed Care - PPO

## 2023-08-13 ENCOUNTER — Inpatient Hospital Stay: Payer: Commercial Managed Care - PPO | Admitting: Oncology

## 2023-08-13 ENCOUNTER — Encounter: Payer: Self-pay | Admitting: Oncology

## 2023-08-13 ENCOUNTER — Inpatient Hospital Stay
Admit: 2023-08-13 | Discharge: 2023-08-13 | Disposition: A | Discharge status: Home / Self Care | Payer: Self-pay | Attending: Oncology | Admitting: Oncology

## 2023-08-13 VITALS — BP 133/90 | HR 100 | Temp 98.1°F | Resp 18 | Ht 72.0 in | Wt 252.1 lb

## 2023-08-13 VITALS — BP 140/79 | HR 79 | Resp 18

## 2023-08-13 DIAGNOSIS — C20 Malignant neoplasm of rectum: Secondary | ICD-10-CM

## 2023-08-13 DIAGNOSIS — Z5111 Encounter for antineoplastic chemotherapy: Secondary | ICD-10-CM | POA: Diagnosis not present

## 2023-08-13 LAB — CBC WITH DIFFERENTIAL (CANCER CENTER ONLY)
Abs Immature Granulocytes: 0.01 10*3/uL (ref 0.00–0.07)
Basophils Absolute: 0.1 10*3/uL (ref 0.0–0.1)
Basophils Relative: 2 %
Eosinophils Absolute: 0.1 10*3/uL (ref 0.0–0.5)
Eosinophils Relative: 5 %
HCT: 42.2 % (ref 39.0–52.0)
Hemoglobin: 13.6 g/dL (ref 13.0–17.0)
Immature Granulocytes: 0 %
Lymphocytes Relative: 14 %
Lymphs Abs: 0.4 10*3/uL — ABNORMAL LOW (ref 0.7–4.0)
MCH: 28 pg (ref 26.0–34.0)
MCHC: 32.2 g/dL (ref 30.0–36.0)
MCV: 86.8 fL (ref 80.0–100.0)
Monocytes Absolute: 0.4 10*3/uL (ref 0.1–1.0)
Monocytes Relative: 12 %
Neutro Abs: 2.1 10*3/uL (ref 1.7–7.7)
Neutrophils Relative %: 67 %
Platelet Count: 186 10*3/uL (ref 150–400)
RBC: 4.86 MIL/uL (ref 4.22–5.81)
RDW: 18.6 % — ABNORMAL HIGH (ref 11.5–15.5)
WBC Count: 3.1 10*3/uL — ABNORMAL LOW (ref 4.0–10.5)
nRBC: 0 % (ref 0.0–0.2)

## 2023-08-13 MED ORDER — SODIUM CHLORIDE 0.9 % IV SOLN
5.0000 mg/kg | Freq: Once | INTRAVENOUS | Status: AC
  Administered 2023-08-13: 600 mg via INTRAVENOUS
  Filled 2023-08-13: qty 16

## 2023-08-13 MED ORDER — SODIUM CHLORIDE 0.9 % IV SOLN: INTRAVENOUS | Status: DC

## 2023-08-13 MED ORDER — HEPARIN SOD (PORK) LOCK FLUSH 100 UNIT/ML IV SOLN
500.0000 [IU] | Freq: Once | INTRAVENOUS | Status: AC | PRN
Start: 2023-08-13 — End: 2023-08-13
  Administered 2023-08-13: 500 [IU]

## 2023-08-13 MED ORDER — SODIUM CHLORIDE 0.9% FLUSH
10.0000 mL | INTRAVENOUS | Status: DC | PRN
  Administered 2023-08-13: 10 mL

## 2023-08-13 NOTE — Patient Instructions (Signed)
[facility]   Discharge Instructions: Thank you for choosing [facility] to provide your oncology and hematology care.   If you have a lab appointment with the Cancer Center, please go directly to the Cancer Center and check in at the registration area.   Wear comfortable clothing and clothing appropriate for easy access to any Portacath or PICC line.   We strive to give you quality time with your provider. You may need to reschedule your appointment if you arrive late (15 or more minutes).  Arriving late affects you and other patients whose appointments are after yours.  Also, if you miss three or more appointments without notifying the office, you may be dismissed from the clinic at the provider's discretion.      For prescription refill requests, have your pharmacy contact our office and allow 72 hours for refills to be completed.    Today you received the following chemotherapy and/or immunotherapy agents Bevacizumab-awwb (MVASI).      To help prevent nausea and vomiting after your treatment, we encourage you to take your nausea medication as directed.  BELOW ARE SYMPTOMS THAT SHOULD BE REPORTED IMMEDIATELY: *FEVER GREATER THAN 100.4 F (38 C) OR HIGHER *CHILLS OR SWEATING *NAUSEA AND VOMITING THAT IS NOT CONTROLLED WITH YOUR NAUSEA MEDICATION *UNUSUAL SHORTNESS OF BREATH *UNUSUAL BRUISING OR BLEEDING *URINARY PROBLEMS (pain or burning when urinating, or frequent urination) *BOWEL PROBLEMS (unusual diarrhea, constipation, pain near the anus) TENDERNESS IN MOUTH AND THROAT WITH OR WITHOUT PRESENCE OF ULCERS (sore throat, sores in mouth, or a toothache) UNUSUAL RASH, SWELLING OR PAIN  UNUSUAL VAGINAL DISCHARGE OR ITCHING   Items with * indicate a potential emergency and should be followed up as soon as possible or go to the Emergency Department if any problems should occur.  Please show the CHEMOTHERAPY ALERT CARD  or IMMUNOTHERAPY ALERT CARD at check-in to the Emergency Department and triage nurse.  Should you have questions after your visit or need to cancel or reschedule your appointment, please contact [facility]  Dept: [phone]  and follow the prompts.  Office hours are 8:00 a.m. to 4:30 p.m. Monday - Friday. Please note that voicemails left after 4:00 p.m. may not be returned until the following business day.  We are closed weekends and major holidays. You have access to a nurse at all times for urgent questions. Please call the main number to the clinic Dept: [phone] and follow the prompts.   For any non-urgent questions, you may also contact your provider using MyChart. We now offer e-Visits for anyone 76 and older to request care online for non-urgent symptoms. For details visit [email or web address].   Also download the MyChart app! Go to the app store, search "MyChart", open the app, select [place], and log in with your MyChart username and password.  Bevacizumab Injection What is this medication? BEVACIZUMAB (be va SIZ yoo mab) treats some types of cancer. It works by blocking a protein that causes cancer cells to grow and multiply. This helps to slow or stop the spread of cancer cells. It is a monoclonal antibody. This medicine may be used for other purposes; ask your health care provider or pharmacist if you have questions. COMMON BRAND NAME(S): Alymsys, Avastin, MVASI, Rosaland Lao What should I tell my care team before I take this medication? They need to know if you have any  of these conditions: Blood clots Coughing up blood Having or recent surgery Heart failure High blood pressure History of a connection between 2 or more body parts that do not usually connect (fistula) History of a tear in your stomach or intestines Protein in your urine An unusual or allergic reaction to bevacizumab, other medications,  foods, dyes, or preservatives Pregnant or trying to get pregnant Breast-feeding How should I use this medication? This medication is injected into a vein. It is given by your care team in a hospital or clinic setting. Talk to your care team the use of this medication in children. Special care may be needed. Overdosage: If you think you have taken too much of this medicine contact a poison control center or emergency room at once. NOTE: This medicine is only for you. Do not share this medicine with others. What if I miss a dose? Keep appointments for follow-up doses. It is important not to miss your dose. Call your care team if you are unable to keep an appointment. What may interact with this medication? Interactions are not expected. This list may not describe all possible interactions. Give your health care provider a list of all the medicines, herbs, non-prescription drugs, or dietary supplements you use. Also tell them if you smoke, drink alcohol, or use illegal drugs. Some items may interact with your medicine. What should I watch for while using this medication? Your condition will be monitored carefully while you are receiving this medication. You may need blood work while taking this medication. This medication may make you feel generally unwell. This is not uncommon as chemotherapy can affect healthy cells as well as cancer cells. Report any side effects. Continue your course of treatment even though you feel ill unless your care team tells you to stop. This medication may increase your risk to bruise or bleed. Call your care team if you notice any unusual bleeding. Before having surgery, talk to your care team to make sure it is ok. This medication can increase the risk of poor healing of your surgical site or wound. You will need to stop this medication for 28 days before surgery. After surgery, wait at least 28 days before restarting this medication. Make sure the surgical site or wound  is healed enough before restarting this medication. Talk to your care team if questions. Talk to your care team if you may be pregnant. Serious birth defects can occur if you take this medication during pregnancy and for 6 months after the last dose. Contraception is recommended while taking this medication and for 6 months after the last dose. Your care team can help you find the option that works for you. Do not breastfeed while taking this medication and for 6 months after the last dose. This medication can cause infertility. Talk to your care team if you are concerned about your fertility. What side effects may I notice from receiving this medication? Side effects that you should report to your care team as soon as possible: Allergic reactions--skin rash, itching, hives, swelling of the face, lips, tongue, or throat Bleeding--bloody or black, tar-like stools, vomiting blood or brown material that looks like coffee grounds, red or dark brown urine, small red or purple spots on skin, unusual bruising or bleeding Blood clot--pain, swelling, or warmth in the leg, shortness of breath, chest pain Heart attack--pain or tightness in the chest, shoulders, arms, or jaw, nausea, shortness of breath, cold or clammy skin, feeling faint or lightheaded Heart failure--shortness of breath,  swelling of the ankles, feet, or hands, sudden weight gain, unusual weakness or fatigue Increase in blood pressure Infection--fever, chills, cough, sore throat, wounds that don't heal, pain or trouble when passing urine, general feeling of discomfort or being unwell Infusion reactions--chest pain, shortness of breath or trouble breathing, feeling faint or lightheaded Kidney injury--decrease in the amount of urine, swelling of the ankles, hands, or feet Stomach pain that is severe, does not go away, or gets worse Stroke--sudden numbness or weakness of the face, arm, or leg, trouble speaking, confusion, trouble walking, loss of  balance or coordination, dizziness, severe headache, change in vision Sudden and severe headache, confusion, change in vision, seizures, which may be signs of posterior reversible encephalopathy syndrome (PRES) Side effects that usually do not require medical attention (report to your care team if they continue or are bothersome): Back pain Change in taste Diarrhea Dry skin Increased tears Nosebleed This list may not describe all possible side effects. Call your doctor for medical advice about side effects. You may report side effects to FDA at 1-800-FDA-1088. Where should I keep my medication? This medication is given in a hospital or clinic. It will not be stored at home. NOTE: This sheet is a summary. It may not cover all possible information. If you have questions about this medicine, talk to your doctor, pharmacist, or health care provider.  2024 Elsevier/Gold Standard (2021-10-28 00:00:00)
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
## 2023-08-13 NOTE — Progress Notes (Signed)
Patient seen by Dr. Thornton Papas today  Vitals are within treatment parameters:Yes   Labs are within treatment parameters: Yes  Urine protein not needed this week  Treatment plan has been signed: Yes   Per physician team, Patient is ready for treatment and there are NO modifications to the treatment plan.

## 2023-08-13 NOTE — Progress Notes (Signed)
[name] made aware of CT appointment and does not need oral contrast per Dr. Truett Perna. He prefers peripheral stick for his IV contrast as well. Message sent to radiology to request his 05/28/23 MRI/CT images from [facility] be imported.

## 2023-08-22 ENCOUNTER — Other Ambulatory Visit: Payer: Self-pay | Admitting: Oncology

## 2023-08-22 DIAGNOSIS — C20 Malignant neoplasm of rectum: Secondary | ICD-10-CM

## 2023-08-26 ENCOUNTER — Other Ambulatory Visit: Payer: Self-pay | Admitting: Oncology

## 2023-08-26 DIAGNOSIS — C20 Malignant neoplasm of rectum: Secondary | ICD-10-CM

## 2023-08-27 ENCOUNTER — Inpatient Hospital Stay: Payer: Commercial Managed Care - PPO

## 2023-08-27 ENCOUNTER — Inpatient Hospital Stay: Payer: Commercial Managed Care - PPO | Attending: Oncology

## 2023-08-27 ENCOUNTER — Inpatient Hospital Stay: Payer: Commercial Managed Care - PPO | Admitting: Nurse Practitioner

## 2023-08-27 ENCOUNTER — Encounter: Payer: Self-pay | Admitting: Nurse Practitioner

## 2023-08-27 VITALS — BP 143/92 | HR 88 | Temp 97.9°F | Resp 18

## 2023-08-27 VITALS — BP 130/93 | HR 98 | Temp 98.2°F | Resp 18 | Ht 72.0 in | Wt 250.8 lb

## 2023-08-27 DIAGNOSIS — G620 Drug-induced polyneuropathy: Secondary | ICD-10-CM | POA: Insufficient documentation

## 2023-08-27 DIAGNOSIS — G8929 Other chronic pain: Secondary | ICD-10-CM | POA: Insufficient documentation

## 2023-08-27 DIAGNOSIS — M549 Dorsalgia, unspecified: Secondary | ICD-10-CM | POA: Insufficient documentation

## 2023-08-27 DIAGNOSIS — Z803 Family history of malignant neoplasm of breast: Secondary | ICD-10-CM | POA: Diagnosis not present

## 2023-08-27 DIAGNOSIS — C20 Malignant neoplasm of rectum: Secondary | ICD-10-CM

## 2023-08-27 DIAGNOSIS — C787 Secondary malignant neoplasm of liver and intrahepatic bile duct: Secondary | ICD-10-CM | POA: Diagnosis not present

## 2023-08-27 DIAGNOSIS — Z5111 Encounter for antineoplastic chemotherapy: Secondary | ICD-10-CM | POA: Diagnosis present

## 2023-08-27 DIAGNOSIS — Z800 Family history of malignant neoplasm of digestive organs: Secondary | ICD-10-CM | POA: Insufficient documentation

## 2023-08-27 LAB — CBC WITH DIFFERENTIAL (CANCER CENTER ONLY)
Abs Immature Granulocytes: 0.02 10*3/uL (ref 0.00–0.07)
Basophils Absolute: 0.1 10*3/uL (ref 0.0–0.1)
Basophils Relative: 1 %
Eosinophils Absolute: 0.1 10*3/uL (ref 0.0–0.5)
Eosinophils Relative: 2 %
HCT: 40.4 % (ref 39.0–52.0)
Hemoglobin: 13.4 g/dL (ref 13.0–17.0)
Immature Granulocytes: 1 %
Lymphocytes Relative: 12 %
Lymphs Abs: 0.5 10*3/uL — ABNORMAL LOW (ref 0.7–4.0)
MCH: 29.1 pg (ref 26.0–34.0)
MCHC: 33.2 g/dL (ref 30.0–36.0)
MCV: 87.8 fL (ref 80.0–100.0)
Monocytes Absolute: 0.2 10*3/uL (ref 0.1–1.0)
Monocytes Relative: 5 %
Neutro Abs: 3.5 10*3/uL (ref 1.7–7.7)
Neutrophils Relative %: 79 %
Platelet Count: 174 10*3/uL (ref 150–400)
RBC: 4.6 MIL/uL (ref 4.22–5.81)
RDW: 19.3 % — ABNORMAL HIGH (ref 11.5–15.5)
WBC Count: 4.4 10*3/uL (ref 4.0–10.5)
nRBC: 0.5 % — ABNORMAL HIGH (ref 0.0–0.2)

## 2023-08-27 LAB — CEA (ACCESS): CEA (CHCC): 34.15 ng/mL — ABNORMAL HIGH (ref 0.00–5.00)

## 2023-08-27 LAB — CMP (CANCER CENTER ONLY)
ALT: 34 U/L (ref 0–44)
AST: 27 U/L (ref 15–41)
Albumin: 4.3 g/dL (ref 3.5–5.0)
Alkaline Phosphatase: 84 U/L (ref 38–126)
Anion gap: 9 (ref 5–15)
BUN: 11 mg/dL (ref 8–23)
CO2: 23 mmol/L (ref 22–32)
Calcium: 9.3 mg/dL (ref 8.9–10.3)
Chloride: 105 mmol/L (ref 98–111)
Creatinine: 0.97 mg/dL (ref 0.61–1.24)
GFR, Estimated: >60 mL/min (ref >60)
Glucose, Bld: 212 mg/dL — ABNORMAL HIGH (ref 70–99)
Potassium: 4.2 mmol/L (ref 3.5–5.1)
Sodium: 137 mmol/L (ref 135–145)
Total Bilirubin: 0.7 mg/dL (ref 0.0–1.2)
Total Protein: 6.9 g/dL (ref 6.5–8.1)

## 2023-08-27 MED ORDER — HEPARIN SOD (PORK) LOCK FLUSH 100 UNIT/ML IV SOLN
500.0000 [IU] | Freq: Once | INTRAVENOUS | Status: AC | PRN
  Administered 2023-08-27: 500 [IU]

## 2023-08-27 MED ORDER — SODIUM CHLORIDE 0.9% FLUSH
10.0000 mL | INTRAVENOUS | Status: DC | PRN
  Administered 2023-08-27: 10 mL

## 2023-08-27 MED ORDER — SODIUM CHLORIDE 0.9 % IV SOLN
5.0000 mg/kg | Freq: Once | INTRAVENOUS | Status: AC
  Administered 2023-08-27: 600 mg via INTRAVENOUS
  Filled 2023-08-27: qty 16

## 2023-08-27 MED ORDER — LORAZEPAM 1 MG PO TABS: 1.0000 mg | ORAL_TABLET | Freq: Three times a day (TID) | ORAL | 0 refills | Status: DC | PRN

## 2023-08-27 MED ORDER — SODIUM CHLORIDE 0.9 % IV SOLN: INTRAVENOUS | Status: DC
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
## 2023-08-27 NOTE — Progress Notes (Signed)
 Patient seen by Lonna Cobb NP today  Vitals are within treatment parameters:No (Please specify and give further instructions.)B/P 130/93  Labs are within treatment parameters: Yes   Treatment plan has been signed: Yes   Per physician team, Patient is ready for treatment and there are NO modifications to the treatment plan.

## 2023-08-27 NOTE — Patient Instructions (Signed)
 [facility] DE[facility] OF [facility]   Discharge Instructions: Thank you for choosing [facility] to provide your oncology and hematology care.   If you have a lab appointment with the Cancer Center, please go directly to the Cancer Center and check in at the registration area.   Wear comfortable clothing and clothing appropriate for easy access to any Portacath or PICC line.   We strive to give you quality time with your provider. You may need to reschedule your appointment if you arrive late (15 or more minutes).  Arriving late affects you and other patients whose appointments are after yours.  Also, if you miss three or more appointments without notifying the office, you may be dismissed from the clinic at the provider's discretion.      For prescription refill requests, have your pharmacy contact our office and allow 72 hours for refills to be completed.    Today you received the following chemotherapy and/or immunotherapy agents AVASTIN       To help prevent nausea and vomiting after your treatment, we encourage you to take your nausea medication as directed.  BELOW ARE SYMPTOMS THAT SHOULD BE REPORTED IMMEDIATELY: *FEVER GREATER THAN 100.4 F (38 C) OR HIGHER *CHILLS OR SWEATING *NAUSEA AND VOMITING THAT IS NOT CONTROLLED WITH YOUR NAUSEA MEDICATION *UNUSUAL SHORTNESS OF BREATH *UNUSUAL BRUISING OR BLEEDING *URINARY PROBLEMS (pain or burning when urinating, or frequent urination) *BOWEL PROBLEMS (unusual diarrhea, constipation, pain near the anus) TENDERNESS IN MOUTH AND THROAT WITH OR WITHOUT PRESENCE OF ULCERS (sore throat, sores in mouth, or a toothache) UNUSUAL RASH, SWELLING OR PAIN  UNUSUAL VAGINAL DISCHARGE OR ITCHING   Items with * indicate a potential emergency and should be followed up as soon as possible or go to the Emergency Department if any problems should occur.  Please show the CHEMOTHERAPY ALERT CARD or IMMUNOTHERAPY  ALERT CARD at check-in to the Emergency Department and triage nurse.  Should you have questions after your visit or need to cancel or reschedule your appointment, please contact [facility]  Dept: [phone]  and follow the prompts.  Office hours are 8:00 a.m. to 4:30 p.m. Monday - Friday. Please note that voicemails left after 4:00 p.m. may not be returned until the following business day.  We are closed weekends and major holidays. You have access to a nurse at all times for urgent questions. Please call the main number to the clinic Dept: [phone] and follow the prompts.   For any non-urgent questions, you may also contact your provider using MyChart. We now offer e-Visits for anyone 79 and older to request care online for non-urgent symptoms. For details visit [email or web address].   Also download the MyChart app! Go to the app store, search "MyChart", open the app, select [place], and log in with your MyChart username and password.  Bevacizumab Injection What is this medication? BEVACIZUMAB (be va SIZ yoo mab) treats some types of cancer. It works by blocking a protein that causes cancer cells to grow and multiply. This helps to slow or stop the spread of cancer cells. It is a monoclonal antibody. This medicine may be used for other purposes; ask your health care provider or pharmacist if you have questions. COMMON BRAND NAME(S): Alymsys, Avastin, MVASI, Rosaland Lao What should I tell my care team before I take this medication? They need to know if you have any  of these conditions: Blood clots Coughing up blood Having or recent surgery Heart failure High blood pressure History of a connection between 2 or more body parts that do not usually connect (fistula) History of a tear in your stomach or intestines Protein in your urine An unusual or allergic reaction to bevacizumab, other medications, foods, dyes, or  preservatives Pregnant or trying to get pregnant Breast-feeding How should I use this medication? This medication is injected into a vein. It is given by your care team in a hospital or clinic setting. Talk to your care team the use of this medication in children. Special care may be needed. Overdosage: If you think you have taken too much of this medicine contact a poison control center or emergency room at once. NOTE: This medicine is only for you. Do not share this medicine with others. What if I miss a dose? Keep appointments for follow-up doses. It is important not to miss your dose. Call your care team if you are unable to keep an appointment. What may interact with this medication? Interactions are not expected. This list may not describe all possible interactions. Give your health care provider a list of all the medicines, herbs, non-prescription drugs, or dietary supplements you use. Also tell them if you smoke, drink alcohol, or use illegal drugs. Some items may interact with your medicine. What should I watch for while using this medication? Your condition will be monitored carefully while you are receiving this medication. You may need blood work while taking this medication. This medication may make you feel generally unwell. This is not uncommon as chemotherapy can affect healthy cells as well as cancer cells. Report any side effects. Continue your course of treatment even though you feel ill unless your care team tells you to stop. This medication may increase your risk to bruise or bleed. Call your care team if you notice any unusual bleeding. Before having surgery, talk to your care team to make sure it is ok. This medication can increase the risk of poor healing of your surgical site or wound. You will need to stop this medication for 28 days before surgery. After surgery, wait at least 28 days before restarting this medication. Make sure the surgical site or wound is healed enough  before restarting this medication. Talk to your care team if questions. Talk to your care team if you may be pregnant. Serious birth defects can occur if you take this medication during pregnancy and for 6 months after the last dose. Contraception is recommended while taking this medication and for 6 months after the last dose. Your care team can help you find the option that works for you. Do not breastfeed while taking this medication and for 6 months after the last dose. This medication can cause infertility. Talk to your care team if you are concerned about your fertility. What side effects may I notice from receiving this medication? Side effects that you should report to your care team as soon as possible: Allergic reactions--skin rash, itching, hives, swelling of the face, lips, tongue, or throat Bleeding--bloody or black, tar-like stools, vomiting blood or brown material that looks like coffee grounds, red or dark brown urine, small red or purple spots on skin, unusual bruising or bleeding Blood clot--pain, swelling, or warmth in the leg, shortness of breath, chest pain Heart attack--pain or tightness in the chest, shoulders, arms, or jaw, nausea, shortness of breath, cold or clammy skin, feeling faint or lightheaded Heart failure--shortness of breath,  swelling of the ankles, feet, or hands, sudden weight gain, unusual weakness or fatigue Increase in blood pressure Infection--fever, chills, cough, sore throat, wounds that don't heal, pain or trouble when passing urine, general feeling of discomfort or being unwell Infusion reactions--chest pain, shortness of breath or trouble breathing, feeling faint or lightheaded Kidney injury--decrease in the amount of urine, swelling of the ankles, hands, or feet Stomach pain that is severe, does not go away, or gets worse Stroke--sudden numbness or weakness of the face, arm, or leg, trouble speaking, confusion, trouble walking, loss of balance or  coordination, dizziness, severe headache, change in vision Sudden and severe headache, confusion, change in vision, seizures, which may be signs of posterior reversible encephalopathy syndrome (PRES) Side effects that usually do not require medical attention (report to your care team if they continue or are bothersome): Back pain Change in taste Diarrhea Dry skin Increased tears Nosebleed This list may not describe all possible side effects. Call your doctor for medical advice about side effects. You may report side effects to FDA at 1-800-FDA-1088. Where should I keep my medication? This medication is given in a hospital or clinic. It will not be stored at home. NOTE: This sheet is a summary. It may not cover all possible information. If you have questions about this medicine, talk to your doctor, pharmacist, or health care provider.  2024 Elsevier/Gold Standard (2021-10-28 00:00:00)

## 2023-08-31 ENCOUNTER — Ambulatory Visit (HOSPITAL_BASED_OUTPATIENT_CLINIC_OR_DEPARTMENT_OTHER)
Admission: RE | Admit: 2023-08-31 | Discharge: 2023-08-31 | Disposition: A | Discharge status: Home / Self Care | Payer: Commercial Managed Care - PPO | Source: Ambulatory Visit | Attending: Oncology | Admitting: Oncology

## 2023-08-31 DIAGNOSIS — C20 Malignant neoplasm of rectum: Secondary | ICD-10-CM | POA: Insufficient documentation

## 2023-08-31 MED ORDER — IOHEXOL 300 MG/ML  SOLN
100.0000 mL | Freq: Once | INTRAMUSCULAR | Status: AC | PRN
  Administered 2023-08-31: 100 mL via INTRAVENOUS

## 2023-09-05 ENCOUNTER — Inpatient Hospital Stay: Payer: Commercial Managed Care - PPO | Admitting: Oncology

## 2023-09-05 ENCOUNTER — Encounter: Payer: Self-pay | Admitting: *Deleted

## 2023-09-05 VITALS — BP 130/89 | HR 89 | Temp 98.2°F | Resp 18 | Ht 72.0 in | Wt 249.9 lb

## 2023-09-05 DIAGNOSIS — C20 Malignant neoplasm of rectum: Secondary | ICD-10-CM

## 2023-09-05 DIAGNOSIS — Z5111 Encounter for antineoplastic chemotherapy: Secondary | ICD-10-CM | POA: Diagnosis not present

## 2023-09-05 NOTE — Progress Notes (Signed)
 Called placed to Dr Carleene Cooper at [facility] for return appt and scheduled Mr Been for Tuesday 3/18 at 2 pm
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
## 2023-09-06 ENCOUNTER — Telehealth: Payer: Self-pay | Admitting: Oncology

## 2023-09-06 NOTE — Telephone Encounter (Signed)
 Spoke with patient confirming upcoming appointment

## 2023-09-07 ENCOUNTER — Other Ambulatory Visit: Payer: Self-pay | Admitting: *Deleted

## 2023-09-07 ENCOUNTER — Inpatient Hospital Stay
Admission: RE | Admit: 2023-09-07 | Discharge: 2023-09-07 | Disposition: A | Discharge status: Home / Self Care | Payer: Self-pay | Source: Ambulatory Visit | Attending: Oncology | Admitting: Oncology

## 2023-09-07 ENCOUNTER — Other Ambulatory Visit: Payer: Self-pay

## 2023-09-07 DIAGNOSIS — C20 Malignant neoplasm of rectum: Secondary | ICD-10-CM

## 2023-09-07 NOTE — Progress Notes (Signed)
 Re-entered request to import [facility] CT images from 05/27/2024. Asked radiology to also request a CD be sent asap if they are still behind in sharing images.

## 2023-09-13 ENCOUNTER — Encounter: Payer: Self-pay | Admitting: Family

## 2023-09-13 ENCOUNTER — Ambulatory Visit (INDEPENDENT_AMBULATORY_CARE_PROVIDER_SITE_OTHER): Payer: Commercial Managed Care - PPO | Admitting: Family

## 2023-09-13 VITALS — BP 141/91 | HR 93 | Temp 97.8°F | Ht 72.0 in | Wt 248.2 lb

## 2023-09-13 DIAGNOSIS — J4520 Mild intermittent asthma, uncomplicated: Secondary | ICD-10-CM

## 2023-09-13 DIAGNOSIS — E1122 Type 2 diabetes mellitus with diabetic chronic kidney disease: Secondary | ICD-10-CM

## 2023-09-13 DIAGNOSIS — C20 Malignant neoplasm of rectum: Secondary | ICD-10-CM

## 2023-09-13 DIAGNOSIS — R3914 Feeling of incomplete bladder emptying: Secondary | ICD-10-CM

## 2023-09-13 DIAGNOSIS — N183 Chronic kidney disease, stage 3 unspecified: Secondary | ICD-10-CM

## 2023-09-13 DIAGNOSIS — R5383 Other fatigue: Secondary | ICD-10-CM

## 2023-09-13 DIAGNOSIS — M5136 Other intervertebral disc degeneration, lumbar region with discogenic back pain only: Secondary | ICD-10-CM | POA: Diagnosis not present

## 2023-09-13 DIAGNOSIS — G8929 Other chronic pain: Secondary | ICD-10-CM

## 2023-09-13 DIAGNOSIS — E785 Hyperlipidemia, unspecified: Secondary | ICD-10-CM

## 2023-09-13 DIAGNOSIS — N401 Enlarged prostate with lower urinary tract symptoms: Secondary | ICD-10-CM | POA: Diagnosis not present

## 2023-09-13 DIAGNOSIS — K219 Gastro-esophageal reflux disease without esophagitis: Secondary | ICD-10-CM

## 2023-09-13 LAB — BAYER DCA HB A1C WAIVED: HB A1C (BAYER DCA - WAIVED): 6.8 % — ABNORMAL HIGH (ref 4.8–5.6)

## 2023-09-13 NOTE — Progress Notes (Signed)
 Subjective:    Patient ID: [name] [name], male    DOB: 09-20-1960, 63 y.o.   MRN: [id number]  Chief Complaint  Patient presents with   Medical Management of Chronic Issues   Pt presents to the office today for chronic follow up.    He had for Spinal Cord stimulator. He states this has been life changing.    He was diagnosed Rectal Cancer and completed chemo and radiation.  His cancer spread to liver and spine. His chemo was not working and has been stopped.   Requesting for his vit D to be checked for fatigue.  Asthma He complains of hoarse voice. There is no cough or wheezing. This is a chronic problem. The current episode started more than 1 year ago. The problem occurs intermittently. The problem has been resolved. Pertinent negatives include no heartburn or rhinorrhea. He reports moderate improvement on treatment. His past medical history is significant for asthma.  Gastroesophageal Reflux He complains of belching and a hoarse voice. He reports no coughing, no heartburn or no wheezing. This is a chronic problem. The current episode started more than 1 year ago. The problem occurs rarely. He has tried a PPI for the symptoms. The treatment provided moderate relief.  Diabetes He presents for his follow-up diabetic visit. He has type 2 diabetes mellitus. Associated symptoms include foot paresthesias. Pertinent negatives for diabetes include no blurred vision. Diabetic complications include peripheral neuropathy. Risk factors for coronary artery disease include dyslipidemia, diabetes mellitus, hypertension and sedentary lifestyle. He is following a diabetic diet. (Does not check at home) Eye exam is not current.  Back Pain This is a chronic problem. The current episode started more than 1 year ago. The problem occurs intermittently. The problem has been waxing and waning since onset. The pain is present in the lumbar spine. The quality of the pain is described as aching. The pain is at a  severity of 7/10. The pain is moderate. Risk factors include obesity. He has tried analgesics for the symptoms. The treatment provided moderate relief.  Benign Prostatic Hypertrophy This is a chronic problem. The current episode started more than 1 year ago. Irritative symptoms include nocturia (1). Past treatments include tamsulosin. The treatment provided moderate relief.  Hyperlipidemia This is a chronic problem. The current episode started more than 1 year ago. Exacerbating diseases include obesity. Current antihyperlipidemic treatment includes statins. The current treatment provides moderate improvement of lipids. Risk factors for coronary artery disease include dyslipidemia, diabetes mellitus, male sex, hypertension and a sedentary lifestyle.      Review of Systems  HENT:  Positive for hoarse voice. Negative for rhinorrhea.   Eyes:  Negative for blurred vision.  Respiratory:  Negative for cough and wheezing.   Gastrointestinal:  Negative for heartburn.  Genitourinary:  Positive for nocturia (1).  Musculoskeletal:  Positive for back pain.  All other systems reviewed and are negative.  Family History  Problem Relation Age of Onset   Diabetes Mother    Breast cancer Mother 33   Diabetes Father        type II   Colon polyps Sister        11+ polyps   Other Brother        reportedly negative genetic testing   Diabetes Brother    Colon cancer Maternal Aunt    Colon cancer Maternal Uncle        x2   Colon cancer Maternal Uncle        x3  Colon cancer Maternal Uncle    Colon cancer Maternal Uncle    Colon cancer Maternal Uncle    Colon cancer Maternal Uncle    Other Maternal Uncle        farm accident as a teen   Colon cancer Maternal Grandmother    Cancer Maternal Grandmother        breast, colon    Diabetes Paternal Grandfather    Colon cancer Cousin        <50   Colon cancer Cousin        maternal cousin's children   Esophageal cancer Neg Hx    Rectal cancer Neg Hx     Stomach cancer Neg Hx    Social History   Socioeconomic History   Marital status: Married    Spouse name: Not on file   Number of children: Not on file   Years of education: Not on file   Highest education level: 12th grade  Occupational History   Not on file  Tobacco Use   Smoking status: Former    Current packs/day: 0.00    Types: Cigarettes    Quit date: 1987    Years since quitting: 38.2    Passive exposure: Never   Smokeless tobacco: Never  Vaping Use   Vaping status: Never Used  Substance and Sexual Activity   Alcohol use: Not Currently    Comment: occasional beer   Drug use: No   Sexual activity: Yes  Other Topics Concern   Not on file  Social History Narrative   Not on file   Social Drivers of Health   Financial Resource Strain: Patient Declined (09/09/2023)   Overall Financial Resource Strain (CARDIA)    Difficulty of Paying Living Expenses: Patient declined  Food Insecurity: Patient Declined (09/09/2023)   Hunger Vital Sign    Worried About Running Out of Food in the Last Year: Patient declined    Ran Out of Food in the Last Year: Patient declined  Transportation Needs: No Transportation Needs (09/09/2023)   PRAPARE - Administrator, Civil Service (Medical): No    Lack of Transportation (Non-Medical): No  Physical Activity: Insufficiently Active (09/09/2023)   Exercise Vital Sign    Days of Exercise per Week: 2 days    Minutes of Exercise per Session: 10 min  Stress: No Stress Concern Present (09/09/2023)   [name] of Occupational Health - Occupational Stress Questionnaire    Feeling of Stress : Not at all  Social Connections: Unknown (09/09/2023)   Social Connection and Isolation Panel [NHANES]    Frequency of Communication with Friends and Family: More than three times a week    Frequency of Social Gatherings with Friends and Family: More than three times a week    Attends Religious Services: Patient declined    Doctor, general practice or Organizations: Patient declined    Attends Banker Meetings: Not on file    Marital Status: Married       Objective:   Physical Exam Vitals reviewed.  Constitutional:      General: He is not in acute distress.    Appearance: He is well-developed. He is obese.  HENT:     Head: Normocephalic.     Right Ear: Tympanic membrane normal.     Left Ear: Tympanic membrane normal.  Eyes:     General:        Right eye: No discharge.        Left eye:  No discharge.     Pupils: Pupils are equal, round, and reactive to light.  Neck:     Thyroid: No thyromegaly.  Cardiovascular:     Rate and Rhythm: Normal rate and regular rhythm.     Heart sounds: Normal heart sounds. No murmur heard. Pulmonary:     Effort: Pulmonary effort is normal. No respiratory distress.     Breath sounds: Normal breath sounds. No wheezing.  Abdominal:     General: Bowel sounds are normal. There is no distension.     Palpations: Abdomen is soft.     Tenderness: There is no abdominal tenderness.  Musculoskeletal:        General: Tenderness present.     Cervical back: Normal range of motion and neck supple.     Comments: Pain in lumbar with flexion and extension   Skin:    General: Skin is warm and dry.     Coloration: Skin is pale.     Findings: No erythema or rash.  Neurological:     Mental Status: He is alert and oriented to person, place, and time.     Cranial Nerves: No cranial nerve deficit.     Deep Tendon Reflexes: Reflexes are normal and symmetric.  Psychiatric:        Behavior: Behavior normal.        Thought Content: Thought content normal.        Judgment: Judgment normal.       BP (!) 141/91   Pulse 93   Temp 97.8 F (36.6 C) (Temporal)   Ht 6' (1.829 m)   Wt 248 lb 3.2 oz (112.6 kg)   SpO2 96%   BMI 33.66 kg/m      Assessment & Plan:   Hilliard Clark Pikus comes in today with chief complaint of Medical Management of Chronic Issues   Diagnosis and orders  addressed:  1. Mild intermittent asthma, unspecified whether complicated  2. Benign prostatic hyperplasia with incomplete bladder emptying  3. Chronic low back pain, unspecified back pain laterality, unspecified whether sciatica present  4. Diabetes mellitus with stage 3 chronic kidney disease (HCC) (Primary) - Bayer DCA Hb A1c Waived  5. Degeneration of intervertebral disc of lumbar region with discogenic back pain  6. Dyslipidemia  7. Gastroesophageal reflux disease, unspecified whether esophagitis present  8. Rectal cancer (HCC)  9. Other fatigue - VITAMIN D 25 Hydroxy (Vit-D Deficiency, Fractures)   Labs pending, labs reviewed from Oncologists Continue current medications  Keep follow up with Oncologists  Health Maintenance reviewed Diet and exercise encouraged  Follow up plan: 6 months   Jannifer Rodney, FNP

## 2023-09-13 NOTE — Patient Instructions (Signed)
Chronic Pain, Adult Chronic pain is a type of pain that lasts or keeps coming back for at least 3-6 months. You may have headaches, pain in the abdomen, or pain in other areas of the body. Chronic pain may be related to an illness, injury, or a health condition. Sometimes, the cause of chronic pain is not known. Chronic pain can make it hard for you to do daily activities. If it is not treated, chronic pain can lead to anxiety and depression. Treatment depends on the cause of your pain and how severe it is. You may need to work with a pain specialist to come up with a treatment plan. Many people benefit from two or more types of treatment to control their pain. Follow these instructions at home: Treatment plan Follow your treatment plan as told by your health care provider. This may include: Gentle, regular exercise. Eating a healthy diet that includes foods such as vegetables, fruits, fish, and lean meats. Mental health therapy (cognitive or behavioral therapy) that changes the way you think or act in response to the pain. This may help improve how you feel. Doing physical therapy exercises to improve movement and strength. Meditation, yoga, acupuncture, or massage therapy. Using the oils from plants in your environment or on your skin (aromatherapy). Other treatments may include: Over-the-counter or prescription medicines. Color, light, or sound therapy. Local electrical stimulation. The electrical pulses help to relieve pain by temporarily stopping the nerve impulses that cause you to feel pain. Injections. These deliver numbing or pain-relieving medicines into the spine or the area of pain.  Medicines Take over-the-counter and prescription medicines only as told by your health care provider. Ask your health care provider if the medicine prescribed to you: Requires you to avoid driving or using machinery. Can cause constipation. You may need to take these actions to prevent or treat  constipation: Drink enough fluid to keep your urine pale yellow. Take over-the-counter or prescription medicines. Eat foods that are high in fiber, such as beans, whole grains, and fresh fruits and vegetables. Limit foods that are high in fat and processed sugars, such as fried or sweet foods. Lifestyle  Ask your health care provider whether you should keep a pain diary. Your health care provider will tell you what information to write in the diary. This may include: When you have pain. What the pain feels like. How medicines and other behaviors or treatments help to reduce the pain. Consider talking with a mental health care provider about how to help manage chronic pain. Consider joining a chronic pain support group. Try to control or lower your stress levels. Talk with your health care provider about ways to do this. General instructions Learn as much as you can about how to manage your chronic pain. Ask your health care provider if an intensive pain rehabilitation program or a chronic pain specialist would be helpful. Check your pain level as told by your health care provider. Ask your health care provider if you should use a pain scale. Contact a health care provider if: Your pain is not controlled with treatment. You have new pain. You have side effects from pain medicine. You feel weak or you have trouble doing your normal activities. You have trouble sleeping or you develop confusion. You lose feeling or have numbness in your body. You lose control of your bowels or bladder. Get help right away if: Your pain suddenly gets much worse. You develop chest pain. You have trouble breathing or shortness of  breath. You faint, or another person sees you faint. These symptoms may be an emergency. Get help right away. Call 911. Do not wait to see if the symptoms will go away. Do not drive yourself to the hospital. Also, get help right away if: You have thoughts about hurting yourself  or others. Take one of these steps if you feel like you may hurt yourself or others, or have thoughts about taking your own life: Go to your nearest emergency room. Call 911. Call the National Suicide Prevention Lifeline at 613-055-8912 or 988. This is open 24 hours a day. Text the Crisis Text Line at 323-193-1954. This information is not intended to replace advice given to you by your health care provider. Make sure you discuss any questions you have with your health care provider. Document Revised: 02/01/2022 Document Reviewed: 01/04/2022 Elsevier Patient Education  2024 [employer].

## 2023-09-14 ENCOUNTER — Other Ambulatory Visit: Payer: Self-pay | Admitting: Oncology

## 2023-09-14 ENCOUNTER — Other Ambulatory Visit: Payer: Self-pay

## 2023-09-14 ENCOUNTER — Other Ambulatory Visit: Payer: Self-pay | Admitting: Family

## 2023-09-14 DIAGNOSIS — C20 Malignant neoplasm of rectum: Secondary | ICD-10-CM

## 2023-09-14 DIAGNOSIS — E559 Vitamin D deficiency, unspecified: Secondary | ICD-10-CM | POA: Insufficient documentation

## 2023-09-14 LAB — VITAMIN D 25 HYDROXY (VIT D DEFICIENCY, FRACTURES): Vit D, 25-Hydroxy: 16.8 ng/mL — ABNORMAL LOW (ref 30.0–100.0)

## 2023-09-14 MED ORDER — VITAMIN D (ERGOCALCIFEROL) 1.25 MG (50000 UNIT) PO CAPS: 50000.0000 [IU] | ORAL_CAPSULE | ORAL | 3 refills | Status: DC

## 2023-09-14 NOTE — Telephone Encounter (Signed)
 May be on hold--f/u with MD

## 2023-09-18 NOTE — Telephone Encounter (Signed)
 Will decide on 3/26 appointment. Scan shows progression

## 2023-09-19 ENCOUNTER — Other Ambulatory Visit: Payer: Self-pay | Admitting: Nurse Practitioner

## 2023-09-19 ENCOUNTER — Encounter: Payer: Self-pay | Admitting: Oncology

## 2023-09-19 ENCOUNTER — Inpatient Hospital Stay: Admitting: Oncology

## 2023-09-19 VITALS — BP 118/85 | HR 98 | Temp 98.2°F | Resp 18 | Ht 72.0 in | Wt 251.2 lb

## 2023-09-19 DIAGNOSIS — Z5111 Encounter for antineoplastic chemotherapy: Secondary | ICD-10-CM | POA: Diagnosis not present

## 2023-09-19 DIAGNOSIS — C20 Malignant neoplasm of rectum: Secondary | ICD-10-CM

## 2023-09-19 MED ORDER — MORPHINE SULFATE 15 MG PO TABS: 15.0000 mg | ORAL_TABLET | ORAL | 0 refills | Status: DC | PRN

## 2023-09-19 MED ORDER — FENTANYL 25 MCG/HR TD PT72: 1.0000 | MEDICATED_PATCH | TRANSDERMAL | 0 refills | Status: DC
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
## 2023-09-20 ENCOUNTER — Other Ambulatory Visit: Payer: Self-pay | Admitting: Nurse Practitioner

## 2023-09-20 DIAGNOSIS — C20 Malignant neoplasm of rectum: Secondary | ICD-10-CM

## 2023-09-20 DIAGNOSIS — M549 Dorsalgia, unspecified: Secondary | ICD-10-CM

## 2023-09-21 ENCOUNTER — Other Ambulatory Visit: Payer: Self-pay | Admitting: *Deleted

## 2023-09-21 ENCOUNTER — Encounter: Payer: Self-pay | Admitting: Oncology

## 2023-09-21 DIAGNOSIS — C20 Malignant neoplasm of rectum: Secondary | ICD-10-CM

## 2023-09-21 MED ORDER — LONSURF 20-8.19 MG PO TABS: 60.0000 mg | ORAL_TABLET | Freq: Two times a day (BID) | ORAL | 0 refills | Status: DC

## 2023-09-24 ENCOUNTER — Inpatient Hospital Stay

## 2023-09-24 ENCOUNTER — Other Ambulatory Visit: Payer: Self-pay | Admitting: Oncology

## 2023-09-24 ENCOUNTER — Other Ambulatory Visit

## 2023-09-24 ENCOUNTER — Ambulatory Visit: Admitting: Nurse Practitioner

## 2023-09-24 ENCOUNTER — Other Ambulatory Visit: Payer: Self-pay | Admitting: *Deleted

## 2023-09-24 ENCOUNTER — Encounter: Payer: Self-pay | Admitting: Oncology

## 2023-09-24 ENCOUNTER — Encounter: Payer: Self-pay | Admitting: *Deleted

## 2023-09-24 VITALS — BP 130/92 | HR 90 | Temp 98.2°F | Resp 18

## 2023-09-24 DIAGNOSIS — C20 Malignant neoplasm of rectum: Secondary | ICD-10-CM

## 2023-09-24 DIAGNOSIS — Z5111 Encounter for antineoplastic chemotherapy: Secondary | ICD-10-CM | POA: Diagnosis not present

## 2023-09-24 LAB — CBC WITH DIFFERENTIAL (CANCER CENTER ONLY)
Abs Immature Granulocytes: 0.01 10*3/uL (ref 0.00–0.07)
Basophils Absolute: 0.1 10*3/uL (ref 0.0–0.1)
Basophils Relative: 1 %
Eosinophils Absolute: 0.1 10*3/uL (ref 0.0–0.5)
Eosinophils Relative: 2 %
HCT: 42.4 % (ref 39.0–52.0)
Hemoglobin: 13.7 g/dL (ref 13.0–17.0)
Immature Granulocytes: 0 %
Lymphocytes Relative: 10 %
Lymphs Abs: 0.5 10*3/uL — ABNORMAL LOW (ref 0.7–4.0)
MCH: 28.2 pg (ref 26.0–34.0)
MCHC: 32.3 g/dL (ref 30.0–36.0)
MCV: 87.2 fL (ref 80.0–100.0)
Monocytes Absolute: 0.4 10*3/uL (ref 0.1–1.0)
Monocytes Relative: 7 %
Neutro Abs: 3.8 10*3/uL (ref 1.7–7.7)
Neutrophils Relative %: 80 %
Platelet Count: 217 10*3/uL (ref 150–400)
RBC: 4.86 MIL/uL (ref 4.22–5.81)
RDW: 17 % — ABNORMAL HIGH (ref 11.5–15.5)
WBC Count: 4.7 10*3/uL (ref 4.0–10.5)
nRBC: 0 % (ref 0.0–0.2)

## 2023-09-24 LAB — PROTEIN, URINE, RANDOM: Total Protein, Urine: <6 mg/dL

## 2023-09-24 LAB — TOTAL PROTEIN, URINE DIPSTICK: Protein, ur: NEGATIVE mg/dL

## 2023-09-24 MED ORDER — SODIUM CHLORIDE 0.9 % IV SOLN
5.0000 mg/kg | Freq: Once | INTRAVENOUS | Status: AC
  Administered 2023-09-24: 600 mg via INTRAVENOUS
  Filled 2023-09-24: qty 16

## 2023-09-24 MED ORDER — SODIUM CHLORIDE 0.9% FLUSH
10.0000 mL | INTRAVENOUS | Status: DC | PRN
  Administered 2023-09-24: 10 mL

## 2023-09-24 MED ORDER — HEPARIN SOD (PORK) LOCK FLUSH 100 UNIT/ML IV SOLN
500.0000 [IU] | Freq: Once | INTRAVENOUS | Status: AC | PRN
  Administered 2023-09-24: 500 [IU]

## 2023-09-24 MED ORDER — HYDROMORPHONE HCL 4 MG PO TABS: 4.0000 mg | ORAL_TABLET | ORAL | 0 refills | Status: DC | PRN

## 2023-09-24 MED ORDER — OXYCODONE HCL ER 20 MG PO T12A: 20.0000 mg | EXTENDED_RELEASE_TABLET | Freq: Two times a day (BID) | ORAL | 0 refills | Status: DC

## 2023-09-24 MED ORDER — SODIUM CHLORIDE 0.9 % IV SOLN: INTRAVENOUS | Status: DC

## 2023-09-24 NOTE — Patient Instructions (Signed)
 [facility]  Discharge Instructions: Thank you for choosing [facility] to provide your oncology and hematology care.   If you have a lab appointment with the Cancer Center, please go directly to the Cancer Center and check in at the registration area.   Wear comfortable clothing and clothing appropriate for easy access to any Portacath or PICC line.   We strive to give you quality time with your provider. You may need to reschedule your appointment if you arrive late (15 or more minutes).  Arriving late affects you and other patients whose appointments are after yours.  Also, if you miss three or more appointments without notifying the office, you may be dismissed from the clinic at the provider's discretion.      For prescription refill requests, have your pharmacy contact our office and allow 72 hours for refills to be completed.    Today you received the following chemotherapy and/or immunotherapy agent: Bevacizumab       To help prevent nausea and vomiting after your treatment, we encourage you to take your nausea medication as directed.  BELOW ARE SYMPTOMS THAT SHOULD BE REPORTED IMMEDIATELY: *FEVER GREATER THAN 100.4 F (38 C) OR HIGHER *CHILLS OR SWEATING *NAUSEA AND VOMITING THAT IS NOT CONTROLLED WITH YOUR NAUSEA MEDICATION *UNUSUAL SHORTNESS OF BREATH *UNUSUAL BRUISING OR BLEEDING *URINARY PROBLEMS (pain or burning when urinating, or frequent urination) *BOWEL PROBLEMS (unusual diarrhea, constipation, pain near the anus) TENDERNESS IN MOUTH AND THROAT WITH OR WITHOUT PRESENCE OF ULCERS (sore throat, sores in mouth, or a toothache) UNUSUAL RASH, SWELLING OR PAIN  UNUSUAL VAGINAL DISCHARGE OR ITCHING   Items with * indicate a potential emergency and should be followed up as soon as possible or go to the Emergency Department if any problems should occur.  Please show the CHEMOTHERAPY ALERT CARD or  IMMUNOTHERAPY ALERT CARD at check-in to the Emergency Department and triage nurse.  Should you have questions after your visit or need to cancel or reschedule your appointment, please contact [facility]  Dept: [phone]  and follow the prompts.  Office hours are 8:00 a.m. to 4:30 p.m. Monday - Friday. Please note that voicemails left after 4:00 p.m. may not be returned until the following business day.  We are closed weekends and major holidays. You have access to a nurse at all times for urgent questions. Please call the main number to the clinic Dept: [phone] and follow the prompts.   For any non-urgent questions, you may also contact your provider using MyChart. We now offer e-Visits for anyone 22 and older to request care online for non-urgent symptoms. For details visit [email or web address].   Also download the MyChart app! Go to the app store, search "MyChart", open the app, select [place], and log in with your MyChart username and password.  Bevacizumab Injection What is this medication? BEVACIZUMAB (be va SIZ yoo mab) treats some types of cancer. It works by blocking a protein that causes cancer cells to grow and multiply. This helps to slow or stop the spread of cancer cells. It is a monoclonal antibody. This medicine may be used for other purposes; ask your health care provider or pharmacist if you have questions. COMMON BRAND NAME(S): Alymsys, Avastin, MVASI, Rosaland Lao What should I tell my care team before I take this medication? They need to know if you have any of  these conditions: Blood clots Coughing up blood Having or recent surgery Heart failure High blood pressure History of a connection between 2 or more body parts that do not usually connect (fistula) History of a tear in your stomach or intestines Protein in your urine An unusual or allergic reaction to bevacizumab, other medications, foods,  dyes, or preservatives Pregnant or trying to get pregnant Breast-feeding How should I use this medication? This medication is injected into a vein. It is given by your care team in a hospital or clinic setting. Talk to your care team the use of this medication in children. Special care may be needed. Overdosage: If you think you have taken too much of this medicine contact a poison control center or emergency room at once. NOTE: This medicine is only for you. Do not share this medicine with others. What if I miss a dose? Keep appointments for follow-up doses. It is important not to miss your dose. Call your care team if you are unable to keep an appointment. What may interact with this medication? Interactions are not expected. This list may not describe all possible interactions. Give your health care provider a list of all the medicines, herbs, non-prescription drugs, or dietary supplements you use. Also tell them if you smoke, drink alcohol, or use illegal drugs. Some items may interact with your medicine. What should I watch for while using this medication? Your condition will be monitored carefully while you are receiving this medication. You may need blood work while taking this medication. This medication may make you feel generally unwell. This is not uncommon as chemotherapy can affect healthy cells as well as cancer cells. Report any side effects. Continue your course of treatment even though you feel ill unless your care team tells you to stop. This medication may increase your risk to bruise or bleed. Call your care team if you notice any unusual bleeding. Before having surgery, talk to your care team to make sure it is ok. This medication can increase the risk of poor healing of your surgical site or wound. You will need to stop this medication for 28 days before surgery. After surgery, wait at least 28 days before restarting this medication. Make sure the surgical site or wound is  healed enough before restarting this medication. Talk to your care team if questions. Talk to your care team if you may be pregnant. Serious birth defects can occur if you take this medication during pregnancy and for 6 months after the last dose. Contraception is recommended while taking this medication and for 6 months after the last dose. Your care team can help you find the option that works for you. Do not breastfeed while taking this medication and for 6 months after the last dose. This medication can cause infertility. Talk to your care team if you are concerned about your fertility. What side effects may I notice from receiving this medication? Side effects that you should report to your care team as soon as possible: Allergic reactions--skin rash, itching, hives, swelling of the face, lips, tongue, or throat Bleeding--bloody or black, tar-like stools, vomiting blood or brown material that looks like coffee grounds, red or dark brown urine, small red or purple spots on skin, unusual bruising or bleeding Blood clot--pain, swelling, or warmth in the leg, shortness of breath, chest pain Heart attack--pain or tightness in the chest, shoulders, arms, or jaw, nausea, shortness of breath, cold or clammy skin, feeling faint or lightheaded Heart failure--shortness of breath, swelling  of the ankles, feet, or hands, sudden weight gain, unusual weakness or fatigue Increase in blood pressure Infection--fever, chills, cough, sore throat, wounds that don't heal, pain or trouble when passing urine, general feeling of discomfort or being unwell Infusion reactions--chest pain, shortness of breath or trouble breathing, feeling faint or lightheaded Kidney injury--decrease in the amount of urine, swelling of the ankles, hands, or feet Stomach pain that is severe, does not go away, or gets worse Stroke--sudden numbness or weakness of the face, arm, or leg, trouble speaking, confusion, trouble walking, loss of  balance or coordination, dizziness, severe headache, change in vision Sudden and severe headache, confusion, change in vision, seizures, which may be signs of posterior reversible encephalopathy syndrome (PRES) Side effects that usually do not require medical attention (report to your care team if they continue or are bothersome): Back pain Change in taste Diarrhea Dry skin Increased tears Nosebleed This list may not describe all possible side effects. Call your doctor for medical advice about side effects. You may report side effects to FDA at 1-800-FDA-1088. Where should I keep my medication? This medication is given in a hospital or clinic. It will not be stored at home. NOTE: This sheet is a summary. It may not cover all possible information. If you have questions about this medicine, talk to your doctor, pharmacist, or health care provider.  2024 Elsevier/Gold Standard (2021-10-28 00:00:00)

## 2023-09-24 NOTE — Progress Notes (Signed)
 Per Dr. Truett Perna, ok to begin treatment prior to urine protein result.

## 2023-09-25 ENCOUNTER — Telehealth: Payer: Self-pay

## 2023-09-25 NOTE — Telephone Encounter (Signed)
-----   Message from Lonna Cobb sent at 09/19/2023  2:52 PM EDT ----- Please call him-Dr. Truett Perna spoke to the radiologist.  Recommendation is for a PET scan.  I will place the order.  Please cancel the bone scan.

## 2023-09-25 NOTE — Telephone Encounter (Signed)
 Patient is scheduler for his PET scan.

## 2023-09-26 ENCOUNTER — Ambulatory Visit (HOSPITAL_COMMUNITY)
Admission: RE | Admit: 2023-09-26 | Discharge: 2023-09-26 | Disposition: A | Discharge status: Home / Self Care | Source: Ambulatory Visit | Attending: Nurse Practitioner | Admitting: Nurse Practitioner

## 2023-09-26 DIAGNOSIS — M549 Dorsalgia, unspecified: Secondary | ICD-10-CM | POA: Insufficient documentation

## 2023-09-26 DIAGNOSIS — C20 Malignant neoplasm of rectum: Secondary | ICD-10-CM | POA: Insufficient documentation

## 2023-09-26 LAB — GLUCOSE, CAPILLARY: Glucose-Capillary: 139 mg/dL — ABNORMAL HIGH (ref 70–99)

## 2023-09-26 MED ORDER — FLUDEOXYGLUCOSE F - 18 (FDG) INJECTION
12.5000 | Freq: Once | INTRAVENOUS | Status: AC
  Administered 2023-09-26: 12.5 via INTRAVENOUS

## 2023-09-27 ENCOUNTER — Encounter: Payer: Self-pay | Admitting: *Deleted

## 2023-09-27 ENCOUNTER — Other Ambulatory Visit: Payer: Self-pay | Admitting: *Deleted

## 2023-09-27 DIAGNOSIS — C20 Malignant neoplasm of rectum: Secondary | ICD-10-CM

## 2023-09-27 NOTE — Progress Notes (Signed)
 Placed referral for palliative RT to T-spine and Right rib cage for pain per Dr. Truett Perna.

## 2023-10-01 ENCOUNTER — Other Ambulatory Visit (HOSPITAL_COMMUNITY)

## 2023-10-01 ENCOUNTER — Other Ambulatory Visit

## 2023-10-01 ENCOUNTER — Ambulatory Visit: Admitting: Nurse Practitioner

## 2023-10-02 ENCOUNTER — Telehealth: Payer: Self-pay | Admitting: Radiation Oncology

## 2023-10-02 ENCOUNTER — Ambulatory Visit: Admission: RE | Admit: 2023-10-02 | Source: Ambulatory Visit | Admitting: Radiation Oncology

## 2023-10-02 ENCOUNTER — Ambulatory Visit

## 2023-10-02 ENCOUNTER — Ambulatory Visit: Admitting: Radiation Oncology

## 2023-10-02 NOTE — Telephone Encounter (Signed)
 Pt's wife called to advise pt had severe onset of vertigo and was asking to r/s. Pt was r/s to 4/10@7 :30am & 9:00am SIM at wife's request.

## 2023-10-02 NOTE — Progress Notes (Signed)
 Apt moved

## 2023-10-04 ENCOUNTER — Encounter: Payer: Self-pay | Admitting: Radiation Oncology

## 2023-10-04 ENCOUNTER — Ambulatory Visit
Admission: RE | Admit: 2023-10-04 | Discharge: 2023-10-04 | Disposition: A | Discharge status: Home / Self Care | Source: Ambulatory Visit | Attending: Radiation Oncology | Admitting: Radiation Oncology

## 2023-10-04 ENCOUNTER — Ambulatory Visit

## 2023-10-04 VITALS — BP 122/79 | HR 91 | Temp 97.1°F | Resp 18 | Ht 72.0 in | Wt 239.1 lb

## 2023-10-04 DIAGNOSIS — C19 Malignant neoplasm of rectosigmoid junction: Secondary | ICD-10-CM | POA: Insufficient documentation

## 2023-10-04 DIAGNOSIS — C20 Malignant neoplasm of rectum: Secondary | ICD-10-CM | POA: Diagnosis not present

## 2023-10-04 DIAGNOSIS — Z923 Personal history of irradiation: Secondary | ICD-10-CM | POA: Insufficient documentation

## 2023-10-04 DIAGNOSIS — Z7984 Long term (current) use of oral hypoglycemic drugs: Secondary | ICD-10-CM | POA: Insufficient documentation

## 2023-10-04 DIAGNOSIS — Z800 Family history of malignant neoplasm of digestive organs: Secondary | ICD-10-CM | POA: Insufficient documentation

## 2023-10-04 DIAGNOSIS — M549 Dorsalgia, unspecified: Secondary | ICD-10-CM | POA: Diagnosis not present

## 2023-10-04 DIAGNOSIS — Z510 Encounter for antineoplastic radiation therapy: Secondary | ICD-10-CM | POA: Diagnosis present

## 2023-10-04 DIAGNOSIS — I700 Atherosclerosis of aorta: Secondary | ICD-10-CM | POA: Diagnosis not present

## 2023-10-04 DIAGNOSIS — Z7985 Long-term (current) use of injectable non-insulin antidiabetic drugs: Secondary | ICD-10-CM | POA: Insufficient documentation

## 2023-10-04 DIAGNOSIS — C787 Secondary malignant neoplasm of liver and intrahepatic bile duct: Secondary | ICD-10-CM | POA: Diagnosis not present

## 2023-10-04 DIAGNOSIS — C7951 Secondary malignant neoplasm of bone: Secondary | ICD-10-CM

## 2023-10-04 DIAGNOSIS — R160 Hepatomegaly, not elsewhere classified: Secondary | ICD-10-CM | POA: Diagnosis not present

## 2023-10-04 DIAGNOSIS — G620 Drug-induced polyneuropathy: Secondary | ICD-10-CM | POA: Insufficient documentation

## 2023-10-04 DIAGNOSIS — I2510 Atherosclerotic heart disease of native coronary artery without angina pectoris: Secondary | ICD-10-CM | POA: Insufficient documentation

## 2023-10-04 DIAGNOSIS — Z803 Family history of malignant neoplasm of breast: Secondary | ICD-10-CM | POA: Insufficient documentation

## 2023-10-04 DIAGNOSIS — Z79899 Other long term (current) drug therapy: Secondary | ICD-10-CM | POA: Insufficient documentation

## 2023-10-04 DIAGNOSIS — K760 Fatty (change of) liver, not elsewhere classified: Secondary | ICD-10-CM | POA: Diagnosis not present

## 2023-10-04 DIAGNOSIS — J320 Chronic maxillary sinusitis: Secondary | ICD-10-CM | POA: Insufficient documentation

## 2023-10-04 DIAGNOSIS — Z5111 Encounter for antineoplastic chemotherapy: Secondary | ICD-10-CM | POA: Insufficient documentation

## 2023-10-04 DIAGNOSIS — G8929 Other chronic pain: Secondary | ICD-10-CM | POA: Insufficient documentation

## 2023-10-04 NOTE — Progress Notes (Signed)
 Nursing interview for diagnosis of Metastatic rectal cancer with mets to t-spine and right rib cage. Possible need for palliative radiation therapy for pain. Narcotics non-effective.   Patient identity verified x2.  Patient reports as follows:   -Pain: Yes-  increased pain/tenderness in the posterior RT chest wall and RT lower back/hip 8/10, worsens w/ movement. Oxycodone/Percocet slightly-effective at 5mg . Pain medication causes nausea. Patient is also currently experiencing improving vertigo x2 days.  -Fatigue: Severe -Skin changes: None -Bladder changes: None -Bowel changes: None -Lower extremity weakness: None -Dexamethasone: None  Patient denies any other related issues at this time.  Meaningful use complete.  PET/CT scan 09/26/23- Impression/ findings are as follows: IMPRESSION: 1. Numerous hypermetabolic liver metastases, with central necrosis but abnormal hypermetabolic activity along the periphery of the lesions. 2. 1.2 cm portacaval lymph node with maximum SUV 3.5, compatible with malignancy. 3. Scattered hypermetabolic skeletal metastatic lesions primarily visible in the thoracic spine, ribs, right proximal humerus, to a lesser extent the lumbar spine. No well-defined pathologic fracture or findings of extra osseous extension of visible spinal metastatic lesions into the spinal canal. 4. Mild chronic right maxillary sinusitis. 5. Hepatic steatosis. 6.  Aortic Atherosclerosis (ICD10-I70.0).  Coronary atherosclerosis.  SAFETY ISSUES: Prior radiation? Yes- Radiation Treatment Dates: 05/29/2022 through 07/07/2022 Site Technique Total Dose (Gy) Dose per Fx (Gy) Completed Fx Beam Energies  Rectum: Rectum 3D 45/45 1.8 25/25 6X  Rectum: Rectum_Bst 3D 5.4/5.4 1.8 3/3 6X   Pacemaker/ICD? No Possible current pregnancy? N/A Is the patient on methotrexate? no  Additional Complaints / other details: Oncologist Dr. Truett Perna / CRS: Dr. Cliffton Asters  Spinal cord stimulator in place but  is currently turned off.  Vitals- BP 122/79 (BP Location: Left Arm, Patient Position: Sitting)   Pulse 91   Temp (!) 97.1 F (36.2 C) (Temporal)   Resp 18   Ht 6' (1.829 m)   Wt 239 lb 2 oz (108.5 kg)   SpO2 97%   BMI 32.43 kg/m   This concludes the interaction.  Ruel Favors, LPN

## 2023-10-05 NOTE — Progress Notes (Signed)
 Radiation Oncology         [phone] ________________________________  Name: [name] [name] MRN: [id number]  Date: 10/04/2023  DOB: 05-08-1961  Follow-Up Visit Note  CC: Junie Spencer, FNP  Ladene Artist, MD  Diagnosis:   Stage IV rectal cancer  Interval Since Last Radiation: The patient completed pelvic radiation treatment on 07/07/2022  Narrative:  The patient returns today for routine follow-up.  The patient today complains of some significant pain in the low back region extending to the right flank.  He has undergone recent imaging which has shown bony metastasis including a T12 metastasis within the spine.  Some of his imaging has been at [facility].  A recent PET scan also shows additional areas of hypermetabolic activity including some adjacent levels within the lumbar spine.  The patient states that his pain has been significantly worse over the last couple of weeks.  I have been asked to see him for consideration of palliative radiation treatment.                              ALLERGIES:  is allergic to other and wound dressing adhesive.  Meds: Current Outpatient Medications  Medication Sig Dispense Refill   amoxicillin (AMOXIL) 500 MG capsule Take 1 capsule (500 mg total) by mouth 3 (three) times daily. 90 capsule 0   Continuous Glucose Sensor (FREESTYLE LIBRE 3 SENSOR) MISC Place 1 sensor on the skin every 14 days. Use to check glucose continuously.  DX: E11.65 2 each 11   Dulaglutide (TRULICITY) 1.5 MG/0.5ML SOPN Inject 1.5 mg into the skin once a week. 6 mL 2   empagliflozin (JARDIANCE) 25 MG TABS tablet Take 1 tablet (25 mg total) by mouth daily before breakfast. 90 tablet 0   fluticasone (FLONASE) 50 MCG/ACT nasal spray Place 2 sprays into both nostrils daily. 16 mL 2   HYDROmorphone (DILAUDID) 4 MG tablet Take 1-2 tablets (4-8 mg total) by mouth every 4 (four) hours as needed for severe pain (pain score 7-10). 60 tablet 0   levocetirizine (XYZAL) 5 MG tablet Take 1  tablet (5 mg total) by mouth every evening. 90 tablet 1   LORazepam (ATIVAN) 1 MG tablet Take 1 tablet (1 mg total) by mouth every 8 (eight) hours as needed for anxiety or sleep (nausea). 60 tablet 0   metFORMIN (GLUCOPHAGE) 500 MG tablet Take 1 tablet (500 mg total) by mouth 2 (two) times daily with a meal. 180 tablet 0   montelukast (SINGULAIR) 10 MG tablet Take 1 tablet (10 mg total) by mouth at bedtime. 90 tablet 1   naproxen sodium (ALEVE) 220 MG tablet Take 440 mg by mouth 2 (two) times daily.     omeprazole (PRILOSEC) 40 MG capsule Take 1 capsule (40 mg total) by mouth daily as needed. For acid reflux 90 capsule 0   ondansetron (ZOFRAN) 8 MG tablet Take 1 tablet (8 mg total) by mouth every 8 (eight) hours as needed for nausea or vomiting. 30 tablet 3   ondansetron (ZOFRAN-ODT) 8 MG disintegrating tablet Take 1 tablet (8 mg total) by mouth every 8 (eight) hours as needed for nausea or vomiting. 30 tablet 3   oxyCODONE (OXYCONTIN) 20 mg 12 hr tablet Take 1 tablet (20 mg total) by mouth every 12 (twelve) hours. 28 tablet 0   prochlorperazine (COMPAZINE) 10 MG tablet Take 1 tablet (10 mg total) by mouth every 6 (six) hours as needed for  nausea or vomiting. 30 tablet 3   rosuvastatin (CRESTOR) 10 MG tablet Take 1 tablet (10 mg total) by mouth daily. 90 tablet 0   tamsulosin (FLOMAX) 0.4 MG CAPS capsule Take 2 capsules (0.8 mg total) by mouth daily. 180 capsule 0   trifluridine-tipiracil (LONSURF) 20-8.19 MG tablet Take 3 tablets (60 mg of trifluridine total) by mouth 2 (two) times daily after a meal. Take within 1 hr after AM & PM meals on days 1-5, 8-12. Repeat every 28 days. Start cycle on 09/10/2023 60 tablet 0   Vitamin D, Ergocalciferol, (DRISDOL) 1.25 MG (50000 UNIT) CAPS capsule Take 1 capsule (50,000 Units total) by mouth every 7 (seven) days. 12 capsule 3   No current facility-administered medications for this encounter.    Physical Findings: The patient is in no acute distress. Patient  is alert and oriented.  height is 6' (1.829 m) and weight is 239 lb 2 oz (108.5 kg). His temporal temperature is 97.1 F (36.2 C) (abnormal). His blood pressure is 122/79 and his pulse is 91. His respiration is 18 and oxygen saturation is 97%. .   Alert, no acute distress  Lab Findings: Lab Results  Component Value Date   WBC 4.7 09/24/2023   HGB 13.7 09/24/2023   HCT 42.4 09/24/2023   MCV 87.2 09/24/2023   PLT 217 09/24/2023     Radiographic Findings: NM PET Image Initial (PI) Skull Base To Thigh Result Date: 09/26/2023 CLINICAL DATA:  Subsequent treatment strategy for metastatic rectal cancer. Right-sided back pain. Ongoing chemotherapy. EXAM: NUCLEAR MEDICINE PET SKULL BASE TO THIGH TECHNIQUE: 12.5 mCi F-18 FDG was injected intravenously. Full-ring PET imaging was performed from the skull base to thigh after the radiotracer. CT data was obtained and used for attenuation correction and anatomic localization. Fasting blood glucose: 139 mg/dl COMPARISON:  Multiple exams, including CT scan dated 08/31/2023 FINDINGS: Mediastinal blood pool activity: SUV max 3.1 Liver activity: SUV max NA NECK: No significant abnormal hypermetabolic activity in this region. Incidental CT findings: Mild chronic right maxillary sinusitis. CHEST: No significant abnormal hypermetabolic activity in this region. Incidental CT findings: Right Port-A-Cath tip: Right atrium. Atherosclerotic thoracic aorta and left anterior descending coronary artery. Old granulomatous disease. ABDOMEN/PELVIS: Scattered liver masses demonstrate central necrosis but abnormal hypermetabolic activity along the periphery. Example, along the periphery of the dominant centrally necrotic left hepatic lobe lesion measuring about 9 cm in long axis, maximum SUV is 10.6. Numerous lesions are scattered in the liver. 1.2 cm portacaval lymph node with maximum SUV 3.5, compatible with malignancy. Incidental CT findings: Therapy related presacral findings with  only low-grade activity. Anastomotic staple line in the rectum. Atherosclerosis is present, including aortoiliac atherosclerotic disease. Hepatic steatosis. Dorsal column stimulator noted. SKELETON: There are few scattered hypermetabolic skeletal metastatic lesions primarily visible in the thoracic spine, ribs, right proximal humerus, to a lesser extent the lumbar spine. Right proximal humeral lesion with maximum SUV 16.4. In the T12 vertebral body there is a left eccentric lesion with maximum SUV 8.5 and a right pedicle lesion with maximum SUV 6.2. No well-defined pathologic fracture. No compelling findings of extra osseous extension of visible spinal metastatic lesions into the spinal canal. Incidental CT findings: Lower cervical plate and screw fixator. IMPRESSION: 1. Numerous hypermetabolic liver metastases, with central necrosis but abnormal hypermetabolic activity along the periphery of the lesions. 2. 1.2 cm portacaval lymph node with maximum SUV 3.5, compatible with malignancy. 3. Scattered hypermetabolic skeletal metastatic lesions primarily visible in the thoracic spine, ribs, right  proximal humerus, to a lesser extent the lumbar spine. No well-defined pathologic fracture or findings of extra osseous extension of visible spinal metastatic lesions into the spinal canal. 4. Mild chronic right maxillary sinusitis. 5. Hepatic steatosis. 6.  Aortic Atherosclerosis (ICD10-I70.0).  Coronary atherosclerosis. Electronically Signed   By: Gaylyn Rong M.D.   On: 09/26/2023 11:36    Impression/ Plan:    The patient was seen today for his diagnosis of stage IV rectal cancer with bone metastasis.  I believe that he is a good candidate for palliative radiation treatment to the lower T/L-spine.  The patient is having significant pain currently and improvement in this would certainly improve his quality of life.  He is continuing with ongoing chemotherapy.  I discussed with him the recommendation to proceed with a  course of palliative radiation treatment for 3 weeks which will be given with concurrent chemotherapy.  We discussed the rationale of treatment as well as possible side effects and risks.  All of his questions were answered.  He will undergo simulation later this morning for treatment planning.  We will begin his treatment early next week.  The patient was seen in person today in clinic.  The total time spent on the patient's visit today was 45 minutes, including chart review, direct discussion/evaluation with the patient, and coordination of care.      Radene Gunning, M.D., Ph.D.

## 2023-10-08 ENCOUNTER — Other Ambulatory Visit: Payer: Self-pay | Admitting: Oncology

## 2023-10-08 ENCOUNTER — Other Ambulatory Visit: Payer: Self-pay | Admitting: Family

## 2023-10-08 ENCOUNTER — Other Ambulatory Visit: Payer: Self-pay

## 2023-10-08 ENCOUNTER — Ambulatory Visit
Admission: RE | Admit: 2023-10-08 | Discharge: 2023-10-08 | Disposition: A | Discharge status: Home / Self Care | Source: Ambulatory Visit | Attending: Radiation Oncology | Admitting: Radiation Oncology

## 2023-10-08 DIAGNOSIS — Z510 Encounter for antineoplastic radiation therapy: Secondary | ICD-10-CM | POA: Diagnosis not present

## 2023-10-08 DIAGNOSIS — N401 Enlarged prostate with lower urinary tract symptoms: Secondary | ICD-10-CM

## 2023-10-08 LAB — RAD ONC ARIA SESSION SUMMARY
Course Elapsed Days: 0
Plan Fractions Treated to Date: 1
Plan Prescribed Dose Per Fraction: 2.5 Gy
Plan Total Fractions Prescribed: 15
Plan Total Prescribed Dose: 37.5 Gy
Reference Point Dosage Given to Date: 2.5 Gy
Reference Point Session Dosage Given: 2.5 Gy
Session Number: 1

## 2023-10-08 NOTE — Telephone Encounter (Signed)
 Copied from CRM [id number]. Topic: Clinical - Medication Refill >> Oct 08, 2023  2:24 PM Phil Braun wrote: Most Recent Primary Care Visit:   Medication: tamsulosin (FLOMAX) 0.4 MG CAPS capsule  Has the patient contacted their pharmacy? Yes (Agent: If no, request that the patient contact the pharmacy for the refill. If patient does not wish to contact the pharmacy document the reason why and proceed with request.) (Agent: If yes, when and what did the pharmacy advise?)  Is this the correct pharmacy for this prescription? Yes If no, delete pharmacy and type the correct one.  This is the patient's preferred pharmacy:   [facility] [place], [place] - [address]  [address] [place] [address] Phone: [phone] Fax: [phone]   Has the prescription been filled recently? Yes  Is the patient out of the medication? Yes  Has the patient been seen for an appointment in the last year OR does the patient have an upcoming appointment? Yes  Can we respond through MyChart? Yes  Agent: Please be advised that Rx refills may take up to 3 business days. We ask that you follow-up with your pharmacy.

## 2023-10-09 ENCOUNTER — Inpatient Hospital Stay

## 2023-10-09 ENCOUNTER — Other Ambulatory Visit: Payer: Self-pay

## 2023-10-09 ENCOUNTER — Ambulatory Visit
Admission: RE | Admit: 2023-10-09 | Discharge: 2023-10-09 | Disposition: A | Discharge status: Home / Self Care | Source: Ambulatory Visit | Attending: Radiation Oncology | Admitting: Radiation Oncology

## 2023-10-09 ENCOUNTER — Inpatient Hospital Stay: Admitting: Oncology

## 2023-10-09 ENCOUNTER — Encounter: Payer: Self-pay | Admitting: Oncology

## 2023-10-09 VITALS — BP 124/83 | HR 93 | Temp 98.1°F | Resp 18 | Ht 72.0 in | Wt 241.3 lb

## 2023-10-09 VITALS — BP 121/86 | HR 88

## 2023-10-09 DIAGNOSIS — G620 Drug-induced polyneuropathy: Secondary | ICD-10-CM | POA: Insufficient documentation

## 2023-10-09 DIAGNOSIS — Z803 Family history of malignant neoplasm of breast: Secondary | ICD-10-CM | POA: Insufficient documentation

## 2023-10-09 DIAGNOSIS — Z5111 Encounter for antineoplastic chemotherapy: Secondary | ICD-10-CM | POA: Insufficient documentation

## 2023-10-09 DIAGNOSIS — C20 Malignant neoplasm of rectum: Secondary | ICD-10-CM

## 2023-10-09 DIAGNOSIS — C787 Secondary malignant neoplasm of liver and intrahepatic bile duct: Secondary | ICD-10-CM | POA: Insufficient documentation

## 2023-10-09 DIAGNOSIS — C19 Malignant neoplasm of rectosigmoid junction: Secondary | ICD-10-CM | POA: Insufficient documentation

## 2023-10-09 DIAGNOSIS — Z510 Encounter for antineoplastic radiation therapy: Secondary | ICD-10-CM | POA: Diagnosis not present

## 2023-10-09 DIAGNOSIS — Z95828 Presence of other vascular implants and grafts: Secondary | ICD-10-CM

## 2023-10-09 LAB — CBC WITH DIFFERENTIAL (CANCER CENTER ONLY)
Abs Immature Granulocytes: 0.03 10*3/uL (ref 0.00–0.07)
Basophils Absolute: 0.1 10*3/uL (ref 0.0–0.1)
Basophils Relative: 2 %
Eosinophils Absolute: 0.2 10*3/uL (ref 0.0–0.5)
Eosinophils Relative: 4 %
HCT: 39.4 % (ref 39.0–52.0)
Hemoglobin: 12.8 g/dL — ABNORMAL LOW (ref 13.0–17.0)
Immature Granulocytes: 1 %
Lymphocytes Relative: 10 %
Lymphs Abs: 0.4 10*3/uL — ABNORMAL LOW (ref 0.7–4.0)
MCH: 28.8 pg (ref 26.0–34.0)
MCHC: 32.5 g/dL (ref 30.0–36.0)
MCV: 88.7 fL (ref 80.0–100.0)
Monocytes Absolute: 0.3 10*3/uL (ref 0.1–1.0)
Monocytes Relative: 7 %
Neutro Abs: 3.1 10*3/uL (ref 1.7–7.7)
Neutrophils Relative %: 76 %
Platelet Count: 188 10*3/uL (ref 150–400)
RBC: 4.44 MIL/uL (ref 4.22–5.81)
RDW: 18.6 % — ABNORMAL HIGH (ref 11.5–15.5)
WBC Count: 4 10*3/uL (ref 4.0–10.5)
nRBC: 0.5 % — ABNORMAL HIGH (ref 0.0–0.2)

## 2023-10-09 LAB — RAD ONC ARIA SESSION SUMMARY
Course Elapsed Days: 1
Plan Fractions Treated to Date: 2
Plan Prescribed Dose Per Fraction: 2.5 Gy
Plan Total Fractions Prescribed: 15
Plan Total Prescribed Dose: 37.5 Gy
Reference Point Dosage Given to Date: 5 Gy
Reference Point Session Dosage Given: 2.5 Gy
Session Number: 2

## 2023-10-09 LAB — TOTAL PROTEIN, URINE DIPSTICK: Protein, ur: NEGATIVE mg/dL

## 2023-10-09 MED ORDER — OXYCODONE-ACETAMINOPHEN 5-325 MG PO TABS
1.0000 | ORAL_TABLET | ORAL | 0 refills | Status: DC | PRN
Start: 2023-10-09 — End: 2024-01-02

## 2023-10-09 MED ORDER — SODIUM CHLORIDE 0.9% FLUSH
10.0000 mL | Freq: Once | INTRAVENOUS | Status: AC
  Administered 2023-10-09: 10 mL via INTRAVENOUS

## 2023-10-09 MED ORDER — LONSURF 20-8.19 MG PO TABS: 60.0000 mg | ORAL_TABLET | Freq: Two times a day (BID) | ORAL | 0 refills | Status: DC

## 2023-10-09 MED ORDER — SODIUM CHLORIDE 0.9 % IV SOLN
5.0000 mg/kg | Freq: Once | INTRAVENOUS | Status: AC
  Administered 2023-10-09: 500 mg via INTRAVENOUS
  Filled 2023-10-09: qty 16

## 2023-10-09 MED ORDER — OXYCODONE HCL ER 20 MG PO T12A: 20.0000 mg | EXTENDED_RELEASE_TABLET | Freq: Two times a day (BID) | ORAL | 0 refills | Status: DC

## 2023-10-09 MED ORDER — SODIUM CHLORIDE 0.9% FLUSH
10.0000 mL | INTRAVENOUS | Status: DC | PRN
  Administered 2023-10-09: 10 mL

## 2023-10-09 MED ORDER — TAMSULOSIN HCL 0.4 MG PO CAPS: 0.8000 mg | ORAL_CAPSULE | Freq: Every day | ORAL | 1 refills | Status: DC

## 2023-10-09 MED ORDER — HEPARIN SOD (PORK) LOCK FLUSH 100 UNIT/ML IV SOLN
500.0000 [IU] | Freq: Once | INTRAVENOUS | Status: AC | PRN
  Administered 2023-10-09: 500 [IU]

## 2023-10-09 MED ORDER — SODIUM CHLORIDE 0.9 % IV SOLN: INTRAVENOUS | Status: DC

## 2023-10-09 MED ORDER — LORAZEPAM 1 MG PO TABS: 1.0000 mg | ORAL_TABLET | Freq: Three times a day (TID) | ORAL | 0 refills | Status: DC | PRN

## 2023-10-09 NOTE — Patient Instructions (Signed)
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
## 2023-10-09 NOTE — Progress Notes (Signed)
 This patient stated his Oxycodone is making him drowsy for 45 minutes after he takes it. After 45 minutes he stated he is non-drowsy, denies it interfering with daily activities, work from home. Dr. Enedina Harrow nurse notified.

## 2023-10-09 NOTE — Progress Notes (Signed)
 Patient seen by Dr. Coni Deep today  Vitals are within treatment parameters:Yes   Labs are within treatment parameters: Yes Does not need CMP today  Treatment plan has been signed: Yes   Per physician team, Patient is ready for treatment and there are NO modifications to the treatment plan.

## 2023-10-09 NOTE — Patient Instructions (Signed)
 [facility]  Discharge Instructions: Thank you for choosing [facility] to provide your oncology and hematology care.   If you have a lab appointment with the Cancer Center, please go directly to the Cancer Center and check in at the registration area.   Wear comfortable clothing and clothing appropriate for easy access to any Portacath or PICC line.   We strive to give you quality time with your provider. You may need to reschedule your appointment if you arrive late (15 or more minutes).  Arriving late affects you and other patients whose appointments are after yours.  Also, if you miss three or more appointments without notifying the office, you may be dismissed from the clinic at the provider's discretion.      For prescription refill requests, have your pharmacy contact our office and allow 72 hours for refills to be completed.    Today you received the following chemotherapy and/or immunotherapy agent: Bevacizumab       To help prevent nausea and vomiting after your treatment, we encourage you to take your nausea medication as directed.  BELOW ARE SYMPTOMS THAT SHOULD BE REPORTED IMMEDIATELY: *FEVER GREATER THAN 100.4 F (38 C) OR HIGHER *CHILLS OR SWEATING *NAUSEA AND VOMITING THAT IS NOT CONTROLLED WITH YOUR NAUSEA MEDICATION *UNUSUAL SHORTNESS OF BREATH *UNUSUAL BRUISING OR BLEEDING *URINARY PROBLEMS (pain or burning when urinating, or frequent urination) *BOWEL PROBLEMS (unusual diarrhea, constipation, pain near the anus) TENDERNESS IN MOUTH AND THROAT WITH OR WITHOUT PRESENCE OF ULCERS (sore throat, sores in mouth, or a toothache) UNUSUAL RASH, SWELLING OR PAIN  UNUSUAL VAGINAL DISCHARGE OR ITCHING   Items with * indicate a potential emergency and should be followed up as soon as possible or go to the Emergency Department if any problems should occur.  Please show the CHEMOTHERAPY ALERT CARD or  IMMUNOTHERAPY ALERT CARD at check-in to the Emergency Department and triage nurse.  Should you have questions after your visit or need to cancel or reschedule your appointment, please contact [facility]  Dept: [phone]  and follow the prompts.  Office hours are 8:00 a.m. to 4:30 p.m. Monday - Friday. Please note that voicemails left after 4:00 p.m. may not be returned until the following business day.  We are closed weekends and major holidays. You have access to a nurse at all times for urgent questions. Please call the main number to the clinic Dept: [phone] and follow the prompts.   For any non-urgent questions, you may also contact your provider using MyChart. We now offer e-Visits for anyone 22 and older to request care online for non-urgent symptoms. For details visit [email or web address].   Also download the MyChart app! Go to the app store, search "MyChart", open the app, select [place], and log in with your MyChart username and password.  Bevacizumab Injection What is this medication? BEVACIZUMAB (be va SIZ yoo mab) treats some types of cancer. It works by blocking a protein that causes cancer cells to grow and multiply. This helps to slow or stop the spread of cancer cells. It is a monoclonal antibody. This medicine may be used for other purposes; ask your health care provider or pharmacist if you have questions. COMMON BRAND NAME(S): Alymsys, Avastin, MVASI, Rosaland Lao What should I tell my care team before I take this medication? They need to know if you have any of  these conditions: Blood clots Coughing up blood Having or recent surgery Heart failure High blood pressure History of a connection between 2 or more body parts that do not usually connect (fistula) History of a tear in your stomach or intestines Protein in your urine An unusual or allergic reaction to bevacizumab, other medications, foods,  dyes, or preservatives Pregnant or trying to get pregnant Breast-feeding How should I use this medication? This medication is injected into a vein. It is given by your care team in a hospital or clinic setting. Talk to your care team the use of this medication in children. Special care may be needed. Overdosage: If you think you have taken too much of this medicine contact a poison control center or emergency room at once. NOTE: This medicine is only for you. Do not share this medicine with others. What if I miss a dose? Keep appointments for follow-up doses. It is important not to miss your dose. Call your care team if you are unable to keep an appointment. What may interact with this medication? Interactions are not expected. This list may not describe all possible interactions. Give your health care provider a list of all the medicines, herbs, non-prescription drugs, or dietary supplements you use. Also tell them if you smoke, drink alcohol, or use illegal drugs. Some items may interact with your medicine. What should I watch for while using this medication? Your condition will be monitored carefully while you are receiving this medication. You may need blood work while taking this medication. This medication may make you feel generally unwell. This is not uncommon as chemotherapy can affect healthy cells as well as cancer cells. Report any side effects. Continue your course of treatment even though you feel ill unless your care team tells you to stop. This medication may increase your risk to bruise or bleed. Call your care team if you notice any unusual bleeding. Before having surgery, talk to your care team to make sure it is ok. This medication can increase the risk of poor healing of your surgical site or wound. You will need to stop this medication for 28 days before surgery. After surgery, wait at least 28 days before restarting this medication. Make sure the surgical site or wound is  healed enough before restarting this medication. Talk to your care team if questions. Talk to your care team if you may be pregnant. Serious birth defects can occur if you take this medication during pregnancy and for 6 months after the last dose. Contraception is recommended while taking this medication and for 6 months after the last dose. Your care team can help you find the option that works for you. Do not breastfeed while taking this medication and for 6 months after the last dose. This medication can cause infertility. Talk to your care team if you are concerned about your fertility. What side effects may I notice from receiving this medication? Side effects that you should report to your care team as soon as possible: Allergic reactions--skin rash, itching, hives, swelling of the face, lips, tongue, or throat Bleeding--bloody or black, tar-like stools, vomiting blood or brown material that looks like coffee grounds, red or dark brown urine, small red or purple spots on skin, unusual bruising or bleeding Blood clot--pain, swelling, or warmth in the leg, shortness of breath, chest pain Heart attack--pain or tightness in the chest, shoulders, arms, or jaw, nausea, shortness of breath, cold or clammy skin, feeling faint or lightheaded Heart failure--shortness of breath, swelling  of the ankles, feet, or hands, sudden weight gain, unusual weakness or fatigue Increase in blood pressure Infection--fever, chills, cough, sore throat, wounds that don't heal, pain or trouble when passing urine, general feeling of discomfort or being unwell Infusion reactions--chest pain, shortness of breath or trouble breathing, feeling faint or lightheaded Kidney injury--decrease in the amount of urine, swelling of the ankles, hands, or feet Stomach pain that is severe, does not go away, or gets worse Stroke--sudden numbness or weakness of the face, arm, or leg, trouble speaking, confusion, trouble walking, loss of  balance or coordination, dizziness, severe headache, change in vision Sudden and severe headache, confusion, change in vision, seizures, which may be signs of posterior reversible encephalopathy syndrome (PRES) Side effects that usually do not require medical attention (report to your care team if they continue or are bothersome): Back pain Change in taste Diarrhea Dry skin Increased tears Nosebleed This list may not describe all possible side effects. Call your doctor for medical advice about side effects. You may report side effects to FDA at 1-800-FDA-1088. Where should I keep my medication? This medication is given in a hospital or clinic. It will not be stored at home. NOTE: This sheet is a summary. It may not cover all possible information. If you have questions about this medicine, talk to your doctor, pharmacist, or health care provider.  2024 Elsevier/Gold Standard (2021-10-28 00:00:00)

## 2023-10-10 ENCOUNTER — Other Ambulatory Visit: Payer: Self-pay

## 2023-10-10 ENCOUNTER — Ambulatory Visit
Admission: RE | Admit: 2023-10-10 | Discharge: 2023-10-10 | Disposition: A | Discharge status: Home / Self Care | Source: Ambulatory Visit | Attending: Radiation Oncology | Admitting: Radiation Oncology

## 2023-10-10 DIAGNOSIS — Z510 Encounter for antineoplastic radiation therapy: Secondary | ICD-10-CM | POA: Diagnosis not present

## 2023-10-10 LAB — RAD ONC ARIA SESSION SUMMARY
Course Elapsed Days: 2
Plan Fractions Treated to Date: 3
Plan Prescribed Dose Per Fraction: 2.5 Gy
Plan Total Fractions Prescribed: 15
Plan Total Prescribed Dose: 37.5 Gy
Reference Point Dosage Given to Date: 7.5 Gy
Reference Point Session Dosage Given: 2.5 Gy
Session Number: 3

## 2023-10-11 ENCOUNTER — Other Ambulatory Visit: Payer: Self-pay

## 2023-10-11 ENCOUNTER — Ambulatory Visit
Admission: RE | Admit: 2023-10-11 | Discharge: 2023-10-11 | Disposition: A | Discharge status: Home / Self Care | Source: Ambulatory Visit | Attending: Radiation Oncology | Admitting: Radiation Oncology

## 2023-10-11 DIAGNOSIS — N183 Chronic kidney disease, stage 3 unspecified: Secondary | ICD-10-CM

## 2023-10-11 DIAGNOSIS — K219 Gastro-esophageal reflux disease without esophagitis: Secondary | ICD-10-CM

## 2023-10-11 DIAGNOSIS — Z510 Encounter for antineoplastic radiation therapy: Secondary | ICD-10-CM | POA: Diagnosis not present

## 2023-10-11 LAB — RAD ONC ARIA SESSION SUMMARY
Course Elapsed Days: 3
Plan Fractions Treated to Date: 4
Plan Prescribed Dose Per Fraction: 2.5 Gy
Plan Total Fractions Prescribed: 15
Plan Total Prescribed Dose: 37.5 Gy
Reference Point Dosage Given to Date: 10 Gy
Reference Point Session Dosage Given: 2.5 Gy
Session Number: 4

## 2023-10-11 MED ORDER — LEVOCETIRIZINE DIHYDROCHLORIDE 5 MG PO TABS
5.0000 mg | ORAL_TABLET | Freq: Every evening | ORAL | 2 refills | Status: DC
Start: 2023-10-11 — End: 2024-01-02

## 2023-10-11 MED ORDER — METFORMIN HCL 500 MG PO TABS: 500.0000 mg | ORAL_TABLET | Freq: Two times a day (BID) | ORAL | 1 refills | Status: DC

## 2023-10-11 MED ORDER — OMEPRAZOLE 40 MG PO CPDR: 40.0000 mg | DELAYED_RELEASE_CAPSULE | Freq: Every day | ORAL | 2 refills | Status: DC | PRN

## 2023-10-12 ENCOUNTER — Ambulatory Visit
Admission: RE | Admit: 2023-10-12 | Discharge: 2023-10-12 | Disposition: A | Discharge status: Home / Self Care | Source: Ambulatory Visit | Attending: Radiation Oncology | Admitting: Radiation Oncology

## 2023-10-12 ENCOUNTER — Other Ambulatory Visit: Payer: Self-pay

## 2023-10-12 DIAGNOSIS — Z510 Encounter for antineoplastic radiation therapy: Secondary | ICD-10-CM | POA: Diagnosis not present

## 2023-10-12 LAB — RAD ONC ARIA SESSION SUMMARY
Course Elapsed Days: 4
Plan Fractions Treated to Date: 5
Plan Prescribed Dose Per Fraction: 2.5 Gy
Plan Total Fractions Prescribed: 15
Plan Total Prescribed Dose: 37.5 Gy
Reference Point Dosage Given to Date: 12.5 Gy
Reference Point Session Dosage Given: 2.5 Gy
Session Number: 5

## 2023-10-15 ENCOUNTER — Ambulatory Visit
Admission: RE | Admit: 2023-10-15 | Discharge: 2023-10-15 | Disposition: A | Discharge status: Home / Self Care | Source: Ambulatory Visit | Attending: Radiation Oncology | Admitting: Radiation Oncology

## 2023-10-15 ENCOUNTER — Other Ambulatory Visit: Payer: Self-pay

## 2023-10-15 DIAGNOSIS — Z510 Encounter for antineoplastic radiation therapy: Secondary | ICD-10-CM | POA: Diagnosis not present

## 2023-10-15 LAB — RAD ONC ARIA SESSION SUMMARY
Course Elapsed Days: 7
Plan Fractions Treated to Date: 6
Plan Prescribed Dose Per Fraction: 2.5 Gy
Plan Total Fractions Prescribed: 15
Plan Total Prescribed Dose: 37.5 Gy
Reference Point Dosage Given to Date: 15 Gy
Reference Point Session Dosage Given: 2.5 Gy
Session Number: 6

## 2023-10-16 ENCOUNTER — Ambulatory Visit
Admission: RE | Admit: 2023-10-16 | Discharge: 2023-10-16 | Disposition: A | Discharge status: Home / Self Care | Source: Ambulatory Visit | Attending: Radiation Oncology | Admitting: Radiation Oncology

## 2023-10-16 ENCOUNTER — Other Ambulatory Visit: Payer: Self-pay

## 2023-10-16 DIAGNOSIS — Z510 Encounter for antineoplastic radiation therapy: Secondary | ICD-10-CM | POA: Diagnosis not present

## 2023-10-16 LAB — RAD ONC ARIA SESSION SUMMARY
Course Elapsed Days: 8
Plan Fractions Treated to Date: 7
Plan Prescribed Dose Per Fraction: 2.5 Gy
Plan Total Fractions Prescribed: 15
Plan Total Prescribed Dose: 37.5 Gy
Reference Point Dosage Given to Date: 17.5 Gy
Reference Point Session Dosage Given: 2.5 Gy
Session Number: 7

## 2023-10-17 ENCOUNTER — Ambulatory Visit
Admission: RE | Admit: 2023-10-17 | Discharge: 2023-10-17 | Disposition: A | Discharge status: Home / Self Care | Source: Ambulatory Visit | Attending: Radiation Oncology | Admitting: Radiation Oncology

## 2023-10-17 ENCOUNTER — Other Ambulatory Visit: Payer: Self-pay

## 2023-10-17 DIAGNOSIS — Z510 Encounter for antineoplastic radiation therapy: Secondary | ICD-10-CM | POA: Diagnosis not present

## 2023-10-17 LAB — RAD ONC ARIA SESSION SUMMARY
Course Elapsed Days: 9
Plan Fractions Treated to Date: 8
Plan Prescribed Dose Per Fraction: 2.5 Gy
Plan Total Fractions Prescribed: 15
Plan Total Prescribed Dose: 37.5 Gy
Reference Point Dosage Given to Date: 20 Gy
Reference Point Session Dosage Given: 2.5 Gy
Session Number: 8

## 2023-10-18 ENCOUNTER — Other Ambulatory Visit: Payer: Self-pay

## 2023-10-18 ENCOUNTER — Ambulatory Visit
Admission: RE | Admit: 2023-10-18 | Discharge: 2023-10-18 | Disposition: A | Discharge status: Home / Self Care | Source: Ambulatory Visit | Attending: Radiation Oncology | Admitting: Radiation Oncology

## 2023-10-18 DIAGNOSIS — Z510 Encounter for antineoplastic radiation therapy: Secondary | ICD-10-CM | POA: Diagnosis not present

## 2023-10-18 LAB — RAD ONC ARIA SESSION SUMMARY
Course Elapsed Days: 10
Plan Fractions Treated to Date: 9
Plan Prescribed Dose Per Fraction: 2.5 Gy
Plan Total Fractions Prescribed: 15
Plan Total Prescribed Dose: 37.5 Gy
Reference Point Dosage Given to Date: 22.5 Gy
Reference Point Session Dosage Given: 2.5 Gy
Session Number: 9

## 2023-10-19 ENCOUNTER — Ambulatory Visit
Admission: RE | Admit: 2023-10-19 | Discharge: 2023-10-19 | Disposition: A | Discharge status: Home / Self Care | Source: Ambulatory Visit | Attending: Radiation Oncology | Admitting: Radiation Oncology

## 2023-10-19 ENCOUNTER — Other Ambulatory Visit: Payer: Self-pay

## 2023-10-19 DIAGNOSIS — Z510 Encounter for antineoplastic radiation therapy: Secondary | ICD-10-CM | POA: Diagnosis not present

## 2023-10-19 LAB — RAD ONC ARIA SESSION SUMMARY
Course Elapsed Days: 11
Plan Fractions Treated to Date: 10
Plan Prescribed Dose Per Fraction: 2.5 Gy
Plan Total Fractions Prescribed: 15
Plan Total Prescribed Dose: 37.5 Gy
Reference Point Dosage Given to Date: 25 Gy
Reference Point Session Dosage Given: 2.5 Gy
Session Number: 10

## 2023-10-22 ENCOUNTER — Encounter: Payer: Self-pay | Admitting: Nurse Practitioner

## 2023-10-22 ENCOUNTER — Inpatient Hospital Stay: Admitting: Nurse Practitioner

## 2023-10-22 ENCOUNTER — Ambulatory Visit
Admission: RE | Admit: 2023-10-22 | Discharge: 2023-10-22 | Disposition: A | Discharge status: Home / Self Care | Source: Ambulatory Visit | Attending: Radiation Oncology

## 2023-10-22 ENCOUNTER — Other Ambulatory Visit: Payer: Self-pay

## 2023-10-22 ENCOUNTER — Inpatient Hospital Stay

## 2023-10-22 VITALS — BP 124/89 | HR 100 | Temp 98.1°F | Resp 18 | Ht 72.0 in | Wt 237.0 lb

## 2023-10-22 DIAGNOSIS — Z510 Encounter for antineoplastic radiation therapy: Secondary | ICD-10-CM | POA: Diagnosis not present

## 2023-10-22 DIAGNOSIS — C20 Malignant neoplasm of rectum: Secondary | ICD-10-CM

## 2023-10-22 DIAGNOSIS — Z95828 Presence of other vascular implants and grafts: Secondary | ICD-10-CM

## 2023-10-22 LAB — CBC WITH DIFFERENTIAL (CANCER CENTER ONLY)
Abs Immature Granulocytes: 0.01 10*3/uL (ref 0.00–0.07)
Basophils Absolute: 0 10*3/uL (ref 0.0–0.1)
Basophils Relative: 1 %
Eosinophils Absolute: 0.1 10*3/uL (ref 0.0–0.5)
Eosinophils Relative: 5 %
HCT: 39.1 % (ref 39.0–52.0)
Hemoglobin: 12.6 g/dL — ABNORMAL LOW (ref 13.0–17.0)
Immature Granulocytes: 1 %
Lymphocytes Relative: 10 %
Lymphs Abs: 0.2 10*3/uL — ABNORMAL LOW (ref 0.7–4.0)
MCH: 29.8 pg (ref 26.0–34.0)
MCHC: 32.2 g/dL (ref 30.0–36.0)
MCV: 92.4 fL (ref 80.0–100.0)
Monocytes Absolute: 0.3 10*3/uL (ref 0.1–1.0)
Monocytes Relative: 15 %
Neutro Abs: 1.2 10*3/uL — ABNORMAL LOW (ref 1.7–7.7)
Neutrophils Relative %: 68 %
Platelet Count: 145 10*3/uL — ABNORMAL LOW (ref 150–400)
RBC: 4.23 MIL/uL (ref 4.22–5.81)
RDW: 19.8 % — ABNORMAL HIGH (ref 11.5–15.5)
WBC Count: 1.8 10*3/uL — ABNORMAL LOW (ref 4.0–10.5)
nRBC: 0 % (ref 0.0–0.2)

## 2023-10-22 LAB — CMP (CANCER CENTER ONLY)
ALT: 193 U/L — ABNORMAL HIGH (ref 0–44)
AST: 104 U/L — ABNORMAL HIGH (ref 15–41)
Albumin: 4.5 g/dL (ref 3.5–5.0)
Alkaline Phosphatase: 248 U/L — ABNORMAL HIGH (ref 38–126)
Anion gap: 12 (ref 5–15)
BUN: 12 mg/dL (ref 8–23)
CO2: 21 mmol/L — ABNORMAL LOW (ref 22–32)
Calcium: 9.1 mg/dL (ref 8.9–10.3)
Chloride: 103 mmol/L (ref 98–111)
Creatinine: 0.89 mg/dL (ref 0.61–1.24)
GFR, Estimated: >60 mL/min (ref >60)
Glucose, Bld: 196 mg/dL — ABNORMAL HIGH (ref 70–99)
Potassium: 4.4 mmol/L (ref 3.5–5.1)
Sodium: 136 mmol/L (ref 135–145)
Total Bilirubin: 1.5 mg/dL — ABNORMAL HIGH (ref 0.0–1.2)
Total Protein: 6.5 g/dL (ref 6.5–8.1)

## 2023-10-22 LAB — RAD ONC ARIA SESSION SUMMARY
Course Elapsed Days: 14
Plan Fractions Treated to Date: 11
Plan Prescribed Dose Per Fraction: 2.5 Gy
Plan Total Fractions Prescribed: 15
Plan Total Prescribed Dose: 37.5 Gy
Reference Point Dosage Given to Date: 27.5 Gy
Reference Point Session Dosage Given: 2.5 Gy
Session Number: 11

## 2023-10-22 LAB — CEA (ACCESS): CEA (CHCC): 46.82 ng/mL — ABNORMAL HIGH (ref 0.00–5.00)

## 2023-10-22 MED ORDER — HEPARIN SOD (PORK) LOCK FLUSH 100 UNIT/ML IV SOLN
500.0000 [IU] | Freq: Once | INTRAVENOUS | Status: AC
Start: 2023-10-22 — End: 2023-10-22
  Administered 2023-10-22: 500 [IU] via INTRAVENOUS

## 2023-10-22 MED ORDER — SODIUM CHLORIDE 0.9% FLUSH
10.0000 mL | INTRAVENOUS | Status: AC | PRN
  Administered 2023-10-22: 10 mL via INTRAVENOUS
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
## 2023-10-22 NOTE — Patient Instructions (Signed)

## 2023-10-22 NOTE — Addendum Note (Signed)
 Addended by: Rozann Holts S on: 10/22/2023 01:20 PM   Modules accepted: Orders

## 2023-10-23 ENCOUNTER — Ambulatory Visit: Admitting: Nurse Practitioner

## 2023-10-23 ENCOUNTER — Other Ambulatory Visit

## 2023-10-23 ENCOUNTER — Ambulatory Visit

## 2023-10-23 ENCOUNTER — Other Ambulatory Visit: Payer: Self-pay

## 2023-10-23 ENCOUNTER — Ambulatory Visit
Admission: RE | Admit: 2023-10-23 | Discharge: 2023-10-23 | Disposition: A | Discharge status: Home / Self Care | Source: Ambulatory Visit | Attending: Radiation Oncology

## 2023-10-23 DIAGNOSIS — Z510 Encounter for antineoplastic radiation therapy: Secondary | ICD-10-CM | POA: Diagnosis not present

## 2023-10-23 LAB — RAD ONC ARIA SESSION SUMMARY
Course Elapsed Days: 15
Plan Fractions Treated to Date: 12
Plan Prescribed Dose Per Fraction: 2.5 Gy
Plan Total Fractions Prescribed: 15
Plan Total Prescribed Dose: 37.5 Gy
Reference Point Dosage Given to Date: 30 Gy
Reference Point Session Dosage Given: 2.5 Gy
Session Number: 12

## 2023-10-24 ENCOUNTER — Ambulatory Visit
Admission: RE | Admit: 2023-10-24 | Discharge: 2023-10-24 | Disposition: A | Discharge status: Home / Self Care | Source: Ambulatory Visit | Attending: Radiation Oncology | Admitting: Radiation Oncology

## 2023-10-24 ENCOUNTER — Other Ambulatory Visit: Payer: Self-pay

## 2023-10-24 DIAGNOSIS — Z510 Encounter for antineoplastic radiation therapy: Secondary | ICD-10-CM | POA: Diagnosis not present

## 2023-10-24 LAB — RAD ONC ARIA SESSION SUMMARY
Course Elapsed Days: 16
Plan Fractions Treated to Date: 13
Plan Prescribed Dose Per Fraction: 2.5 Gy
Plan Total Fractions Prescribed: 15
Plan Total Prescribed Dose: 37.5 Gy
Reference Point Dosage Given to Date: 32.5 Gy
Reference Point Session Dosage Given: 2.5 Gy
Session Number: 13

## 2023-10-25 ENCOUNTER — Other Ambulatory Visit: Payer: Self-pay

## 2023-10-25 ENCOUNTER — Ambulatory Visit
Admission: RE | Admit: 2023-10-25 | Discharge: 2023-10-25 | Disposition: A | Discharge status: Home / Self Care | Source: Ambulatory Visit | Attending: Radiation Oncology | Admitting: Radiation Oncology

## 2023-10-25 DIAGNOSIS — Z800 Family history of malignant neoplasm of digestive organs: Secondary | ICD-10-CM | POA: Insufficient documentation

## 2023-10-25 DIAGNOSIS — G620 Drug-induced polyneuropathy: Secondary | ICD-10-CM | POA: Insufficient documentation

## 2023-10-25 DIAGNOSIS — C20 Malignant neoplasm of rectum: Secondary | ICD-10-CM | POA: Diagnosis not present

## 2023-10-25 DIAGNOSIS — C787 Secondary malignant neoplasm of liver and intrahepatic bile duct: Secondary | ICD-10-CM | POA: Insufficient documentation

## 2023-10-25 DIAGNOSIS — G8929 Other chronic pain: Secondary | ICD-10-CM | POA: Insufficient documentation

## 2023-10-25 DIAGNOSIS — Z5111 Encounter for antineoplastic chemotherapy: Secondary | ICD-10-CM | POA: Insufficient documentation

## 2023-10-25 DIAGNOSIS — Z510 Encounter for antineoplastic radiation therapy: Secondary | ICD-10-CM | POA: Insufficient documentation

## 2023-10-25 DIAGNOSIS — C7951 Secondary malignant neoplasm of bone: Secondary | ICD-10-CM | POA: Diagnosis present

## 2023-10-25 DIAGNOSIS — M549 Dorsalgia, unspecified: Secondary | ICD-10-CM | POA: Insufficient documentation

## 2023-10-25 DIAGNOSIS — C19 Malignant neoplasm of rectosigmoid junction: Secondary | ICD-10-CM | POA: Insufficient documentation

## 2023-10-25 DIAGNOSIS — Z803 Family history of malignant neoplasm of breast: Secondary | ICD-10-CM | POA: Insufficient documentation

## 2023-10-25 LAB — RAD ONC ARIA SESSION SUMMARY
Course Elapsed Days: 17
Plan Fractions Treated to Date: 14
Plan Prescribed Dose Per Fraction: 2.5 Gy
Plan Total Fractions Prescribed: 15
Plan Total Prescribed Dose: 37.5 Gy
Reference Point Dosage Given to Date: 35 Gy
Reference Point Session Dosage Given: 2.5 Gy
Session Number: 14

## 2023-10-26 ENCOUNTER — Ambulatory Visit
Admission: RE | Admit: 2023-10-26 | Discharge: 2023-10-26 | Disposition: A | Discharge status: Home / Self Care | Source: Ambulatory Visit | Attending: Radiation Oncology

## 2023-10-26 ENCOUNTER — Inpatient Hospital Stay: Attending: Oncology

## 2023-10-26 ENCOUNTER — Other Ambulatory Visit: Payer: Self-pay

## 2023-10-26 DIAGNOSIS — C7951 Secondary malignant neoplasm of bone: Secondary | ICD-10-CM | POA: Insufficient documentation

## 2023-10-26 DIAGNOSIS — C20 Malignant neoplasm of rectum: Secondary | ICD-10-CM | POA: Insufficient documentation

## 2023-10-26 LAB — CMP (CANCER CENTER ONLY)
ALT: 170 U/L — ABNORMAL HIGH (ref 0–44)
AST: 83 U/L — ABNORMAL HIGH (ref 15–41)
Albumin: 4.2 g/dL (ref 3.5–5.0)
Alkaline Phosphatase: 297 U/L — ABNORMAL HIGH (ref 38–126)
Anion gap: 8 (ref 5–15)
BUN: 16 mg/dL (ref 8–23)
CO2: 23 mmol/L (ref 22–32)
Calcium: 9.1 mg/dL (ref 8.9–10.3)
Chloride: 104 mmol/L (ref 98–111)
Creatinine: 0.88 mg/dL (ref 0.61–1.24)
GFR, Estimated: >60 mL/min (ref >60)
Glucose, Bld: 193 mg/dL — ABNORMAL HIGH (ref 70–99)
Potassium: 4.3 mmol/L (ref 3.5–5.1)
Sodium: 135 mmol/L (ref 135–145)
Total Bilirubin: 1.9 mg/dL — ABNORMAL HIGH (ref 0.0–1.2)
Total Protein: 7.4 g/dL (ref 6.5–8.1)

## 2023-10-26 LAB — CBC WITH DIFFERENTIAL (CANCER CENTER ONLY)
Abs Immature Granulocytes: 0.01 10*3/uL (ref 0.00–0.07)
Basophils Absolute: 0 10*3/uL (ref 0.0–0.1)
Basophils Relative: 2 %
Eosinophils Absolute: 0.1 10*3/uL (ref 0.0–0.5)
Eosinophils Relative: 5 %
HCT: 38.1 % — ABNORMAL LOW (ref 39.0–52.0)
Hemoglobin: 12.5 g/dL — ABNORMAL LOW (ref 13.0–17.0)
Immature Granulocytes: 1 %
Lymphocytes Relative: 6 %
Lymphs Abs: 0.1 10*3/uL — ABNORMAL LOW (ref 0.7–4.0)
MCH: 30.2 pg (ref 26.0–34.0)
MCHC: 32.8 g/dL (ref 30.0–36.0)
MCV: 92 fL (ref 80.0–100.0)
Monocytes Absolute: 0.3 10*3/uL (ref 0.1–1.0)
Monocytes Relative: 15 %
Neutro Abs: 1.5 10*3/uL — ABNORMAL LOW (ref 1.7–7.7)
Neutrophils Relative %: 71 %
Platelet Count: 146 10*3/uL — ABNORMAL LOW (ref 150–400)
RBC: 4.14 MIL/uL — ABNORMAL LOW (ref 4.22–5.81)
RDW: 19.8 % — ABNORMAL HIGH (ref 11.5–15.5)
WBC Count: 2 10*3/uL — ABNORMAL LOW (ref 4.0–10.5)
nRBC: 0 % (ref 0.0–0.2)

## 2023-10-26 LAB — RAD ONC ARIA SESSION SUMMARY
Course Elapsed Days: 18
Plan Fractions Treated to Date: 15
Plan Prescribed Dose Per Fraction: 2.5 Gy
Plan Total Fractions Prescribed: 15
Plan Total Prescribed Dose: 37.5 Gy
Reference Point Dosage Given to Date: 37.5 Gy
Reference Point Session Dosage Given: 2.5 Gy
Session Number: 15

## 2023-10-29 ENCOUNTER — Telehealth: Payer: Self-pay

## 2023-10-29 NOTE — Telephone Encounter (Signed)
 Patient gave verbal understanding and had no further questions or concerns

## 2023-10-29 NOTE — Radiation Completion Notes (Addendum)
  Radiation Oncology         [phone] ________________________________  Name: [name] [name] MRN: [id number]  Date of Service: 10/26/2023  DOB: 1960-11-10  End of Treatment Note    Diagnosis: Stage IV rectal cancer with bone metastasis   Intent: Palliative     ==========DELIVERED PLANS==========  First Treatment Date: 2023-10-08 Last Treatment Date: 2023-10-26   Plan Name: Spine_T-L Site: Lumbar Spine Technique: 3D Mode: Photon Dose Per Fraction: 2.5 Gy Prescribed Dose (Delivered / Prescribed): 37.5 Gy / 37.5 Gy Prescribed Fxs (Delivered / Prescribed): 15 / 15     ==========ON TREATMENT VISIT DATES========== 2023-10-12, 2023-10-19, 2023-10-26    See weekly On Treatment Notes in Epic for details in the Media tab (listed as Progress notes on the On Treatment Visit Dates listed above). The patient tolerated radiation. He developed fatigue but noted significant pain improvement at the completion of his treatment.  The patient will receive a call in about one month from the radiation oncology department. He will continue follow up with Dr. Scherrie Curt as well.      Shelvia Dick, PAC

## 2023-10-29 NOTE — Telephone Encounter (Signed)
-----   Message from Diana Forster sent at 10/29/2023  9:23 AM EDT ----- Please let him know the neutrophil count is better but overall remains low.  Liver tests remain abnormal. Hold Lonsurf .  Follow-up as scheduled.

## 2023-10-31 ENCOUNTER — Other Ambulatory Visit: Payer: Self-pay

## 2023-10-31 ENCOUNTER — Encounter: Payer: Self-pay | Admitting: Family

## 2023-10-31 DIAGNOSIS — E1122 Type 2 diabetes mellitus with diabetic chronic kidney disease: Secondary | ICD-10-CM

## 2023-10-31 DIAGNOSIS — E1169 Type 2 diabetes mellitus with other specified complication: Secondary | ICD-10-CM

## 2023-10-31 DIAGNOSIS — J301 Allergic rhinitis due to pollen: Secondary | ICD-10-CM

## 2023-10-31 MED ORDER — TRULICITY 1.5 MG/0.5ML ~~LOC~~ SOAJ: 1.5000 mg | SUBCUTANEOUS | 2 refills | Status: DC

## 2023-10-31 MED ORDER — MONTELUKAST SODIUM 10 MG PO TABS: 10.0000 mg | ORAL_TABLET | Freq: Every day | ORAL | 1 refills | Status: DC

## 2023-10-31 MED ORDER — EMPAGLIFLOZIN 25 MG PO TABS: 25.0000 mg | ORAL_TABLET | Freq: Every day | ORAL | 0 refills | Status: DC

## 2023-11-04 ENCOUNTER — Other Ambulatory Visit: Payer: Self-pay | Admitting: Oncology

## 2023-11-04 DIAGNOSIS — C20 Malignant neoplasm of rectum: Secondary | ICD-10-CM

## 2023-11-05 ENCOUNTER — Telehealth: Payer: Self-pay | Admitting: *Deleted

## 2023-11-05 ENCOUNTER — Inpatient Hospital Stay: Admitting: Nurse Practitioner

## 2023-11-05 ENCOUNTER — Inpatient Hospital Stay

## 2023-11-05 ENCOUNTER — Other Ambulatory Visit: Payer: Self-pay | Admitting: *Deleted

## 2023-11-05 ENCOUNTER — Encounter: Payer: Self-pay | Admitting: Oncology

## 2023-11-05 DIAGNOSIS — C20 Malignant neoplasm of rectum: Secondary | ICD-10-CM

## 2023-11-05 NOTE — Telephone Encounter (Signed)
 Returned call after Mrs. Mcdermott left message they would like to return his unused Lonsurf  since he has been admitted to Hospice care with [facility] in [place].. They expressed appreciation for all Dr. Scherrie Curt and staff did for them and do not want to schedule an appointment at this time, but just be on prn basis. She will drop off the Lonsurf  when she is able.

## 2023-11-21 ENCOUNTER — Ambulatory Visit: Admitting: Nurse Practitioner

## 2023-11-21 ENCOUNTER — Other Ambulatory Visit

## 2023-11-21 ENCOUNTER — Ambulatory Visit

## 2023-11-23 ENCOUNTER — Telehealth: Payer: Self-pay | Admitting: *Deleted

## 2023-11-23 NOTE — Progress Notes (Addendum)
  Radiation Oncology         [phone] ________________________________  Name: [name] [name] MRN: [id number]  Date of Service: 11/23/2023  DOB: 1961-02-21  Post Treatment Telephone Note  Diagnosis:  Stage IV rectal cancer with bone metastasis (as documented in provider EOT note)  The patient was available for call today.  The patient did note fatigue during radiation. The patient did not note skin changes in the field of radiation during therapy. The patient has NOT noticed improvement in pain in the area(s) treated with radiation. Relief was very shot-lived post Rad TX. Patient is having severe back pain 9/10 and is slowly losing his ability to walk. The patient is not taking dexamethasone . The patient does not have symptoms of  weakness or loss of control of the extremities. The patient does not have symptoms of headache. The patient does not have symptoms of seizure or uncontrolled movement. The patient does not have symptoms of changes in vision. The patient does not have changes in speech. The patient does not have confusion.   The patient is NOT scheduled for ongoing care with Dr. Scherrie Curt in medical oncology. The patient was encouraged to call if he  develops concerns or questions regarding radiation.   Dr. Scherrie Curt has been notified of this PT note and the need for a f/u apt via inbasket message.  This concludes the interaction.  Avery Bodo, LPN

## 2023-11-23 NOTE — Telephone Encounter (Signed)
 Staff message from radiation oncology nurse found at 1644 today by Dr. Scherrie Curt and reported to Diana Forster, NP. Patient had reported to nurse he was having severe back pain and trouble walking. This RN called [name] [name] and he reports the back pain had improved much with RT, but has returned in full force and hurts to ambulate. If he sits still, he is OK. Is able to ambulate, denies any numbness in legs or weakness. Has control of bowel and bladder. Asking if he can have more RT to area? Inquired about Hospice and he reports they rescinded the hospice care.  Adds that [facility] provider placed bile duct stents on 5/16 and he is much better in regards to his nausea and jaundice.  Instructed him to go to emergency room (he declines). Explained that if he develops more pain or above s/s of cord compression it is an oncologic emergency and if not treated he could be paralyzed. He understands and agrees if symptoms appear he will go. Currently he wants to find out Monday if RT can see him and treat him more? Informed him nurse will reach out to Dr. Jeryl Moris on Monday and call him to check on him.

## 2023-11-26 ENCOUNTER — Ambulatory Visit
Admission: RE | Admit: 2023-11-26 | Discharge: 2023-11-26 | Disposition: A | Discharge status: Home / Self Care | Source: Ambulatory Visit | Attending: Radiation Oncology | Admitting: Radiation Oncology

## 2023-11-26 ENCOUNTER — Telehealth: Payer: Self-pay | Admitting: *Deleted

## 2023-11-26 ENCOUNTER — Telehealth: Payer: Self-pay | Admitting: Radiation Oncology

## 2023-11-26 DIAGNOSIS — Z800 Family history of malignant neoplasm of digestive organs: Secondary | ICD-10-CM | POA: Insufficient documentation

## 2023-11-26 DIAGNOSIS — G620 Drug-induced polyneuropathy: Secondary | ICD-10-CM | POA: Insufficient documentation

## 2023-11-26 DIAGNOSIS — Z803 Family history of malignant neoplasm of breast: Secondary | ICD-10-CM | POA: Insufficient documentation

## 2023-11-26 DIAGNOSIS — Z5111 Encounter for antineoplastic chemotherapy: Secondary | ICD-10-CM | POA: Insufficient documentation

## 2023-11-26 DIAGNOSIS — G8929 Other chronic pain: Secondary | ICD-10-CM | POA: Insufficient documentation

## 2023-11-26 DIAGNOSIS — C19 Malignant neoplasm of rectosigmoid junction: Secondary | ICD-10-CM | POA: Insufficient documentation

## 2023-11-26 DIAGNOSIS — Z510 Encounter for antineoplastic radiation therapy: Secondary | ICD-10-CM | POA: Insufficient documentation

## 2023-11-26 DIAGNOSIS — C787 Secondary malignant neoplasm of liver and intrahepatic bile duct: Secondary | ICD-10-CM | POA: Insufficient documentation

## 2023-11-26 DIAGNOSIS — M549 Dorsalgia, unspecified: Secondary | ICD-10-CM | POA: Insufficient documentation

## 2023-11-26 MED ORDER — METHYLPREDNISOLONE 4 MG PO TABS
ORAL_TABLET | ORAL | 0 refills | Status: DC
Start: 2023-11-26 — End: 2024-01-02

## 2023-11-26 NOTE — Telephone Encounter (Signed)
 Called [name] [name] to f/u on his pain over weekend. Says it is controlled when resting--the pain is positional, but he is OK for now. Denies any loss of bowel or bladder control or tingling/numbness in legs. Sent message at his request to inquire if more RT would be possible.

## 2023-11-26 NOTE — Telephone Encounter (Signed)
 I spoke with the patient and his wife by phone. There was misunderstanding by a call from our department last week about his post treamtent course. He had improvement in his pain in his low back initially after his radiation but has developed return of his symptoms in the two weeks following his therapy. He's not had any loss of sensation, difficulty with bowel or bladder activity, or new sites of pain. He had described difficulty walking initially on the call, but admits this was from pain in taking a step rather than neurologic limitations. He has recently had biliary tract stenting but prior to this was not a candidate for additional therapy at [facility], and does not have any med onc follow up scheduled. He reports he has been taking oxycodone  ER 20 mg BID and 1-2 times per day is taking short acting oxycodone  but describes 9/10 pain that keeps him from moving around due to pain when he takes steps. We discussed risks and benefits of steroid dose pack to try to see if this would improve his symptoms, and he is open to this knowing the effects it can have on his blood sugar. A new rx was sent for a medrol dosepack to his pharmacy. We will check with him on Wednesday, but he needs ongoing follow up with Dr. [facility]ed[facility] since he's not on systemic therapy but not sure of what options he may have.

## 2023-11-27 ENCOUNTER — Encounter: Payer: Self-pay | Admitting: *Deleted

## 2023-11-27 ENCOUNTER — Other Ambulatory Visit: Payer: Self-pay | Admitting: *Deleted

## 2023-11-27 DIAGNOSIS — C20 Malignant neoplasm of rectum: Secondary | ICD-10-CM

## 2023-11-28 ENCOUNTER — Telehealth: Payer: Self-pay

## 2023-11-28 NOTE — Telephone Encounter (Signed)
 Call placed to patient to check his status since starting medrol dose pack. Patient reports mediation has been effective the last two days. Blood sugars have been normal. Denies any other issues at this time. Patient to call office with any issues or concerns.

## 2023-12-04 ENCOUNTER — Encounter: Payer: Self-pay | Admitting: Nurse Practitioner

## 2023-12-04 NOTE — Telephone Encounter (Signed)
 They would like to do a phone visit with Dr Scherrie Curt

## 2023-12-06 ENCOUNTER — Other Ambulatory Visit

## 2023-12-06 ENCOUNTER — Ambulatory Visit: Admitting: Nurse Practitioner

## 2023-12-10 ENCOUNTER — Telehealth: Admitting: Oncology

## 2023-12-10 ENCOUNTER — Telehealth: Payer: Self-pay | Admitting: Oncology

## 2023-12-10 NOTE — Telephone Encounter (Signed)
 PT will not be able to attend telephone appointment

## 2023-12-21 ENCOUNTER — Other Ambulatory Visit

## 2023-12-21 ENCOUNTER — Ambulatory Visit: Admitting: Oncology

## 2023-12-25 ENCOUNTER — Other Ambulatory Visit: Payer: Self-pay | Admitting: Oncology

## 2024-01-25 DEATH — deceased

## 2024-03-17 ENCOUNTER — Ambulatory Visit: Admitting: Family
